# Patient Record
Sex: Female | Born: 1980 | Race: White | Hispanic: No | Smoking: Former smoker
Health system: Southern US, Community
[De-identification: ages and names within clinical notes are randomized; demographics above are authoritative.]

## PROBLEM LIST (undated history)

## (undated) DIAGNOSIS — J40 Bronchitis, not specified as acute or chronic: Secondary | ICD-10-CM

## (undated) DIAGNOSIS — E039 Hypothyroidism, unspecified: Secondary | ICD-10-CM

## (undated) DIAGNOSIS — F329 Major depressive disorder, single episode, unspecified: Secondary | ICD-10-CM

## (undated) DIAGNOSIS — T7840XA Allergy, unspecified, initial encounter: Secondary | ICD-10-CM

## (undated) DIAGNOSIS — R112 Nausea with vomiting, unspecified: Secondary | ICD-10-CM

## (undated) DIAGNOSIS — L309 Dermatitis, unspecified: Secondary | ICD-10-CM

## (undated) DIAGNOSIS — N289 Disorder of kidney and ureter, unspecified: Secondary | ICD-10-CM

## (undated) DIAGNOSIS — E162 Hypoglycemia, unspecified: Secondary | ICD-10-CM

## (undated) DIAGNOSIS — I499 Cardiac arrhythmia, unspecified: Secondary | ICD-10-CM

## (undated) DIAGNOSIS — Z9889 Other specified postprocedural states: Secondary | ICD-10-CM

## (undated) DIAGNOSIS — F32A Depression, unspecified: Secondary | ICD-10-CM

## (undated) DIAGNOSIS — Z8489 Family history of other specified conditions: Secondary | ICD-10-CM

## (undated) HISTORY — PX: ABDOMINAL HYSTERECTOMY: SHX81

## (undated) HISTORY — DX: Dermatitis, unspecified: L30.9

## (undated) HISTORY — PX: CHOLECYSTECTOMY: SHX55

## (undated) HISTORY — DX: Allergy, unspecified, initial encounter: T78.40XA

## (undated) HISTORY — PX: TONSILLECTOMY: SUR1361

## (undated) HISTORY — PX: TONSILLECTOMY AND ADENOIDECTOMY: SUR1326

## (undated) HISTORY — PX: TYMPANOSTOMY TUBE PLACEMENT: SHX32

## (undated) HISTORY — DX: Hypothyroidism, unspecified: E03.9

## (undated) HISTORY — DX: Depression, unspecified: F32.A

## (undated) HISTORY — PX: ADENOIDECTOMY: SUR15

---
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
## 1999-08-13 ENCOUNTER — Encounter: Payer: Self-pay | Admitting: Specialist

## 1999-08-13 ENCOUNTER — Ambulatory Visit (HOSPITAL_COMMUNITY): Admission: RE | Admit: 1999-08-13 | Discharge: 1999-08-13 | Payer: Self-pay | Admitting: Specialist

## 2000-11-19 ENCOUNTER — Inpatient Hospital Stay (HOSPITAL_COMMUNITY): Admission: AD | Admit: 2000-11-19 | Discharge: 2000-11-19 | Payer: Self-pay | Admitting: Obstetrics

## 2001-02-08 ENCOUNTER — Inpatient Hospital Stay (HOSPITAL_COMMUNITY): Admission: AD | Admit: 2001-02-08 | Discharge: 2001-02-10 | Payer: Self-pay | Admitting: Obstetrics & Gynecology

## 2001-02-09 ENCOUNTER — Encounter: Payer: Self-pay | Admitting: *Deleted

## 2001-03-10 ENCOUNTER — Inpatient Hospital Stay (HOSPITAL_COMMUNITY): Admission: AD | Admit: 2001-03-10 | Discharge: 2001-03-10 | Payer: Self-pay | Admitting: Obstetrics

## 2002-01-11 ENCOUNTER — Emergency Department (HOSPITAL_COMMUNITY): Admission: EM | Admit: 2002-01-11 | Discharge: 2002-01-11 | Payer: Self-pay | Admitting: Emergency Medicine

## 2002-09-03 ENCOUNTER — Other Ambulatory Visit: Admission: RE | Admit: 2002-09-03 | Discharge: 2002-09-03 | Payer: Self-pay | Admitting: Obstetrics and Gynecology

## 2002-10-17 ENCOUNTER — Ambulatory Visit (HOSPITAL_COMMUNITY): Admission: RE | Admit: 2002-10-17 | Discharge: 2002-10-17 | Payer: Self-pay | Admitting: Obstetrics and Gynecology

## 2002-10-17 ENCOUNTER — Encounter: Payer: Self-pay | Admitting: Obstetrics and Gynecology

## 2002-11-04 ENCOUNTER — Inpatient Hospital Stay (HOSPITAL_COMMUNITY): Admission: AD | Admit: 2002-11-04 | Discharge: 2002-11-04 | Payer: Self-pay | Admitting: Obstetrics and Gynecology

## 2002-11-09 ENCOUNTER — Inpatient Hospital Stay (HOSPITAL_COMMUNITY): Admission: AD | Admit: 2002-11-09 | Discharge: 2002-11-09 | Payer: Self-pay | Admitting: Obstetrics and Gynecology

## 2002-12-07 ENCOUNTER — Inpatient Hospital Stay (HOSPITAL_COMMUNITY): Admission: AD | Admit: 2002-12-07 | Discharge: 2002-12-08 | Payer: Self-pay | Admitting: Obstetrics and Gynecology

## 2002-12-26 ENCOUNTER — Inpatient Hospital Stay (HOSPITAL_COMMUNITY): Admission: AD | Admit: 2002-12-26 | Discharge: 2002-12-26 | Payer: Self-pay | Admitting: Obstetrics and Gynecology

## 2002-12-27 ENCOUNTER — Inpatient Hospital Stay (HOSPITAL_COMMUNITY): Admission: AD | Admit: 2002-12-27 | Discharge: 2002-12-27 | Payer: Self-pay | Admitting: Obstetrics and Gynecology

## 2003-01-02 ENCOUNTER — Inpatient Hospital Stay (HOSPITAL_COMMUNITY): Admission: AD | Admit: 2003-01-02 | Discharge: 2003-01-02 | Payer: Self-pay | Admitting: Obstetrics and Gynecology

## 2003-01-09 ENCOUNTER — Inpatient Hospital Stay (HOSPITAL_COMMUNITY): Admission: AD | Admit: 2003-01-09 | Discharge: 2003-01-09 | Payer: Self-pay | Admitting: Obstetrics and Gynecology

## 2003-01-12 ENCOUNTER — Inpatient Hospital Stay (HOSPITAL_COMMUNITY): Admission: AD | Admit: 2003-01-12 | Discharge: 2003-01-12 | Payer: Self-pay | Admitting: Obstetrics and Gynecology

## 2003-01-19 ENCOUNTER — Inpatient Hospital Stay (HOSPITAL_COMMUNITY): Admission: AD | Admit: 2003-01-19 | Discharge: 2003-01-19 | Payer: Self-pay | Admitting: Obstetrics and Gynecology

## 2003-01-23 ENCOUNTER — Inpatient Hospital Stay (HOSPITAL_COMMUNITY): Admission: AD | Admit: 2003-01-23 | Discharge: 2003-01-23 | Payer: Self-pay | Admitting: Obstetrics and Gynecology

## 2003-01-28 ENCOUNTER — Inpatient Hospital Stay (HOSPITAL_COMMUNITY): Admission: AD | Admit: 2003-01-28 | Discharge: 2003-01-28 | Payer: Self-pay | Admitting: Obstetrics and Gynecology

## 2003-02-03 ENCOUNTER — Inpatient Hospital Stay (HOSPITAL_COMMUNITY): Admission: AD | Admit: 2003-02-03 | Discharge: 2003-02-03 | Payer: Self-pay | Admitting: Obstetrics and Gynecology

## 2003-02-04 ENCOUNTER — Inpatient Hospital Stay (HOSPITAL_COMMUNITY): Admission: AD | Admit: 2003-02-04 | Discharge: 2003-02-04 | Payer: Self-pay | Admitting: Obstetrics and Gynecology

## 2003-02-06 ENCOUNTER — Inpatient Hospital Stay (HOSPITAL_COMMUNITY): Admission: AD | Admit: 2003-02-06 | Discharge: 2003-02-06 | Payer: Self-pay | Admitting: Obstetrics and Gynecology

## 2003-02-26 ENCOUNTER — Inpatient Hospital Stay (HOSPITAL_COMMUNITY): Admission: AD | Admit: 2003-02-26 | Discharge: 2003-02-26 | Payer: Self-pay | Admitting: Obstetrics and Gynecology

## 2003-03-02 ENCOUNTER — Inpatient Hospital Stay (HOSPITAL_COMMUNITY): Admission: AD | Admit: 2003-03-02 | Discharge: 2003-03-02 | Payer: Self-pay | Admitting: Obstetrics and Gynecology

## 2003-03-09 ENCOUNTER — Ambulatory Visit (HOSPITAL_COMMUNITY): Admission: RE | Admit: 2003-03-09 | Discharge: 2003-03-09 | Payer: Self-pay | Admitting: Obstetrics and Gynecology

## 2003-03-10 ENCOUNTER — Inpatient Hospital Stay (HOSPITAL_COMMUNITY): Admission: AD | Admit: 2003-03-10 | Discharge: 2003-03-12 | Payer: Self-pay | Admitting: Obstetrics and Gynecology

## 2003-03-15 ENCOUNTER — Encounter: Admission: RE | Admit: 2003-03-15 | Discharge: 2003-04-14 | Payer: Self-pay | Admitting: Obstetrics and Gynecology

## 2003-03-17 ENCOUNTER — Emergency Department (HOSPITAL_COMMUNITY): Admission: EM | Admit: 2003-03-17 | Discharge: 2003-03-18 | Payer: Self-pay | Admitting: Emergency Medicine

## 2003-03-22 ENCOUNTER — Emergency Department (HOSPITAL_COMMUNITY): Admission: EM | Admit: 2003-03-22 | Discharge: 2003-03-22 | Payer: Self-pay | Admitting: Emergency Medicine

## 2003-03-25 ENCOUNTER — Ambulatory Visit (HOSPITAL_COMMUNITY): Admission: RE | Admit: 2003-03-25 | Discharge: 2003-03-25 | Payer: Self-pay | Admitting: Obstetrics and Gynecology

## 2003-11-06 ENCOUNTER — Other Ambulatory Visit: Admission: RE | Admit: 2003-11-06 | Discharge: 2003-11-06 | Payer: Self-pay | Admitting: Obstetrics and Gynecology

## 2004-03-06 ENCOUNTER — Emergency Department (HOSPITAL_COMMUNITY): Admission: EM | Admit: 2004-03-06 | Discharge: 2004-03-07 | Payer: Self-pay | Admitting: Emergency Medicine

## 2004-03-07 ENCOUNTER — Encounter (INDEPENDENT_AMBULATORY_CARE_PROVIDER_SITE_OTHER): Payer: Self-pay | Admitting: *Deleted

## 2004-06-21 ENCOUNTER — Other Ambulatory Visit: Admission: RE | Admit: 2004-06-21 | Discharge: 2004-06-21 | Payer: Self-pay | Admitting: Obstetrics and Gynecology

## 2004-07-14 ENCOUNTER — Emergency Department (HOSPITAL_COMMUNITY): Admission: EM | Admit: 2004-07-14 | Discharge: 2004-07-14 | Payer: Self-pay | Admitting: Emergency Medicine

## 2004-08-29 ENCOUNTER — Emergency Department (HOSPITAL_COMMUNITY): Admission: EM | Admit: 2004-08-29 | Discharge: 2004-08-30 | Payer: Self-pay | Admitting: Emergency Medicine

## 2004-08-31 ENCOUNTER — Ambulatory Visit: Payer: Self-pay | Admitting: Internal Medicine

## 2004-12-01 ENCOUNTER — Other Ambulatory Visit: Admission: RE | Admit: 2004-12-01 | Discharge: 2004-12-01 | Payer: Self-pay | Admitting: Obstetrics and Gynecology

## 2005-01-11 ENCOUNTER — Emergency Department (HOSPITAL_COMMUNITY): Admission: EM | Admit: 2005-01-11 | Discharge: 2005-01-11 | Payer: Self-pay | Admitting: Family Medicine

## 2005-01-31 ENCOUNTER — Ambulatory Visit: Payer: Self-pay | Admitting: Internal Medicine

## 2005-07-24 ENCOUNTER — Ambulatory Visit (HOSPITAL_COMMUNITY): Admission: RE | Admit: 2005-07-24 | Discharge: 2005-07-24 | Payer: Self-pay | Admitting: Internal Medicine

## 2005-12-05 ENCOUNTER — Other Ambulatory Visit: Admission: RE | Admit: 2005-12-05 | Discharge: 2005-12-05 | Payer: Self-pay | Admitting: Obstetrics and Gynecology

## 2006-01-22 ENCOUNTER — Ambulatory Visit: Payer: Self-pay | Admitting: Internal Medicine

## 2006-06-05 ENCOUNTER — Encounter (INDEPENDENT_AMBULATORY_CARE_PROVIDER_SITE_OTHER): Payer: Self-pay | Admitting: *Deleted

## 2006-06-05 ENCOUNTER — Inpatient Hospital Stay (HOSPITAL_COMMUNITY): Admission: AD | Admit: 2006-06-05 | Discharge: 2006-06-06 | Payer: Self-pay | Admitting: Obstetrics and Gynecology

## 2006-06-08 ENCOUNTER — Encounter: Payer: Self-pay | Admitting: Obstetrics and Gynecology

## 2006-06-08 ENCOUNTER — Emergency Department (HOSPITAL_COMMUNITY): Admission: EM | Admit: 2006-06-08 | Discharge: 2006-06-08 | Payer: Self-pay | Admitting: Emergency Medicine

## 2006-08-19 ENCOUNTER — Inpatient Hospital Stay (HOSPITAL_COMMUNITY): Admission: AD | Admit: 2006-08-19 | Discharge: 2006-08-19 | Payer: Self-pay | Admitting: Obstetrics and Gynecology

## 2006-09-21 ENCOUNTER — Inpatient Hospital Stay (HOSPITAL_COMMUNITY): Admission: AD | Admit: 2006-09-21 | Discharge: 2006-09-21 | Payer: Self-pay | Admitting: Obstetrics and Gynecology

## 2006-09-24 ENCOUNTER — Inpatient Hospital Stay (HOSPITAL_COMMUNITY): Admission: AD | Admit: 2006-09-24 | Discharge: 2006-09-25 | Payer: Self-pay | Admitting: Obstetrics and Gynecology

## 2006-10-01 ENCOUNTER — Inpatient Hospital Stay (HOSPITAL_COMMUNITY): Admission: AD | Admit: 2006-10-01 | Discharge: 2006-10-02 | Payer: Self-pay | Admitting: Obstetrics and Gynecology

## 2006-10-01 ENCOUNTER — Inpatient Hospital Stay (HOSPITAL_COMMUNITY): Admission: AD | Admit: 2006-10-01 | Discharge: 2006-10-01 | Payer: Self-pay | Admitting: Obstetrics and Gynecology

## 2006-10-05 ENCOUNTER — Inpatient Hospital Stay (HOSPITAL_COMMUNITY): Admission: AD | Admit: 2006-10-05 | Discharge: 2006-10-05 | Payer: Self-pay | Admitting: Obstetrics and Gynecology

## 2006-10-16 ENCOUNTER — Inpatient Hospital Stay (HOSPITAL_COMMUNITY): Admission: RE | Admit: 2006-10-16 | Discharge: 2006-10-18 | Payer: Self-pay | Admitting: Obstetrics and Gynecology

## 2006-10-21 ENCOUNTER — Inpatient Hospital Stay (HOSPITAL_COMMUNITY): Admission: AD | Admit: 2006-10-21 | Discharge: 2006-10-22 | Payer: Self-pay | Admitting: Obstetrics and Gynecology

## 2006-10-23 ENCOUNTER — Encounter (INDEPENDENT_AMBULATORY_CARE_PROVIDER_SITE_OTHER): Payer: Self-pay | Admitting: *Deleted

## 2006-10-23 ENCOUNTER — Inpatient Hospital Stay (HOSPITAL_COMMUNITY): Admission: AD | Admit: 2006-10-23 | Discharge: 2006-10-24 | Payer: Self-pay | Admitting: Obstetrics and Gynecology

## 2007-01-01 ENCOUNTER — Encounter: Payer: Self-pay | Admitting: Internal Medicine

## 2007-01-01 ENCOUNTER — Ambulatory Visit: Payer: Self-pay | Admitting: Internal Medicine

## 2007-01-01 DIAGNOSIS — J45909 Unspecified asthma, uncomplicated: Secondary | ICD-10-CM | POA: Insufficient documentation

## 2007-01-01 DIAGNOSIS — G43009 Migraine without aura, not intractable, without status migrainosus: Secondary | ICD-10-CM | POA: Insufficient documentation

## 2007-01-01 DIAGNOSIS — F329 Major depressive disorder, single episode, unspecified: Secondary | ICD-10-CM | POA: Insufficient documentation

## 2007-01-01 DIAGNOSIS — F411 Generalized anxiety disorder: Secondary | ICD-10-CM | POA: Insufficient documentation

## 2007-01-01 DIAGNOSIS — F3289 Other specified depressive episodes: Secondary | ICD-10-CM | POA: Insufficient documentation

## 2007-01-01 DIAGNOSIS — D649 Anemia, unspecified: Secondary | ICD-10-CM | POA: Insufficient documentation

## 2007-01-01 DIAGNOSIS — R55 Syncope and collapse: Secondary | ICD-10-CM | POA: Insufficient documentation

## 2007-01-15 ENCOUNTER — Ambulatory Visit: Payer: Self-pay

## 2007-01-15 ENCOUNTER — Encounter: Payer: Self-pay | Admitting: Internal Medicine

## 2007-01-17 ENCOUNTER — Telehealth (INDEPENDENT_AMBULATORY_CARE_PROVIDER_SITE_OTHER): Payer: Self-pay | Admitting: *Deleted

## 2007-03-06 ENCOUNTER — Telehealth (INDEPENDENT_AMBULATORY_CARE_PROVIDER_SITE_OTHER): Payer: Self-pay | Admitting: *Deleted

## 2007-03-11 ENCOUNTER — Emergency Department (HOSPITAL_COMMUNITY): Admission: EM | Admit: 2007-03-11 | Discharge: 2007-03-11 | Payer: Self-pay | Admitting: Emergency Medicine

## 2007-05-27 ENCOUNTER — Emergency Department (HOSPITAL_COMMUNITY): Admission: EM | Admit: 2007-05-27 | Discharge: 2007-05-27 | Payer: Self-pay | Admitting: Emergency Medicine

## 2007-05-29 ENCOUNTER — Ambulatory Visit: Payer: Self-pay | Admitting: Internal Medicine

## 2007-05-29 DIAGNOSIS — G4700 Insomnia, unspecified: Secondary | ICD-10-CM | POA: Insufficient documentation

## 2007-05-29 DIAGNOSIS — G4710 Hypersomnia, unspecified: Secondary | ICD-10-CM | POA: Insufficient documentation

## 2007-05-29 LAB — CONVERTED CEMR LAB
Basophils Relative: 0.8 % (ref 0.0–1.0)
Bilirubin Urine: NEGATIVE
Bilirubin, Direct: 0.1 mg/dL (ref 0.0–0.3)
CO2: 30 meq/L (ref 19–32)
Cholesterol: 196 mg/dL (ref 0–200)
Eosinophils Relative: 2.1 % (ref 0.0–5.0)
GFR calc Af Amer: 129 mL/min
Glucose, Bld: 107 mg/dL — ABNORMAL HIGH (ref 70–99)
HDL: 37.5 mg/dL — ABNORMAL LOW (ref >39.0)
Hemoglobin: 13.1 g/dL (ref 12.0–15.0)
Leukocytes, UA: NEGATIVE
Lymphocytes Relative: 28 % (ref 12.0–46.0)
MCV: 89.9 fL (ref 78.0–100.0)
Monocytes Absolute: 0.5 10*3/uL (ref 0.2–0.7)
Monocytes Relative: 6.1 % (ref 3.0–11.0)
Neutro Abs: 5 10*3/uL (ref 1.4–7.7)
Potassium: 4 meq/L (ref 3.5–5.1)
Specific Gravity, Urine: 1.02 (ref 1.000–1.03)
TSH: 2.22 microintl units/mL (ref 0.35–5.50)
Total Protein, Urine: NEGATIVE mg/dL
Total Protein: 7.5 g/dL (ref 6.0–8.3)
Triglycerides: 196 mg/dL — ABNORMAL HIGH (ref 0–149)
WBC: 8 10*3/uL (ref 4.5–10.5)

## 2007-06-11 ENCOUNTER — Ambulatory Visit: Payer: Self-pay | Admitting: Pulmonary Disease

## 2007-06-15 ENCOUNTER — Emergency Department (HOSPITAL_COMMUNITY): Admission: EM | Admit: 2007-06-15 | Discharge: 2007-06-15 | Payer: Self-pay | Admitting: Emergency Medicine

## 2007-06-17 ENCOUNTER — Encounter: Payer: Self-pay | Admitting: Internal Medicine

## 2007-06-18 ENCOUNTER — Ambulatory Visit (HOSPITAL_COMMUNITY): Admission: RE | Admit: 2007-06-18 | Discharge: 2007-06-18 | Payer: Self-pay | Admitting: Obstetrics and Gynecology

## 2007-07-01 ENCOUNTER — Telehealth: Payer: Self-pay | Admitting: Pulmonary Disease

## 2007-10-03 ENCOUNTER — Emergency Department (HOSPITAL_COMMUNITY): Admission: EM | Admit: 2007-10-03 | Discharge: 2007-10-04 | Payer: Self-pay | Admitting: Emergency Medicine

## 2007-10-04 ENCOUNTER — Telehealth: Payer: Self-pay | Admitting: Internal Medicine

## 2007-10-07 ENCOUNTER — Ambulatory Visit: Payer: Self-pay | Admitting: Cardiovascular Disease

## 2007-10-07 ENCOUNTER — Ambulatory Visit: Payer: Self-pay

## 2007-10-22 ENCOUNTER — Encounter: Payer: Self-pay | Admitting: Cardiovascular Disease

## 2007-10-22 ENCOUNTER — Ambulatory Visit: Payer: Self-pay

## 2008-01-22 ENCOUNTER — Ambulatory Visit: Payer: Self-pay | Admitting: Internal Medicine

## 2008-01-22 DIAGNOSIS — R030 Elevated blood-pressure reading, without diagnosis of hypertension: Secondary | ICD-10-CM | POA: Insufficient documentation

## 2008-01-22 DIAGNOSIS — N3000 Acute cystitis without hematuria: Secondary | ICD-10-CM | POA: Insufficient documentation

## 2008-01-22 DIAGNOSIS — J209 Acute bronchitis, unspecified: Secondary | ICD-10-CM | POA: Insufficient documentation

## 2008-01-22 DIAGNOSIS — M549 Dorsalgia, unspecified: Secondary | ICD-10-CM | POA: Insufficient documentation

## 2008-02-10 ENCOUNTER — Telehealth (INDEPENDENT_AMBULATORY_CARE_PROVIDER_SITE_OTHER): Payer: Self-pay | Admitting: *Deleted

## 2008-02-28 ENCOUNTER — Ambulatory Visit (HOSPITAL_COMMUNITY): Admission: RE | Admit: 2008-02-28 | Discharge: 2008-02-28 | Payer: Self-pay | Admitting: Obstetrics and Gynecology

## 2008-06-11 ENCOUNTER — Ambulatory Visit (HOSPITAL_COMMUNITY): Admission: RE | Admit: 2008-06-11 | Discharge: 2008-06-12 | Payer: Self-pay | Admitting: Obstetrics and Gynecology

## 2008-06-11 ENCOUNTER — Encounter (INDEPENDENT_AMBULATORY_CARE_PROVIDER_SITE_OTHER): Payer: Self-pay | Admitting: Obstetrics and Gynecology

## 2008-07-13 ENCOUNTER — Emergency Department (HOSPITAL_COMMUNITY): Admission: EM | Admit: 2008-07-13 | Discharge: 2008-07-13 | Payer: Self-pay | Admitting: Emergency Medicine

## 2008-11-02 ENCOUNTER — Emergency Department (HOSPITAL_COMMUNITY): Admission: EM | Admit: 2008-11-02 | Discharge: 2008-11-03 | Payer: Self-pay | Admitting: Emergency Medicine

## 2008-12-23 ENCOUNTER — Telehealth (INDEPENDENT_AMBULATORY_CARE_PROVIDER_SITE_OTHER): Payer: Self-pay | Admitting: *Deleted

## 2008-12-23 ENCOUNTER — Ambulatory Visit: Payer: Self-pay | Admitting: Internal Medicine

## 2008-12-23 DIAGNOSIS — IMO0002 Reserved for concepts with insufficient information to code with codable children: Secondary | ICD-10-CM | POA: Insufficient documentation

## 2008-12-23 LAB — CONVERTED CEMR LAB
Blood in Urine, dipstick: NEGATIVE
Ketones, urine, test strip: NEGATIVE
Nitrite: NEGATIVE

## 2008-12-25 ENCOUNTER — Encounter: Admission: RE | Admit: 2008-12-25 | Discharge: 2008-12-25 | Payer: Self-pay | Admitting: Internal Medicine

## 2009-01-01 ENCOUNTER — Encounter: Payer: Self-pay | Admitting: Internal Medicine

## 2009-01-03 ENCOUNTER — Encounter: Admission: RE | Admit: 2009-01-03 | Discharge: 2009-01-03 | Payer: Self-pay | Admitting: Neurosurgery

## 2009-01-26 ENCOUNTER — Encounter: Payer: Self-pay | Admitting: Internal Medicine

## 2009-02-18 ENCOUNTER — Telehealth: Payer: Self-pay | Admitting: Internal Medicine

## 2009-02-18 ENCOUNTER — Emergency Department (HOSPITAL_COMMUNITY): Admission: EM | Admit: 2009-02-18 | Discharge: 2009-02-19 | Payer: Self-pay | Admitting: Emergency Medicine

## 2009-02-18 DIAGNOSIS — Z87448 Personal history of other diseases of urinary system: Secondary | ICD-10-CM | POA: Insufficient documentation

## 2009-02-18 DIAGNOSIS — Z87898 Personal history of other specified conditions: Secondary | ICD-10-CM | POA: Insufficient documentation

## 2009-02-18 DIAGNOSIS — N1330 Unspecified hydronephrosis: Secondary | ICD-10-CM | POA: Insufficient documentation

## 2009-02-19 ENCOUNTER — Telehealth: Payer: Self-pay | Admitting: Internal Medicine

## 2009-02-19 ENCOUNTER — Ambulatory Visit: Payer: Self-pay | Admitting: Internal Medicine

## 2009-02-19 DIAGNOSIS — R197 Diarrhea, unspecified: Secondary | ICD-10-CM | POA: Insufficient documentation

## 2009-02-23 ENCOUNTER — Encounter: Payer: Self-pay | Admitting: Internal Medicine

## 2009-02-25 ENCOUNTER — Telehealth: Payer: Self-pay | Admitting: Internal Medicine

## 2009-02-25 LAB — CONVERTED CEMR LAB: Sed Rate: 21 mm/hr (ref 0–22)

## 2009-03-01 ENCOUNTER — Telehealth: Payer: Self-pay | Admitting: Internal Medicine

## 2009-03-03 ENCOUNTER — Telehealth: Payer: Self-pay | Admitting: Internal Medicine

## 2009-03-04 ENCOUNTER — Ambulatory Visit: Payer: Self-pay | Admitting: Internal Medicine

## 2009-03-08 ENCOUNTER — Encounter: Payer: Self-pay | Admitting: Internal Medicine

## 2009-03-17 ENCOUNTER — Encounter: Payer: Self-pay | Admitting: Internal Medicine

## 2009-03-23 ENCOUNTER — Ambulatory Visit: Payer: Self-pay | Admitting: Internal Medicine

## 2009-03-23 DIAGNOSIS — R062 Wheezing: Secondary | ICD-10-CM | POA: Insufficient documentation

## 2009-03-24 ENCOUNTER — Telehealth: Payer: Self-pay | Admitting: Internal Medicine

## 2009-04-26 ENCOUNTER — Telehealth: Payer: Self-pay | Admitting: Internal Medicine

## 2009-04-26 ENCOUNTER — Ambulatory Visit: Payer: Self-pay | Admitting: Internal Medicine

## 2009-04-26 DIAGNOSIS — R51 Headache: Secondary | ICD-10-CM | POA: Insufficient documentation

## 2009-04-26 DIAGNOSIS — R519 Headache, unspecified: Secondary | ICD-10-CM | POA: Insufficient documentation

## 2009-04-28 ENCOUNTER — Telehealth: Payer: Self-pay | Admitting: Internal Medicine

## 2009-04-28 DIAGNOSIS — R209 Unspecified disturbances of skin sensation: Secondary | ICD-10-CM | POA: Insufficient documentation

## 2009-04-28 DIAGNOSIS — R259 Unspecified abnormal involuntary movements: Secondary | ICD-10-CM | POA: Insufficient documentation

## 2009-04-28 DIAGNOSIS — M79609 Pain in unspecified limb: Secondary | ICD-10-CM | POA: Insufficient documentation

## 2009-05-05 ENCOUNTER — Encounter (INDEPENDENT_AMBULATORY_CARE_PROVIDER_SITE_OTHER): Payer: Self-pay | Admitting: *Deleted

## 2009-05-08 ENCOUNTER — Ambulatory Visit (HOSPITAL_COMMUNITY): Admission: RE | Admit: 2009-05-08 | Discharge: 2009-05-08 | Payer: Self-pay | Admitting: Internal Medicine

## 2009-05-13 ENCOUNTER — Telehealth: Payer: Self-pay | Admitting: Internal Medicine

## 2009-05-29 ENCOUNTER — Emergency Department (HOSPITAL_COMMUNITY): Admission: EM | Admit: 2009-05-29 | Discharge: 2009-05-30 | Payer: Self-pay | Admitting: Emergency Medicine

## 2009-06-08 ENCOUNTER — Encounter: Payer: Self-pay | Admitting: Internal Medicine

## 2009-06-15 ENCOUNTER — Ambulatory Visit: Payer: Self-pay | Admitting: Internal Medicine

## 2009-07-15 ENCOUNTER — Telehealth (INDEPENDENT_AMBULATORY_CARE_PROVIDER_SITE_OTHER): Payer: Self-pay | Admitting: *Deleted

## 2009-07-18 HISTORY — PX: BACK SURGERY: SHX140

## 2009-07-19 ENCOUNTER — Inpatient Hospital Stay (HOSPITAL_COMMUNITY): Admission: RE | Admit: 2009-07-19 | Discharge: 2009-07-21 | Payer: Self-pay | Admitting: Neurosurgery

## 2009-07-27 ENCOUNTER — Emergency Department (HOSPITAL_COMMUNITY): Admission: EM | Admit: 2009-07-27 | Discharge: 2009-07-28 | Payer: Self-pay | Admitting: Emergency Medicine

## 2009-07-29 ENCOUNTER — Encounter: Admission: RE | Admit: 2009-07-29 | Discharge: 2009-07-29 | Payer: Self-pay | Admitting: Neurosurgery

## 2009-07-30 ENCOUNTER — Inpatient Hospital Stay (HOSPITAL_COMMUNITY): Admission: EM | Admit: 2009-07-30 | Discharge: 2009-08-02 | Payer: Self-pay | Admitting: Emergency Medicine

## 2009-08-13 ENCOUNTER — Inpatient Hospital Stay (HOSPITAL_COMMUNITY): Admission: EM | Admit: 2009-08-13 | Discharge: 2009-08-14 | Payer: Self-pay | Admitting: Emergency Medicine

## 2009-10-13 ENCOUNTER — Encounter: Admission: RE | Admit: 2009-10-13 | Discharge: 2009-10-13 | Payer: Self-pay | Admitting: Neurosurgery

## 2009-11-10 ENCOUNTER — Emergency Department (HOSPITAL_COMMUNITY): Admission: EM | Admit: 2009-11-10 | Discharge: 2009-11-11 | Payer: Self-pay | Admitting: Emergency Medicine

## 2009-11-12 ENCOUNTER — Telehealth: Payer: Self-pay | Admitting: Internal Medicine

## 2009-12-09 ENCOUNTER — Telehealth (INDEPENDENT_AMBULATORY_CARE_PROVIDER_SITE_OTHER): Payer: Self-pay

## 2010-01-07 ENCOUNTER — Emergency Department (HOSPITAL_COMMUNITY): Admission: EM | Admit: 2010-01-07 | Discharge: 2010-01-07 | Payer: Self-pay | Admitting: Emergency Medicine

## 2010-03-17 ENCOUNTER — Emergency Department (HOSPITAL_COMMUNITY)
Admission: EM | Admit: 2010-03-17 | Discharge: 2010-03-17 | Payer: Self-pay | Source: Home / Self Care | Admitting: Emergency Medicine

## 2010-03-18 ENCOUNTER — Ambulatory Visit
Admission: RE | Admit: 2010-03-18 | Discharge: 2010-03-18 | Payer: Self-pay | Source: Home / Self Care | Attending: Internal Medicine | Admitting: Internal Medicine

## 2010-03-18 DIAGNOSIS — I959 Hypotension, unspecified: Secondary | ICD-10-CM | POA: Insufficient documentation

## 2010-04-19 NOTE — Progress Notes (Signed)
Summary: Side effects  Phone Note Call from Patient Call back at [phone]   Summary of Call: Patient left message on triage A that she was seen recently and is not doing better. Patient now has tingling in the Right side of her face, right arm pain and involuntary muscle jerks. Please advise. Initial call taken by: Lucious Groves,  April 28, 2009 9:19 AM  Follow-up for Phone Call        will still need to get the MRI results., but I can also refer to neurology as well  Follow-up by: Corwin Levins MD,  April 28, 2009 12:36 PM  Additional Follow-up for Phone Call Additional follow up Details #1::        Patient notified, will wait for call from [facility] and call back if symptoms get any worse. Additional Follow-up by: Lucious Groves,  April 28, 2009 2:04 PM  New Problems: ABNORMAL INVOLUNTARY MOVEMENTS (ICD-781.0) ARM PAIN (ICD-729.5) FACIAL PARESTHESIA (ICD-782.0)   New Problems: ABNORMAL INVOLUNTARY MOVEMENTS (ICD-781.0) ARM PAIN (ICD-729.5) FACIAL PARESTHESIA (ICD-782.0)

## 2010-04-19 NOTE — Assessment & Plan Note (Signed)
Summary: migraine x 4 days / SD   Vital Signs:  Patient profile:   30 year old female Height:      68 inches Weight:      222 pounds BMI:     33.88 O2 Sat:      98 % on Room air Temp:     97.4 degrees F oral Pulse rate:   105 / minute BP sitting:   118 / 80  (left arm) Cuff size:   large  Vitals Entered ByZella Ball Ewing (April 26, 2009 3:03 PM)  O2 Flow:  Room air CC: Migraine, nausea /RE   Primary Care Provider:  Bayard Males  CC:  Migraine and nausea /RE.  History of Present Illness: here with 4 days onset right side headache without her typical aura prior, but assoc with photophobia, severe pain, throbbing and nausea (no vomiting), but also with transient left upper eyelid weakness that seemed to resolve , then RUE weakness that is new, mild and most evident when she tries to pick up her purse;  no fever , trauma, night sweats, change in back or neck pain; vision loss, and Pt denies CP, sob, doe, wheezing, orthopnea, pnd, worsening LE edema, palps, dizziness or syncope No falls or worsening LE weakness, though she feels she has some mild chronic on the RLE due to lumbar disc disease.   Problems Prior to Update: 1)  Headache  (ICD-784.0) 2)  Wheezing  (ICD-786.07) 3)  Diarrhea, Acute, Chronic  (ICD-787.91) 4)  Hx of Hydronephrosis, Right  (ICD-591) 5)  Migraines, Hx of  (ICD-V13.8) 6)  Pyelonephritis, Hx of  (ICD-V13.00) 7)  Lumbar Radiculopathy, Right  (ICD-724.4) 8)  Elevated Blood Pressure Without Diagnosis of Hypertension  (ICD-796.2) 9)  Back Pain  (ICD-724.5) 10)  Asthmatic Bronchitis, Acute  (ICD-466.0) 11)  Obesity, Morbid  (ICD-278.01) 12)  Preventive Health Care  (ICD-V70.0) 13)  Hypersomnia  (ICD-780.54) 14)  Insomnia-sleep Disorder-unspec  (ICD-780.52) 15)  Syncope  (ICD-780.2) 16)  Family History Diabetes 1st Degree Relative  (ICD-V18.0) 17)  Depression  (ICD-311) 18)  Asthma  (ICD-493.90) 19)  Anemia-nos  (ICD-285.9) 20)  Common Migraine   (ICD-346.10) 21)  Anxiety  (ICD-300.00)  Medications Prior to Update: 1)  Alprazolam 0.5 Mg  Tabs (Alprazolam) .Marland Kitchen.. 1 By Mouth Once Daily As Needed 2)  Midrin 325-65-100 Mg  Caps (Apap-Isometheptene-Dichloral) .Marland Kitchen.. 1 By Mouth Two Times A Day Prn 3)  Proair Hfa 108 (90 Base) Mcg/act Aers (Albuterol Sulfate) .... 2 Puffs Qid As Needed 4)  Hydrocodone-Acetaminophen 5-500 Mg Tabs (Hydrocodone-Acetaminophen) .... As Needed 5)  Wellbutrin Xl 300 Mg Xr24h-Tab (Bupropion Hcl) .... Once Daily 6)  Flexeril 5 Mg Tabs (Cyclobenzaprine Hcl) .Marland Kitchen.. 1 By Mouth Three Times A Day As Needed 7)  Bentyl 20 Mg Tabs (Dicyclomine Hcl) .... Take 1 Tablet By Mouth Three Times A Day 8)  Anusol-Hc 25 Mg Supp (Hydrocortisone Acetate) .... One Rectally One At Bedtime 9)  Azithromycin 250 Mg Tabs (Azithromycin) .... 2po Qd For 1 Day, Then 1po Qd For 4days, Then Stop 10)  Hydrocodone-Homatropine 5-1.5 Mg/62ml Syrp (Hydrocodone-Homatropine) .Marland Kitchen.. 1 Tsp By Mouth Q 6 Hrs As Needed Cough 11)  Prednisone 10 Mg Tabs (Prednisone) .... 3po Qd For 3days, Then 2po Qd For 3days, Then 1po Qd For 3days, Then Stop 12)  Albuterol Sulfate 0.63 Mg/41ml Nebu (Albuterol Sulfate) .... Use Asd Hhn Qid As Needed  Current Medications (verified): 1)  Alprazolam 0.5 Mg  Tabs (Alprazolam) .Marland Kitchen.. 1 By Mouth Once Daily  As Needed 2)  Midrin 325-65-100 Mg  Caps (Apap-Isometheptene-Dichloral) .Marland Kitchen.. 1 By Mouth Two Times A Day Prn 3)  Proair Hfa 108 (90 Base) Mcg/act Aers (Albuterol Sulfate) .... 2 Puffs Qid As Needed 4)  Hydrocodone-Acetaminophen 5-500 Mg Tabs (Hydrocodone-Acetaminophen) .... As Needed 5)  Wellbutrin Xl 300 Mg Xr24h-Tab (Bupropion Hcl) .... Once Daily 6)  Flexeril 5 Mg Tabs (Cyclobenzaprine Hcl) .Marland Kitchen.. 1 By Mouth Three Times A Day As Needed 7)  Bentyl 20 Mg Tabs (Dicyclomine Hcl) .... Take 1 Tablet By Mouth Three Times A Day 8)  Prednisone 10 Mg Tabs (Prednisone) .... 3po Qd For 3days, Then 2po Qd For 3days, Then 1po Qd For 3days, Then Stop 9)   Albuterol Sulfate 0.63 Mg/63ml Nebu (Albuterol Sulfate) .... Use Asd Hhn Qid As Needed 10)  Promethazine Hcl 25 Mg Tabs (Promethazine Hcl) .Marland Kitchen.. 1po Q 6 Hrs As Needed Nausea  Allergies (verified): 1)  ! Claritin (Loratadine) 2)  ! Motrin Ib (Ibuprofen) 3)  ! Imitrex (Sumatriptan Succinate) 4)  ! Prozac  Past History:  Past Medical History: Last updated: 02/19/2009 Anxiety migraine Anemia-NOS Asthma Depression hx of stress fx L5  - traumatic lumbar disc disease/lumbar spondylolisthesis PCOS Anal Fissure Chronic Headaches Kidney Stones Obesity Urinary Tract Infection  Past Surgical History: Last updated: 02/19/2009 Cholecystectomy Tonsillectomy Hysterectomy  Social History: Last updated: 02/19/2009 Former Smoker Alcohol use-yes Occupation: Receptionist Daily Caffeine Use Illicit Drug Use - no  Risk Factors: Smoking Status: quit (01/01/2007)  Review of Systems       all otherwise negative per pt -  Physical Exam  General:  alert and overweight-appearing.   Head:  normocephalic and atraumatic.   Eyes:  vision grossly intact, pupils equal, and pupils round.   Ears:  R ear normal and L ear normal.   Nose:  no external deformity and no nasal discharge.   Mouth:  no gingival abnormalities and pharynx pink and moist.   Neck:  supple and no masses.   Lungs:  normal respiratory effort and normal breath sounds.   Heart:  normal rate and regular rhythm.   Abdomen:  soft, non-tender, and normal bowel sounds.   Msk:  no joint tenderness and no joint swelling.   Extremities:  no edema, no erythema  Neurologic:  alert & oriented X3, cranial nerves II-XII intact, strength normal in all extremities, and sensation intact to light touch.  except for 4+ prox and distal RUE and RLE weakness and decreased sens to LT to RLE diffusely   Impression & Recommendations:  Problem # 1:  HEADACHE (ICD-784.0)  Her updated medication list for this problem includes:    Midrin  325-65-100 Mg Caps (Apap-isometheptene-dichloral) .Marland Kitchen... 1 by mouth two times a day prn    Hydrocodone-acetaminophen 5-500 Mg Tabs (Hydrocodone-acetaminophen) .Marland Kitchen... As needed some typical and atypical features - has new RUE weakness in addition to her known previous RLE weakness , as well as recent hx of transient left facial weakness - will need MRI head - r/o cva assoc with migraine or other such as tumor; today for demeraol/phenergan 50/25 mg IM, and refill midrin/phenergan for home as needed   Orders: Radiology Referral (Radiology)  Problem # 2:  BACK PAIN (ICD-724.5)  Her updated medication list for this problem includes:    Hydrocodone-acetaminophen 5-500 Mg Tabs (Hydrocodone-acetaminophen) .Marland Kitchen... As needed    Flexeril 5 Mg Tabs (Cyclobenzaprine hcl) .Marland Kitchen... 1 by mouth three times a day as needed chronic , no change, follow with same meds  Complete Medication List:  1)  Alprazolam 0.5 Mg Tabs (Alprazolam) .Marland Kitchen.. 1 by mouth once daily as needed 2)  Midrin 325-65-100 Mg Caps (Apap-isometheptene-dichloral) .Marland Kitchen.. 1 by mouth two times a day prn 3)  Proair Hfa 108 (90 Base) Mcg/act Aers (Albuterol sulfate) .... 2 puffs qid as needed 4)  Hydrocodone-acetaminophen 5-500 Mg Tabs (Hydrocodone-acetaminophen) .... As needed 5)  Wellbutrin Xl 300 Mg Xr24h-tab (Bupropion hcl) .... Once daily 6)  Flexeril 5 Mg Tabs (Cyclobenzaprine hcl) .Marland Kitchen.. 1 by mouth three times a day as needed 7)  Bentyl 20 Mg Tabs (Dicyclomine hcl) .... Take 1 tablet by mouth three times a day 8)  Prednisone 10 Mg Tabs (Prednisone) .... 3po qd for 3days, then 2po qd for 3days, then 1po qd for 3days, then stop 9)  Albuterol Sulfate 0.63 Mg/27ml Nebu (Albuterol sulfate) .... Use asd hhn qid as needed 10)  Promethazine Hcl 25 Mg Tabs (Promethazine hcl) .Marland Kitchen.. 1po q 6 hrs as needed nausea  Other Orders: Admin of Therapeutic Inj  intramuscular or subcutaneous (16109)  Patient Instructions: 1)  you had the demerol/phenergan shot today 2)   Please take all new medications as prescribed 3)  Continue all previous medications as before this visit 4)  You will be contacted about the referral(s) to: MRI 5)  Please schedule a follow-up appointment as needed, or certainly if any worsening symptoms Prescriptions: PROMETHAZINE HCL 25 MG TABS (PROMETHAZINE HCL) 1po q 6 hrs as needed nausea  #40 x 1   Entered and Authorized by:   Corwin Levins MD   Signed by:   Corwin Levins MD on 04/26/2009   Method used:   Print then Give to Patient   RxID:   [id number] MIDRIN 325-65-100 MG  CAPS (APAP-ISOMETHEPTENE-DICHLORAL) 1 by mouth two times a day prn  #60 x 0   Entered and Authorized by:   Corwin Levins MD   Signed by:   Corwin Levins MD on 04/26/2009   Method used:   Print then Give to Patient   RxID:   [id number]    Medication Administration  Injection # 1:    Medication: Demerol / Phenergan Injection    Diagnosis: MIGRAINES, HX OF (ICD-V13.8)    Route: IM    Site: L thigh    Exp Date: 11/2010    Lot #: [id number]    Mfr: Sander Radon plus    Given by: Zella Ball Ewing (April 26, 2009 3:59 PM)  Orders Added: 1)  Radiology Referral [Radiology] 2)  Admin of Therapeutic Inj  intramuscular or subcutaneous [96372] 3)  Est. Patient Level IV [13244]

## 2010-04-19 NOTE — Consult Note (Signed)
Summary: [facility]  [facility]   Imported By: Sherian Rein 06/11/2009 09:08:13  _____________________________________________________________________  External Attachment:    Type:   Image     Comment:   External Document

## 2010-04-19 NOTE — Assessment & Plan Note (Signed)
Summary: f/u from colonoscopy--ch.    History of Present Illness Visit Type: Follow-up Visit Primary GI MD: Lina Sar MD Primary Provider: Bayard Males Chief Complaint: follow-up colonoscopy  still c/o alternating constipation and diarrhea History of Present Illness:   This is a 30 year old white female with polycystic ovary syndrome who is status post evaluation of diarrhea which was attributed to metformin. The diarrhea stopped after stopping the Metformin. She is now constipated. A colonoscopy in December 2010 was normal with negative random biopsies. She had internal hemorrhoids but has not used any of the suppositories given to her. She is complaining of gas and a weight gain of about 20 pounds.   GI Review of Systems    Reports bloating, nausea, and  weight gain.      Denies abdominal pain, acid reflux, belching, chest pain, dysphagia with liquids, dysphagia with solids, heartburn, loss of appetite, vomiting, vomiting blood, and  weight loss.      Reports constipation, diarrhea, hemorrhoids, and  rectal bleeding.     Denies anal fissure, black tarry stools, change in bowel habit, diverticulosis, fecal incontinence, heme positive stool, irritable bowel syndrome, jaundice, light color stool, liver problems, and  rectal pain.    Current Medications (verified): 1)  Alprazolam 0.5 Mg  Tabs (Alprazolam) .Marland Kitchen.. 1 By Mouth Once Daily As Needed 2)  Midrin 325-65-100 Mg  Caps (Apap-Isometheptene-Dichloral) .Marland Kitchen.. 1 By Mouth Two Times A Day Prn 3)  Proair Hfa 108 (90 Base) Mcg/act Aers (Albuterol Sulfate) .... 2 Puffs Qid As Needed 4)  Hydrocodone-Acetaminophen 5-500 Mg Tabs (Hydrocodone-Acetaminophen) .Marland Kitchen.. 1 By Mouth Two Times A Day As Needed For Pain 5)  Flexeril 5 Mg Tabs (Cyclobenzaprine Hcl) .Marland Kitchen.. 1 By Mouth Three Times A Day As Needed 6)  Bentyl 20 Mg Tabs (Dicyclomine Hcl) .... Take 1 Tablet By Mouth Three Times A Day 7)  Albuterol Sulfate 0.63 Mg/21ml Nebu (Albuterol Sulfate) .... Use Asd  Hhn Qid As Needed 8)  Promethazine Hcl 25 Mg Tabs (Promethazine Hcl) .Marland Kitchen.. 1po Q 6 Hrs As Needed Nausea 9)  Robaxin-750 750 Mg Tabs (Methocarbamol) .Marland Kitchen.. 1po Two Times A Day As Needed 10)  Zoloft 50 Mg Tabs (Sertraline Hcl) .Marland Kitchen.. 1 By Mouth Once Daily  Allergies (verified): 1)  ! Claritin (Loratadine) 2)  ! Motrin Ib (Ibuprofen) 3)  ! Imitrex (Sumatriptan Succinate) 4)  ! Prozac  Past History:  Past Medical History: Reviewed history from 02/19/2009 and no changes required. Anxiety migraine Anemia-NOS Asthma Depression hx of stress fx L5  - traumatic lumbar disc disease/lumbar spondylolisthesis PCOS Anal Fissure Chronic Headaches Kidney Stones Obesity Urinary Tract Infection  Past Surgical History: Reviewed history from 02/19/2009 and no changes required. Cholecystectomy Tonsillectomy Hysterectomy  Family History: Reviewed history from 06/11/2007 and no changes required. Family History Diabetes 1st degree relative Family History Hypertension Paternal Grandfather - emphysema Allergies - mother Paternal Grandfather - heart disease Maternal great grandmother - breast cancer Maternal grandfather - liver cancer No FH of Colon Cancer:  Social History: Reviewed history from 02/19/2009 and no changes required. Former Smoker Alcohol use-yes Occupation: Receptionist Daily Caffeine Use Illicit Drug Use - no  Review of Systems  The patient denies allergy/sinus, anemia, anxiety-new, arthritis/joint pain, back pain, blood in urine, breast changes/lumps, change in vision, confusion, cough, coughing up blood, depression-new, fainting, fatigue, fever, headaches-new, hearing problems, heart murmur, heart rhythm changes, itching, menstrual pain, muscle pains/cramps, night sweats, nosebleeds, pregnancy symptoms, shortness of breath, skin rash, sleeping problems, sore throat, swelling of feet/legs, swollen  lymph glands, thirst - excessive , urination - excessive , urination  changes/pain, urine leakage, vision changes, and voice change.         Pertinent positive and negative review of systems were noted in the above HPI. All other ROS was otherwise negative.   Vital Signs:  Patient profile:   30 year old female Height:      68 inches Weight:      233 pounds BMI:     35.56 Pulse rate:   88 / minute Pulse rhythm:   regular BP sitting:   118 / 72  (left arm)  Vitals Entered By: Milford Cage NCMA (June 15, 2009 3:34 PM)   Impression & Recommendations:  Problem # 1:  DIARRHEA, ACUTE, CHRONIC (ICD-787.91) Patient has had  complete resolution of diarrhea upon stopping metformin. She is now constipated. She needs to get back on metformin to control the weight gain. She was started on metformin 875 mg daily. I have given her a booklet on gas producing foods.  Patient Instructions: 1)  probiotics daily p.r.n. 2)  Begin metformin 8 75 mg daily. 3)  Patient's information on gas-forming foods. 4)  Copy sent to : Dr J.John 5)  The medication list was reviewed and reconciled.  All changed / newly prescribed medications were explained.  A complete medication list was provided to the patient / caregiver.

## 2010-04-19 NOTE — Progress Notes (Signed)
Summary: Questions   Phone Note Call from Patient Call back at Home Phone [phone]   Caller: Patient Call For: Dr Juanda Chance Reason for Call: Talk to Nurse Summary of Call: Pt was in [facility] ER on Wed w/RLQ pain & diarrhea.  CT neg, labs were done and they sent her home.  She still has diarrhea & pain.  Please call.  She specifically ask to speak w/Dottie.  Thanks Initial call taken by: Misty Stanley,  November 12, 2009 2:33 PM  Follow-up for Phone Call        Left message for patient to call back Darcey Nora RN, [facility]  November 15, 2009 8:41 AM  All questions answered.  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  She is currently out of work and wants to avoid office visits.  She is using Bentyl  and this is helping.  She asked me to review the ER note with her as she was given no results when she went.  Having continued RLQ pain, but she declines an appointment at this time.  She wants to see her neurosurgeon because she is also having issues from her back surgery.  She has an appointment with him next week.  If he feels her pain is unrelated to her recent back surgery she will call back for an appointment.  In the mean time she continue on Bentyl and imodium Follow-up by: Darcey Nora RN, CGRN,  November 15, 2009 4:06 PM

## 2010-04-19 NOTE — Assessment & Plan Note (Signed)
Summary: sore throat,cough/cd   Vital Signs:  Patient profile:   30 year old female Height:      67 inches Weight:      218 pounds BMI:     34.27 O2 Sat:      98 % on Room air Temp:     99 degrees F oral Pulse rate:   93 / minute BP sitting:   108 / 80  (left arm) Cuff size:   large  Vitals Entered ByMarland Kitchen Zella Ball Ewing (March 23, 2009 2:16 PM)  O2 Flow:  Room air CC: sore throat,cough,chest and nasal congestion/RE   Primary Care Provider:  Bayard Males  CC:  sore throat, cough, and chest and nasal congestion/RE.  History of Present Illness: curretnly off metformin, denies polys, diarrhea has resolved but now constipated she thinks;  here today with overall stable anxiety, denies worsening depressive symptoms or suicidal ideation; but does have acute onset mild to mod ST, fever and cough mild prod, but today with worsening mild wheezing and sob, but no significant doe and Pt denies CP,  orthopnea, pnd, worsening LE edema, palps, dizziness or syncope Pt denies new neuro symptoms such as headache, facial or extremity weakness   Problems Prior to Update: 1)  Wheezing  (ICD-786.07) 2)  Bronchitis-acute  (ICD-466.0) 3)  Diarrhea, Acute, Chronic  (ICD-787.91) 4)  Hx of Hydronephrosis, Right  (ICD-591) 5)  Migraines, Hx of  (ICD-V13.8) 6)  Pyelonephritis, Hx of  (ICD-V13.00) 7)  Lumbar Radiculopathy, Right  (ICD-724.4) 8)  Elevated Blood Pressure Without Diagnosis of Hypertension  (ICD-796.2) 9)  Back Pain  (ICD-724.5) 10)  Asthmatic Bronchitis, Acute  (ICD-466.0) 11)  Obesity, Morbid  (ICD-278.01) 12)  Preventive Health Care  (ICD-V70.0) 13)  Hypersomnia  (ICD-780.54) 14)  Insomnia-sleep Disorder-unspec  (ICD-780.52) 15)  Syncope  (ICD-780.2) 16)  Family History Diabetes 1st Degree Relative  (ICD-V18.0) 17)  Depression  (ICD-311) 18)  Asthma  (ICD-493.90) 19)  Anemia-nos  (ICD-285.9) 20)  Common Migraine  (ICD-346.10) 21)  Anxiety  (ICD-300.00)  Medications Prior to  Update: 1)  Alprazolam 0.5 Mg  Tabs (Alprazolam) .Marland Kitchen.. 1 By Mouth Once Daily As Needed 2)  Midrin 325-65-100 Mg  Caps (Apap-Isometheptene-Dichloral) .Marland Kitchen.. 1 By Mouth Two Times A Day Prn 3)  Proair Hfa 108 (90 Base) Mcg/act Aers (Albuterol Sulfate) .... 2 Puffs Qid As Needed 4)  Hydrocodone-Acetaminophen 5-500 Mg Tabs (Hydrocodone-Acetaminophen) .... As Needed 5)  Metformin Hcl 850 Mg Tabs (Metformin Hcl) .Marland Kitchen.. 1 By Mouth Once Daily 6)  Wellbutrin Xl 300 Mg Xr24h-Tab (Bupropion Hcl) .... Once Daily 7)  Metformin Hcl 500 Mg Tabs (Metformin Hcl) .Marland Kitchen.. 1 Once Daily 8)  Flexeril 5 Mg Tabs (Cyclobenzaprine Hcl) .Marland Kitchen.. 1 By Mouth Three Times A Day As Needed 9)  Bentyl 20 Mg Tabs (Dicyclomine Hcl) .... Take 1 Tablet By Mouth Three Times A Day 10)  Anusol-Hc 25 Mg Supp (Hydrocortisone Acetate) .... One Rectally One At Bedtime  Current Medications (verified): 1)  Alprazolam 0.5 Mg  Tabs (Alprazolam) .Marland Kitchen.. 1 By Mouth Once Daily As Needed 2)  Midrin 325-65-100 Mg  Caps (Apap-Isometheptene-Dichloral) .Marland Kitchen.. 1 By Mouth Two Times A Day Prn 3)  Proair Hfa 108 (90 Base) Mcg/act Aers (Albuterol Sulfate) .... 2 Puffs Qid As Needed 4)  Hydrocodone-Acetaminophen 5-500 Mg Tabs (Hydrocodone-Acetaminophen) .... As Needed 5)  Wellbutrin Xl 300 Mg Xr24h-Tab (Bupropion Hcl) .... Once Daily 6)  Flexeril 5 Mg Tabs (Cyclobenzaprine Hcl) .Marland Kitchen.. 1 By Mouth Three Times A Day As Needed 7)  Bentyl 20 Mg Tabs (Dicyclomine Hcl) .... Take 1 Tablet By Mouth Three Times A Day 8)  Anusol-Hc 25 Mg Supp (Hydrocortisone Acetate) .... One Rectally One At Bedtime 9)  Azithromycin 250 Mg Tabs (Azithromycin) .... 2po Qd For 1 Day, Then 1po Qd For 4days, Then Stop 10)  Hydrocodone-Homatropine 5-1.5 Mg/17ml Syrp (Hydrocodone-Homatropine) .Marland Kitchen.. 1 Tsp By Mouth Q 6 Hrs As Needed Cough 11)  Prednisone 10 Mg Tabs (Prednisone) .... 3po Qd For 3days, Then 2po Qd For 3days, Then 1po Qd For 3days, Then Stop  Allergies (verified): 1)  ! Claritin (Loratadine) 2)   ! Motrin Ib (Ibuprofen) 3)  ! Imitrex (Sumatriptan Succinate) 4)  ! Prozac  Past History:  Past Medical History: Last updated: 02/19/2009 Anxiety migraine Anemia-NOS Asthma Depression hx of stress fx L5  - traumatic lumbar disc disease/lumbar spondylolisthesis PCOS Anal Fissure Chronic Headaches Kidney Stones Obesity Urinary Tract Infection  Past Surgical History: Last updated: 02/19/2009 Cholecystectomy Tonsillectomy Hysterectomy  Social History: Last updated: 02/19/2009 Former Smoker Alcohol use-yes Occupation: Receptionist Daily Caffeine Use Illicit Drug Use - no  Risk Factors: Smoking Status: quit (01/01/2007)  Review of Systems       all otherwise negative per pt -   Physical Exam  General:  alert and overweight-appearing.  , mild ill  Head:  normocephalic and atraumatic.   Eyes:  vision grossly intact, pupils equal, and pupils round.   Ears:  bilat tm's red, sinus nontender Nose:  nasal dischargemucosal pallor and mucosal erythema.   Mouth:  pharyngeal erythema and fair dentition.   Neck:  supple and no masses.   Lungs:  normal respiratory effort, R decreased breath sounds, R wheezes, L decreased breath sounds, and L wheezes.   Heart:  normal rate and regular rhythm.   Extremities:  no edema, no erythema    Impression & Recommendations:  Problem # 1:  BRONCHITIS-ACUTE (ICD-466.0)  Her updated medication list for this problem includes:    Proair Hfa 108 (90 Base) Mcg/act Aers (Albuterol sulfate) .Marland Kitchen... 2 puffs qid as needed    Azithromycin 250 Mg Tabs (Azithromycin) .Marland Kitchen... 2po qd for 1 day, then 1po qd for 4days, then stop    Hydrocodone-homatropine 5-1.5 Mg/19ml Syrp (Hydrocodone-homatropine) .Marland Kitchen... 1 tsp by mouth q 6 hrs as needed cough treat as above, f/u any worsening signs or symptoms   Problem # 2:  WHEEZING (ICD-786.07)  liekly due to above - for depo shot and prednisone burst and taper off   Orders: Depo- Medrol 40mg  (J1030) Depo-  Medrol 80mg  (J1040) Admin of Therapeutic Inj  intramuscular or subcutaneous ([address])  Problem # 3:  ANXIETY (ICD-300.00)  Her updated medication list for this problem includes:    Alprazolam 0.5 Mg Tabs (Alprazolam) .Marland Kitchen... 1 by mouth once daily as needed    Wellbutrin Xl 300 Mg Xr24h-tab (Bupropion hcl) ..... Once daily stable overall by hx and exam, ok to continue meds/tx as is   Problem # 4:  DIARRHEA, ACUTE, CHRONIC (ICD-787.91) improved, to f/u GI as planned  Complete Medication List: 1)  Alprazolam 0.5 Mg Tabs (Alprazolam) .Marland Kitchen.. 1 by mouth once daily as needed 2)  Midrin 325-65-100 Mg Caps (Apap-isometheptene-dichloral) .Marland Kitchen.. 1 by mouth two times a day prn 3)  Proair Hfa 108 (90 Base) Mcg/act Aers (Albuterol sulfate) .... 2 puffs qid as needed 4)  Hydrocodone-acetaminophen 5-500 Mg Tabs (Hydrocodone-acetaminophen) .... As needed 5)  Wellbutrin Xl 300 Mg Xr24h-tab (Bupropion hcl) .... Once daily 6)  Flexeril 5 Mg Tabs (Cyclobenzaprine hcl) .Marland KitchenMarland KitchenMarland Kitchen  1 by mouth three times a day as needed 7)  Bentyl 20 Mg Tabs (Dicyclomine hcl) .... Take 1 tablet by mouth three times a day 8)  Anusol-hc 25 Mg Supp (Hydrocortisone acetate) .... One rectally one at bedtime 9)  Azithromycin 250 Mg Tabs (Azithromycin) .... 2po qd for 1 day, then 1po qd for 4days, then stop 10)  Hydrocodone-homatropine 5-1.5 Mg/77ml Syrp (Hydrocodone-homatropine) .Marland Kitchen.. 1 tsp by mouth q 6 hrs as needed cough 11)  Prednisone 10 Mg Tabs (Prednisone) .... 3po qd for 3days, then 2po qd for 3days, then 1po qd for 3days, then stop  Patient Instructions: 1)  you had the steroid shot today 2)  Please take all new medications as prescribed 3)  Continue all previous medications as before this visit  4)  Please keep your appt with GI as planned 5)  Please schedule a follow-up appointment as needed. Prescriptions: PREDNISONE 10 MG TABS (PREDNISONE) 3po qd for 3days, then 2po qd for 3days, then 1po qd for 3days, then stop  #18 x 0   Entered  and Authorized by:   Corwin Levins MD   Signed by:   Corwin Levins MD on 03/23/2009   Method used:   Print then Give to Patient   RxID:   [id number] HYDROCODONE-HOMATROPINE 5-1.5 MG/5ML SYRP (HYDROCODONE-HOMATROPINE) 1 tsp by mouth q 6 hrs as needed cough  #6 oz x 1   Entered and Authorized by:   Corwin Levins MD   Signed by:   Corwin Levins MD on 03/23/2009   Method used:   Print then Give to Patient   RxID:   [id number] AZITHROMYCIN 250 MG TABS (AZITHROMYCIN) 2po qd for 1 day, then 1po qd for 4days, then stop  #6 x 1   Entered and Authorized by:   Corwin Levins MD   Signed by:   Corwin Levins MD on 03/23/2009   Method used:   Print then Give to Patient   RxID:   [id number]    Medication Administration  Injection # 1:    Medication: Depo- Medrol 40mg     Diagnosis: WHEEZING (ICD-786.07)    Route: IM    Site: LUOQ gluteus    Exp Date: 12/2009    Lot #: [id number]    Mfr: Teva    Given by: Scharlene Gloss (March 23, 2009 3:22 PM)  Injection # 2:    Medication: Depo- Medrol 80mg     Diagnosis: WHEEZING (ICD-786.07)    Route: IM    Site: LUOQ gluteus    Exp Date: 12/2009    Lot #: [id number]    Mfr: Teva    Given by: Zella Ball Ewing (March 23, 2009 3:22 PM)  Orders Added: 1)  Depo- Medrol 40mg  [J1030] 2)  Depo- Medrol 80mg  [J1040] 3)  Admin of Therapeutic Inj  intramuscular or subcutaneous [96372] 4)  Est. Patient Level IV [42595]

## 2010-04-19 NOTE — Progress Notes (Signed)
Summary: Nausea  Medications Added ZOFRAN 4 MG TABS (ONDANSETRON HCL) 1 by mouth q 6-8 hours       Phone Note Call from Patient Call back at Home Phone [phone]   Caller: pt  Call For: Dr Juanda Chance Summary of Call: Patient  called requesting a rx for zofran.  She has some bladder issues that are causeing her nausea.  She has an appointment with her GYN on Monday.  She has phenergan but this makes her sleepy.  She is requesting zofran for her nasuea.  Still has occasional diarrhea.  Would like this called into [facility].    Follow-up for Phone Call        she has polycystic ovary disease followed by a GYN. OK to send Zofran 4mg ,#10, 1 by mouth q6-8 hrs as needed nausea, no refill, one time only Follow-up by: Hart Carwin MD,  December 09, 2009 4:36 PM    New/Updated Medications: ZOFRAN 4 MG TABS (ONDANSETRON HCL) 1 by mouth q 6-8 hours Prescriptions: ZOFRAN 4 MG TABS (ONDANSETRON HCL) 1 by mouth q 6-8 hours  #10 x 0   Entered by:   Darcey Nora RN, CGRN   Authorized by:   Hart Carwin MD   Signed by:   Darcey Nora RN, CGRN on 12/09/2009   Method used:   Faxed to ...       [facility]* (retail)       [address] [place], [place]  [address]       Ph: [phone] or [phone]       Fax: [phone]   RxID:   [id number]

## 2010-04-19 NOTE — Progress Notes (Signed)
Summary: med refill  Phone Note Call from Patient   Caller: Patient Summary of Call: Angie Brown requested a refill on her Flexeril and Vicodin.  She uses [facility]  Initial call taken by: Scharlene Gloss,  April 26, 2009 4:48 PM  Follow-up for Phone Call        done hardcopy to LIM side B - dahlia  Follow-up by: Corwin Levins MD,  April 26, 2009 5:23 PM  Additional Follow-up for Phone Call Additional follow up Details #1::        RX faxed. Left message on machine to call back to office. Additional Follow-up by: Lucious Groves,  April 27, 2009 8:19 AM    Additional Follow-up for Phone Call Additional follow up Details #2::    pt informed to check with Pharmacy Follow-up by: Margaret Pyle, CMA,  April 27, 2009 9:26 AM  New/Updated Medications: HYDROCODONE-ACETAMINOPHEN 5-500 MG TABS (HYDROCODONE-ACETAMINOPHEN) 1 by mouth two times a day as needed for pain FLEXERIL 5 MG TABS (CYCLOBENZAPRINE HCL) 1 by mouth three times a day as needed Prescriptions: HYDROCODONE-ACETAMINOPHEN 5-500 MG TABS (HYDROCODONE-ACETAMINOPHEN) 1 by mouth two times a day as needed for pain  #60 x 1   Entered and Authorized by:   Corwin Levins MD   Signed by:   Corwin Levins MD on 04/26/2009   Method used:   Print then Give to Patient   RxID:   [id number] FLEXERIL 5 MG TABS (CYCLOBENZAPRINE HCL) 1 by mouth three times a day as needed  #60 x 1   Entered and Authorized by:   Corwin Levins MD   Signed by:   Corwin Levins MD on 04/26/2009   Method used:   Print then Give to Patient   RxID:   [id number]

## 2010-04-19 NOTE — Letter (Signed)
Summary: [facility] Consult Scheduled Letter  [facility]  [address] [place], [place] [address]   Phone: [phone]  Fax: [phone]      05/05/2009 MRN: [id number]  [facility] [address] [place], [place]  [address]    Dear Ms. Angie Brown,      We have scheduled an appointment for you.  At the recommendation of Dr.John, we have scheduled you a consult with Dr Terrace Arabia on 06/02/09 at 1:30pm.  Their phone number is [phone].  If this appointment day and time is not convenient for you, please feel free to call the office of the doctor you are being referred to at the number listed above and reschedule the appointment.    [facility] [address] [place], [place] [address]   *Please arrive 30 minutes prior to appointment.*   Thank you,  Patient Care Coordinator [facility]

## 2010-04-19 NOTE — Progress Notes (Signed)
   Julieta Gutting over to Debra/[facility] [phone] [facility]  July 15, 2009 1:08 PM

## 2010-04-19 NOTE — Progress Notes (Signed)
Summary: Fleseril request  Phone Note Call from Patient Call back at [phone] or [phone]   Caller: Patient Call For: Corwin Levins MD Summary of Call: Pt pulled muscle in neck, requesting Flexeril. I said I could work her in for appt today. She is not feeling well and wants to go home. If she is not feeling better she will call and sched appt for tomorrow but requesting medicine I told her I would relay the message but did not think meds would be called in. Please advise. Initial call taken by: Verdell Face,  May 13, 2009 10:09 AM  Follow-up for Phone Call        ok this time for as needed flexeril - done hardcopy to LIM side B - dahlia  Follow-up by: Corwin Levins MD,  May 13, 2009 1:16 PM  Additional Follow-up for Phone Call Additional follow up Details #1::        pt states she has some of the flexeril 5mg  and they don't seem to be helping? Pt would like to know if there can be something stronger sent in? If not pt still would like Flexeril sent in because she only has a couple? Pt would like them sent to [facility]? Please advise? Additional Follow-up by: Josph Macho RMA,  May 13, 2009 2:45 PM    Additional Follow-up for Phone Call Additional follow up Details #2::    change to robaxin as needed - done hardcopy to LIM side B - dahlia  Follow-up by: Corwin Levins MD,  May 13, 2009 3:08 PM  Additional Follow-up for Phone Call Additional follow up Details #3:: Details for Additional Follow-up Action Taken: Faxed and I informed pt. Additional Follow-up by: Josph Macho RMA,  May 13, 2009 4:44 PM  New/Updated Medications: FLEXERIL 5 MG TABS (CYCLOBENZAPRINE HCL) 1 by mouth three times a day as needed ROBAXIN-750 750 MG TABS (METHOCARBAMOL) 1po two times a day as needed Prescriptions: ROBAXIN-750 750 MG TABS (METHOCARBAMOL) 1po two times a day as needed  #60 x 0   Entered and Authorized by:   Corwin Levins MD   Signed by:   Corwin Levins MD on  05/13/2009   Method used:   Print then Give to Patient   RxID:   [id number] FLEXERIL 5 MG TABS (CYCLOBENZAPRINE HCL) 1 by mouth three times a day as needed  #40 x 1   Entered and Authorized by:   Corwin Levins MD   Signed by:   Corwin Levins MD on 05/13/2009   Method used:   Print then Give to Patient   RxID:   [id number]

## 2010-04-19 NOTE — Progress Notes (Signed)
Summary: PT NEED RX  Phone Note Call from Patient   Reason for Call: Talk to Nurse Summary of Call: PATIENT WANTS TO KNOW IF SHE CAN GET HER ALBUTEROL CALLED IN RIGHT AWAY. PT STATED SHE SAW DR. Jonny Ruiz ON YESTERDAY. PATIENT IS GETTING NO RELIEF FROM INHALER. HAVING DIFFICULTY BREATHING. PT WANTS RX CALLED INTO [facility] ON [address] [phone].  PT CALL BACK NUMBER IS [phone]. PT NEEDS NEW MASK AND TUBING AS WELL. Initial call taken by: Daphane Shepherd,  March 24, 2009 2:30 PM  Follow-up for Phone Call        pt is requesting albuterol neb treatment that are not on med list. okay to add and fill? Follow-up by: Margaret Pyle, CMA,  March 24, 2009 2:41 PM  Additional Follow-up for Phone Call Additional follow up Details #1::        ok for rx, but she should consider going to ER as it very unlikely this can be done today and it sounds as if she is not responding to the tx as per 1/4; I have never prescribed the mask and tubing so I am not sure how to do that Additional Follow-up by: Corwin Levins MD,  March 24, 2009 3:56 PM    Additional Follow-up for Phone Call Additional follow up Details #2::    pt informed, rx faxed to pharmacy per pt request Follow-up by: Margaret Pyle, CMA,  March 24, 2009 4:12 PM  New/Updated Medications: ALBUTEROL SULFATE 0.63 MG/3ML NEBU (ALBUTEROL SULFATE) use asd HHN qid as needed Prescriptions: ALBUTEROL SULFATE 0.63 MG/3ML NEBU (ALBUTEROL SULFATE) use asd HHN qid as needed  #120 x 1   Entered and Authorized by:   Corwin Levins MD   Signed by:   Corwin Levins MD on 03/24/2009   Method used:   Print then Give to Patient   RxID:   [id number]

## 2010-04-19 NOTE — Progress Notes (Signed)
Summary: Migraine  Phone Note Call from Patient   Summary of Call: Patient is requesting rx for migraine that she has had x 4 days. She has been taking her "pain pill" and is out of phenergan but has gotten no relief. Scheduled for office visit today for eval. Initial call taken by: Lamar Sprinkles, CMA,  April 26, 2009 8:28 AM

## 2010-04-21 NOTE — Assessment & Plan Note (Signed)
Summary: post er/can barely talk/cd   Vital Signs:  Patient profile:   30 year old female Height:      67.5 inches Weight:      218 pounds BMI:     33.76 O2 Sat:      97 % on Room air Temp:     98.1 degrees F oral Pulse rate:   72 / minute BP sitting:   82 / 60  (left arm) Cuff size:   large  Vitals Entered By: Zella Ball Ewing CMA Duncan Dull) (March 18, 2010 3:47 PM)  O2 Flow:  Room air CC: post ER/RE   Primary Care Provider:  Bayard Males  CC:  post ER/RE.  History of Present Illness: here in f/u from ER visit yesterday , seen for fever, hoarseness and wheezing, bronchitis - tx with prednisone high dose but has not actually had the prescription filled yet;  has significan anxiety attack this am, but better now;  symptoms overall about the same except seems cough is now more producitve adn ST seems worse;  wheezing with sob/doe about the same;  Pt denies CP, orthopnea, pnd, worsening LE edema, palps, dizziness or syncope .  Pt denies new neuro symptoms such as facial or extremity weakness  Pt denies polydipsia, polyuria  Overall o/w good compliance with meds. Has marked headache as well due to the coughing with pounding bifrontal, mod to severe, but without blurred vision, n/v, worse with exertion, confusion.  Feels overall signficiant general weakness and malaise, but taking by mouth well, and no falls.  No rash.    Problems Prior to Update: 1)  Wheezing  (ICD-786.07) 2)  Abnormal Involuntary Movements  (ICD-781.0) 3)  Arm Pain  (ICD-729.5) 4)  Facial Paresthesia  (ICD-782.0) 5)  Headache  (ICD-784.0) 6)  Wheezing  (ICD-786.07) 7)  Diarrhea, Acute, Chronic  (ICD-787.91) 8)  Hx of Hydronephrosis, Right  (ICD-591) 9)  Migraines, Hx of  (ICD-V13.8) 10)  Pyelonephritis, Hx of  (ICD-V13.00) 11)  Lumbar Radiculopathy, Right  (ICD-724.4) 12)  Elevated Blood Pressure Without Diagnosis of Hypertension  (ICD-796.2) 13)  Back Pain  (ICD-724.5) 14)  Asthmatic Bronchitis, Acute   (ICD-466.0) 15)  Obesity, Morbid  (ICD-278.01) 16)  Preventive Health Care  (ICD-V70.0) 17)  Hypersomnia  (ICD-780.54) 18)  Insomnia-sleep Disorder-unspec  (ICD-780.52) 19)  Syncope  (ICD-780.2) 20)  Family History Diabetes 1st Degree Relative  (ICD-V18.0) 21)  Depression  (ICD-311) 22)  Asthma  (ICD-493.90) 23)  Anemia-nos  (ICD-285.9) 24)  Common Migraine  (ICD-346.10) 25)  Anxiety  (ICD-300.00)  Medications Prior to Update: 1)  Alprazolam 0.5 Mg  Tabs (Alprazolam) .Marland Kitchen.. 1 By Mouth Once Daily As Needed 2)  Midrin 325-65-100 Mg  Caps (Apap-Isometheptene-Dichloral) .Marland Kitchen.. 1 By Mouth Two Times A Day Prn 3)  Proair Hfa 108 (90 Base) Mcg/act Aers (Albuterol Sulfate) .... 2 Puffs Qid As Needed 4)  Hydrocodone-Acetaminophen 5-500 Mg Tabs (Hydrocodone-Acetaminophen) .Marland Kitchen.. 1 By Mouth Two Times A Day As Needed For Pain 5)  Flexeril 5 Mg Tabs (Cyclobenzaprine Hcl) .Marland Kitchen.. 1 By Mouth Three Times A Day As Needed 6)  Bentyl 20 Mg Tabs (Dicyclomine Hcl) .... Take 1 Tablet By Mouth Three Times A Day 7)  Albuterol Sulfate 0.63 Mg/46ml Nebu (Albuterol Sulfate) .... Use Asd Hhn Qid As Needed 8)  Promethazine Hcl 25 Mg Tabs (Promethazine Hcl) .Marland Kitchen.. 1po Q 6 Hrs As Needed Nausea 9)  Robaxin-750 750 Mg Tabs (Methocarbamol) .Marland Kitchen.. 1po Two Times A Day As Needed 10)  Zoloft 50 Mg Tabs (Sertraline  Hcl) .... 1 By Mouth Once Daily 11)  Zofran 4 Mg Tabs (Ondansetron Hcl) .Marland Kitchen.. 1 By Mouth Q 6-8 Hours  Current Medications (verified): 1)  Alprazolam 0.5 Mg  Tabs (Alprazolam) .Marland Kitchen.. 1 By Mouth Once Daily As Needed 2)  Midrin 325-65-100 Mg  Caps (Apap-Isometheptene-Dichloral) .Marland Kitchen.. 1 By Mouth Two Times A Day Prn 3)  Proair Hfa 108 (90 Base) Mcg/act Aers (Albuterol Sulfate) .... 2 Puffs Qid As Needed 4)  Hydrocodone-Acetaminophen 5-500 Mg Tabs (Hydrocodone-Acetaminophen) .Marland Kitchen.. 1 By Mouth Two Times A Day As Needed For Pain 5)  Flexeril 5 Mg Tabs (Cyclobenzaprine Hcl) .Marland Kitchen.. 1 By Mouth Three Times A Day As Needed 6)  Bentyl 20 Mg Tabs  (Dicyclomine Hcl) .... Take 1 Tablet By Mouth Three Times A Day 7)  Albuterol Sulfate 0.63 Mg/30ml Nebu (Albuterol Sulfate) .... Use Asd Hhn Qid As Needed 8)  Promethazine Hcl 25 Mg Tabs (Promethazine Hcl) .Marland Kitchen.. 1po Q 6 Hrs As Needed Nausea 9)  Robaxin-750 750 Mg Tabs (Methocarbamol) .Marland Kitchen.. 1po Two Times A Day As Needed 10)  Zoloft 50 Mg Tabs (Sertraline Hcl) .Marland Kitchen.. 1 By Mouth Once Daily 11)  Zofran 4 Mg Tabs (Ondansetron Hcl) .Marland Kitchen.. 1 By Mouth Q 6-8 Hours 12)  Tussionex Pennkinetic Er 10-8 Mg/49ml Lqcr (Hydrocod Polst-Chlorphen Polst) .Marland Kitchen.. 1 Tsp By Mouth Two Times A Day As Needed 13)  Azithromycin 250 Mg Tabs (Azithromycin) .... 2po Qd For 1 Day, Then 1po Qd For 4days, Then Stop 14)  Prednisone 20 Mg Tabs (Prednisone) .... Asd  Allergies (verified): 1)  ! Claritin (Loratadine) 2)  ! Motrin Ib (Ibuprofen) 3)  ! Imitrex (Sumatriptan Succinate) 4)  ! Prozac  Past History:  Past Medical History: Last updated: 02/19/2009 Anxiety migraine Anemia-NOS Asthma Depression hx of stress fx L5  - traumatic lumbar disc disease/lumbar spondylolisthesis PCOS Anal Fissure Chronic Headaches Kidney Stones Obesity Urinary Tract Infection  Past Surgical History: Last updated: 02/19/2009 Cholecystectomy Tonsillectomy Hysterectomy  Social History: Last updated: 02/19/2009 Former Smoker Alcohol use-yes Occupation: Receptionist Daily Caffeine Use Illicit Drug Use - no  Risk Factors: Smoking Status: quit (01/01/2007)  Review of Systems       all otherwise negative per pt -    Physical Exam  General:  alert and overweight-appearing.  , mild ill  Head:  normocephalic and atraumatic.   Eyes:  vision grossly intact, pupils equal, and pupils round.   Ears:  bialt tm's red, sinus nontender Nose:  nasal dischargemucosal pallor and mucosal edema.   Mouth:  pharyngeal erythema and fair dentition.   Neck:  supple and cervical lymphadenopathy.   Lungs:  normal respiratory effort.  with some very  mild stridorous wheezing, but no accessory muscle use,  and o/w normal BS Heart:  normal rate and regular rhythm.   Extremities:  no edema, no erythema  Neurologic:  cranial nerves II-XII intact, strength normal in all extremities, and gait normal.   Skin:  color normal and no rashes.   Psych:  not depressed appearing and slightly anxious.     Impression & Recommendations:  Problem # 1:  WHEEZING (ICD-786.07) c/w mild laryngospasm - for depomedrol IM today, and encouraged to take the prednisone as prescribed from the ER , to go back to the ER for any worsening wheezing, sob/doe, also for zpack Orders: Depo- Medrol 40mg  (J1030) Depo- Medrol 80mg  (J1040) Admin of Therapeutic Inj  intramuscular or subcutaneous (16109)  Problem # 2:  HEADACHE (ICD-784.0)  Her updated medication list for this problem includes:  Midrin 325-65-100 Mg Caps (Apap-isometheptene-dichloral) .Marland Kitchen... 1 by mouth two times a day prn    Hydrocodone-acetaminophen 5-500 Mg Tabs (Hydrocodone-acetaminophen) .Marland Kitchen... 1 by mouth two times a day as needed for pain  Orders: Ketorolac-Toradol 15mg  (W1191) Admin of Therapeutic Inj  intramuscular or subcutaneous (47829) for toradol IM, and tylenol as needed , exam o/w benign, most liekly related to above, also for tussionex as needed to avoid further pain  Problem # 3:  HYPOTENSION (ICD-458.9) mild, for increased by mouth fluids, asympt, f/u any worsening symptoms of weakness, dizziness  Problem # 4:  ASTHMA (ICD-493.90)  Her updated medication list for this problem includes:    Proair Hfa 108 (90 Base) Mcg/act Aers (Albuterol sulfate) .Marland Kitchen... 2 puffs qid as needed    Albuterol Sulfate 0.63 Mg/62ml Nebu (Albuterol sulfate) ..... Use asd hhn qid as needed    Prednisone 20 Mg Tabs (Prednisone) .Marland Kitchen... Asd o/w stable overall by hx and exam, ok to continue meds/tx as is   Complete Medication List: 1)  Alprazolam 0.5 Mg Tabs (Alprazolam) .Marland Kitchen.. 1 by mouth once daily as needed 2)   Midrin 325-65-100 Mg Caps (Apap-isometheptene-dichloral) .Marland Kitchen.. 1 by mouth two times a day prn 3)  Proair Hfa 108 (90 Base) Mcg/act Aers (Albuterol sulfate) .... 2 puffs qid as needed 4)  Hydrocodone-acetaminophen 5-500 Mg Tabs (Hydrocodone-acetaminophen) .Marland Kitchen.. 1 by mouth two times a day as needed for pain 5)  Flexeril 5 Mg Tabs (Cyclobenzaprine hcl) .Marland Kitchen.. 1 by mouth three times a day as needed 6)  Bentyl 20 Mg Tabs (Dicyclomine hcl) .... Take 1 tablet by mouth three times a day 7)  Albuterol Sulfate 0.63 Mg/9ml Nebu (Albuterol sulfate) .... Use asd hhn qid as needed 8)  Promethazine Hcl 25 Mg Tabs (Promethazine hcl) .Marland Kitchen.. 1po q 6 hrs as needed nausea 9)  Robaxin-750 750 Mg Tabs (Methocarbamol) .Marland Kitchen.. 1po two times a day as needed 10)  Zoloft 50 Mg Tabs (Sertraline hcl) .Marland Kitchen.. 1 by mouth once daily 11)  Zofran 4 Mg Tabs (Ondansetron hcl) .Marland Kitchen.. 1 by mouth q 6-8 hours 12)  Tussionex Pennkinetic Er 10-8 Mg/65ml Lqcr (Hydrocod polst-chlorphen polst) .Marland Kitchen.. 1 tsp by mouth two times a day as needed 13)  Azithromycin 250 Mg Tabs (Azithromycin) .... 2po qd for 1 day, then 1po qd for 4days, then stop 14)  Prednisone 20 Mg Tabs (Prednisone) .... Asd  Patient Instructions: 1)  you had the toradol shot for pain, and the steroid shot 2)  Please take all new medications as prescribed - the zpack and tussionex 3)  Continue all previous medications as before this visit, including the prednisone you have, and the inhaler 4)  You can also use Mucinex OTC or it's generic for congestion  5)  Please schedule a follow-up appointment as needed, and you should consider returning to the ER if the "wheezing in the neck" and breathing gets worse Prescriptions: AZITHROMYCIN 250 MG TABS (AZITHROMYCIN) 2po qd for 1 day, then 1po qd for 4days, then stop  #6 x 1   Entered and Authorized by:   Corwin Levins MD   Signed by:   Corwin Levins MD on 03/18/2010   Method used:   Print then Give to Patient   RxID:   [id number] TUSSIONEX  PENNKINETIC ER 10-8 MG/5ML LQCR (HYDROCOD POLST-CHLORPHEN POLST) 1 tsp by mouth two times a day as needed  #4oz x 1   Entered and Authorized by:   Corwin Levins MD   Signed by:   Fayrene Fearing  Ellin Mayhew MD on 03/18/2010   Method used:   Print then Give to Patient   RxID:   [id number]    Medication Administration  Injection # 1:    Medication: Depo- Medrol 40mg     Diagnosis: WHEEZING (ICD-786.07)    Route: IM    Site: LUOQ gluteus    Exp Date: 09/2012    Lot #: [id number]    Mfr: [employer]    Comments: Patient received 120mg  Depo-medrol    Patient tolerated injection without complications    Given by: Zella Ball Ewing CMA Duncan Dull) (March 18, 2010 4:15 PM)  Injection # 2:    Medication: Depo- Medrol 80mg     Diagnosis: WHEEZING (ICD-786.07)    Route: IM    Site: LUOQ gluteus    Exp Date: 09/2012    Lot #: [id number]    Mfr: [employer]    Given by: Zella Ball Ewing CMA Duncan Dull) (March 18, 2010 4:15 PM)  Injection # 3:    Medication: Ketorolac-Toradol 15mg     Diagnosis: HEADACHE (ICD-784.0)    Route: IM    Site: RUOQ gluteus    Exp Date: 03/21/2011    Lot #: [id number]    Mfr: [employer]Plus    Comments: Patient received 30mg  Ketorolac-Toradol    Patient tolerated injection without complications    Given by: Zella Ball Ewing CMA Duncan Dull) (March 18, 2010 4:21 PM)  Orders Added: 1)  Depo- Medrol 40mg  [J1030] 2)  Depo- Medrol 80mg  [J1040] 3)  Admin of Therapeutic Inj  intramuscular or subcutaneous [96372] 4)  Ketorolac-Toradol 15mg  [J1885] 5)  Admin of Therapeutic Inj  intramuscular or subcutaneous [96372] 6)  Est. Patient Level IV [46962]     Impression & Recommendations:  Problem # 1:  WHEEZING (ICD-786.07) c/w mild laryngospasm - for depomedrol IM today, and encouraged to take the prednisone as prescribed from the ER , to go back to the ER for any worsening wheezing, sob/doe, also for zpack Orders: Depo- Medrol 40mg  (J1030) Depo- Medrol 80mg  (J1040) Admin of Therapeutic Inj  intramuscular or  subcutaneous (95284)  Problem # 2:  HEADACHE (ICD-784.0)  Her updated medication list for this problem includes:    Midrin 325-65-100 Mg Caps (Apap-isometheptene-dichloral) .Marland Kitchen... 1 by mouth two times a day prn    Hydrocodone-acetaminophen 5-500 Mg Tabs (Hydrocodone-acetaminophen) .Marland Kitchen... 1 by mouth two times a day as needed for pain  Orders: Ketorolac-Toradol 15mg  (X3244) Admin of Therapeutic Inj  intramuscular or subcutaneous (01027) for toradol IM, and tylenol as needed , exam o/w benign, most liekly related to above, also for tussionex as needed to avoid further pain  Problem # 3:  HYPOTENSION (ICD-458.9) mild, for increased by mouth fluids, asympt, f/u any worsening symptoms of weakness, dizziness  Problem # 4:  ASTHMA (ICD-493.90)  Her updated medication list for this problem includes:    Proair Hfa 108 (90 Base) Mcg/act Aers (Albuterol sulfate) .Marland Kitchen... 2 puffs qid as needed    Albuterol Sulfate 0.63 Mg/41ml Nebu (Albuterol sulfate) ..... Use asd hhn qid as needed    Prednisone 20 Mg Tabs (Prednisone) .Marland Kitchen... Asd o/w stable overall by hx and exam, ok to continue meds/tx as is   Complete Medication List: 1)  Alprazolam 0.5 Mg Tabs (Alprazolam) .Marland Kitchen.. 1 by mouth once daily as needed 2)  Midrin 325-65-100 Mg Caps (Apap-isometheptene-dichloral) .Marland Kitchen.. 1 by mouth two times a day prn 3)  Proair Hfa 108 (90 Base) Mcg/act Aers (Albuterol sulfate) .... 2 puffs qid as needed 4)  Hydrocodone-acetaminophen 5-500 Mg Tabs (Hydrocodone-acetaminophen) .Marland KitchenMarland KitchenMarland Kitchen 1  by mouth two times a day as needed for pain 5)  Flexeril 5 Mg Tabs (Cyclobenzaprine hcl) .Marland Kitchen.. 1 by mouth three times a day as needed 6)  Bentyl 20 Mg Tabs (Dicyclomine hcl) .... Take 1 tablet by mouth three times a day 7)  Albuterol Sulfate 0.63 Mg/68ml Nebu (Albuterol sulfate) .... Use asd hhn qid as needed 8)  Promethazine Hcl 25 Mg Tabs (Promethazine hcl) .Marland Kitchen.. 1po q 6 hrs as needed nausea 9)  Robaxin-750 750 Mg Tabs (Methocarbamol) .Marland Kitchen.. 1po two  times a day as needed 10)  Zoloft 50 Mg Tabs (Sertraline hcl) .Marland Kitchen.. 1 by mouth once daily 11)  Zofran 4 Mg Tabs (Ondansetron hcl) .Marland Kitchen.. 1 by mouth q 6-8 hours 12)  Tussionex Pennkinetic Er 10-8 Mg/48ml Lqcr (Hydrocod polst-chlorphen polst) .Marland Kitchen.. 1 tsp by mouth two times a day as needed 13)  Azithromycin 250 Mg Tabs (Azithromycin) .... 2po qd for 1 day, then 1po qd for 4days, then stop 14)  Prednisone 20 Mg Tabs (Prednisone) .... Asd

## 2010-05-26 ENCOUNTER — Telehealth (INDEPENDENT_AMBULATORY_CARE_PROVIDER_SITE_OTHER): Payer: Self-pay | Admitting: *Deleted

## 2010-05-31 NOTE — Progress Notes (Signed)
  Phone Note Other Incoming   Request: Send information Summary of Call: Request for records received from [facility] Request forwarded to [facility].  All medical records-John

## 2010-06-02 LAB — DIFFERENTIAL
Basophils Absolute: 0 10*3/uL (ref 0.0–0.1)
Lymphocytes Relative: 24 % (ref 12–46)
Neutro Abs: 4.6 10*3/uL (ref 1.7–7.7)
Neutrophils Relative %: 66 % (ref 43–77)

## 2010-06-02 LAB — COMPREHENSIVE METABOLIC PANEL
Albumin: 4.2 g/dL (ref 3.5–5.2)
BUN: 5 mg/dL — ABNORMAL LOW (ref 6–23)
Chloride: 106 mEq/L (ref 96–112)
Creatinine, Ser: 0.72 mg/dL (ref 0.4–1.2)
GFR calc non Af Amer: >60 mL/min (ref >60)
Total Bilirubin: 0.5 mg/dL (ref 0.3–1.2)

## 2010-06-02 LAB — LIPASE, BLOOD: Lipase: 38 U/L (ref 11–59)

## 2010-06-02 LAB — CBC
MCH: 29 pg (ref 26.0–34.0)
MCV: 84.8 fL (ref 78.0–100.0)
Platelets: 244 10*3/uL (ref 150–400)
RBC: 4.55 MIL/uL (ref 3.87–5.11)

## 2010-06-02 LAB — URINALYSIS, ROUTINE W REFLEX MICROSCOPIC
Bilirubin Urine: NEGATIVE
Hgb urine dipstick: NEGATIVE
Protein, ur: NEGATIVE mg/dL
Specific Gravity, Urine: 1.013 (ref 1.005–1.030)
Urobilinogen, UA: 1 mg/dL (ref 0.0–1.0)

## 2010-06-06 LAB — POCT I-STAT, CHEM 8
BUN: 9 mg/dL (ref 6–23)
Chloride: 107 mEq/L (ref 96–112)
HCT: 39 % (ref 36.0–46.0)
Sodium: 138 mEq/L (ref 135–145)
TCO2: 23 mmol/L (ref 0–100)

## 2010-06-06 LAB — DIFFERENTIAL
Basophils Absolute: 0 10*3/uL (ref 0.0–0.1)
Lymphocytes Relative: 11 % — ABNORMAL LOW (ref 12–46)
Lymphs Abs: 1.3 10*3/uL (ref 0.7–4.0)
Neutrophils Relative %: 82 % — ABNORMAL HIGH (ref 43–77)

## 2010-06-06 LAB — RAPID URINE DRUG SCREEN, HOSP PERFORMED
Amphetamines: NOT DETECTED
Barbiturates: NOT DETECTED
Benzodiazepines: POSITIVE — AB
Cocaine: NOT DETECTED
Opiates: POSITIVE — AB
Tetrahydrocannabinol: NOT DETECTED

## 2010-06-06 LAB — VANCOMYCIN, TROUGH
Vancomycin Tr: 11 ug/mL (ref 10.0–20.0)
Vancomycin Tr: 7.5 ug/mL — ABNORMAL LOW (ref 10.0–20.0)

## 2010-06-06 LAB — T4, FREE: Free T4: 0.95 ng/dL (ref 0.80–1.80)

## 2010-06-06 LAB — CULTURE, BLOOD (ROUTINE X 2): Culture: NO GROWTH

## 2010-06-06 LAB — URINALYSIS, ROUTINE W REFLEX MICROSCOPIC
Glucose, UA: NEGATIVE mg/dL
Nitrite: NEGATIVE
Protein, ur: NEGATIVE mg/dL
pH: 7.5 (ref 5.0–8.0)

## 2010-06-06 LAB — CBC
Platelets: 209 10*3/uL (ref 150–400)
Platelets: 260 10*3/uL (ref 150–400)
RBC: 3.57 MIL/uL — ABNORMAL LOW (ref 3.87–5.11)
WBC: 12.1 10*3/uL — ABNORMAL HIGH (ref 4.0–10.5)
WBC: 7 10*3/uL (ref 4.0–10.5)

## 2010-06-06 LAB — TROPONIN I: Troponin I: 0.01 ng/mL (ref 0.00–0.06)

## 2010-06-06 LAB — STREP A DNA PROBE: Group A Strep Probe: NEGATIVE

## 2010-06-06 LAB — CK TOTAL AND CKMB (NOT AT ARMC): Relative Index: INVALID (ref 0.0–2.5)

## 2010-06-06 LAB — POCT CARDIAC MARKERS
CKMB, poc: <1 ng/mL — ABNORMAL LOW (ref 1.0–8.0)
Myoglobin, poc: 38.3 ng/mL (ref 12–200)
Myoglobin, poc: 71.4 ng/mL (ref 12–200)

## 2010-06-06 LAB — CARDIAC PANEL(CRET KIN+CKTOT+MB+TROPI)
CK, MB: 0.5 ng/mL (ref 0.3–4.0)
Relative Index: INVALID (ref 0.0–2.5)
Troponin I: <0.01 ng/mL (ref 0.00–0.06)

## 2010-06-06 LAB — TSH: TSH: 2.147 u[IU]/mL (ref 0.350–4.500)

## 2010-06-06 LAB — CORTISOL: Cortisol, Plasma: 13.9 ug/dL

## 2010-06-06 LAB — RAPID STREP SCREEN (MED CTR MEBANE ONLY): Streptococcus, Group A Screen (Direct): NEGATIVE

## 2010-06-06 LAB — HEMOGLOBIN A1C: Mean Plasma Glucose: 111 mg/dL (ref <117)

## 2010-06-07 LAB — DIFFERENTIAL
Basophils Absolute: 0.1 10*3/uL (ref 0.0–0.1)
Eosinophils Absolute: 0.2 10*3/uL (ref 0.0–0.7)
Eosinophils Relative: 2 % (ref 0–5)
Lymphocytes Relative: 21 % (ref 12–46)
Monocytes Absolute: 0.5 10*3/uL (ref 0.1–1.0)

## 2010-06-07 LAB — CBC
HCT: 34.4 % — ABNORMAL LOW (ref 36.0–46.0)
HCT: 37.5 % (ref 36.0–46.0)
Hemoglobin: 12 g/dL (ref 12.0–15.0)
Hemoglobin: 13.3 g/dL (ref 12.0–15.0)
MCHC: 34.8 g/dL (ref 30.0–36.0)
MCHC: 35.4 g/dL (ref 30.0–36.0)
MCV: 92.9 fL (ref 78.0–100.0)
MCV: 91.5 fL (ref 78.0–100.0)
RBC: 3.7 MIL/uL — ABNORMAL LOW (ref 3.87–5.11)
RDW: 12.5 % (ref 11.5–15.5)
WBC: 11.6 10*3/uL — ABNORMAL HIGH (ref 4.0–10.5)

## 2010-06-07 LAB — BASIC METABOLIC PANEL
CO2: 23 mEq/L (ref 19–32)
Chloride: 104 mEq/L (ref 96–112)
GFR calc Af Amer: >60 mL/min (ref >60)
Potassium: 4.4 mEq/L (ref 3.5–5.1)
Sodium: 136 mEq/L (ref 135–145)

## 2010-06-07 LAB — TYPE AND SCREEN: Antibody Screen: NEGATIVE

## 2010-06-07 LAB — SEDIMENTATION RATE: Sed Rate: 48 mm/hr — ABNORMAL HIGH (ref 0–22)

## 2010-06-09 ENCOUNTER — Other Ambulatory Visit: Payer: Self-pay | Admitting: Neurosurgery

## 2010-06-09 ENCOUNTER — Ambulatory Visit
Admission: RE | Admit: 2010-06-09 | Discharge: 2010-06-09 | Disposition: A | Discharge status: Home / Self Care | Payer: Commercial Managed Care - PPO | Source: Ambulatory Visit | Attending: Neurosurgery | Admitting: Neurosurgery

## 2010-06-09 DIAGNOSIS — M545 Low back pain, unspecified: Secondary | ICD-10-CM

## 2010-06-12 LAB — URINALYSIS, ROUTINE W REFLEX MICROSCOPIC
Bilirubin Urine: NEGATIVE
Hgb urine dipstick: NEGATIVE
Protein, ur: NEGATIVE mg/dL
Urobilinogen, UA: 0.2 mg/dL (ref 0.0–1.0)

## 2010-06-12 LAB — CBC
MCHC: 33.4 g/dL (ref 30.0–36.0)
MCV: 93.6 fL (ref 78.0–100.0)
Platelets: 281 10*3/uL (ref 150–400)

## 2010-06-12 LAB — DIFFERENTIAL
Basophils Absolute: 0.1 10*3/uL (ref 0.0–0.1)
Basophils Relative: 1 % (ref 0–1)
Eosinophils Absolute: 0.1 10*3/uL (ref 0.0–0.7)
Neutrophils Relative %: 66 % (ref 43–77)

## 2010-06-12 LAB — POCT I-STAT, CHEM 8
HCT: 36 % (ref 36.0–46.0)
Hemoglobin: 12.2 g/dL (ref 12.0–15.0)
Potassium: 4 mEq/L (ref 3.5–5.1)
Sodium: 138 mEq/L (ref 135–145)

## 2010-06-12 LAB — GLUCOSE, CAPILLARY: Glucose-Capillary: 80 mg/dL (ref 70–99)

## 2010-06-13 ENCOUNTER — Ambulatory Visit
Admission: RE | Admit: 2010-06-13 | Discharge: 2010-06-13 | Disposition: A | Discharge status: Home / Self Care | Payer: Commercial Managed Care - PPO | Source: Ambulatory Visit | Attending: Neurosurgery | Admitting: Neurosurgery

## 2010-06-13 DIAGNOSIS — M545 Low back pain, unspecified: Secondary | ICD-10-CM

## 2010-06-21 LAB — URINALYSIS, ROUTINE W REFLEX MICROSCOPIC
Glucose, UA: NEGATIVE mg/dL
Hgb urine dipstick: NEGATIVE
Ketones, ur: NEGATIVE mg/dL
Protein, ur: NEGATIVE mg/dL

## 2010-06-21 LAB — COMPREHENSIVE METABOLIC PANEL
BUN: 8 mg/dL (ref 6–23)
Calcium: 9.1 mg/dL (ref 8.4–10.5)
Creatinine, Ser: 0.8 mg/dL (ref 0.4–1.2)
Glucose, Bld: 93 mg/dL (ref 70–99)
Total Protein: 6.8 g/dL (ref 6.0–8.3)

## 2010-06-21 LAB — CBC
HCT: 37.2 % (ref 36.0–46.0)
Hemoglobin: 12.7 g/dL (ref 12.0–15.0)
MCHC: 34.1 g/dL (ref 30.0–36.0)
MCV: 90.8 fL (ref 78.0–100.0)
RDW: 12.6 % (ref 11.5–15.5)

## 2010-06-21 LAB — DIFFERENTIAL
Lymphs Abs: 2.1 10*3/uL (ref 0.7–4.0)
Monocytes Relative: 5 % (ref 3–12)
Neutro Abs: 4.5 10*3/uL (ref 1.7–7.7)
Neutrophils Relative %: 63 % (ref 43–77)

## 2010-06-30 LAB — CBC
HCT: 30.5 % — ABNORMAL LOW (ref 36.0–46.0)
HCT: 37 % (ref 36.0–46.0)
Hemoglobin: 10.6 g/dL — ABNORMAL LOW (ref 12.0–15.0)
MCV: 90.7 fL (ref 78.0–100.0)
MCV: 92.7 fL (ref 78.0–100.0)
Platelets: 249 10*3/uL (ref 150–400)
RBC: 4.08 MIL/uL (ref 3.87–5.11)
RDW: 12.3 % (ref 11.5–15.5)
WBC: 6.6 10*3/uL (ref 4.0–10.5)
WBC: 8.4 10*3/uL (ref 4.0–10.5)

## 2010-06-30 LAB — PREGNANCY, URINE: Preg Test, Ur: NEGATIVE

## 2010-08-02 NOTE — Discharge Summary (Signed)
[name], [name]                 ACCOUNT NO.:  [id number]   MEDICAL RECORD NO.:  [id number]          PATIENT TYPE:  INP   LOCATION:  9141                          FACILITY:  [facility]   PHYSICIAN:  Zenaida Niece, M.D.DATE OF BIRTH:  1980/11/06   DATE OF ADMISSION:  10/16/2006  DATE OF DISCHARGE:  10/18/2006                               DISCHARGE SUMMARY   ADMISSION DIAGNOSES:  1. Intrauterine pregnancy at 39 weeks.  2. Group B strep carrier.   DISCHARGE DIAGNOSES:  1. Intrauterine pregnancy at 39 weeks.  2. Group B strep carrier.   PROCEDURES:  On July 29 Angie [name] had a spontaneous vaginal delivery.   HISTORY OF PRESENT ILLNESS:  This is a 30 year old white female gravida  3, para 1-1-0-2 with an EGA of [redacted] weeks who presents for induction due  to severe back pain.  Prenatal care complicated by significant back pain  treated with Darvocet, Percocet, and Vicodin, but, otherwise,  uncomplicated.   PRENATAL LABORATORIES:  Blood type is O+ with negative antibody screen,  RPR nonreactive, rubella immune, hepatitis B surface antigen negative,  HIV negative, first trimester screen is normal.  Gonorrhea and chlamydia  is negative, 1-hour Glucola is 172, 3-hour GTT 81, 171, 80, and 127.  Group B strep is positive.   PAST OBSTETRIC HISTORY:  A 2003 vaginal delivery at 36 weeks, 6 pounds,  7 ounces complicated by preterm labor; 2004 vaginal delivery at 38  weeks, 8 pounds, no complications.   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   PAST MEDICAL HISTORY:  History of pyelonephritis, migraines, social  anxiety disorder, bronchitis, and back pain.   PAST SURGICAL HISTORY:  Tonsillectomy, adenoidectomy, and  cholecystectomy.   ALLERGIES:  CLARITIN and IBUPROFEN.   CURRENT MEDICATIONS:  Percocet.   PHYSICAL EXAMINATION:  VITAL SIGNS:  Afebrile with stable vital signs.  Fetal heart tracing is reactive with regular contractions on  Pitocin.  ABDOMEN:  Gravid, nontender with an estimated fetal weight of 8 pounds.  Cervix is 1+, 80, -2 vertex presentation.   HOSPITAL COURSE:  The patient was admitted and started on Pitocin and  also put on penicillin for group B strep prophylaxis.  Angie [name] progressed 2  cm, and amniotomy was performed which revealed mild to moderate  meconium.  Angie [name] eventually progressed into active labor.  Reached  complete, pushed well, and on the evening of July 29 had a vaginal  delivery of a viable female infant with Apgars 6 and 8 that weighed 8  pounds 12 ounces.  A loose nuchal cord x1 was reduced.  Placenta  delivered spontaneous, was intact, and was sent for cord blood  collection.  Angie [name] had small hemostatic bilateral labial lacerations which  were not repaired.  Estimated blood loss was less than 500 cc.  Postpartum the patient had no significant complications.  Predelivery  hemoglobin 12.5, postdelivery 11.1.  On postpartum day #2 Angie [name] was felt  to be stable enough for discharge home.   DISCHARGE INSTRUCTIONS:  Regular diet, pelvic rest, follow-up in  six  weeks.   MEDICATIONS:  Percocet #30 1-2 p.o. q.4-6 h. p.r.n. pain.   Angie [name] is given our discharge pamphlet.      Zenaida Niece, M.D.  Electronically Signed     TDM/MEDQ  D:  10/18/2006  T:  10/18/2006  Job:  [id number]

## 2010-08-02 NOTE — H&P (Signed)
NAME:  Angie [name], Angie [name]                 ACCOUNT NO.:  [id number]   MEDICAL RECORD NO.:  [id number]          PATIENT TYPE:  AMB   LOCATION:  SDC                           FACILITY:  [facility]   PHYSICIAN:  Zenaida Niece, M.D.DATE OF BIRTH:  22-Jul-1980   DATE OF ADMISSION:  06/11/2008  DATE OF DISCHARGE:                              HISTORY & PHYSICAL   CHIEF COMPLAINT:  Chronic pelvic pain.   HISTORY OF PRESENT ILLNESS:  This is a 30 year old female, para 2-1-0-3,  whom I have been following for chronic pelvic pain.  In December 2009,  she had a laparoscopy with fulguration of endometriosis.  However,  despite this she continues to have increasing pelvic pain and wishes to  proceed with definitive surgical therapy.  She is being admitted for  hysterectomy at this time.   PAST OB HISTORY:  Significant for three vaginal deliveries, one at 36  weeks, one at 38 weeks, one at 39 weeks without complications.   PAST MEDICAL HISTORY:  1. History of pyelonephritis.  2. Migraine headaches.  3. Social anxiety disorder.  4. Bronchitis.  5. Back pain.  6. PVCs.   PAST SURGICAL HISTORY:  1. Tonsillectomy.  2. Adenoidectomy.  3. Cholecystectomy.  4. The above mentioned laparoscopy with fulguration of endometriosis.  5. She has also had Essure placement.   ALLERGIES:  1. CLARITIN.  2. MOTRIN.   CURRENT MEDICATIONS:  1. Metformin 1350 mg p.o. daily.  2. Wellbutrin XL 150 mg p.o. daily.   SOCIAL HISTORY:  She is married and denies alcohol, tobacco or drug use.   FAMILY HISTORY:  No close GYN or colon cancer.   REVIEW OF SYSTEMS:  She has normal urinary function.  She does have  occasional loose stools.  She does have deep dyspareunia almost all the  time.   GYN HISTORY:  No history of abnormal Pap smears and she is under  treatment for polycystic ovarian syndrome.   PHYSICAL EXAMINATION:  GENERAL:  This is a well-developed female in no  acute distress.  Last weight in the office was  218 pounds.  NECK:  Supple without lymphadenopathy or thyromegaly.  LUNGS:  Clear to auscultation.  HEART:  Regular rate and rhythm without murmur.  ABDOMEN:  Soft, nondistended without palpable masses.  She is slightly  tender in both lower quadrants.  EXTREMITIES:  Have no edema and are nontender.  PELVIC:  External genitalia is without lesions.  Speculum exam reveals a  normal cervix.  On bimanual exam, she has a small anteverted to mid  planar slightly tender uterus and no adnexal masses, although she is  tender on both sides.   ASSESSMENT:  Chronic pelvic pain.  She has had a laparoscopy consistent  with endometriosis, but continues to have symptoms.  All medical and  surgical options have been discussed with the patient.  The patient is  at her wits end and wishes to proceed with definitive surgical therapy.  She declines any hormonal therapy due to emotional issues with similar  therapies in the past.  All surgical options and risks of surgery  have  been discussed.  Plan is to admit the patient on the day of surgery for  a laparoscopic-assisted vaginal hysterectomy and cystoscopy.  We will  leave her ovaries unless they appear abnormal.  She is on amoxicillin  for preop urine culture with group B strep.      Zenaida Niece, M.D.  Electronically Signed     TDM/MEDQ  D:  06/10/2008  T:  06/10/2008  Job:  [id number]

## 2010-08-02 NOTE — Op Note (Signed)
NAME:  Angie [name], Angie [name]                 ACCOUNT NO.:  [id number]   MEDICAL RECORD NO.:  [id number]          PATIENT TYPE:  AMB   LOCATION:  SDC                           FACILITY:  [facility]   PHYSICIAN:  Zenaida Niece, M.D.DATE OF BIRTH:  [birth year]   DATE OF PROCEDURE:  DATE OF DISCHARGE:                               OPERATIVE REPORT   PREOPERATIVE DIAGNOSIS:  Pelvic pain and dyspareunia.   POSTOPERATIVE DIAGNOSES:  Pelvic pain and dyspareunia and pelvic  endometriosis.   PROCEDURE:  Laparoscopy with fulguration of endometriosis.   SURGEON:  Zenaida Niece, MD   ANESTHESIA:  General endotracheal tube.   FINDINGS:  She had a normal upper abdomen.  She had a grossly normal  appearing pelvis with normal tubes and ovaries.  She had clear cystic  implants on the anterior and posterior uterus and some clear implants in  the posterior cul-de-sac and on the uterosacral ligaments all consistent  with endometriosis.   SPECIMENS:  None.   ESTIMATED BLOOD LOSS:  Minimal.   COMPLICATIONS:  None.   PROCEDURE IN DETAIL:  The patient was taken to the operating room and  placed in the dorsal supine position.  Left arm was tucked to her side.  General anesthesia was induced and legs were placed in mobile stirrups.  Abdomen was then prepped and draped in the usual sterile fashion,  bladder drained with a red Robinson catheter, Hulka tenaculum applied to  the cervix for uterine manipulation.  Infraumbilical skin was then  infiltrated with 0.25% Marcaine and a three-quarter centimeter vertical  incision was made.  The Veress needle was inserted in the peritoneal  cavity.  Placement was confirmed by the water drop test and opening  pressure of 2 mmHg.  CO2 gas was insufflated to a pressure of 12 mmHg  and the Veress needle was removed.  A 5-mm disposable trocar was then  introduced with direct visualization with a laparoscope.  A 5-mm port  was then placed on the left side also under direct  visualization.  Inspection revealed the above-mentioned findings.  She had a normal  retrocecal appendix.  Tubes and ovaries and uterus grossly were normal.  The clear implants on the uterus and the posterior cul-de-sac and  uterosacral ligaments were all fulgurated with bipolar cautery.  These  were well away from ureters.  No other source for her pain was  identified.  At the end of procedure, all sites appeared adequately  fulgurated and all sites were hemostatic.  The 5-mm port on the left  side was removed under direct visualization.  The laparoscope was then  removed.  All gas was allowed to deflate from the abdomen and the  umbilical trocar was then removed.  Skin incisions were closed with  interrupted subcuticular sutures of 4-0 Vicryl followed by Dermabond.  The Hulka  tenaculum was then removed.  The patient tolerated the procedure well.  She was taken down from stirrups, awakened in the operating room and  taken to the recovery room in stable condition.  Counts were correct and  she had PAS hose on  throughout the procedure.      Zenaida Niece, M.D.  Electronically Signed     TDM/MEDQ  D:  02/28/2008  T:  02/29/2008  Job:  [id number]

## 2010-08-02 NOTE — Op Note (Signed)
[name], [name]                 ACCOUNT NO.:  [id number]   MEDICAL RECORD NO.:  [id number]          PATIENT TYPE:  OIB   LOCATION:  9319                          FACILITY:  [facility]   PHYSICIAN:  Zenaida Niece, M.D.DATE OF BIRTH:  Sep 13, 1980   DATE OF PROCEDURE:  06/11/2008  DATE OF DISCHARGE:                               OPERATIVE REPORT   PREOPERATIVE DIAGNOSIS:  Chronic pelvic pain.   POSTOPERATIVE DIAGNOSIS:  Chronic pelvic pain.   PROCEDURES:  Laparoscopic-assisted vaginal hysterectomy and cystoscopy.   SURGEON:  Zenaida Niece, MD   ASSISTANT:  Malachi Pro. Ambrose Mantle, MD   ANESTHESIA:  General endotracheal tube.   FINDINGS:  She had a normal uterus, tubes, and ovaries.  There was a  small amount of endometriosis on the right uterosacral ligament and  small implants of endometriosis on the anterior abdominal wall.   SPECIMENS:  Uterus with cervix sent for routine pathology.   ESTIMATED BLOOD LOSS:  500 mL.   COMPLICATIONS:  None.   PROCEDURE IN DETAIL:  The patient was taken to the operating room and  placed in the dorsal supine position.  General anesthesia was induced.  Left arm was tucked to her side and legs were placed in mobile stirrups.  Abdomen, perineum, and vagina were then prepped and draped in the usual  sterile fashion, bladder drained with a Red Robinson catheter, Hulka  tenaculum applied to the cervix for uterine manipulation.  Infraumbilical skin was then infiltrated with 0.25% Marcaine and a three-  quarter centimeter vertical incision was made.  The Veress needle was  then inserted into the peritoneal cavity and placement confirmed by an  opening pressure of 5 mmHg.  CO2 gas was insufflated to a pressure of 14  mmHg and the Veress needle was removed.  A 5-mm trocar was then  introduced with direct visualization with the laparoscope.  5-mm ports  were then placed on each side also under direct visualization with the  laparoscope.  Inspection revealed  the above-mentioned findings.  Small  spots of endometriosis on the abdominal wall and right uterosacral  ligament.  Otherwise, the pelvis appeared normal.  Using a grasper or 5-  mm tenaculum on the uterus and the harmonic scalpel Ace, we proceeded  with laparoscopic-assisted vaginal hysterectomy.  Tubes and ovaries  appeared normal.  The fallopian tube was taken down on each side at a  point distal enough from the uterine cornu that it would remove the  Essure implant completely.  Round ligament, utero-ovarian ligament, and  broad ligaments were also taken down with harmonic scalpel Ace.  This  was first done on the left side.  Coming down closely to the uterine  artery, there was a fair amount of bleeding and this was eventually  controlled with a harmonic scalpel Ace.  We then proceeded to the right  side and did a similar procedure.  The anterior peritoneum was incised  across the lower anterior portion of the uterus to let the bladder fall  inferior.  Uterine artery was not taken down on the right side.   Attention  was turned vaginally.  Legs were elevated in stirrups and a  weighted speculum was placed.  The Hulka tenaculum was removed.  The  cervix was grasped with Christella Hartigan tenaculums.  The cervicovaginal mucosa  was infiltrated with a dilute solution of Pitressin.  This was then  incised circumferentially with electrocautery.  Sharp dissection was  then used to further free the vagina from the cervix.  The anterior  vagina was pushed off the cervix sharply and then bluntly.  The anterior  peritoneum was entered bluntly and a Deaver retractor used to retract  the bladder anterior.  The posterior cul-de-sac was then identified and  entered sharply and a bonnano speculum was placed into the posterior cul-  de-sac.  Uterosacral ligaments were clamped, transected, and ligated  with #1 chromic and tagged for later use.  Cardinal ligaments and  uterine arteries were clamped, transected,  and ligated on each side.  This freed up the patient's left side.  One further bite on the right  side freed the uterus.  Bleeding from the right uterine artery was  controlled with a figure-of-eight suture of #1 chromic.  The remaining  pedicles were hemostatic.  Uterosacral ligaments were then plicated in  the midline with 2-0 silk.  The previously tagged uterosacral pedicles  were also tied in the midline.  The vagina was then closed in a vertical  fashion with running locking 2-0 Vicryl with adequate closure and  adequate hemostasis.   Attention was turned to cystoscopy.  The patient had been given indigo  carmine IV.  A 70-degree cystoscope was inserted and good visualization  was achieved.  The bladder appeared intact.  Both ureteral orifices were  easily identified and had good efflux of blue-tinged urine.  The  cystoscope was removed and a Foley catheter was placed.   The legs were then lowered in the stirrups.  The scrub techs and myself  changed gloves.  The laparoscope was re-inserted and the abdomen re-  insufflated with CO2.  Pelvis was copiously irrigated.  A small amount  of bleeding from the left infundibulopelvic ligament was controlled with  the harmonic scalpel Ace.  The remaining pedicles were hemostatic.  All  trocars were then removed under direct visualization and all gas was  allowed to deflate from the abdomen.  Skin incisions were then closed  with interrupted subcuticular sutures of 4-0 Vicryl followed by  Dermabond.  The patient tolerated the procedure well.  She was taken to  the recovery room in stable condition after tolerating the procedure  well.  Counts were correct x2, she received Ancef 1 g IV at the  beginning of the procedure and had PAS hose on throughout the procedure.      Zenaida Niece, M.D.  Electronically Signed     TDM/MEDQ  D:  06/11/2008  T:  06/11/2008  Job:  [id number]

## 2010-08-02 NOTE — Assessment & Plan Note (Signed)
[facility]                            CARDIOLOGY OFFICE NOTE   INDIANA, GAMERO                          MRN:          [id number]  DATE:10/07/2007                            DOB:          03/26/1980    A 30 year old patient referred by Dr. Ethelda Chick from the [facility] ER  for palpitations and PVCs.   The patient is 30 years old.  Since Thursday, she has been having  palpitations.  They tend to occur when she wakes up in the morning.  They are exacerbated by exertion.  She has had occasional presyncope  with them.  She noticed rapid flip-flops, they have not been sustained  and rapid.  There is no associated diaphoresis.  She does get exertional  dyspnea.   About a year ago, there was a question of postpartum cardiomyopathy;  however, a 2D echo cardiogram that I saw from October 2008 showed normal  LV function with an EF of 55%.  At the time of delivery of her last  child, she had palpitations and had dyspnea.   She had a CT scan, which is negative for PE.   When she was seen in the emergency room last week on October 03, 2007, she  was discharged with a half of atenolol tablets.  She does not think that  this has done any good.   She was concerned that the tablets are not scored and that she was not  getting exactly the right dose of medicine.   She has not had frank chest pain.   REVIEW OF SYSTEMS:  Otherwise negative.  In particular, she has not  changed her diet.  She has not had any other stimulants or cold  medicines.  She quit smoking 2 years ago.   PAST MEDICAL HISTORY:  Relatively benign.  She has had her tonsils  removed and gallbladder removed.  She complains of anxiety and fatigue  and is on antidepressants.  There is a distant history of asthma, but  the beta-blockers have not exacerbated them.   SOCIAL HISTORY:  The patient is married.  She is a Scientist, physiological at a  veterinarian's office.  She has 3 children, ages 72,6, and 4.   She is  otherwise sedentary.  She quit smoking 2 years ago and does not drink.   FAMILY HISTORY:  Noncontributory.   ALLERGIES:  She is allergic to [facility] and MOTRIN.   MEDICATIONS:  She is on Wellbutrin 850 and atenolol 25 nightly.   PHYSICAL EXAMINATION:  GENERAL:  Remarkable for an overweight white  female in no distress.  VITAL SIGNS:  Weight is 231; blood pressure is 134/84; pulse 86 and  regular, and respiratory rate 14, afebrile.  Affect appropriate and  worried.  HEENT:  Unremarkable.  NECK:  Carotids are without bruit.  No lymphadenopathy, thyromegaly, or  JVP elevation.  LUNGS:  Clear, good diaphragmatic motion.  No wheezing.  HEART:  S1 and S2, normal heart sounds.  PMI normal.  ABDOMEN:  Benign.  Bowel sounds positive.  No triple A.  No tenderness.  No bruit.  No hepatosplenomegaly.  No hepatojugular reflux.  EXTREMITIES:  Distal pulses are intact.  No edema.  NEURO:  Nonfocal.  SKIN:  Warm and dry.  MUSCULOSKELETAL:  No muscular weakness.   EKG is normal.  QT interval is 367.   IMPRESSION:  1. Palpitations with premature ventricular contractions documented in      the emergency room.  Continue beta blocker.  Need to make sure that      there is no infiltrative cardiomyopathy and make sure that left      ventricular function is still normal.  Since she does not have a      murmur, I think a cardiac MRI would be the best test for this.  She      will also get an event monitor, particularly since she is having      them every day and they occur with exercise.  I will also refer her      for a stress Myoview.  I think it is important to rule out exercise-      induced ventricular tachycardia since she has been shown to have      premature ventricular contractions and her symptoms are worse with      exercise and are leading to presyncope.  2. Anxiety and depression.  Continue Wellbutrin.  3. Previous smoker.  Continue abstinence.  4. Central obesity, with weight  gain.  The patient is encouraged to      decrease her caloric intake and particularly carbohydrates.      Recommendation for [facility] Diet given.  Continue to increase      activity levels.   Follow up with primary-care MD.   I will see her back in 2 months unless some of her tests show  significant abnormalities or nonsustained VT.     Noralyn Pick. Eden Emms, MD, [facility]  Electronically Signed    PCN/MedQ  DD: 10/07/2007  DT: 10/07/2007  Job #: [id number]

## 2010-08-05 NOTE — Discharge Summary (Signed)
NAME:  Angie Brown, Angie Brown                           ACCOUNT NO.:  [id number]   MEDICAL RECORD NO.:  [id number]                   PATIENT TYPE:  INP   LOCATION:  9142                                 FACILITY:  [facility]   PHYSICIAN:  Zenaida Niece, M.D.             DATE OF BIRTH:  07/27/1980   DATE OF ADMISSION:  03/10/2003  DATE OF DISCHARGE:  03/12/2003                                 DISCHARGE SUMMARY   ADMISSION DIAGNOSES:  Intrauterine pregnancy at 38 weeks.   DISCHARGE DIAGNOSES:  Intrauterine pregnancy at 38 weeks.   PROCEDURE:  On December 21 she had a precipitous vaginal delivery.   HISTORY AND PHYSICAL:  This is a 30 year old white female gravida 2, para 0-  1-0-1 with an EGA of 38+ weeks who presents for elective induction.  Prenatal care complicated by multiple visits for multiple complaints, most  recently on the day prior to admission for right arm numbness and swelling.  She did have normal right upper extremity Doppler examination for that.   PRENATAL LABORATORIES:  Blood type is O+ with a negative antibody screen.  RPR nonreactive.  Rubella immune.  Hepatitis B surface antigen negative.  Gonorrhea and Chlamydia negative.  Triple screen normal.  One-hour Glucola  153.  Three-hour GTT 95, 142, 120, and 107.  Group B Strep is negative.   PAST OBSTETRICAL HISTORY:  January of 2003 vaginal delivery at 36 weeks 6  pounds 7 ounces complicated by preterm labor.   GYN HISTORY:  History of low grade SIL Pap smear with a normal colposcopy.   PAST MEDICAL HISTORY:  1. History of pyelonephritis with her past pregnancy.  2. Migraines.  3. Social anxiety disorder.  4. Chronic bronchitis.   PAST SURGICAL HISTORY:  Tonsillectomy.   ALLERGIES:  CLARITIN, IBUPROFEN.   MEDICATIONS:  1. Zoloft 100 mg daily.  2. Zantac 150 mg b.i.d.  3. Albuterol inhaler p.r.n.   SOCIAL HISTORY:  She is married and reports that she quit smoking with  pregnancy.   PHYSICAL EXAMINATION:  VITAL  SIGNS:  She is afebrile with stable vital  signs.  Fetal heart tracing reactive with irregular contractions.  ABDOMEN:  Gravid, nontender with an estimated fetal weight of 7.5 pounds.  PELVIC:  Vaginal examination was 1, 30, -3, vertex presentation.   HOSPITAL COURSE:  The patient was admitted and started on Pitocin induction.  When she reached 3 cm amniotomy was performed and this revealed clear fluid.  She then received an epidural.  She then had a slightly protracted course to  4 cm, but then progressed quickly and had a precipitous vaginal delivery of  a viable female infant with Apgars of 8 and 8 that weighed 8 pounds 8 ounces.  She had bilateral labial lacerations with repair on the right with local  block.  Estimated blood loss 500 mL.  Placenta delivered spontaneous and was  intact.  Postpartum she had  no complications.  Pre delivery hemoglobin 12.0,  post delivery 10.2.  On the morning of postpartum day #2 she was stable for  discharge home.   DISCHARGE INSTRUCTIONS:  Regular diet.  Pelvic rest.  Follow up in four to  six weeks.  Medications are Percocet #20 one to two p.o. q.4-6h. p.r.n. pain  and Micronor to start the first week in January.  She is also given our  discharge pamphlet.                                               Zenaida Niece, M.D.    TDM/MEDQ  D:  03/12/2003  T:  03/12/2003  Job:  [id number]

## 2010-08-05 NOTE — Discharge Summary (Signed)
[facility]  Patient:    Angie Brown, Angie Brown Visit Number: [id number] MRN: [id number]          Service Type: OBS Location: [facility] Attending Physician:  Tammi Sou Dictated by:   Marlinda Mike, CNM Admit Date:  03/10/2001 Discharge Date: 03/10/2001                             Discharge Summary  ADMISSION DIAGNOSES: 1. Intrauterine pregnancy at 29-4/7 weeks, preterm labor. 2. Possible evolving pregnancy induced hypertension.  DISCHARGE DIAGNOSES: 1. Intrauterine pregnancy at 29 weeks. 2. ______  preterm labor without evidence of preterm labor, cervix stable.    Fetal heart rate reactive.  Blood pressure stable.  HISTORY OF PRESENT ILLNESS:  The patient is a 30 year old primigravida with estimated date of confinement of 04/18/01.  The patient is receiving prenatal care at [facility], [facility], here in [place] visiting family, in for preterm labor evaluation.  Reports increasing contractions.  ALLERGIES:  CLARITIN, MOTRIN, DEMEROL.  OBSTETRICAL HISTORY:  Blood type O+, antibody screen negative, serology nonreactive, rubella immune, hepatitis negative, HIV negative.  Group beta strep pending.  PAST MEDICAL HISTORY: 1. History of pyelonephritis. 2. Cholecystectomy on 11/22/00, just prior to pregnancy.  The patient is admitted for preterm contractions, and also elevated blood pressures.  HOSPITAL COURSE:  The patient received IV antibiotics, Unasyn x48 hours.  Also underwent OB ultrasound which revealed singleton vertex presentation at 90th percentile, 17.1 AFI, with a cervical length of 3.0 cm.  The patient also underwent fetal fibronectin testing which was negative.  Cervix on admission was closed, 50%, with irregular contractions with a reactive fetal heart rate. Discharged on 11/24, with irregular contractions, uterine irritability, reactive non-stress test, cervix unchanged, closed, and long.  The patient is discharged home  to follow up with primary physician at [facility]. Dictated by:   Marlinda Mike, CNM Attending Physician:  Tammi Sou DD:  03/28/01 TD:  03/28/01 Job: [id number]

## 2010-12-16 LAB — URINALYSIS, ROUTINE W REFLEX MICROSCOPIC
Bilirubin Urine: NEGATIVE
Hgb urine dipstick: NEGATIVE
Ketones, ur: NEGATIVE
Nitrite: NEGATIVE
pH: 7.5

## 2010-12-16 LAB — POCT I-STAT, CHEM 8
BUN: 6
Calcium, Ion: 1.12
Chloride: 106
Creatinine, Ser: 0.9
Glucose, Bld: 99
TCO2: 24

## 2010-12-16 LAB — POCT PREGNANCY, URINE
Operator id: 294341
Preg Test, Ur: NEGATIVE

## 2010-12-16 LAB — URINE CULTURE

## 2010-12-16 LAB — URINE MICROSCOPIC-ADD ON

## 2010-12-22 LAB — CBC
HCT: 39.8 % (ref 36.0–46.0)
Hemoglobin: 13.8 g/dL (ref 12.0–15.0)
MCHC: 34.6 g/dL (ref 30.0–36.0)
MCV: 92.1 fL (ref 78.0–100.0)
Platelets: 309 10*3/uL (ref 150–400)
RDW: 12.8 % (ref 11.5–15.5)

## 2011-01-02 LAB — COMPREHENSIVE METABOLIC PANEL
ALT: 12
AST: 22
CO2: 32
Calcium: 8.7
Creatinine, Ser: 0.77
GFR calc Af Amer: >60
GFR calc non Af Amer: >60
Sodium: 139
Total Protein: 5.6 — ABNORMAL LOW

## 2011-01-02 LAB — URINALYSIS, ROUTINE W REFLEX MICROSCOPIC
Bilirubin Urine: NEGATIVE
Glucose, UA: NEGATIVE
Ketones, ur: NEGATIVE
Nitrite: NEGATIVE
Nitrite: NEGATIVE
Specific Gravity, Urine: 1.01
Specific Gravity, Urine: 1.02
Urobilinogen, UA: 0.2
pH: 7
pH: 7

## 2011-01-02 LAB — URINE MICROSCOPIC-ADD ON

## 2011-01-02 LAB — CBC
MCHC: 33.8
MCHC: 34
MCHC: 34.8
MCV: 88.7
Platelets: 379
Platelets: 215
RBC: 3.82 — ABNORMAL LOW
RDW: 13.7
RDW: 13.8
RDW: 13.8
RDW: 14

## 2011-01-02 LAB — DIFFERENTIAL
Basophils Relative: 1
Eosinophils Absolute: 0.2
Monocytes Absolute: 0.3
Monocytes Relative: 5
Neutrophils Relative %: 68

## 2011-01-02 LAB — RPR: RPR Ser Ql: NONREACTIVE

## 2011-01-02 LAB — CCBB MATERNAL DONOR DRAW

## 2011-01-03 LAB — URINALYSIS, ROUTINE W REFLEX MICROSCOPIC
Nitrite: NEGATIVE
Specific Gravity, Urine: 1.01
Urobilinogen, UA: 0.2

## 2011-01-05 LAB — URINALYSIS, ROUTINE W REFLEX MICROSCOPIC
Hgb urine dipstick: NEGATIVE
Protein, ur: NEGATIVE
Urobilinogen, UA: 0.2

## 2011-01-05 LAB — FETAL FIBRONECTIN: Fetal Fibronectin: NEGATIVE

## 2011-03-02 ENCOUNTER — Other Ambulatory Visit: Payer: Self-pay | Admitting: Neurosurgery

## 2011-03-02 DIAGNOSIS — M545 Low back pain, unspecified: Secondary | ICD-10-CM

## 2011-03-23 ENCOUNTER — Ambulatory Visit
Admission: RE | Admit: 2011-03-23 | Discharge: 2011-03-23 | Disposition: A | Discharge status: Home / Self Care | Payer: Commercial Managed Care - PPO | Source: Ambulatory Visit | Attending: Neurosurgery | Admitting: Neurosurgery

## 2011-03-23 DIAGNOSIS — M545 Low back pain, unspecified: Secondary | ICD-10-CM

## 2011-04-14 ENCOUNTER — Emergency Department (HOSPITAL_COMMUNITY)
Admission: EM | Admit: 2011-04-14 | Discharge: 2011-04-14 | Disposition: A | Discharge status: Home / Self Care | Payer: Commercial Managed Care - PPO | Attending: Emergency Medicine | Admitting: Emergency Medicine

## 2011-04-14 ENCOUNTER — Encounter (HOSPITAL_COMMUNITY): Payer: Self-pay | Admitting: Emergency Medicine

## 2011-04-14 ENCOUNTER — Emergency Department (HOSPITAL_COMMUNITY): Payer: Commercial Managed Care - PPO

## 2011-04-14 DIAGNOSIS — J45909 Unspecified asthma, uncomplicated: Secondary | ICD-10-CM | POA: Insufficient documentation

## 2011-04-14 DIAGNOSIS — R6889 Other general symptoms and signs: Secondary | ICD-10-CM | POA: Insufficient documentation

## 2011-04-14 DIAGNOSIS — Z79899 Other long term (current) drug therapy: Secondary | ICD-10-CM | POA: Insufficient documentation

## 2011-04-14 DIAGNOSIS — R0989 Other specified symptoms and signs involving the circulatory and respiratory systems: Secondary | ICD-10-CM

## 2011-04-14 HISTORY — DX: Bronchitis, not specified as acute or chronic: J40

## 2011-04-14 MED ORDER — LIDOCAINE VISCOUS 2 % MT SOLN: 20.0000 mL | OROMUCOSAL | Status: AC | PRN

## 2011-04-14 MED ORDER — LIDOCAINE VISCOUS 2 % MT SOLN
20.0000 mL | OROMUCOSAL | Status: AC
  Administered 2011-04-14: 20 mL via OROMUCOSAL
  Filled 2011-04-14: qty 20

## 2011-04-14 MED ORDER — BUTAMBEN-TETRACAINE-BENZOCAINE 2-2-14 % EX AERO
INHALATION_SPRAY | CUTANEOUS | Status: AC
  Filled 2011-04-14: qty 56

## 2011-04-14 NOTE — Discharge Instructions (Signed)
Chest x-ray was negative.  No remaining foreign body was seen on examination.  If symptoms continue followup with the physician as directed.

## 2011-04-14 NOTE — ED Notes (Signed)
md at bedside using glydoscope to visualize pt's upper gi

## 2011-04-14 NOTE — ED Notes (Signed)
Pt tolerated po fluids.  md made aware.

## 2011-04-14 NOTE — ED Provider Notes (Addendum)
History     CSN: [id number]  Arrival date & time 04/14/11  1441   First MD Initiated Contact with Patient 04/14/11 1504      Chief Complaint  Patient presents with  . Choking     HPI Per ems, "at 1345 today the pt became choked on a [place] sandwich. The first medic on scene performed the heimlich twice with no food bolus being produced. Pt states she still has a choking sensation."   Past Medical History  Diagnosis Date  . Asthma   . Bronchitis     No past surgical history on file.  No family history on file.  History  Substance Use Topics  . Smoking status: Not on file  . Smokeless tobacco: Not on file  . Alcohol Use:     OB History    Grav Para Term Preterm Abortions TAB SAB Ect Mult Living                  Review of Systems Negative except as noted in history of present illness Allergies  Fluoxetine hcl; Ibuprofen; Loratadine; and Sumatriptan  Home Medications   Current Outpatient Rx  Name Route Sig Dispense Refill  . BACLOFEN 10 MG PO TABS Oral Take 10 mg by mouth 3 (three) times daily.    Marland Kitchen GABAPENTIN 300 MG PO CAPS Oral Take 300 mg by mouth at bedtime.    Marland Kitchen LEVALBUTEROL TARTRATE 45 MCG/ACT IN AERO Inhalation Inhale 1-2 puffs into the lungs every 4 (four) hours as needed. For shortness of breath.    . NABUMETONE 750 MG PO TABS Oral Take 750 mg by mouth 2 (two) times daily.    Marland Kitchen PAROXETINE HCL 20 MG PO TABS Oral Take 20 mg by mouth daily.    Marland Kitchen TAPENTADOL HCL 100 MG PO TABS Oral Take 1 tablet by mouth every 6 (six) hours as needed. For pain.      BP 135/85  Pulse 77  Resp 14  SpO2 100%  Physical Exam  Nursing note and vitals reviewed. Constitutional: She is oriented to person, place, and time. She appears well-developed and well-nourished. No distress.  HENT:  Head: Normocephalic and atraumatic.  Mouth/Throat: Uvula is midline, oropharynx is clear and moist and mucous membranes are normal.       Direct visualization with Glidescope  camera does  not reveal any foreign bodies.  Eyes: Pupils are equal, round, and reactive to light.  Neck: Normal range of motion.  Cardiovascular: Normal rate and intact distal pulses.   Pulmonary/Chest: No respiratory distress.  Abdominal: Normal appearance. She exhibits no distension.  Musculoskeletal: Normal range of motion.  Neurological: She is alert and oriented to person, place, and time. No cranial nerve deficit.  Skin: Skin is warm and dry. No rash noted.  Psychiatric: She has a normal mood and affect. Her behavior is normal.    ED Course  Procedures (including critical care time)  Labs Reviewed - No data to display Dg Chest 2 View  04/14/2011  *RADIOLOGY REPORT*  Clinical Data: Choking, possible aspiration, history smoking and asthma  CHEST - 2 VIEW  Comparison: 03/17/2010  Findings: Normal heart size, mediastinal contours, and pulmonary vascularity. Lungs clear. Bones unremarkable. No pneumothorax.  IMPRESSION: Normal exam.  Original Report Authenticated By: Lollie Marrow, M.D.     1. Choking episode       MDM          Nelia Shi, MD 04/14/11 1758  Nelia Shi, MD  04/14/11 1758 

## 2011-04-14 NOTE — ED Notes (Signed)
Patient transported to X-ray 

## 2011-04-14 NOTE — ED Notes (Signed)
NFA:[id number]<QM> Expected date:04/14/11<BR> Expected time: 2:24 PM<BR> Means of arrival:Ambulance<BR> Comments:<BR> M62 - 30yoF Choking sensation after coughing, airway clear

## 2011-04-14 NOTE — ED Notes (Signed)
Per ems, "at 1345 today the pt became choked on a [place] sandwich.  The first medic on scene performed the heimlich twice with no food bolus being produced.  Pt states she still has a choking sensation."

## 2011-05-14 ENCOUNTER — Encounter (HOSPITAL_COMMUNITY): Payer: Self-pay | Admitting: Adult Health

## 2011-05-14 ENCOUNTER — Emergency Department (HOSPITAL_COMMUNITY): Payer: Commercial Managed Care - PPO

## 2011-05-14 ENCOUNTER — Emergency Department (HOSPITAL_COMMUNITY)
Admission: EM | Admit: 2011-05-14 | Discharge: 2011-05-14 | Disposition: A | Discharge status: Home / Self Care | Payer: Commercial Managed Care - PPO | Attending: Emergency Medicine | Admitting: Emergency Medicine

## 2011-05-14 DIAGNOSIS — I872 Venous insufficiency (chronic) (peripheral): Secondary | ICD-10-CM

## 2011-05-14 DIAGNOSIS — J45909 Unspecified asthma, uncomplicated: Secondary | ICD-10-CM | POA: Insufficient documentation

## 2011-05-14 DIAGNOSIS — Z79899 Other long term (current) drug therapy: Secondary | ICD-10-CM | POA: Insufficient documentation

## 2011-05-14 DIAGNOSIS — M549 Dorsalgia, unspecified: Secondary | ICD-10-CM | POA: Insufficient documentation

## 2011-05-14 LAB — COMPREHENSIVE METABOLIC PANEL
AST: 41 U/L — ABNORMAL HIGH (ref 0–37)
CO2: 29 mEq/L (ref 19–32)
Calcium: 8.5 mg/dL (ref 8.4–10.5)
Creatinine, Ser: 0.86 mg/dL (ref 0.50–1.10)
GFR calc non Af Amer: 89 mL/min — ABNORMAL LOW (ref >90)

## 2011-05-14 MED ORDER — ONDANSETRON 4 MG PO TBDP: 4.0000 mg | ORAL_TABLET | Freq: Three times a day (TID) | ORAL | Status: AC | PRN

## 2011-05-14 MED ORDER — KETOROLAC TROMETHAMINE 60 MG/2ML IM SOLN
60.0000 mg | Freq: Once | INTRAMUSCULAR | Status: AC
  Administered 2011-05-14: 60 mg via INTRAMUSCULAR
  Filled 2011-05-14: qty 2

## 2011-05-14 MED ORDER — OXYCODONE-ACETAMINOPHEN 5-325 MG PO TABS: 1.0000 | ORAL_TABLET | Freq: Four times a day (QID) | ORAL | Status: AC | PRN

## 2011-05-14 MED ORDER — ONDANSETRON 4 MG PO TBDP
4.0000 mg | ORAL_TABLET | Freq: Once | ORAL | Status: AC
  Administered 2011-05-14: 4 mg via ORAL
  Filled 2011-05-14: qty 1

## 2011-05-14 NOTE — ED Notes (Signed)
Presents with back pain on right side and bilateral leg swelling. +2 pedal pulses. Swelling and pain began today.

## 2011-05-14 NOTE — Discharge Instructions (Signed)
Elevate legs, wear compression stalking's and decreased salt intake to help with symptoms. Please follow-up with Dr. Jonny Ruiz regarding these symptoms to ensure resolution.    Venous Stasis and Venous Insufficiency As people age, the veins located in their legs may weaken and stretch. When veins weaken and lose the ability to pump blood effectively, the condition is called chronic venous insufficiency (CVI) or venous stasis. Almost all veins return blood back to the heart. This happens by:  The force of the heart pumping fresh blood pushes blood back to the heart.   Blood flowing to the heart from the force of gravity.  In the deep veins of the legs, blood has to fight gravity and flow upstream back to the heart. Here, the leg muscles contract to pump blood back toward the heart. Vein walls are elastic, and many veins have small valves that only allow blood to flow in one direction. When leg muscles contract, they push inward against the elastic vein walls. This squeezes blood upward, opens the valves, and moves blood toward the heart. When leg muscles relax, the vein wall also relaxes and the valves inside the vein close to prevent blood from flowing backward. This method of pumping blood out of the legs is called the venous pump. CAUSES  The venous pump works best while walking and leg muscles are contracting. But when a person sits or stands, blood pressure in leg veins can build. Deep veins are usually able to withstand short periods of inactivity, but long periods of inactivity (and increased pressure) can stretch, weaken, and damage vein walls. High blood pressure can also stretch and damage vein walls. The veins may no longer be able to pump blood back to the heart. Venous hypertension (high blood pressure inside veins) that lasts over time is a primary cause of CVI. CVI can also be caused by:   Deep vein thrombosis, a condition where a thrombus (blood clot) blocks blood flow in a vein.    Phlebitis, an inflammation of a superficial vein that causes a blood clot to form.  Other risk factors for CVI may include:   Heredity.   Obesity.   Pregnancy.   Sedentary lifestyle.   Smoking.   Jobs requiring long periods of standing or sitting in one place.   Age and gender:   Women in their 59's and 62's and men in their 17's are more prone to developing CVI.  SYMPTOMS  Symptoms of CVI may include:   Varicose veins.   Ulceration or skin breakdown.   Lipodermatosclerosis, a condition that affects the skin just above the ankle, usually on the inside surface. Over time the skin becomes brown, smooth, tight and often painful. Those with this condition have a high risk of developing skin ulcers.   Reddened or discolored skin on the leg.   Swelling.  DIAGNOSIS  Your caregiver can diagnose CVI after performing a careful medical history and physical examination. To confirm the diagnosis, the following tests may also be ordered:   Duplex ultrasound.   Plethysmography (tests blood flow).   Venograms (x-ray using a special dye).  TREATMENT The goals of treatment for CVI are to restore a person to an active life and to minimize pain or disability. Typically, CVI does not pose a serious threat to life or limb, and with proper treatment most people with this condition can continue to lead active lives. In most cases, mild CVI can be treated on an outpatient basis with simple procedures. Treatment methods include:  Elastic compression socks.   Sclerotherapy, a procedure involving an injection of a material that "dissolves" the damaged veins. Other veins in the network of blood vessels take over the function of the damaged veins.   Vein stripping (an older procedure less commonly used).   Laser Ablation surgery.   Valve repair.  HOME CARE INSTRUCTIONS   Elastic compression socks must be worn every day. They can help with symptoms and lower the chances of the problem  getting worse, but they do not cure the problem.   Only take over-the-counter or prescription medicines for pain, discomfort, or fever as directed by your caregiver.   Your caregiver will review your other medications with you.  SEEK MEDICAL CARE IF:   You are confused about how to take your medications.   There is redness, swelling, or increasing pain in the affected area.   There is a red streak or line that extends up or down from the affected area.   There is a breakdown or loss of skin in the affected area, even if the breakdown is small.   You develop an unexplained oral temperature above 102 F (38.9 C).   There is an injury to the affected area.  SEEK IMMEDIATE MEDICAL CARE IF:   There is an injury and open wound to the affected area.   Pain is not adequately relieved with pain medication prescribed or becomes severe.   An oral temperature above 102 F (38.9 C) develops.   The foot/ankle below the affected area becomes suddenly numb or the area feels weak and hard to move.  MAKE SURE YOU:   Understand these instructions.   Will watch your condition.   Will get help right away if you are not doing well or get worse.  Document Released: 07/10/2006 Document Revised: 11/16/2010 Document Reviewed: 09/17/2006 [facility] Patient Information 2012 [place], [place].

## 2011-05-14 NOTE — ED Provider Notes (Signed)
History     CSN: [id number]  Arrival date & time 05/14/11  1358   First MD Initiated Contact with Patient 05/14/11 1506      Chief Complaint  Patient presents with  . Back Pain    (Consider location/radiation/quality/duration/timing/severity/associated sxs/prior treatment) HPI  Pt presents to the ED with complaints of worsening back pain and bilateral lower extremity swelling. She has a history of back pain, having had surgery on her back in the past. She describes her leg swelling as acute onset starting today. She describes them as aching and painful when touched. She denies fevers, nausea, vomiting, diarrhea. She does have some complaints of shortness of breath. Pt admits to having these symptoms before and was given a dose of lasix. Pt is in no acute distress at the time.  Past Medical History  Diagnosis Date  . Asthma   . Bronchitis     History reviewed. No pertinent past surgical history.  History reviewed. No pertinent family history.  History  Substance Use Topics  . Smoking status: Not on file  . Smokeless tobacco: Not on file  . Alcohol Use:     OB History    Grav Para Term Preterm Abortions TAB SAB Ect Mult Living                  Review of Systems  All other systems reviewed and are negative.    Allergies  Loratadine; Fluoxetine hcl; Ibuprofen; and Sumatriptan  Home Medications   Current Outpatient Rx  Name Route Sig Dispense Refill  . BACLOFEN 10 MG PO TABS Oral Take 10 mg by mouth 3 (three) times daily.    Marland Kitchen CETIRIZINE HCL 10 MG PO TABS Oral Take 10 mg by mouth daily.    Marland Kitchen GABAPENTIN 300 MG PO CAPS Oral Take 300 mg by mouth at bedtime.    Marland Kitchen LEVALBUTEROL TARTRATE 45 MCG/ACT IN AERO Inhalation Inhale 1-2 puffs into the lungs every 4 (four) hours as needed. For shortness of breath.    . NABUMETONE 750 MG PO TABS Oral Take 750 mg by mouth 2 (two) times daily.    Marland Kitchen PAROXETINE HCL 20 MG PO TABS Oral Take 20 mg by mouth daily.    Marland Kitchen PROMETHAZINE HCL  25 MG PO TABS Oral Take 25 mg by mouth every 6 (six) hours as needed. For nausea    . RANITIDINE HCL 150 MG PO TABS Oral Take 150 mg by mouth 2 (two) times daily.    Marland Kitchen TAPENTADOL HCL 100 MG PO TABS Oral Take 1 tablet by mouth every 6 (six) hours as needed. For pain.    . OXYCODONE-ACETAMINOPHEN 5-325 MG PO TABS Oral Take 1 tablet by mouth every 6 (six) hours as needed for pain. 15 tablet 0    BP 112/78  Pulse 88  Temp(Src) 98.2 F (36.8 C) (Oral)  Resp 20  SpO2 99%  Physical Exam  Nursing note and vitals reviewed. Constitutional: She is oriented to person, place, and time. She appears well-developed and well-nourished. No distress.  HENT:  Head: Normocephalic and atraumatic.  Eyes: Conjunctivae are normal. Pupils are equal, round, and reactive to light.  Neck: Trachea normal, normal range of motion and full passive range of motion without pain. Neck supple.  Cardiovascular: Normal rate, regular rhythm and normal pulses.   Pulmonary/Chest: Effort normal and breath sounds normal. Chest wall is not dull to percussion. She exhibits no tenderness, no crepitus, no edema, no deformity and no retraction.  Abdominal: Soft.  Normal appearance and bowel sounds are normal.  Musculoskeletal: Normal range of motion. She exhibits edema and tenderness (tenderness to palpation of lwoer extremities).       Right lower leg: She exhibits tenderness and edema (mild pitting edema). She exhibits no bony tenderness, no swelling, no deformity and no laceration.       Left lower leg: She exhibits tenderness and edema (some mild pitting edema). She exhibits no bony tenderness, no swelling, no deformity and no laceration.       Legs: Neurological: She is oriented to person, place, and time. She has normal strength.  Skin: Skin is warm, dry and intact. She is not diaphoretic.  Psychiatric: Her speech is normal. Cognition and memory are normal.    ED Course  Procedures (including critical care time)  Labs  Reviewed  COMPREHENSIVE METABOLIC PANEL - Abnormal; Notable for the following:    AST 41 (*) SLIGHT HEMOLYSIS   Total Bilirubin 0.2 (*)    GFR calc non Af Amer 89 (*)    All other components within normal limits   Dg Chest 2 View  05/14/2011  *RADIOLOGY REPORT*  Clinical Data: 31 year old female with shortness of breath and lower extremity swelling.  CHEST - 2 VIEW  Comparison: 04/14/2011 and prior chest radiographs  Findings: The cardiomediastinal silhouette is unremarkable. The lungs are clear. There is no evidence of focal airspace disease, pulmonary edema, suspicious pulmonary nodule/mass, pleural effusion, or pneumothorax. No acute bony abnormalities are identified.  IMPRESSION: No evidence of active cardiopulmonary disease.  Original Report Authenticated By: Rosendo Gros, M.D.     1. Venous insufficiency of leg       MDM  Discussed patient with Dr. Patria Mane, diff diag of renal failure, bilat DVTs, CHF r/o.  Pt instructed to decrease salt intake, wear compression stalking's, elevate legs. PT can follow-up with PCP.  Pt given Percocets for acute on chronic back pain exacerbation as well as Zofran.      Dorthula Matas, PA 05/14/11 1724

## 2011-05-15 NOTE — ED Provider Notes (Signed)
Medical screening examination/treatment/procedure(s) were performed by non-physician practitioner and as supervising physician I was immediately available for consultation/collaboration.   Lyanne Co, MD 05/15/11 0100

## 2011-12-10 ENCOUNTER — Emergency Department (HOSPITAL_COMMUNITY)
Admission: EM | Admit: 2011-12-10 | Discharge: 2011-12-10 | Disposition: A | Discharge status: Home / Self Care | Payer: Commercial Managed Care - PPO | Attending: Emergency Medicine | Admitting: Emergency Medicine

## 2011-12-10 ENCOUNTER — Emergency Department (HOSPITAL_COMMUNITY): Payer: Commercial Managed Care - PPO

## 2011-12-10 ENCOUNTER — Encounter (HOSPITAL_COMMUNITY): Payer: Self-pay | Admitting: *Deleted

## 2011-12-10 DIAGNOSIS — J45909 Unspecified asthma, uncomplicated: Secondary | ICD-10-CM | POA: Insufficient documentation

## 2011-12-10 DIAGNOSIS — M549 Dorsalgia, unspecified: Secondary | ICD-10-CM

## 2011-12-10 DIAGNOSIS — Z79899 Other long term (current) drug therapy: Secondary | ICD-10-CM | POA: Insufficient documentation

## 2011-12-10 MED ORDER — DIPHENHYDRAMINE HCL 50 MG/ML IJ SOLN
12.5000 mg | Freq: Once | INTRAMUSCULAR | Status: AC
  Administered 2011-12-10: 12.5 mg via INTRAVENOUS
  Filled 2011-12-10: qty 1

## 2011-12-10 MED ORDER — HYDROMORPHONE HCL PF 1 MG/ML IJ SOLN
1.0000 mg | Freq: Once | INTRAMUSCULAR | Status: AC
  Administered 2011-12-10: 1 mg via INTRAVENOUS
  Filled 2011-12-10: qty 1

## 2011-12-10 MED ORDER — PROMETHAZINE HCL 25 MG PO TABS: 12.5000 mg | ORAL_TABLET | Freq: Four times a day (QID) | ORAL | Status: DC | PRN

## 2011-12-10 MED ORDER — BACLOFEN 20 MG PO TABS
20.0000 mg | ORAL_TABLET | Freq: Three times a day (TID) | ORAL | Status: DC
Start: 2011-12-10 — End: 2012-02-01

## 2011-12-10 MED ORDER — ONDANSETRON HCL 4 MG/2ML IJ SOLN
4.0000 mg | Freq: Once | INTRAMUSCULAR | Status: AC
  Administered 2011-12-10: 4 mg via INTRAVENOUS
  Filled 2011-12-10: qty 2

## 2011-12-10 MED ORDER — OXYCODONE-ACETAMINOPHEN 5-325 MG PO TABS: 1.0000 | ORAL_TABLET | ORAL | Status: DC | PRN

## 2011-12-10 MED ORDER — KETOROLAC TROMETHAMINE 30 MG/ML IJ SOLN
30.0000 mg | Freq: Once | INTRAMUSCULAR | Status: AC
  Administered 2011-12-10: 30 mg via INTRAVENOUS
  Filled 2011-12-10: qty 1

## 2011-12-10 NOTE — ED Notes (Signed)
Patient states she thinks she hurt her back.  She reports onset of leg swelling and now numbness in both legs.  Patient states she noted soreness in here back and she is having increased pain today.  Patient has hx of back surgery 2 yrs ago

## 2011-12-10 NOTE — ED Notes (Signed)
Pt presents to department for evaluation of lower back pain. Onset yesterday after riding horse x9 hours on Friday. 8/10 pain at the time, increases with movement. States pain radiates back of both legs. Able to move all extremities. She is alert and oriented x4. No signs of distress noted at the time.

## 2011-12-10 NOTE — ED Notes (Signed)
Patient complaining of itching from the pain medication; PA notified.  Patient has no other questions or concerns at this time.  Patient aware of plan of care; patient to be discharged home once pain is controlled.  Will continue to monitor.

## 2011-12-10 NOTE — ED Provider Notes (Signed)
History     CSN: [id number]  Arrival date & time 12/10/11  1458   First MD Initiated Contact with Patient 12/10/11 1734      Chief Complaint  Patient presents with  . Back Pain    (Consider location/radiation/quality/duration/timing/severity/associated sxs/prior treatment) HPI Comments: Angie Brown is a 31 y.o. Female who presents with complaint of back pain. States she was riding horses two days ago. States thinks she "over did it." States pain in lumbar area since the, radiating down both legs. States "my legs feel swollen.' States took percocet yesterday with no relief. Denies fever, abdominal pain, numbness to the legs, urinary retention, loss of bowels. Pt states hx of lumbar fusion 2 years ago by Dr. Jordan Likes, pain in that same area.    Past Medical History  Diagnosis Date  . Asthma   . Bronchitis     Past Surgical History  Procedure Date  . Back surgery   . Cholecystectomy   . Tonsillectomy   . Abdominal hysterectomy     No family history on file.  History  Substance Use Topics  . Smoking status: Current Every Day Smoker  . Smokeless tobacco: Not on file  . Alcohol Use: Yes    OB History    Grav Para Term Preterm Abortions TAB SAB Ect Mult Living                  Review of Systems  Constitutional: Negative for fever and chills.  HENT: Negative for neck pain and neck stiffness.   Respiratory: Negative.   Cardiovascular: Negative.   Gastrointestinal: Negative.   Genitourinary: Negative for dysuria.  Musculoskeletal: Positive for myalgias, back pain and joint swelling.  Skin: Negative.   Neurological: Negative for dizziness and weakness.  Hematological: Negative.   Psychiatric/Behavioral: Negative.     Allergies  Loratadine; Fluoxetine hcl; Ibuprofen; and Sumatriptan  Home Medications   Current Outpatient Rx  Name Route Sig Dispense Refill  . BACLOFEN 10 MG PO TABS Oral Take 10 mg by mouth 3 (three) times daily.    Marland Kitchen CETIRIZINE HCL 10 MG PO TABS  Oral Take 10 mg by mouth daily as needed. For seasonal allergies    . GABAPENTIN 300 MG PO CAPS Oral Take 300 mg by mouth at bedtime.    Marland Kitchen LEVALBUTEROL TARTRATE 45 MCG/ACT IN AERO Inhalation Inhale 1-2 puffs into the lungs every 4 (four) hours as needed. For shortness of breath.    . NABUMETONE 750 MG PO TABS Oral Take 750 mg by mouth 2 (two) times daily.    Marland Kitchen PAROXETINE HCL 40 MG PO TABS Oral Take 40 mg by mouth every evening.    Marland Kitchen RANITIDINE HCL 150 MG PO TABS Oral Take 150 mg by mouth 2 (two) times daily.    Marland Kitchen TAPENTADOL HCL 100 MG PO TABS Oral Take 1 tablet by mouth every 6 (six) hours as needed. For pain.      BP 136/96  Pulse 64  Temp 98.4 F (36.9 C) (Oral)  Resp 20  Ht 5' 7.25" (1.708 m)  Wt 240 lb (108.863 kg)  BMI 37.31 kg/m2  SpO2 96%  Physical Exam  Nursing note and vitals reviewed. Constitutional: She is oriented to person, place, and time. She appears well-developed and well-nourished. No distress.  HENT:  Head: Normocephalic and atraumatic.  Eyes: Conjunctivae normal are normal.  Neck: Neck supple.  Cardiovascular: Normal rate, regular rhythm and normal heart sounds.   Pulmonary/Chest: Effort normal and breath sounds normal.  No respiratory distress. She has no wheezes. She has no rales.  Abdominal: Soft. Bowel sounds are normal. She exhibits no distension. There is no tenderness. There is no rebound.  Musculoskeletal: Normal range of motion. She exhibits no edema.       Normal dirsal pedal pulses. Legs appear normal in color, no bruising, erythema, swelling  Neurological: She is alert and oriented to person, place, and time.       Pt able to dorsiflex bilateral feet and great toes. 5/5 strength with plantar and dorsiflexion of feet. 2+ reflexes bilaterally  Skin: Skin is warm and dry.  Psychiatric: She has a normal mood and affect.    ED Course  Procedures (including critical care time)  Pt with lumbar pain, after riding horses two days ago. Hx of lumbar fusion  2 years ago. Pt neurovascularly intact with normal sensation, normal strength and reflexes of LE bilaterally. No signs of cauda equana, no problems with bowls or urine. X-ray ordered. Pain meds ordred.   Results for orders placed during the hospital encounter of 05/14/11  COMPREHENSIVE METABOLIC PANEL      Component Value Range   Sodium 136  135 - 145 mEq/L   Potassium 4.1  3.5 - 5.1 mEq/L   Chloride 101  96 - 112 mEq/L   CO2 29  19 - 32 mEq/L   Glucose, Bld 99  70 - 99 mg/dL   BUN 6  6 - 23 mg/dL   Creatinine, Ser 4.78  0.50 - 1.10 mg/dL   Calcium 8.5  8.4 - 29.5 mg/dL   Total Protein 6.4  6.0 - 8.3 g/dL   Albumin 3.5  3.5 - 5.2 g/dL   AST 41 (*) 0 - 37 U/L   ALT 35  0 - 35 U/L   Alkaline Phosphatase 87  39 - 117 U/L   Total Bilirubin 0.2 (*) 0.3 - 1.2 mg/dL   GFR calc non Af Amer 89 (*) >90 mL/min   GFR calc Af Amer >90  >90 mL/min   Dg Lumbar Spine Complete  12/10/2011  *RADIOLOGY REPORT*  Clinical Data: 31 year old female with pain, numbness and tingling radiating down both lower extremities.  History of spinal fusion times 2 years.  LUMBAR SPINE - COMPLETE 4+ VIEW  Comparison: Lumbar spine CT 03/23/2011 and earlier.  Findings: Sequelae of L5-S1 posterior and interbody fusion plus decompression.  Hardware appears stable and intact.  Stable vertebral height and alignment.  Normal lumbar segmentation. Bone mineralization is within normal limits.  Sacral ala and SI joints within normal limits.  Right upper quadrant surgical clips.  IMPRESSION: Stable postoperative appearance of the lumbar spine since 06/09/2010.   Original Report Authenticated By: Harley Hallmark, M.D.     Pt is feeling better with treatment. She is in no distress she is ambulatory. Will d/c home with pain meds, muscle relaxant, follow up with Dr. Jordan Likes outpatient as needed.   1. Back pain       MDM          Lottie Mussel, PA 12/11/11 [phone]

## 2011-12-10 NOTE — ED Notes (Signed)
Received bedside report from [place], [place].  Patient currently sitting up in bed; no respiratory or acute distress noted.  Patient reports that her pain level has decreased, but that it is starting to increase again and that she would like to stay "on top of the pain".  PA notified that patient is requesting more pain medication.  Patient has no other questions or concerns at this time; will continue to monitor.

## 2011-12-13 NOTE — ED Provider Notes (Signed)
Medical screening examination/treatment/procedure(s) were performed by non-physician practitioner and as supervising physician I was immediately available for consultation/collaboration.   Shelda Jakes, MD 12/13/11 [phone]

## 2012-02-01 ENCOUNTER — Encounter (HOSPITAL_COMMUNITY): Payer: Self-pay | Admitting: *Deleted

## 2012-02-01 ENCOUNTER — Ambulatory Visit (HOSPITAL_COMMUNITY): Payer: Self-pay | Attending: Emergency Medicine

## 2012-02-01 ENCOUNTER — Emergency Department (HOSPITAL_COMMUNITY)
Admission: EM | Admit: 2012-02-01 | Discharge: 2012-02-01 | Disposition: A | Discharge status: Home / Self Care | Payer: Self-pay | Attending: Emergency Medicine | Admitting: Emergency Medicine

## 2012-02-01 DIAGNOSIS — J45909 Unspecified asthma, uncomplicated: Secondary | ICD-10-CM | POA: Insufficient documentation

## 2012-02-01 DIAGNOSIS — R350 Frequency of micturition: Secondary | ICD-10-CM | POA: Insufficient documentation

## 2012-02-01 DIAGNOSIS — Z79899 Other long term (current) drug therapy: Secondary | ICD-10-CM | POA: Insufficient documentation

## 2012-02-01 DIAGNOSIS — R109 Unspecified abdominal pain: Secondary | ICD-10-CM | POA: Insufficient documentation

## 2012-02-01 DIAGNOSIS — Z90710 Acquired absence of both cervix and uterus: Secondary | ICD-10-CM | POA: Insufficient documentation

## 2012-02-01 DIAGNOSIS — N949 Unspecified condition associated with female genital organs and menstrual cycle: Secondary | ICD-10-CM | POA: Insufficient documentation

## 2012-02-01 DIAGNOSIS — R1031 Right lower quadrant pain: Secondary | ICD-10-CM | POA: Insufficient documentation

## 2012-02-01 DIAGNOSIS — F17200 Nicotine dependence, unspecified, uncomplicated: Secondary | ICD-10-CM | POA: Insufficient documentation

## 2012-02-01 DIAGNOSIS — R161 Splenomegaly, not elsewhere classified: Secondary | ICD-10-CM | POA: Insufficient documentation

## 2012-02-01 DIAGNOSIS — R110 Nausea: Secondary | ICD-10-CM | POA: Insufficient documentation

## 2012-02-01 LAB — COMPREHENSIVE METABOLIC PANEL
ALT: 35 U/L (ref 0–35)
AST: 42 U/L — ABNORMAL HIGH (ref 0–37)
Albumin: 3.9 g/dL (ref 3.5–5.2)
Alkaline Phosphatase: 101 U/L (ref 39–117)
BUN: 7 mg/dL (ref 6–23)
CO2: 26 mEq/L (ref 19–32)
Calcium: 9.4 mg/dL (ref 8.4–10.5)
Chloride: 99 mEq/L (ref 96–112)
Creatinine, Ser: 0.69 mg/dL (ref 0.50–1.10)
GFR calc Af Amer: >90 mL/min (ref >90)
GFR calc non Af Amer: >90 mL/min (ref >90)
Glucose, Bld: 103 mg/dL — ABNORMAL HIGH (ref 70–99)
Potassium: 4.2 mEq/L (ref 3.5–5.1)
Sodium: 136 mEq/L (ref 135–145)
Total Bilirubin: 0.3 mg/dL (ref 0.3–1.2)
Total Protein: 7.7 g/dL (ref 6.0–8.3)

## 2012-02-01 LAB — CBC WITH DIFFERENTIAL/PLATELET
Basophils Absolute: 0.1 10*3/uL (ref 0.0–0.1)
Basophils Relative: 1 % (ref 0–1)
Eosinophils Absolute: 0.3 10*3/uL (ref 0.0–0.7)
Eosinophils Relative: 3 % (ref 0–5)
HCT: 41.5 % (ref 36.0–46.0)
Hemoglobin: 14.8 g/dL (ref 12.0–15.0)
Lymphocytes Relative: 23 % (ref 12–46)
Lymphs Abs: 2.1 10*3/uL (ref 0.7–4.0)
MCH: 31.9 pg (ref 26.0–34.0)
MCHC: 35.7 g/dL (ref 30.0–36.0)
MCV: 89.4 fL (ref 78.0–100.0)
Monocytes Absolute: 0.3 10*3/uL (ref 0.1–1.0)
Monocytes Relative: 4 % (ref 3–12)
Neutro Abs: 6.6 10*3/uL (ref 1.7–7.7)
Neutrophils Relative %: 71 % (ref 43–77)
Platelets: 264 10*3/uL (ref 150–400)
RBC: 4.64 MIL/uL (ref 3.87–5.11)
RDW: 12.1 % (ref 11.5–15.5)
WBC: 9.4 10*3/uL (ref 4.0–10.5)

## 2012-02-01 LAB — URINALYSIS, ROUTINE W REFLEX MICROSCOPIC
Bilirubin Urine: NEGATIVE
Glucose, UA: NEGATIVE mg/dL
Hgb urine dipstick: NEGATIVE
Ketones, ur: NEGATIVE mg/dL
Leukocytes, UA: NEGATIVE
Nitrite: NEGATIVE
Protein, ur: NEGATIVE mg/dL
Specific Gravity, Urine: 1.027 (ref 1.005–1.030)
Urobilinogen, UA: 0.2 mg/dL (ref 0.0–1.0)
pH: 6 (ref 5.0–8.0)

## 2012-02-01 LAB — LIPASE, BLOOD: Lipase: 42 U/L (ref 11–59)

## 2012-02-01 MED ORDER — PROMETHAZINE HCL 25 MG/ML IJ SOLN
12.5000 mg | Freq: Once | INTRAMUSCULAR | Status: AC
  Administered 2012-02-01: 12.5 mg via INTRAVENOUS
  Filled 2012-02-01: qty 1

## 2012-02-01 MED ORDER — SODIUM CHLORIDE 0.9 % IV SOLN
Freq: Once | INTRAVENOUS | Status: AC
  Administered 2012-02-01: 12:00:00 via INTRAVENOUS

## 2012-02-01 MED ORDER — IOHEXOL 300 MG/ML  SOLN
125.0000 mL | Freq: Once | INTRAMUSCULAR | Status: AC | PRN
  Administered 2012-02-01: 125 mL via INTRAVENOUS

## 2012-02-01 MED ORDER — ONDANSETRON HCL 4 MG PO TABS: 4.0000 mg | ORAL_TABLET | Freq: Four times a day (QID) | ORAL | Status: DC

## 2012-02-01 MED ORDER — HYDROMORPHONE HCL PF 1 MG/ML IJ SOLN
1.0000 mg | Freq: Once | INTRAMUSCULAR | Status: AC
  Administered 2012-02-01: 1 mg via INTRAVENOUS
  Filled 2012-02-01: qty 1

## 2012-02-01 MED ORDER — ONDANSETRON HCL 4 MG/2ML IJ SOLN
4.0000 mg | Freq: Once | INTRAMUSCULAR | Status: AC
  Administered 2012-02-01: 4 mg via INTRAVENOUS
  Filled 2012-02-01: qty 2

## 2012-02-01 MED ORDER — HYDROMORPHONE HCL PF 1 MG/ML IJ SOLN
0.5000 mg | Freq: Once | INTRAMUSCULAR | Status: AC
  Administered 2012-02-01: 0.5 mg via INTRAVENOUS
  Filled 2012-02-01: qty 1

## 2012-02-01 NOTE — ED Provider Notes (Signed)
History     CSN: [id number]  Arrival date & time 02/01/12  1018   First MD Initiated Contact with Patient 02/01/12 1102      Chief Complaint  Patient presents with  . Abdominal Pain    (Consider location/radiation/quality/duration/timing/severity/associated sxs/prior treatment) Patient is a 31 y.o. female presenting with abdominal pain. The history is provided by the patient.  Abdominal Pain The primary symptoms of the illness include abdominal pain and nausea. The primary symptoms of the illness do not include fever, vomiting, dysuria or vaginal discharge. The current episode started yesterday.  Additional symptoms associated with the illness include frequency. Symptoms associated with the illness do not include chills. Associated symptoms comments: She complains of right lower abdominal pain starting last night causing nausea with vomiting. She reports urinary frequency but no pain with urination. No fever. She has had a partial hysterectomy secondary to endometriosis and a history of ovarian cysts, which remind her of her current pain. .    Past Medical History  Diagnosis Date  . Asthma   . Bronchitis     Past Surgical History  Procedure Date  . Back surgery   . Cholecystectomy   . Tonsillectomy   . Abdominal hysterectomy     History reviewed. No pertinent family history.  History  Substance Use Topics  . Smoking status: Current Every Day Smoker  . Smokeless tobacco: Never Used  . Alcohol Use: Yes    OB History    Grav Para Term Preterm Abortions TAB SAB Ect Mult Living                  Review of Systems  Constitutional: Negative for fever and chills.  Respiratory: Negative.   Cardiovascular: Negative.   Gastrointestinal: Positive for nausea and abdominal pain. Negative for vomiting.  Genitourinary: Positive for frequency and pelvic pain. Negative for dysuria, flank pain and vaginal discharge.  Musculoskeletal: Negative.  Negative for myalgias.    Neurological: Negative.     Allergies  Loratadine; Ibuprofen; and Sumatriptan  Home Medications   Current Outpatient Rx  Name  Route  Sig  Dispense  Refill  . BACLOFEN 10 MG PO TABS   Oral   Take 10 mg by mouth 3 (three) times daily as needed. For back pain         . CETIRIZINE HCL 10 MG PO TABS   Oral   Take 10 mg by mouth daily as needed. For seasonal allergies         . LEVALBUTEROL TARTRATE 45 MCG/ACT IN AERO   Inhalation   Inhale 1-2 puffs into the lungs every 4 (four) hours as needed. For shortness of breath.         . NABUMETONE 750 MG PO TABS   Oral   Take 750 mg by mouth 2 (two) times daily as needed.          . OXYCODONE-ACETAMINOPHEN 5-325 MG PO TABS   Oral   Take 1 tablet by mouth every 4 (four) hours as needed for pain.   20 tablet   0   . PAROXETINE HCL 40 MG PO TABS   Oral   Take 40 mg by mouth every evening.         Marland Kitchen PROMETHAZINE HCL 25 MG PO TABS   Oral   Take 0.5 tablets (12.5 mg total) by mouth every 6 (six) hours as needed for nausea.   30 tablet   0   . RANITIDINE HCL 150 MG PO  TABS   Oral   Take 150 mg by mouth 2 (two) times daily.         Marland Kitchen TAPENTADOL HCL ER 100 MG PO TB12   Oral   Take 100 mg by mouth 2 (two) times daily.           BP 132/91  Pulse 87  Temp 99.3 F (37.4 C) (Oral)  Resp 18  SpO2 100%  Physical Exam  Constitutional: She is oriented to person, place, and time. She appears well-developed and well-nourished.  HENT:  Head: Normocephalic.  Neck: Normal range of motion. Neck supple.  Cardiovascular: Normal rate and regular rhythm.   Pulmonary/Chest: Effort normal and breath sounds normal.  Abdominal: Soft. There is no rebound and no guarding.       RLQ tenderness without rebounding. No mass.   Genitourinary:       Pelvic exam deferred s/p hysterectomy.  Musculoskeletal: Normal range of motion.  Neurological: She is alert and oriented to person, place, and time.  Skin: Skin is warm and dry. No  rash noted.  Psychiatric: She has a normal mood and affect.    ED Course  Procedures (including critical care time) 12:30 - re-evaluation - she is still tender to RLQ abdomen, still nauseous. Phenergan ordered. Records reviewed from hysterectomy and there is no reference made to procedure including appendix. CT scan necessary to rule out appy.   Labs Reviewed  URINALYSIS, ROUTINE W REFLEX MICROSCOPIC  CBC WITH DIFFERENTIAL  COMPREHENSIVE METABOLIC PANEL  LIPASE, BLOOD   Results for orders placed during the hospital encounter of 02/01/12  URINALYSIS, ROUTINE W REFLEX MICROSCOPIC      Component Value Range   Color, Urine YELLOW  YELLOW   APPearance CLEAR  CLEAR   Specific Gravity, Urine 1.027  1.005 - 1.030   pH 6.0  5.0 - 8.0   Glucose, UA NEGATIVE  NEGATIVE mg/dL   Hgb urine dipstick NEGATIVE  NEGATIVE   Bilirubin Urine NEGATIVE  NEGATIVE   Ketones, ur NEGATIVE  NEGATIVE mg/dL   Protein, ur NEGATIVE  NEGATIVE mg/dL   Urobilinogen, UA 0.2  0.0 - 1.0 mg/dL   Nitrite NEGATIVE  NEGATIVE   Leukocytes, UA NEGATIVE  NEGATIVE  CBC WITH DIFFERENTIAL      Component Value Range   WBC 9.4  4.0 - 10.5 K/uL   RBC 4.64  3.87 - 5.11 MIL/uL   Hemoglobin 14.8  12.0 - 15.0 g/dL   HCT 47.8  29.5 - 62.1 %   MCV 89.4  78.0 - 100.0 fL   MCH 31.9  26.0 - 34.0 pg   MCHC 35.7  30.0 - 36.0 g/dL   RDW 30.8  65.7 - 84.6 %   Platelets 264  150 - 400 K/uL   Neutrophils Relative 71  43 - 77 %   Neutro Abs 6.6  1.7 - 7.7 K/uL   Lymphocytes Relative 23  12 - 46 %   Lymphs Abs 2.1  0.7 - 4.0 K/uL   Monocytes Relative 4  3 - 12 %   Monocytes Absolute 0.3  0.1 - 1.0 K/uL   Eosinophils Relative 3  0 - 5 %   Eosinophils Absolute 0.3  0.0 - 0.7 K/uL   Basophils Relative 1  0 - 1 %   Basophils Absolute 0.1  0.0 - 0.1 K/uL  COMPREHENSIVE METABOLIC PANEL      Component Value Range   Sodium 136  135 - 145 mEq/L   Potassium 4.2  3.5 - 5.1 mEq/L   Chloride 99  96 - 112 mEq/L   CO2 26  19 - 32 mEq/L    Glucose, Bld 103 (*) 70 - 99 mg/dL   BUN 7  6 - 23 mg/dL   Creatinine, Ser 1.61  0.50 - 1.10 mg/dL   Calcium 9.4  8.4 - 09.6 mg/dL   Total Protein 7.7  6.0 - 8.3 g/dL   Albumin 3.9  3.5 - 5.2 g/dL   AST 42 (*) 0 - 37 U/L   ALT 35  0 - 35 U/L   Alkaline Phosphatase 101  39 - 117 U/L   Total Bilirubin 0.3  0.3 - 1.2 mg/dL   GFR calc non Af Amer >90  >90 mL/min   GFR calc Af Amer >90  >90 mL/min  LIPASE, BLOOD      Component Value Range   Lipase 42  11 - 59 U/L  Ct Abdomen Pelvis W Contrast  02/01/2012  *RADIOLOGY REPORT*  Clinical Data: Right lower quadrant abdominal pain and nausea.  Low grade fever.  CT ABDOMEN AND PELVIS WITH CONTRAST  Technique:  Multidetector CT imaging of the abdomen and pelvis was performed following the standard protocol during bolus administration of intravenous contrast.  Contrast: OMNIPAQUE IOHEXOL 300 MG/ML  SOLN  Comparison: CT of the abdomen and pelvis 11/11/2009.  Findings:  Lung Bases: Unremarkable.  Abdomen/Pelvis:  Status post cholecystectomy.  The appearance of the liver, pancreas, bilateral adrenal glands and bilateral kidneys is unremarkable.  Splenomegaly (16 cm AP).  No ascites or pneumoperitoneum and no pathologic distension of small bowel.  No definite pathologic lymphadenopathy identified within the abdomen or pelvis.  Uterus has been removed.  Ovaries are unremarkable in appearance bilaterally.  Normal appendix.  Urinary bladder is normal in appearance.  Musculoskeletal: There are no aggressive appearing lytic or blastic lesions noted in the visualized portions of the skeleton.  Status post PLIF and L5-S1 with an interbody graft at the L5-S1 interspace.  IMPRESSION: 1.  No acute findings in the abdomen or pelvis to account the patient's symptoms. 2.  Splenomegaly. 3.  Normal appendix. 4.  Status post hysterectomy, cholecystectomy and PLIF at L5-S1.   Original Report Authenticated By: Trudie Reed, M.D.     No results found.   No diagnosis  found. 1. Abdominal pain   MDM  Pain has been controlled with medications. CT abd/pel negative, commenting normal appendix. Feel patient is stable for discharge.        Rodena Medin, PA-C 02/01/12 [phone]

## 2012-02-01 NOTE — ED Notes (Signed)
Pt states yesterday started having R sided groin/lower abdominal pain, was nauseous and having reflux, pt states today pain has worsened and been nauseous, denies vomiting or diarrhea, pt states having freq w/ urination, denies burning. Pt states has a hx of cyst on her ovaries and this feels like the same symptoms she usually has.

## 2012-02-01 NOTE — ED Notes (Signed)
Patient transported to CT 

## 2012-02-01 NOTE — ED Notes (Signed)
Xray staff in to give pt oral contrast, will monitor.

## 2012-02-02 NOTE — ED Provider Notes (Signed)
Medical screening examination/treatment/procedure(s) were performed by non-physician practitioner and as supervising physician I was immediately available for consultation/collaboration.  Raeford Razor, MD 02/02/12 [phone]

## 2012-07-22 ENCOUNTER — Emergency Department (HOSPITAL_COMMUNITY)
Admission: EM | Admit: 2012-07-22 | Discharge: 2012-07-22 | Disposition: A | Discharge status: Home / Self Care | Payer: Self-pay | Attending: Emergency Medicine | Admitting: Emergency Medicine

## 2012-07-22 ENCOUNTER — Encounter (HOSPITAL_COMMUNITY): Payer: Self-pay | Admitting: Emergency Medicine

## 2012-07-22 DIAGNOSIS — Z9889 Other specified postprocedural states: Secondary | ICD-10-CM | POA: Insufficient documentation

## 2012-07-22 DIAGNOSIS — J45909 Unspecified asthma, uncomplicated: Secondary | ICD-10-CM | POA: Insufficient documentation

## 2012-07-22 DIAGNOSIS — IMO0002 Reserved for concepts with insufficient information to code with codable children: Secondary | ICD-10-CM | POA: Insufficient documentation

## 2012-07-22 DIAGNOSIS — F17200 Nicotine dependence, unspecified, uncomplicated: Secondary | ICD-10-CM | POA: Insufficient documentation

## 2012-07-22 DIAGNOSIS — M5416 Radiculopathy, lumbar region: Secondary | ICD-10-CM

## 2012-07-22 DIAGNOSIS — Z79899 Other long term (current) drug therapy: Secondary | ICD-10-CM | POA: Insufficient documentation

## 2012-07-22 DIAGNOSIS — Z8709 Personal history of other diseases of the respiratory system: Secondary | ICD-10-CM | POA: Insufficient documentation

## 2012-07-22 MED ORDER — PREDNISONE 10 MG PO TABS: ORAL_TABLET | ORAL | Status: DC

## 2012-07-22 MED ORDER — HYDROCODONE-ACETAMINOPHEN 5-325 MG PO TABS: 1.0000 | ORAL_TABLET | Freq: Four times a day (QID) | ORAL | Status: DC | PRN

## 2012-07-22 MED ORDER — KETOROLAC TROMETHAMINE 60 MG/2ML IM SOLN
60.0000 mg | Freq: Once | INTRAMUSCULAR | Status: AC
  Administered 2012-07-22: 60 mg via INTRAMUSCULAR
  Filled 2012-07-22: qty 2

## 2012-07-22 MED ORDER — CYCLOBENZAPRINE HCL 10 MG PO TABS: 10.0000 mg | ORAL_TABLET | Freq: Two times a day (BID) | ORAL | Status: DC | PRN

## 2012-07-22 MED ORDER — PREDNISONE 20 MG PO TABS
60.0000 mg | ORAL_TABLET | Freq: Once | ORAL | Status: AC
  Administered 2012-07-22: 60 mg via ORAL
  Filled 2012-07-22: qty 3

## 2012-07-22 NOTE — ED Notes (Signed)
Pt states that 5 days ago her mid back started hurting in what she describes as the thoracic region. States that she has a fusion at L5-S1. Denies trauma.

## 2012-07-22 NOTE — ED Provider Notes (Signed)
History    This chart was scribed for non-physician practitioner Jaynie Crumble, PA-C working with Raeford Razor, MD by Gerlean Ren, ED Scribe. This patient was seen in room WTR8/WTR8 and the patient's care was started at 4:28 PM.    CSN: [id number]  Arrival date & time 07/22/12  1547   First MD Initiated Contact with Patient 07/22/12 1606      Chief Complaint  Patient presents with  . Back Pain    The history is provided by the patient. No language interpreter was used.  Angie Brown is a 32 y.o. female who presents to the Emergency Department complaining of constant mid back pain first noticed 5 days ago.  Pt reports receiving minor push 5 days ago that may or may not be related and denies any true traumas, injuries, or unusual activities as cause.  Pt reports chronic lower back pain radiating down bilateral posterior lower extremities for which she has had L5-S1 fusion by Dr. Dutch Quint, this pain is unchanged.  Pt denies numbness, weakness, and incontinence of bowel or bladder but does reports small amounts of occasional urine leakage.  Pt has not followed up with Dr. Dutch Quint due to insurance issues.       Past Medical History  Diagnosis Date  . Asthma   . Bronchitis     Past Surgical History  Procedure Laterality Date  . Back surgery    . Cholecystectomy    . Tonsillectomy    . Abdominal hysterectomy      History reviewed. No pertinent family history.  History  Substance Use Topics  . Smoking status: Current Every Day Smoker  . Smokeless tobacco: Never Used  . Alcohol Use: Yes    No OB history provided.   Review of Systems  Constitutional: Negative for fever and chills.  Respiratory: Negative for shortness of breath.   Gastrointestinal: Negative for nausea and vomiting.  Musculoskeletal: Positive for back pain.  Neurological: Negative for weakness and numbness.  All other systems reviewed and are negative.    Allergies  Loratadine; Ibuprofen; and  Sumatriptan  Home Medications   Current Outpatient Rx  Name  Route  Sig  Dispense  Refill  . cyclobenzaprine (FLEXERIL) 10 MG tablet   Oral   Take 10 mg by mouth every evening.         Marland Kitchen HYDROcodone-acetaminophen (VICODIN) 5-500 MG per tablet   Oral   Take 1 tablet by mouth once. Pt states she just took one tablet last night from an old prescription, doesn't take on a regular basis           BP 107/67  Pulse 102  Temp(Src) 98.5 F (36.9 C) (Oral)  SpO2 100%  Physical Exam  Nursing note and vitals reviewed. Constitutional: She is oriented to person, place, and time. She appears well-developed and well-nourished. No distress.  HENT:  Head: Normocephalic and atraumatic.  Eyes: EOM are normal.  Neck: Neck supple. No tracheal deviation present.  Cardiovascular: Normal rate.   Pulmonary/Chest: Effort normal. No respiratory distress.  Musculoskeletal: Normal range of motion.  Tender over lower thoracic midline and lumbar midline spine.  Tenderness to left SI joint.   Neurological: She is alert and oriented to person, place, and time.  Pain with left straight leg raise, 5/5 strength in left foot, able to dorsiflex left foot and great toe normally.   Skin: Skin is warm and dry.  Psychiatric: She has a normal mood and affect. Her behavior is normal.  ED Course  Procedures (including critical care time) DIAGNOSTIC STUDIES: Oxygen Saturation is 100% on room air, normal by my interpretation.    COORDINATION OF CARE: 4:34 PM- Informed pt that XRs do not appear necessary at this time because there was no new injury.  Discussed pain relief here and prescription to take home.  Informed pt that follow-up with Dr. Dutch Quint appears necessary.  Pt verbalizes understanding and agrees.     1. Lumbar radiculopathy       MDM  Pt with exacerbation of her chronic lower back pain. No major trauma. No new neuro deficits. No red flags to suggest cauda equina. Pt has tried over the coutner  medications with no relief. She is unable to see dr. Jordan Likes due to losing her insurance. Will treat with prednisone taper, vicodin, flexeril, follow up as soon as able. Precautions given to return if worsening.      I personally performed the services described in this documentation, which was scribed in my presence. The recorded information has been reviewed and is accurate.   Lottie Mussel, PA-C 07/22/12 1707

## 2012-07-24 NOTE — ED Provider Notes (Signed)
Medical screening examination/treatment/procedure(s) were performed by non-physician practitioner and as supervising physician I was immediately available for consultation/collaboration.  Lashya Passe, MD 07/24/12 0054 

## 2013-04-16 ENCOUNTER — Emergency Department (HOSPITAL_COMMUNITY)
Admission: EM | Admit: 2013-04-16 | Discharge: 2013-04-17 | Disposition: A | Discharge status: Home / Self Care | Payer: Self-pay | Attending: Emergency Medicine | Admitting: Emergency Medicine

## 2013-04-16 ENCOUNTER — Encounter (HOSPITAL_COMMUNITY): Payer: Self-pay | Admitting: Emergency Medicine

## 2013-04-16 DIAGNOSIS — Z862 Personal history of diseases of the blood and blood-forming organs and certain disorders involving the immune mechanism: Secondary | ICD-10-CM | POA: Insufficient documentation

## 2013-04-16 DIAGNOSIS — R51 Headache: Secondary | ICD-10-CM | POA: Insufficient documentation

## 2013-04-16 DIAGNOSIS — Z87891 Personal history of nicotine dependence: Secondary | ICD-10-CM | POA: Insufficient documentation

## 2013-04-16 DIAGNOSIS — Z3202 Encounter for pregnancy test, result negative: Secondary | ICD-10-CM | POA: Insufficient documentation

## 2013-04-16 DIAGNOSIS — R5381 Other malaise: Secondary | ICD-10-CM | POA: Insufficient documentation

## 2013-04-16 DIAGNOSIS — J45909 Unspecified asthma, uncomplicated: Secondary | ICD-10-CM | POA: Insufficient documentation

## 2013-04-16 DIAGNOSIS — Z8639 Personal history of other endocrine, nutritional and metabolic disease: Secondary | ICD-10-CM | POA: Insufficient documentation

## 2013-04-16 DIAGNOSIS — R5383 Other fatigue: Secondary | ICD-10-CM | POA: Insufficient documentation

## 2013-04-16 DIAGNOSIS — R531 Weakness: Secondary | ICD-10-CM

## 2013-04-16 DIAGNOSIS — R42 Dizziness and giddiness: Secondary | ICD-10-CM | POA: Insufficient documentation

## 2013-04-16 HISTORY — DX: Hypoglycemia, unspecified: E16.2

## 2013-04-16 LAB — CBC WITH DIFFERENTIAL/PLATELET
BASOS ABS: 0.1 10*3/uL (ref 0.0–0.1)
BASOS PCT: 0 % (ref 0–1)
Eosinophils Absolute: 0.3 10*3/uL (ref 0.0–0.7)
Eosinophils Relative: 2 % (ref 0–5)
HEMATOCRIT: 42.1 % (ref 36.0–46.0)
Hemoglobin: 14.9 g/dL (ref 12.0–15.0)
LYMPHS PCT: 32 % (ref 12–46)
Lymphs Abs: 3.8 10*3/uL (ref 0.7–4.0)
MCH: 32.1 pg (ref 26.0–34.0)
MCHC: 35.4 g/dL (ref 30.0–36.0)
MCV: 90.7 fL (ref 78.0–100.0)
Monocytes Absolute: 0.6 10*3/uL (ref 0.1–1.0)
Monocytes Relative: 5 % (ref 3–12)
NEUTROS ABS: 7 10*3/uL (ref 1.7–7.7)
NEUTROS PCT: 60 % (ref 43–77)
PLATELETS: 320 10*3/uL (ref 150–400)
RBC: 4.64 MIL/uL (ref 3.87–5.11)
RDW: 12.4 % (ref 11.5–15.5)
WBC: 11.6 10*3/uL — AB (ref 4.0–10.5)

## 2013-04-16 LAB — URINALYSIS, ROUTINE W REFLEX MICROSCOPIC
BILIRUBIN URINE: NEGATIVE
Glucose, UA: NEGATIVE mg/dL
Hgb urine dipstick: NEGATIVE
Ketones, ur: NEGATIVE mg/dL
LEUKOCYTES UA: NEGATIVE
NITRITE: NEGATIVE
PH: 6 (ref 5.0–8.0)
Protein, ur: NEGATIVE mg/dL
SPECIFIC GRAVITY, URINE: 1.017 (ref 1.005–1.030)
UROBILINOGEN UA: 0.2 mg/dL (ref 0.0–1.0)

## 2013-04-16 LAB — POCT I-STAT, CHEM 8
BUN: 3 mg/dL — ABNORMAL LOW (ref 6–23)
Calcium, Ion: 1.13 mmol/L (ref 1.12–1.23)
Chloride: 101 mEq/L (ref 96–112)
Creatinine, Ser: 0.8 mg/dL (ref 0.50–1.10)
Glucose, Bld: 95 mg/dL (ref 70–99)
HCT: 45 % (ref 36.0–46.0)
Hemoglobin: 15.3 g/dL — ABNORMAL HIGH (ref 12.0–15.0)
Potassium: 5.1 mEq/L (ref 3.7–5.3)
Sodium: 139 mEq/L (ref 137–147)
TCO2: 30 mmol/L (ref 0–100)

## 2013-04-16 LAB — GLUCOSE, CAPILLARY
GLUCOSE-CAPILLARY: 86 mg/dL (ref 70–99)
Glucose-Capillary: 81 mg/dL (ref 70–99)

## 2013-04-16 LAB — POCT PREGNANCY, URINE: Preg Test, Ur: NEGATIVE

## 2013-04-16 MED ORDER — SODIUM CHLORIDE 0.9 % IV BOLUS (SEPSIS)
1000.0000 mL | Freq: Once | INTRAVENOUS | Status: AC
  Administered 2013-04-16: 1000 mL via INTRAVENOUS

## 2013-04-16 MED ORDER — SODIUM CHLORIDE 0.9 % IV SOLN: INTRAVENOUS | Status: DC

## 2013-04-16 MED ORDER — ONDANSETRON HCL 4 MG/2ML IJ SOLN
4.0000 mg | Freq: Once | INTRAMUSCULAR | Status: AC
  Administered 2013-04-16: 4 mg via INTRAVENOUS
  Filled 2013-04-16: qty 2

## 2013-04-16 NOTE — ED Notes (Signed)
Pt c/o generalized weakness, BG inconsistent since Sunday night, as low as 68. Pt states she feels "drunk". Pt states she does have seizure history but has not had seizure this week. +nausea, denies v/d, denies pain.

## 2013-04-16 NOTE — ED Provider Notes (Signed)
CSN: [id number]     Arrival date & time 04/16/13  [id number] History   First MD Initiated Contact with Patient 04/16/13 2223     Chief Complaint  Patient presents with  . Fatigue   (Consider location/radiation/quality/duration/timing/severity/associated sxs/prior Treatment) HPI  33 year old female with history of asthma, hypoglycemia presents for evaluation of generalized weakness.  Patient states she has a significant family history of diabetes. She has a glucometer at home which she checks her blood sugar on occasion has noticed that has been fluctuating back and forth. For the past 4 days patient has been abusing generalized weakness, having occasional headache, feeling tired and dehydrated, and feels dry. States she feels as if she was drunk. Also notice the blood sugar as low as 68 several days ago. She is a single mother and states she does not eat and sleep on a normal schedule but this has not changed. Denies having any redness sneezing coughing, vomiting or diarrhea, chest pain shortness of breath, abdominal pain or back pain, dysuria or vaginal discharge. Patient has never been diagnosed with diabetes before. Denies any recent medication changes. No specific treatment tried  Past Medical History  Diagnosis Date  . Asthma   . Bronchitis   . Hypoglycemia    Past Surgical History  Procedure Laterality Date  . Back surgery    . Cholecystectomy    . Tonsillectomy    . Abdominal hysterectomy     No family history on file. History  Substance Use Topics  . Smoking status: Former Smoker    Quit date: 09/03/2012  . Smokeless tobacco: Never Used  . Alcohol Use: Yes     Comment: social   OB History   Grav Para Term Preterm Abortions TAB SAB Ect Mult Living                 Review of Systems  All other systems reviewed and are negative.    Allergies  Loratadine; Ibuprofen; and Sumatriptan  Home Medications   Current Outpatient Rx  Name  Route  Sig  Dispense  Refill  .  cyclobenzaprine (FLEXERIL) 10 MG tablet   Oral   Take 1 tablet (10 mg total) by mouth 2 (two) times daily as needed for muscle spasms.   20 tablet   0   . HYDROcodone-acetaminophen (NORCO) 5-325 MG per tablet   Oral   Take 1 tablet by mouth every 6 (six) hours as needed for pain.   20 tablet   0    BP 135/93  Pulse 73  Temp(Src) 99.1 F (37.3 C) (Oral)  Resp 15  Ht 5\' 7"  (1.702 m)  Wt 220 lb (99.791 kg)  BMI 34.45 kg/m2  SpO2 100% Physical Exam  Nursing note and vitals reviewed. Constitutional: She is oriented to person, place, and time. She appears well-developed and well-nourished. No distress.  HENT:  Head: Atraumatic.  Right Ear: External ear normal.  Left Ear: External ear normal.  Nose: Nose normal.  Mouth/Throat: Oropharynx is clear and moist.  Eyes: Conjunctivae and EOM are normal. Pupils are equal, round, and reactive to light.  Although extraocular movements intact, patient complains of dizziness with eye movement  Neck: Normal range of motion. Neck supple.  No nuchal rigidity  Cardiovascular: Normal rate and regular rhythm.   Pulmonary/Chest: Effort normal and breath sounds normal.  Abdominal: Soft. There is no tenderness.  Musculoskeletal: She exhibits no edema and no tenderness.  Neurological: She is alert and oriented to person, place, and time.  Cranial 3 through 12 is grossly intact. 5/5 strength to all 4 extremities Normal gait, normal finger to nose, heels to shin  Skin: No rash noted.  Psychiatric: She has a normal mood and affect.    ED Course  Procedures (including critical care time)  11:03 PM The patient in with generalized weakness. She has no focal neuro deficit. She has normal CBG. She is afebrile with stable normal vital signs and nontoxic in appearance.  12:02 AM Patient reports she felt better after receiving IV fluid. She is able to tolerate by mouth at this time. She has normal hemoglobin, no evidence of anemia, her electrolytes are  reassuring, her labs are otherwise normal. UA showed no evidence of urinary tract infection, negative pregnancy test, and repeat CBG is normal. At this time as the patient stable for discharge. We'll provide resource outpatient followup. Return precautions discussed.  Labs Review Labs Reviewed  CBC WITH DIFFERENTIAL - Abnormal; Notable for the following:    WBC 11.6 (*)    All other components within normal limits  POCT I-STAT, CHEM 8 - Abnormal; Notable for the following:    BUN 3 (*)    Hemoglobin 15.3 (*)    All other components within normal limits  GLUCOSE, CAPILLARY  GLUCOSE, CAPILLARY  URINALYSIS, ROUTINE W REFLEX MICROSCOPIC  POCT PREGNANCY, URINE   Imaging Review No results found.  EKG Interpretation   None       MDM   1. Generalized weakness    BP 140/81  Pulse 69  Temp(Src) 99.5 F (37.5 C) (Oral)  Resp 19  Ht 5\' 7"  (1.702 m)  Wt 220 lb (99.791 kg)  BMI 34.45 kg/m2  SpO2 100%  I have reviewed nursing notes and vital signs.  I reviewed available ER/hospitalization records thought the EMR     Fayrene Helper, [place] 04/17/13 0012

## 2013-04-17 NOTE — ED Provider Notes (Signed)
Medical screening examination/treatment/procedure(s) were performed by non-physician practitioner and as supervising physician I was immediately available for consultation/collaboration.  EKG Interpretation   None        Sabastian Raimondi, MD 04/17/13 0158 

## 2013-04-17 NOTE — Discharge Instructions (Signed)
Fatigue °Fatigue is a feeling of tiredness, lack of energy, lack of motivation, or feeling tired all the time. Having enough rest, good nutrition, and reducing stress will normally reduce fatigue. Consult your caregiver if it persists. The nature of your fatigue will help your caregiver to find out its cause. The treatment is based on the cause.  °CAUSES  °There are many causes for fatigue. Most of the time, fatigue can be traced to one or more of your habits or routines. Most causes fit into one or more of three general areas. They are: °Lifestyle problems °· Sleep disturbances. °· Overwork. °· Physical exertion. °· Unhealthy habits. °· Poor eating habits or eating disorders. °· Alcohol and/or drug use . °· Lack of proper nutrition (malnutrition). °Psychological problems °· Stress and/or anxiety problems. °· Depression. °· Grief. °· Boredom. °Medical Problems or Conditions °· Anemia. °· Pregnancy. °· Thyroid gland problems. °· Recovery from major surgery. °· Continuous pain. °· Emphysema or asthma that is not well controlled °· Allergic conditions. °· Diabetes. °· Infections (such as mononucleosis). °· Obesity. °· Sleep disorders, such as sleep apnea. °· Heart failure or other heart-related problems. °· Cancer. °· Kidney disease. °· Liver disease. °· Effects of certain medicines such as antihistamines, cough and cold remedies, prescription pain medicines, heart and blood pressure medicines, drugs used for treatment of cancer, and some antidepressants. °SYMPTOMS  °The symptoms of fatigue include:  °· Lack of energy. °· Lack of drive (motivation). °· Drowsiness. °· Feeling of indifference to the surroundings. °DIAGNOSIS  °The details of how you feel help guide your caregiver in finding out what is causing the fatigue. You will be asked about your present and past health condition. It is important to review all medicines that you take, including prescription and non-prescription items. A thorough exam will be done.  You will be questioned about your feelings, habits, and normal lifestyle. Your caregiver may suggest blood tests, urine tests, or other tests to look for common medical causes of fatigue.  °TREATMENT  °Fatigue is treated by correcting the underlying cause. For example, if you have continuous pain or depression, treating these causes will improve how you feel. Similarly, adjusting the dose of certain medicines will help in reducing fatigue.  °HOME CARE INSTRUCTIONS  °· Try to get the required amount of good sleep every night. °· Eat a healthy and nutritious diet, and drink enough water throughout the day. °· Practice ways of relaxing (including yoga or meditation). °· Exercise regularly. °· Make plans to change situations that cause stress. Act on those plans so that stresses decrease over time. Keep your work and personal routine reasonable. °· Avoid street drugs and minimize use of alcohol. °· Start taking a daily multivitamin after consulting your caregiver. °SEEK MEDICAL CARE IF:  °· You have persistent tiredness, which cannot be accounted for. °· You have fever. °· You have unintentional weight loss. °· You have headaches. °· You have disturbed sleep throughout the night. °· You are feeling sad. °· You have constipation. °· You have dry skin. °· You have gained weight. °· You are taking any new or different medicines that you suspect are causing fatigue. °· You are unable to sleep at night. °· You develop any unusual swelling of your legs or other parts of your body. °SEEK IMMEDIATE MEDICAL CARE IF:  °· You are feeling confused. °· Your vision is blurred. °· You feel faint or pass out. °· You develop severe headache. °· You develop severe abdominal, pelvic, or   back pain. °· You develop chest pain, shortness of breath, or an irregular or fast heartbeat. °· You are unable to pass a normal amount of urine. °· You develop abnormal bleeding such as bleeding from the rectum or you vomit blood. °· You have thoughts  about harming yourself or committing suicide. °· You are worried that you might harm someone else. °MAKE SURE YOU:  °· Understand these instructions. °· Will watch your condition. °· Will get help right away if you are not doing well or get worse. °Document Released: 01/01/2007 Document Revised: 05/29/2011 Document Reviewed: 01/01/2007 °ExitCare® Patient Information ©2014 [employer]. ° ° ° °Emergency Department Resource Guide °1) Find a Doctor and Pay Out of Pocket °Although you won't have to find out who is covered by your insurance plan, it is a good idea to ask around and get recommendations. You will then need to call the office and see if the doctor you have chosen will accept you as a new patient and what types of options they offer for patients who are self-pay. Some doctors offer discounts or will set up payment plans for their patients who do not have insurance, but you will need to ask so you aren't surprised when you get to your appointment. ° °2) Contact Your Local Health Department °Not all health departments have doctors that can see patients for sick visits, but many do, so it is worth a call to see if yours does. If you don't know where your local health department is, you can check in your phone book. The CDC also has a tool to help you locate your state's health department, and many state websites also have listings of all of their local health departments. ° °3) Find a Walk-in Clinic °If your illness is not likely to be very severe or complicated, you may want to try a walk in clinic. These are popping up all over the country in pharmacies, drugstores, and shopping centers. They're usually staffed by nurse practitioners or physician assistants that have been trained to treat common illnesses and complaints. They're usually fairly quick and inexpensive. However, if you have serious medical issues or chronic medical problems, these are probably not your best option. ° °No Primary Care  Doctor: °- Call [facility] at  832-8000 - they can help you locate a primary care doctor that  accepts your insurance, provides certain services, etc. °- Physician Referral Service- 1-800-533-3463 ° °Chronic Pain Problems: °Organization         Address  Phone   Notes  °[facility]  (336) 297-2271 Patients need to be referred by their primary care doctor.  ° °Medication Assistance: °Organization         Address  Phone   Notes  °[place] [facility] [address] °[place], [place] [address] (336) 641-8030 --Must be a resident of [place] °-- Must have NO insurance coverage whatsoever (no Medicaid/ Medicare, etc.) °-- The pt. MUST have a primary care doctor that directs their care regularly and follows them in the community °  °[facility]  (866) 331-1348   °[employer]  (888) 892-1162   ° °Agencies that provide inexpensive medical care: °Organization         Address  Phone   Notes  °[facility]  (336) 832-8035   °[facility]    (336) 832-7272   °[facility] [address] °[place], [place] [address] (336) 832-4777   °  [facility] [address], °[place] (336) 271-4999   °[facility]    (336) 373-0678   °[facility]    (336) 272-1050   °[facility] ° [address], [place] Phone:  (336) 832-4444, Fax:  (336) 832-4440 Hours of Operation:  9 am - 6 pm, M-F.  Also accepts Medicaid/Medicare and self-pay.  °[facility] ° [address], [place] Phone: (336) 832-3150, Fax: (336) 832-3151. Hours of Operation:  8:30 am - 5:30 pm, M-F.  Also accepts Medicaid and self-pay.  °[facility] [place] [address], [place] Phone: (336) 878-6027   °[facility] [address], [place], [place] (336)723-1848, Ext. 123 Mondays & Thursdays: 7-9 AM.  First 15 patients are seen on a first  come, first serve basis. °  ° °Medicaid-accepting [place] Providers: ° °Organization         Address  Phone   Notes  °[facility] [address], [place] (336) 641-2100 Also accepts self-pay patients.  °[facility] [address], [place] ° (336) 856-9996   °[facility] [address], [place] (336) 288-8857   °[facility] [address] [place] [address], [place] (336) 299-7000   °Veita Bland [address], [place]  ° (336) 373-1557 Only accepts [place] Access Medicaid patients after they have their name applied to their card.  ° °Self-Pay (no insurance) in [place]: ° °Organization         Address  Phone   Notes  °Sickle Cell Patients, [facility] [address], [place] (336) 832-1970   °[facility] [address], [place] (336) 832-4400   °[facility] ° [address], [place] (336) 992-4800   °[facility]/Dr. Osei-Bonsu ° [address] [place] [address], [place] or [address], [place] (336) 841-8500 Phone number for both [place] and [place] locations is the same.  °[facility] [address], [place] (336) 299-0000   °[facility] [address] [place] [address], [place] or [address] (336) 852-7530 °(336) 878-2260   °[facility] [address], [place] (336) 350-1642, phone; (336) 294-5005, fax Sees patients 1st and 3rd Saturday of every month.  Must not qualify for public or private insurance (i.e. Medicaid, Medicare, [place] Health Choice, Veterans' Benefits) • Household income should be no more than 200% of the poverty level •The clinic cannot treat you if you are pregnant or think you are pregnant • Sexually transmitted diseases are not treated at the clinic.  ° ° °Dental Care: °Organization          Address  Phone  Notes  °[facility] [address], [place] (336) 641-6152 Accepts children up to age 21 who are enrolled in Medicaid or [place] Health Choice; pregnant women with a Medicaid card; and children who have applied for Medicaid or [place] Health Choice, but were declined, whose parents can pay a reduced fee at time of service.  °[facility] [place]  [address], [place] (336) 641-7733 Accepts children up to age 21 who are enrolled in Medicaid or  Health Choice; pregnant women with a Medicaid card; and   children who have applied for Medicaid or [place] Health Choice, but were declined, whose parents can pay a reduced fee at time of service.  °[facility] ° [address], [place] (336) 641-4533 Patients are seen by appointment only. Walk-ins are not accepted. [facility] will see patients 18 years of age and older. °Monday - Tuesday (8am-5pm) °Most Wednesdays (8:30-5pm) °$30 per visit, cash only  °[facility] ° [address], [place] (336) 641-4533 Patients are seen by appointment only. Walk-ins are not accepted. [facility] will see patients 18 years of age and older. °One Wednesday Evening (Monthly: Volunteer Based).  $30 per visit, cash only  °[facility]  (919) 537-3737 for adults; Children under age 4, call Graduate Pediatric Dentistry at ([phone]) [phone]. Children aged 4-14, please call (919) [phone] to request a pediatric application. ° Dental services are provided in all areas of dental care including fillings, crowns and bridges, complete and partial dentures, implants, gum treatment, root canals, and extractions. Preventive care is also provided. Treatment is provided to both adults and children. °Patients are selected via a lottery and there is often a waiting list. °  °[facility] [address], °[name] ° (336) 763-8833 [email or web address] °  °[facility] [address], [place], [place] (336)723-1848, Ext. 123 Second and Fourth Thursday of each month, opens at 6:30 AM; Clinic ends at 9 AM.  Patients are seen on a first-come first-served basis, and a limited number are seen during each clinic.  ° °[facility] ° [address], [place], [place] (336) 723-7904   Eligibility Requirements °You must have lived in [place], or [place] counties for at least the last three months. °  You cannot be eligible for state or federal sponsored healthcare insurance, including Veterans Administration, Medicaid, or Medicare. °  You generally cannot be eligible for healthcare insurance through your employer.  °  How to apply: °Eligibility screenings are held every Tuesday and Wednesday afternoon from 1:00 pm until 4:00 pm. You do not need an appointment for the interview!  °[facility] [address], [place], [place] 336-631-2330   °[facility]  336-342-8273   °[facility]  336-703-3100   °[facility]  336-570-6415   ° °Behavioral Health Resources in the Community: °Intensive Outpatient Programs °Organization         Address  Phone  Notes  °[facility] [address], [place], [place] 336-878-6098   °[facility] [address], [place], [place] 336-832-9800   °[facility] [address], [place], [place] ° 336-882-2125   °[facility] [address],  °[place], [place] 1-800-853-5163 or 336-641-4981   °Substance Abuse Resources °Organization         Address  Phone  Notes  °[facility]  336-882-2125   °[facility]  336-784-9470   °[facility]  336-285-9073   °[facility]  336-845-3988   °Residential & Outpatient Substance Abuse Program  1-800-659-3381   °Psychological  Services °Organization         Address  Phone  Notes  °[facility]  336- 832-9600   °[facility]  336- 378-7881   °[facility] [address],  1-800-853-5163 or 336-641-4981   ° °Mobile Crisis Teams °Organization           Address  Phone  Notes  °[facility], Mobile Crisis Care Unit  1-877-626-1772   °Assertive °[facility] ° [address] [place], [place] 336-834-9664   °Sharon DeEsch [address] °[place] 336-554-5454   ° °Self-Help/Support Groups °Organization         Address  Phone             Notes  °[facility] - variety of support groups  336- 373-1402 Call for more information  °Narcotics Anonymous (NA), [facility] [address], °[place]  2 meetings at this location  ° °Residential Treatment Programs °Organization         Address  Phone  Notes  °[facility] [address],    °[place]  1-866-801-8205   °[facility] ° [address], [place], [place] 704-293-8524   °[facility] [address], [place] 336-845-3988 Admissions: 8am-3pm M-F  °[facility] [address]    °[place], [place] 336-841-1104   °[facility] [address], [place], [place] 336-379-7146   °[facility] [address]  °[place], [place] 336-285-9073   °[facility] - Intensive Outpatient [address], [place], [place] 336-852-3033   °[facility] [address]  °[place], [place] 1-877-615-2722 or 336-784-9470   °[facility] [address] [place], [place] 336-227-7417 Accepts Medicaid  °[facility] [address]  °[place] 1-800-659-3381 Substance Abuse/Addiction Treatment  ° °[place] Behavioral Health Resources °Organization         Address  Phone  Notes  °[facility]  (888)  581-9988   °Julie Brannon, PhD [address] [place], [place]   (336) 349-5553 or (336) 951-0000   °[facility]   [address] °[place], [place] (336) 349-4454   °[facility] [address], [place], [place] (336) 342-8316 Insurance/Medicaid/sponsorship through [facility]  °[facility] [address]                                    [place], [place] (336) 342-8316 Therapy/tele-psych/case  °[facility] [address]  ° [place], [place] (336) 349-2233    °Dr. Arfeen  (336) 349-4544   °[facility] [place]  [employer] [place] [facility] 1) [address], [place] °2) [address], [place] °3)  [address], [place] (336) 349-3220 °(336) 342-7768 ° °(336) 342-8140   °[place] [facility] (336) 342-1394 or (336) 342-3537 (After Hours)    ° °  °

## 2013-04-29 ENCOUNTER — Emergency Department (INDEPENDENT_AMBULATORY_CARE_PROVIDER_SITE_OTHER)
Admission: EM | Admit: 2013-04-29 | Discharge: 2013-04-29 | Disposition: A | Discharge status: Home / Self Care | Payer: Self-pay | Source: Home / Self Care | Attending: Family Medicine | Admitting: Family Medicine

## 2013-04-29 ENCOUNTER — Encounter (HOSPITAL_COMMUNITY): Payer: Self-pay | Admitting: Emergency Medicine

## 2013-04-29 DIAGNOSIS — J40 Bronchitis, not specified as acute or chronic: Secondary | ICD-10-CM

## 2013-04-29 LAB — POCT RAPID STREP A: Streptococcus, Group A Screen (Direct): NEGATIVE

## 2013-04-29 MED ORDER — PREDNISONE 10 MG PO TABS: 30.0000 mg | ORAL_TABLET | Freq: Every day | ORAL | Status: DC

## 2013-04-29 MED ORDER — METHYLPREDNISOLONE ACETATE 80 MG/ML IJ SUSP
80.0000 mg | Freq: Once | INTRAMUSCULAR | Status: AC
  Administered 2013-04-29: 80 mg via INTRAMUSCULAR

## 2013-04-29 MED ORDER — GUAIFENESIN-CODEINE 100-10 MG/5ML PO SOLN: 5.0000 mL | Freq: Every evening | ORAL | Status: DC | PRN

## 2013-04-29 MED ORDER — METHYLPREDNISOLONE ACETATE 80 MG/ML IJ SUSP
INTRAMUSCULAR | Status: AC
  Filled 2013-04-29: qty 1

## 2013-04-29 MED ORDER — IPRATROPIUM BROMIDE 0.06 % NA SOLN: 2.0000 | Freq: Four times a day (QID) | NASAL | Status: DC

## 2013-04-29 NOTE — ED Notes (Signed)
C/o lymph nodes in throat swollen, sore throat with redness and pus, neck pain, headache, cough, earache, bodyaches and congestion in her chest onset Sunday.  Cough prod. of greenish sputum today.  Low grade fever (100).

## 2013-04-29 NOTE — ED Provider Notes (Signed)
Angie Brown is a 33 y.o. female who presents to Urgent Care today for ear pain sore throat cough congestion body aches present for the last 3 days. She's tried albuterol which helps a little. She tried NyQuil which has helped a little as well. No significant trouble breathing. Cough is mildly productive. She feels well otherwise.   Past Medical History  Diagnosis Date  . Asthma   . Bronchitis   . Hypoglycemia    History  Substance Use Topics  . Smoking status: Former Smoker    Quit date: 09/03/2012  . Smokeless tobacco: Never Used  . Alcohol Use: Yes     Comment: social   ROS as above Medications: No current facility-administered medications for this encounter.   Current Outpatient Prescriptions  Medication Sig Dispense Refill  . cyclobenzaprine (FLEXERIL) 10 MG tablet Take 1 tablet (10 mg total) by mouth 2 (two) times daily as needed for muscle spasms.  20 tablet  0  . HYDROcodone-acetaminophen (NORCO) 5-325 MG per tablet Take 1 tablet by mouth every 6 (six) hours as needed for pain.  20 tablet  0  . guaiFENesin-codeine 100-10 MG/5ML syrup Take 5 mLs by mouth at bedtime as needed for cough.  120 mL  0  . ipratropium (ATROVENT) 0.06 % nasal spray Place 2 sprays into both nostrils 4 (four) times daily.  15 mL  1  . predniSONE (DELTASONE) 10 MG tablet Take 3 tablets (30 mg total) by mouth daily.  15 tablet  0    Exam:  BP 125/94  Pulse 130  Temp(Src) 98.9 F (37.2 C) (Oral)  Resp 20  SpO2 97% Gen: Well NAD HEENT: EOMI,  MMM posterior pharynx with cobblestoning. Tympanic membranes are normal appearing bilaterally Lungs: Normal work of breathing. CTABL Heart: Tachycardia but regular no MRG Abd: NABS, Soft. NT, ND Exts: Brisk capillary refill, warm and well perfused.   Results for orders placed during the hospital encounter of 04/29/13 (from the past 24 hour(s))  POCT RAPID STREP A ([facility]RG CARE ONLY)     Status: None   Collection Time    04/29/13  8:45 PM      Result  Value Range   Streptococcus, Group A Screen (Direct) NEGATIVE  NEGATIVE   No results found.  Assessment and Plan: 33 y.o. female with influenza-like illness. After the treatment whenever Tamiflu. Patient may have a component of viral bronchitis secondary to her existing asthma. Plan to continue prednisone we'll also use albuterol and codeine containing cough medication as well as Atrovent nasal spray. Followup with primary care provider as needed.  Discussed warning signs or symptoms. Please see discharge instructions. Patient expresses understanding.    Rodolph BongEvan S Winifred Balogh, MD 04/29/13 2125

## 2013-04-29 NOTE — Discharge Instructions (Signed)
Thank you for coming in today. Take prednisone daily starting tomorrow. Use Atrovent nasal spray as needed. Use codeine containing cough medication at bedtime as needed. Call or go to the emergency room if you get worse, have trouble breathing, have chest pains, or palpitations.  Continue albuterol nebulizers as needed  Bronchitis Bronchitis is inflammation of the airways that extend from the windpipe into the lungs (bronchi). The inflammation often causes mucus to develop, which leads to a cough. If the inflammation becomes severe, it may cause shortness of breath. CAUSES  Bronchitis may be caused by:   Viral infections.   Bacteria.   Cigarette smoke.   Allergens, pollutants, and other irritants.  SIGNS AND SYMPTOMS  The most common symptom of bronchitis is a frequent cough that produces mucus. Other symptoms include:  Fever.   Body aches.   Chest congestion.   Chills.   Shortness of breath.   Sore throat.  DIAGNOSIS  Bronchitis is usually diagnosed through a medical history and physical exam. Tests, such as chest X-rays, are sometimes done to rule out other conditions.  TREATMENT  You may need to avoid contact with whatever caused the problem (smoking, for example). Medicines are sometimes needed. These may include:  Antibiotics. These may be prescribed if the condition is caused by bacteria.  Cough suppressants. These may be prescribed for relief of cough symptoms.   Inhaled medicines. These may be prescribed to help open your airways and make it easier for you to breathe.   Steroid medicines. These may be prescribed for those with recurrent (chronic) bronchitis. HOME CARE INSTRUCTIONS  Get plenty of rest.   Drink enough fluids to keep your urine clear or pale yellow (unless you have a medical condition that requires fluid restriction). Increasing fluids may help thin your secretions and will prevent dehydration.   Only take over-the-counter or  prescription medicines as directed by your health care provider.  Only take antibiotics as directed. Make sure you finish them even if you start to feel better.  Avoid secondhand smoke, irritating chemicals, and strong fumes. These will make bronchitis worse. If you are a smoker, quit smoking. Consider using nicotine gum or skin patches to help control withdrawal symptoms. Quitting smoking will help your lungs heal faster.   Put a cool-mist humidifier in your bedroom at night to moisten the air. This may help loosen mucus. Change the water in the humidifier daily. You can also run the hot water in your shower and sit in the bathroom with the door closed for 5 10 minutes.   Follow up with your health care provider as directed.   Wash your hands frequently to avoid catching bronchitis again or spreading an infection to others.  SEEK MEDICAL CARE IF: Your symptoms do not improve after 1 week of treatment.  SEEK IMMEDIATE MEDICAL CARE IF:  Your fever increases.  You have chills.   You have chest pain.   You have worsening shortness of breath.   You have bloody sputum.  You faint.  You have lightheadedness.  You have a severe headache.   You vomit repeatedly. MAKE SURE YOU:   Understand these instructions.  Will watch your condition.  Will get help right away if you are not doing well or get worse. Document Released: 03/06/2005 Document Revised: 12/25/2012 Document Reviewed: 10/29/2012 [facility] Patient Information 2014 [place], [place].

## 2013-05-01 ENCOUNTER — Telehealth (HOSPITAL_COMMUNITY): Payer: Self-pay | Admitting: Family Medicine

## 2013-05-01 MED ORDER — ALBUTEROL SULFATE (2.5 MG/3ML) 0.083% IN NEBU: 2.5000 mg | INHALATION_SOLUTION | Freq: Four times a day (QID) | RESPIRATORY_TRACT | Status: DC | PRN

## 2013-05-01 MED ORDER — TUSSIONEX PENNKINETIC ER 8-10 MG/5ML PO LQCR: 5.0000 mL | Freq: Every evening | ORAL | Status: DC | PRN

## 2013-05-01 NOTE — ED Notes (Signed)
Patient called back.  She requests tussionex.  She also requests albuterol for her nebulizer.  Tussionex printed and albuterol escribed.    Rodolph BongEvan S Corey, MD 05/01/13 [phone]

## 2013-05-02 LAB — CULTURE, GROUP A STREP

## 2013-05-02 NOTE — ED Notes (Signed)
MESSAGE  WAS  LEFT  ON  ANSWERING  MACHINE      CALLED  PT  BACK  ISSUE  HAD  ALREADY  BEEN  RESOLVED

## 2013-05-09 ENCOUNTER — Telehealth (HOSPITAL_COMMUNITY): Payer: Self-pay

## 2013-05-09 NOTE — ED Notes (Signed)
Pt. called on VM 2/12 and said the cough medicine was not working.  She asked for Tussionex.  I called pt. back and she said she already got it.  I can see that Dr. Denyse Amassorey did a Rx. on 2/12. Vassie MoselleYork, Torren Maffeo M 05/09/2013

## 2013-05-19 ENCOUNTER — Emergency Department (HOSPITAL_COMMUNITY)
Admission: EM | Admit: 2013-05-19 | Discharge: 2013-05-20 | Disposition: A | Discharge status: Home / Self Care | Payer: Medicaid Other | Attending: Emergency Medicine | Admitting: Emergency Medicine

## 2013-05-19 ENCOUNTER — Encounter (HOSPITAL_COMMUNITY): Payer: Self-pay | Admitting: Emergency Medicine

## 2013-05-19 DIAGNOSIS — Z79899 Other long term (current) drug therapy: Secondary | ICD-10-CM | POA: Insufficient documentation

## 2013-05-19 DIAGNOSIS — G8929 Other chronic pain: Secondary | ICD-10-CM | POA: Insufficient documentation

## 2013-05-19 DIAGNOSIS — Z9889 Other specified postprocedural states: Secondary | ICD-10-CM | POA: Insufficient documentation

## 2013-05-19 DIAGNOSIS — R29898 Other symptoms and signs involving the musculoskeletal system: Secondary | ICD-10-CM

## 2013-05-19 DIAGNOSIS — M545 Low back pain, unspecified: Secondary | ICD-10-CM | POA: Insufficient documentation

## 2013-05-19 DIAGNOSIS — Z90710 Acquired absence of both cervix and uterus: Secondary | ICD-10-CM | POA: Insufficient documentation

## 2013-05-19 DIAGNOSIS — Z3202 Encounter for pregnancy test, result negative: Secondary | ICD-10-CM | POA: Insufficient documentation

## 2013-05-19 DIAGNOSIS — R5383 Other fatigue: Secondary | ICD-10-CM | POA: Insufficient documentation

## 2013-05-19 DIAGNOSIS — R209 Unspecified disturbances of skin sensation: Secondary | ICD-10-CM | POA: Insufficient documentation

## 2013-05-19 DIAGNOSIS — R5381 Other malaise: Secondary | ICD-10-CM | POA: Insufficient documentation

## 2013-05-19 DIAGNOSIS — Z9089 Acquired absence of other organs: Secondary | ICD-10-CM | POA: Insufficient documentation

## 2013-05-19 DIAGNOSIS — R3919 Other difficulties with micturition: Secondary | ICD-10-CM | POA: Insufficient documentation

## 2013-05-19 DIAGNOSIS — Z87891 Personal history of nicotine dependence: Secondary | ICD-10-CM | POA: Insufficient documentation

## 2013-05-19 DIAGNOSIS — R200 Anesthesia of skin: Secondary | ICD-10-CM

## 2013-05-19 DIAGNOSIS — J45909 Unspecified asthma, uncomplicated: Secondary | ICD-10-CM | POA: Insufficient documentation

## 2013-05-19 LAB — URINALYSIS, ROUTINE W REFLEX MICROSCOPIC
Bilirubin Urine: NEGATIVE
Glucose, UA: NEGATIVE mg/dL
Hgb urine dipstick: NEGATIVE
Ketones, ur: NEGATIVE mg/dL
LEUKOCYTES UA: NEGATIVE
Nitrite: NEGATIVE
PH: 5.5 (ref 5.0–8.0)
Protein, ur: NEGATIVE mg/dL
SPECIFIC GRAVITY, URINE: 1.015 (ref 1.005–1.030)
Urobilinogen, UA: 0.2 mg/dL (ref 0.0–1.0)

## 2013-05-19 LAB — POC URINE PREG, ED: Preg Test, Ur: NEGATIVE

## 2013-05-19 MED ORDER — ONDANSETRON HCL 4 MG/2ML IJ SOLN
4.0000 mg | Freq: Once | INTRAMUSCULAR | Status: AC
Start: 2013-05-19 — End: 2013-05-20
  Administered 2013-05-20: 4 mg via INTRAVENOUS
  Filled 2013-05-19: qty 2

## 2013-05-19 MED ORDER — KETOROLAC TROMETHAMINE 30 MG/ML IJ SOLN
30.0000 mg | Freq: Once | INTRAMUSCULAR | Status: AC
Start: 2013-05-19 — End: 2013-05-20
  Administered 2013-05-20: 30 mg via INTRAVENOUS
  Filled 2013-05-19: qty 1

## 2013-05-19 MED ORDER — OXYCODONE-ACETAMINOPHEN 5-325 MG PO TABS: 1.0000 | ORAL_TABLET | Freq: Once | ORAL | Status: DC

## 2013-05-19 MED ORDER — MORPHINE SULFATE 4 MG/ML IJ SOLN
4.0000 mg | Freq: Once | INTRAMUSCULAR | Status: AC
  Administered 2013-05-20: 4 mg via INTRAVENOUS
  Filled 2013-05-19: qty 1

## 2013-05-19 NOTE — ED Notes (Signed)
Pt ambulatory to restroom

## 2013-05-19 NOTE — ED Provider Notes (Signed)
CSN: [id number]     Arrival date & time 05/19/13  1600 History   First MD Initiated Contact with Patient 05/19/13 2218     Chief Complaint  Patient presents with  . Back Pain     (Consider location/radiation/quality/duration/timing/severity/associated sxs/prior Treatment) Patient is a 32 y.o. female presenting with back pain.  Back Pain  33 yo female presents with worsening back pain that got unbearable last night. Patient admits to a chronic hx of back pain with a hx of lumbar spinal fusion in 2011. Pain described as sharp stabbing pain rated 9/10 and is constant with intermittent radiation down the left leg. Pain worsened with sitting. Patient also admits to warm sensation in her groin area that started 2 weeks ago. Patient states "feels like someone poured warm water in my lap". Patient denies any fever/chills, CP, SOB, Abdominal pain, N/V/D/C, incontinence. Patient states she has difficulty stopping her urine stream once she starts, which is new for her. Patient admits to left leg weakness and numbness in back of left calf since her spinal fusion but now beginning to worsen over the past day.  Patient taken vicodin, flexeril at home with no relief. PMH significant for asthma, bronchitis, cholecystectomy, abdominal hysterectomy, and back surgery. The patient has no upper back or neck pain, no fever, no hx of cancer, IVDU, or recent spinal procedures.   Past Medical History  Diagnosis Date  . Asthma   . Bronchitis   . Hypoglycemia    Past Surgical History  Procedure Laterality Date  . Cholecystectomy    . Tonsillectomy    . Abdominal hysterectomy    . Back surgery  07/2009    spinal fusion L 5-S1   No family history on file. History  Substance Use Topics  . Smoking status: Former Smoker    Quit date: 09/03/2012  . Smokeless tobacco: Never Used  . Alcohol Use: Yes     Comment: social   OB History   Grav Para Term Preterm Abortions TAB SAB Ect Mult Living                  Review of Systems  Musculoskeletal: Positive for back pain.  All other systems reviewed and are negative.      Allergies  Loratadine; Ibuprofen; and Sumatriptan  Home Medications   Current Outpatient Rx  Name  Route  Sig  Dispense  Refill  . albuterol (PROVENTIL) (2.5 MG/3ML) 0.083% nebulizer solution   Nebulization   Take 3 mLs (2.5 mg total) by nebulization every 6 (six) hours as needed for wheezing or shortness of breath.   75 mL   2   . ipratropium (ATROVENT) 0.06 % nasal spray   Each Nare   Place 2 sprays into both nostrils 4 (four) times daily.   15 mL   1    BP 113/72  Pulse 64  Temp(Src) 98.1 F (36.7 C) (Oral)  Resp 18  SpO2 98% Physical Exam  Nursing note and vitals reviewed. Constitutional: She is oriented to person, place, and time. She appears well-developed and well-nourished. No distress.  HENT:  Head: Normocephalic and atraumatic.  Mouth/Throat: Uvula is midline, oropharynx is clear and moist and mucous membranes are normal.  Eyes: Conjunctivae and EOM are normal. Pupils are equal, round, and reactive to light.  Neck: Trachea normal and phonation normal. Neck supple. No JVD present. Carotid bruit is not present. No tracheal deviation present.  Cardiovascular: Normal rate and regular rhythm.  Exam reveals no gallop and  no friction rub.   No murmur heard. Pulmonary/Chest: Effort normal and breath sounds normal. No respiratory distress. She has no wheezes. She has no rhonchi. She has no rales.  Abdominal: Soft. Bowel sounds are normal. There is no tenderness.  Musculoskeletal: Normal range of motion. She exhibits no edema.  Midline point tenderness at thoraco lumbar junction.  Positive SLR on LEFT.  Tenderness over at LEFT sciatic notch.    Neurological: She is alert and oriented to person, place, and time. She has normal strength. No cranial nerve deficit or sensory deficit.  Reflex Scores:      Patellar reflexes are 2+ on the right side and 2+  on the left side. CN II-XII grossly intact. Cerebellar function appears intact with finger to nose exam. Patient ambulates in room without assistance.    Skin: Skin is warm and dry. She is not diaphoretic.  Psychiatric: She has a normal mood and affect. Her behavior is normal.    ED Course  Procedures (including critical care time) Labs Review Labs Reviewed  URINALYSIS, ROUTINE W REFLEX MICROSCOPIC  POC URINE PREG, ED  I-STAT CHEM 8, ED   Imaging Review No results found.   EKG Interpretation None      MDM   Final diagnoses:  Low back pain  Left leg weakness  Left leg numbness   Patient afebrile with normal VS.  E-lytes WNL Urine Preg negative UA negative   Patient's Pain improved with tx in ED. Patient currently waiting for MRI. Concern for possible cord compression given patient's recent symptoms. Disposition pending MRI results.   Discussed patient with and signed over to Dr. Micheline Mazeocherty at shift change.   Meds given in ED:  Medications  ketorolac (TORADOL) 30 MG/ML injection 30 mg (30 mg Intravenous Given 05/20/13 0101)  ondansetron (ZOFRAN) injection 4 mg (4 mg Intravenous Given 05/20/13 0101)  morphine 4 MG/ML injection 4 mg (4 mg Intravenous Given 05/20/13 0101)  LORazepam (ATIVAN) tablet 1 mg (1 mg Oral Given 05/20/13 0209)    New Prescriptions   No medications on file     Rudene AndaJacob Gray Jerimie Mancuso, PA-C 05/20/13 [id number]

## 2013-05-19 NOTE — ED Notes (Signed)
LE weakness per patient

## 2013-05-19 NOTE — ED Notes (Signed)
Pt has back fusion.  Pt is here complaining of lower back pain, right hip pain, and left groin pain.  Started having warm sensation over bladder area every 2-3 minutes.  No bowel or bladder incontinence

## 2013-05-19 NOTE — ED Notes (Signed)
Pt states she feels like shes peeing on herself. Warm sensation to left groin area. Pt is continent.

## 2013-05-20 ENCOUNTER — Emergency Department (HOSPITAL_COMMUNITY): Payer: Medicaid Other

## 2013-05-20 LAB — I-STAT CHEM 8, ED
BUN: 6 mg/dL (ref 6–23)
Calcium, Ion: 1.21 mmol/L (ref 1.12–1.23)
Chloride: 100 mEq/L (ref 96–112)
Creatinine, Ser: 0.8 mg/dL (ref 0.50–1.10)
Glucose, Bld: 97 mg/dL (ref 70–99)
HEMATOCRIT: 37 % (ref 36.0–46.0)
HEMOGLOBIN: 12.6 g/dL (ref 12.0–15.0)
POTASSIUM: 4.1 meq/L (ref 3.7–5.3)
SODIUM: 140 meq/L (ref 137–147)
TCO2: 25 mmol/L (ref 0–100)

## 2013-05-20 MED ORDER — ONDANSETRON HCL 4 MG/2ML IJ SOLN
4.0000 mg | Freq: Once | INTRAMUSCULAR | Status: AC
  Administered 2013-05-20: 4 mg via INTRAVENOUS
  Filled 2013-05-20: qty 2

## 2013-05-20 MED ORDER — MORPHINE SULFATE 4 MG/ML IJ SOLN
4.0000 mg | Freq: Once | INTRAMUSCULAR | Status: DC
  Filled 2013-05-20: qty 1

## 2013-05-20 MED ORDER — GADOBENATE DIMEGLUMINE 529 MG/ML IV SOLN
20.0000 mL | Freq: Once | INTRAVENOUS | Status: AC
  Administered 2013-05-20: 20 mL via INTRAVENOUS

## 2013-05-20 MED ORDER — OXYCODONE-ACETAMINOPHEN 5-325 MG PO TABS: 1.0000 | ORAL_TABLET | Freq: Four times a day (QID) | ORAL | Status: DC | PRN

## 2013-05-20 MED ORDER — OXYCODONE-ACETAMINOPHEN 5-325 MG PO TABS
2.0000 | ORAL_TABLET | Freq: Once | ORAL | Status: AC
  Administered 2013-05-20: 2 via ORAL
  Filled 2013-05-20: qty 2

## 2013-05-20 MED ORDER — LORAZEPAM 1 MG PO TABS
1.0000 mg | ORAL_TABLET | Freq: Once | ORAL | Status: AC
  Administered 2013-05-20: 1 mg via ORAL
  Filled 2013-05-20: qty 1

## 2013-05-20 MED ORDER — SODIUM CHLORIDE 0.9 % IV SOLN
Freq: Once | INTRAVENOUS | Status: AC
  Administered 2013-05-20: 04:00:00 via INTRAVENOUS

## 2013-05-20 NOTE — ED Notes (Signed)
Pt states in addition to the back pain in the Lumbar to sacral area is "a warm sensation that she feels coming over her in the vaginal area."  Pt states almost like a "feeling you are going to pee your pants but you don't".

## 2013-05-20 NOTE — ED Notes (Signed)
Pt verbalized discharge follow up. Pt states family would be taking her home from the ED at discharge.  Pt ambulatory with no assistive devices at d/c.

## 2013-05-20 NOTE — ED Notes (Signed)
Alert, NAD, calm, interactive.  

## 2013-05-20 NOTE — ED Notes (Signed)
[phone] Denice BorsAshley Gilliam (friend/ride)

## 2013-05-20 NOTE — Discharge Instructions (Signed)
Chronic Back Pain   When back pain lasts longer than 3 months, it is called chronic back pain.People with chronic back pain often go through certain periods that are more intense (flare-ups).   CAUSES  Chronic back pain can be caused by wear and tear (degeneration) on different structures in your back. These structures include:   The bones of your spine (vertebrae) and the joints surrounding your spinal cord and nerve roots (facets).   The strong, fibrous tissues that connect your vertebrae (ligaments).  Degeneration of these structures may result in pressure on your nerves. This can lead to constant pain.  HOME CARE INSTRUCTIONS   Avoid bending, heavy lifting, prolonged sitting, and activities which make the problem worse.   Take brief periods of rest throughout the day to reduce your pain. Lying down or standing usually is better than sitting while you are resting.   Take over-the-counter or prescription medicines only as directed by your caregiver.  SEEK IMMEDIATE MEDICAL CARE IF:    You have weakness or numbness in one of your legs or feet.   You have trouble controlling your bladder or bowels.   You have nausea, vomiting, abdominal pain, shortness of breath, or fainting.  Document Released: 04/13/2004 Document Revised: 05/29/2011 Document Reviewed: 02/18/2011  [employer] Patient Information 2014 [employer].

## 2013-05-20 NOTE — ED Provider Notes (Signed)
Medical screening examination/treatment/procedure(s) were conducted as a shared visit with non-physician practitioner(s) and myself.  I personally evaluated the patient during the encounter. After MRI, pt appearing comfortable, VSS. She was given MRI results which are stable, show no evidence of acute cord compression. Will d/c home w/ plan for her to f/u with [facility].  Return precautions given for new or worsening symptoms including worsening focal neuro complaints.    EKG Interpretation None        Shanna CiscoMegan E Ramzey Petrovic, MD 05/20/13 [phone]

## 2013-07-14 ENCOUNTER — Other Ambulatory Visit: Payer: Self-pay | Admitting: Neurosurgery

## 2013-07-14 DIAGNOSIS — M47817 Spondylosis without myelopathy or radiculopathy, lumbosacral region: Secondary | ICD-10-CM

## 2013-07-18 ENCOUNTER — Ambulatory Visit
Admission: RE | Admit: 2013-07-18 | Discharge: 2013-07-18 | Disposition: A | Discharge status: Home / Self Care | Payer: Medicaid Other | Source: Ambulatory Visit | Attending: Neurosurgery | Admitting: Neurosurgery

## 2013-07-18 DIAGNOSIS — M47817 Spondylosis without myelopathy or radiculopathy, lumbosacral region: Secondary | ICD-10-CM

## 2013-07-23 DIAGNOSIS — M533 Sacrococcygeal disorders, not elsewhere classified: Secondary | ICD-10-CM | POA: Insufficient documentation

## 2013-10-07 ENCOUNTER — Emergency Department (HOSPITAL_COMMUNITY): Payer: Medicaid Other

## 2013-10-07 ENCOUNTER — Encounter (HOSPITAL_COMMUNITY): Payer: Self-pay | Admitting: Emergency Medicine

## 2013-10-07 ENCOUNTER — Emergency Department (HOSPITAL_COMMUNITY)
Admission: EM | Admit: 2013-10-07 | Discharge: 2013-10-07 | Disposition: A | Discharge status: Home / Self Care | Payer: Medicaid Other | Attending: Emergency Medicine | Admitting: Emergency Medicine

## 2013-10-07 DIAGNOSIS — W010XXA Fall on same level from slipping, tripping and stumbling without subsequent striking against object, initial encounter: Secondary | ICD-10-CM | POA: Insufficient documentation

## 2013-10-07 DIAGNOSIS — Y9289 Other specified places as the place of occurrence of the external cause: Secondary | ICD-10-CM | POA: Insufficient documentation

## 2013-10-07 DIAGNOSIS — Z79899 Other long term (current) drug therapy: Secondary | ICD-10-CM | POA: Insufficient documentation

## 2013-10-07 DIAGNOSIS — S8990XA Unspecified injury of unspecified lower leg, initial encounter: Secondary | ICD-10-CM | POA: Diagnosis present

## 2013-10-07 DIAGNOSIS — IMO0002 Reserved for concepts with insufficient information to code with codable children: Secondary | ICD-10-CM | POA: Diagnosis not present

## 2013-10-07 DIAGNOSIS — Z87891 Personal history of nicotine dependence: Secondary | ICD-10-CM | POA: Insufficient documentation

## 2013-10-07 DIAGNOSIS — T148XXA Other injury of unspecified body region, initial encounter: Secondary | ICD-10-CM

## 2013-10-07 DIAGNOSIS — M25562 Pain in left knee: Secondary | ICD-10-CM

## 2013-10-07 DIAGNOSIS — Y9389 Activity, other specified: Secondary | ICD-10-CM | POA: Diagnosis not present

## 2013-10-07 DIAGNOSIS — Z8639 Personal history of other endocrine, nutritional and metabolic disease: Secondary | ICD-10-CM | POA: Insufficient documentation

## 2013-10-07 DIAGNOSIS — W1809XA Striking against other object with subsequent fall, initial encounter: Secondary | ICD-10-CM | POA: Diagnosis not present

## 2013-10-07 DIAGNOSIS — J45909 Unspecified asthma, uncomplicated: Secondary | ICD-10-CM | POA: Diagnosis not present

## 2013-10-07 DIAGNOSIS — Z862 Personal history of diseases of the blood and blood-forming organs and certain disorders involving the immune mechanism: Secondary | ICD-10-CM | POA: Diagnosis not present

## 2013-10-07 DIAGNOSIS — S99919A Unspecified injury of unspecified ankle, initial encounter: Secondary | ICD-10-CM | POA: Diagnosis present

## 2013-10-07 DIAGNOSIS — W19XXXA Unspecified fall, initial encounter: Secondary | ICD-10-CM

## 2013-10-07 MED ORDER — BACITRACIN 500 UNIT/GM EX OINT
1.0000 "application " | TOPICAL_OINTMENT | Freq: Two times a day (BID) | CUTANEOUS | Status: DC
  Administered 2013-10-07: 1 via TOPICAL
  Filled 2013-10-07: qty 0.9

## 2013-10-07 MED ORDER — HYDROCODONE-ACETAMINOPHEN 5-325 MG PO TABS
2.0000 | ORAL_TABLET | Freq: Once | ORAL | Status: AC
  Administered 2013-10-07: 2 via ORAL
  Filled 2013-10-07: qty 2

## 2013-10-07 NOTE — ED Notes (Signed)
Per EMS: pt slipped on curb, falling hitting, scraping left knee, c/o left knee pain. Pt has minor abrasion to left cheek. No LOC.

## 2013-10-07 NOTE — ED Provider Notes (Signed)
CSN: [id number]     Arrival date & time 10/07/13  1534 History  This chart was scribed for non-physician practitioner working with Toy Baker, MD, by Modena Jansky, ED Scribe. This patient was seen in room WTR8/WTR8 and the patient's care was started at 3:46 PM    Chief Complaint  Patient presents with  . Fall   HPI HPI Comments: Angie Brown is a 33 y.o. female who presents to the Emergency Department complaining of a fall that occurred today. She states that she slipped on a curb and landed on her left knee and face. She denies any LOC. She states that she has associated left knee pain, and abrasions. She reports numbness in her jaw. She reports that she is not able to ambulate. She states that her Tetanus is UTD.   Past Medical History  Diagnosis Date  . Asthma   . Bronchitis   . Hypoglycemia    Past Surgical History  Procedure Laterality Date  . Cholecystectomy    . Tonsillectomy    . Abdominal hysterectomy    . Back surgery  07/2009    spinal fusion L 5-S1   No family history on file. History  Substance Use Topics  . Smoking status: Former Smoker    Quit date: 09/03/2012  . Smokeless tobacco: Never Used  . Alcohol Use: Yes     Comment: social   OB History   Grav Para Term Preterm Abortions TAB SAB Ect Mult Living                 Review of Systems  Musculoskeletal: Positive for myalgias.  Skin: Positive for wound.  Neurological: Positive for numbness. Negative for syncope.    Allergies  Loratadine; Ibuprofen; and Sumatriptan  Home Medications   Prior to Admission medications   Medication Sig Start Date End Date Taking? Authorizing Provider  albuterol (PROVENTIL) (2.5 MG/3ML) 0.083% nebulizer solution Take 3 mLs (2.5 mg total) by nebulization every 6 (six) hours as needed for wheezing or shortness of breath. 05/01/13   Rodolph Bong, MD  ipratropium (ATROVENT) 0.06 % nasal spray Place 2 sprays into both nostrils 4 (four) times daily. 04/29/13   Rodolph Bong,  MD  oxyCODONE-acetaminophen (PERCOCET) 5-325 MG per tablet Take 1 tablet by mouth every 6 (six) hours as needed. 05/20/13   Shanna Cisco, MD   BP 139/91  Pulse 106  Temp(Src) 98.6 F (37 C) (Oral)  Resp 20  SpO2 98% Physical Exam  Nursing note and vitals reviewed. Constitutional: She is oriented to person, place, and time. She appears well-developed and well-nourished. No distress.  HENT:  Head: Normocephalic and atraumatic.  Very small mild abrasion to left cheek. TTP of left side of mandible but no pain with speaking. No obvious bone abnormality or deformity. No dental trauma.   Neck: Neck supple. No tracheal deviation present.  Cardiovascular: Normal rate.   Pulmonary/Chest: Effort normal. No respiratory distress.  Musculoskeletal: She exhibits tenderness.  Left knee TTP diffusely. No obvious swelling. No obvious bone abnormality or deformity. ROM and strength deferred secondary to pain.   Neurological: She is alert and oriented to person, place, and time.  Skin: Skin is warm and dry.  2 mild abrasions to left knee. No lacerations.   Psychiatric: She has a normal mood and affect. Her behavior is normal.    ED Course  Procedures (including critical care time) DIAGNOSTIC STUDIES: Oxygen Saturation is 98% on RA, normal by my interpretation.  COORDINATION OF CARE: 3:50 PM- Pt advised of plan for treatment which includes medication and radiology and pt agrees.  Labs Review Labs Reviewed - No data to display  Imaging Review Dg Orthopantogram  10/07/2013   CLINICAL DATA:  Larey SeatFell hitting left side of jaw  EXAM: ORTHOPANTOGRAM/PANORAMIC  COMPARISON:  None.  FINDINGS: A Panorex view of the mandible shows no mandibular fracture. Both mandibular condyles appear to be intact and normally positioned. No dental abnormality is seen.  IMPRESSION: Negative   Electronically Signed   By: Dwyane DeePaul  Barry M.D.   On: 10/07/2013 16:38   Dg Knee Complete 4 Views Left  10/07/2013   CLINICAL DATA:   Larey SeatFell.  Left knee pain.  EXAM: LEFT KNEE - COMPLETE 4+ VIEW  COMPARISON:  None.  FINDINGS: The joint spaces are maintained. No acute fractures identified. No osteochondral abnormality. No joint effusion.  IMPRESSION: No acute bony findings.   Electronically Signed   By: Loralie ChampagneMark  Gallerani M.D.   On: 10/07/2013 16:22     EKG Interpretation None      MDM   Final diagnoses:  Fall, initial encounter  Left knee pain  Abrasion    Patient with left knee pain, and abrasions to left knee, and cheek. Plain films are negative. DC to home. Wound care in the ED.  I personally performed the services described in this documentation, which was scribed in my presence. The recorded information has been reviewed and is accurate.      Roxy Horsemanobert Malyia Moro, PA-C 10/07/13 1700

## 2013-10-07 NOTE — Discharge Instructions (Signed)
Abrasions An abrasion is a cut or scrape of the skin. Abrasions do not go through all layers of the skin. HOME CARE  If a bandage (dressing) was put on your wound, change it as told by your doctor. If the bandage sticks, soak it off with warm.  Wash the area with water and soap 2 times a day. Rinse off the soap. Pat the area dry with a clean towel.  Put on medicated cream (ointment) as told by your doctor.  Change your bandage right away if it gets wet or dirty.  Only take medicine as told by your doctor.  See your doctor within 24-48 hours to get your wound checked.  Check your wound for redness, puffiness (swelling), or yellowish-white fluid (pus). GET HELP RIGHT AWAY IF:   You have more pain in the wound.  You have redness, swelling, or tenderness around the wound.  You have pus coming from the wound.  You have a fever or lasting symptoms for more than 2-3 days.  You have a fever and your symptoms suddenly get worse.  You have a bad smell coming from the wound or bandage. MAKE SURE YOU:   Understand these instructions.  Will watch your condition.  Will get help right away if you are not doing well or get worse. Document Released: 08/23/2007 Document Revised: 11/29/2011 Document Reviewed: 02/07/2011 [facility] Patient Information 2015 [name], [place]. This information is not intended to replace advice given to you by your health care provider. Make sure you discuss any questions you have with your health care provider. Knee Pain The knee is the complex joint between your thigh and your lower leg. It is made up of bones, tendons, ligaments, and cartilage. The bones that make up the knee are:  The femur in the thigh.  The tibia and fibula in the lower leg.  The patella or kneecap riding in the groove on the lower femur. CAUSES  Knee pain is a common complaint with many causes. A few of these causes are:  Injury, such as:  A ruptured ligament or tendon injury.  Torn  cartilage.  Medical conditions, such as:  Gout  Arthritis  Infections  Overuse, over training, or overdoing a physical activity. Knee pain can be minor or severe. Knee pain can accompany debilitating injury. Minor knee problems often respond well to self-care measures or get well on their own. More serious injuries may need medical intervention or even surgery. SYMPTOMS The knee is complex. Symptoms of knee problems can vary widely. Some of the problems are:  Pain with movement and weight bearing.  Swelling and tenderness.  Buckling of the knee.  Inability to straighten or extend your knee.  Your knee locks and you cannot straighten it.  Warmth and redness with pain and fever.  Deformity or dislocation of the kneecap. DIAGNOSIS  Determining what is wrong may be very straight forward such as when there is an injury. It can also be challenging because of the complexity of the knee. Tests to make a diagnosis may include:  Your caregiver taking a history and doing a physical exam.  Routine X-rays can be used to rule out other problems. X-rays will not reveal a cartilage tear. Some injuries of the knee can be diagnosed by:  Arthroscopy a surgical technique by which a small video camera is inserted through tiny incisions on the sides of the knee. This procedure is used to examine and repair internal knee joint problems. Tiny instruments can be used during  arthroscopy to repair the torn knee cartilage (meniscus).  Arthrography is a radiology technique. A contrast liquid is directly injected into the knee joint. Internal structures of the knee joint then become visible on X-ray film.  An MRI scan is a non X-ray radiology procedure in which magnetic fields and a computer produce two- or three-dimensional images of the inside of the knee. Cartilage tears are often visible using an MRI scanner. MRI scans have largely replaced arthrography in diagnosing cartilage tears of the  knee.  Blood work.  Examination of the fluid that helps to lubricate the knee joint (synovial fluid). This is done by taking a sample out using a needle and a syringe. TREATMENT The treatment of knee problems depends on the cause. Some of these treatments are:  Depending on the injury, proper casting, splinting, surgery, or physical therapy care will be needed.  Give yourself adequate recovery time. Do not overuse your joints. If you begin to get sore during workout routines, back off. Slow down or do fewer repetitions.  For repetitive activities such as cycling or running, maintain your strength and nutrition.  Alternate muscle groups. For example, if you are a weight lifter, work the upper body on one day and the lower body the next.  Either tight or weak muscles do not give the proper support for your knee. Tight or weak muscles do not absorb the stress placed on the knee joint. Keep the muscles surrounding the knee strong.  Take care of mechanical problems.  If you have flat feet, orthotics or special shoes may help. See your caregiver if you need help.  Arch supports, sometimes with wedges on the inner or outer aspect of the heel, can help. These can shift pressure away from the side of the knee most bothered by osteoarthritis.  A brace called an "unloader" brace also may be used to help ease the pressure on the most arthritic side of the knee.  If your caregiver has prescribed crutches, braces, wraps or ice, use as directed. The acronym for this is PRICE. This means protection, rest, ice, compression, and elevation.  Nonsteroidal anti-inflammatory drugs (NSAIDs), can help relieve pain. But if taken immediately after an injury, they may actually increase swelling. Take NSAIDs with food in your stomach. Stop them if you develop stomach problems. Do not take these if you have a history of ulcers, stomach pain, or bleeding from the bowel. Do not take without your caregiver's approval if  you have problems with fluid retention, heart failure, or kidney problems.  For ongoing knee problems, physical therapy may be helpful.  Glucosamine and chondroitin are over-the-counter dietary supplements. Both may help relieve the pain of osteoarthritis in the knee. These medicines are different from the usual anti-inflammatory drugs. Glucosamine may decrease the rate of cartilage destruction.  Injections of a corticosteroid drug into your knee joint may help reduce the symptoms of an arthritis flare-up. They may provide pain relief that lasts a few months. You may have to wait a few months between injections. The injections do have a small increased risk of infection, water retention, and elevated blood sugar levels.  Hyaluronic acid injected into damaged joints may ease pain and provide lubrication. These injections may work by reducing inflammation. A series of shots may give relief for as long as 6 months.  Topical painkillers. Applying certain ointments to your skin may help relieve the pain and stiffness of osteoarthritis. Ask your pharmacist for suggestions. Many over the-counter products are approved for temporary  relief of arthritis pain.  In some countries, doctors often prescribe topical NSAIDs for relief of chronic conditions such as arthritis and tendinitis. A review of treatment with NSAID creams found that they worked as well as oral medications but without the serious side effects. PREVENTION  Maintain a healthy weight. Extra pounds put more strain on your joints.  Get strong, stay limber. Weak muscles are a common cause of knee injuries. Stretching is important. Include flexibility exercises in your workouts.  Be smart about exercise. If you have osteoarthritis, chronic knee pain or recurring injuries, you may need to change the way you exercise. This does not mean you have to stop being active. If your knees ache after jogging or playing basketball, consider switching to  swimming, water aerobics, or other low-impact activities, at least for a few days a week. Sometimes limiting high-impact activities will provide relief.  Make sure your shoes fit well. Choose footwear that is right for your sport.  Protect your knees. Use the proper gear for knee-sensitive activities. Use kneepads when playing volleyball or laying carpet. Buckle your seat belt every time you drive. Most shattered kneecaps occur in car accidents.  Rest when you are tired. SEEK MEDICAL CARE IF:  You have knee pain that is continual and does not seem to be getting better.  SEEK IMMEDIATE MEDICAL CARE IF:  Your knee joint feels hot to the touch and you have a high fever. MAKE SURE YOU:   Understand these instructions.  Will watch your condition.  Will get help right away if you are not doing well or get worse. Document Released: 01/01/2007 Document Revised: 05/29/2011 Document Reviewed: 01/01/2007 [facility] Patient Information 2015 [place], [place]. This information is not intended to replace advice given to you by your health care provider. Make sure you discuss any questions you have with your health care provider.

## 2013-10-08 NOTE — ED Provider Notes (Signed)
Medical screening examination/treatment/procedure(s) were performed by non-physician practitioner and as supervising physician I was immediately available for consultation/collaboration.   Bobbi Yount T Myles Tavella, MD 10/08/13 1708 

## 2013-11-13 ENCOUNTER — Emergency Department (HOSPITAL_COMMUNITY)
Admission: EM | Admit: 2013-11-13 | Discharge: 2013-11-13 | Discharge status: Against Medical Advice | Payer: Medicaid Other

## 2014-02-25 ENCOUNTER — Encounter (HOSPITAL_COMMUNITY): Payer: Self-pay | Admitting: *Deleted

## 2014-03-10 MED ORDER — CEFAZOLIN SODIUM-DEXTROSE 2-3 GM-% IV SOLR
2.0000 g | INTRAVENOUS | Status: AC
  Administered 2014-03-11: 2 g via INTRAVENOUS

## 2014-03-10 NOTE — H&P (Signed)
Angie FlemingsJennie L Brown is an 33 y.o. female, P2103, s/p LAVH for chronic pain in 2010, c/o increasing urine leak. Leaks with cough, laugh, sneeze, riding horse, also recently with coitus. No urgency or frequency. She has been trying to do kegels, no help.  Urodynamics confirm SUI, she wants to proceed with surgical treatment.    Pertinent Gynecological History: OB History: G3, P2103 SVD x 3   Menstrual History: No LMP recorded. Patient has had a hysterectomy.    Past Medical History  Diagnosis Date  . Asthma   . Bronchitis   . Hypoglycemia   . PONV (postoperative nausea and vomiting)     pt prefers scop patch  . Dysrhythmia     PVCs- stress related    Past Surgical History  Procedure Laterality Date  . Cholecystectomy    . Tonsillectomy    . Abdominal hysterectomy    . Back surgery  07/2009    spinal fusion L 5-S1    History reviewed. No pertinent family history.  Social History:  reports that she has been smoking Cigarettes.  She has been smoking about 0.50 packs per day. She has never used smokeless tobacco. She reports that she drinks alcohol. She reports that she does not use illicit drugs.  Allergies:  Allergies  Allergen Reactions  . Loratadine Shortness Of Breath, Swelling and Palpitations    hypoventilation   . Doxycycline Nausea And Vomiting  . Ibuprofen Nausea And Vomiting    GI upset  . Sumatriptan Other (See Comments)    REACTION: sinus pain, sinus on fire and face hurts    No prescriptions prior to admission    Review of Systems  Respiratory: Negative.   Cardiovascular: Negative.   Gastrointestinal: Negative.   Genitourinary: Negative.     There were no vitals taken for this visit. Physical Exam  Constitutional: She appears well-developed and well-nourished.  Neck: Neck supple. No thyromegaly present.  Cardiovascular: Normal rate, regular rhythm and normal heart sounds.   No murmur heard. Respiratory: Effort normal and breath sounds normal. No  respiratory distress. She has no wheezes.  GI: Soft. She exhibits no distension and no mass. There is no tenderness.  Genitourinary:  No significant cystocele or rectocels Urethra hypermobile Cervix and uterus absent No adnexal mass    No results found for this or any previous visit (from the past 24 hour(s)).  No results found.  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  All medical and surgical options discussed, risks of surgery and about 90% chance of improving symptoms discussed.  Risks specifically associated with mesh discussed.  Will admit for Solyx sling.    Dashton Czerwinski D 03/10/2014, 8:24 PM

## 2014-03-11 ENCOUNTER — Encounter (HOSPITAL_COMMUNITY)
Admission: RE | Disposition: A | Discharge status: Home / Self Care | Payer: Self-pay | Source: Ambulatory Visit | Attending: Obstetrics and Gynecology

## 2014-03-11 ENCOUNTER — Ambulatory Visit (HOSPITAL_COMMUNITY): Payer: Medicaid Other | Admitting: Anesthesiology

## 2014-03-11 ENCOUNTER — Encounter (HOSPITAL_COMMUNITY): Payer: Self-pay | Admitting: Anesthesiology

## 2014-03-11 ENCOUNTER — Ambulatory Visit (HOSPITAL_COMMUNITY)
Admission: RE | Admit: 2014-03-11 | Discharge: 2014-03-11 | Disposition: A | Discharge status: Home / Self Care | Payer: Medicaid Other | Source: Ambulatory Visit | Attending: Obstetrics and Gynecology | Admitting: Obstetrics and Gynecology

## 2014-03-11 DIAGNOSIS — J45909 Unspecified asthma, uncomplicated: Secondary | ICD-10-CM | POA: Diagnosis not present

## 2014-03-11 DIAGNOSIS — F17210 Nicotine dependence, cigarettes, uncomplicated: Secondary | ICD-10-CM | POA: Diagnosis not present

## 2014-03-11 DIAGNOSIS — N3641 Hypermobility of urethra: Secondary | ICD-10-CM | POA: Insufficient documentation

## 2014-03-11 DIAGNOSIS — N393 Stress incontinence (female) (male): Secondary | ICD-10-CM | POA: Insufficient documentation

## 2014-03-11 DIAGNOSIS — E162 Hypoglycemia, unspecified: Secondary | ICD-10-CM | POA: Insufficient documentation

## 2014-03-11 HISTORY — PX: BLADDER SUSPENSION: SHX72

## 2014-03-11 HISTORY — DX: Other specified postprocedural states: Z98.890

## 2014-03-11 HISTORY — DX: Cardiac arrhythmia, unspecified: I49.9

## 2014-03-11 HISTORY — DX: Nausea with vomiting, unspecified: R11.2

## 2014-03-11 LAB — CBC
HCT: 39.6 % (ref 36.0–46.0)
Hemoglobin: 13.6 g/dL (ref 12.0–15.0)
MCH: 33.3 pg (ref 26.0–34.0)
MCHC: 34.3 g/dL (ref 30.0–36.0)
MCV: 96.8 fL (ref 78.0–100.0)
PLATELETS: 250 10*3/uL (ref 150–400)
RBC: 4.09 MIL/uL (ref 3.87–5.11)
RDW: 12.8 % (ref 11.5–15.5)
WBC: 9.9 10*3/uL (ref 4.0–10.5)

## 2014-03-11 SURGERY — TRANSVAGINAL TAPE (TVT) PROCEDURE
Anesthesia: General | Site: Vagina

## 2014-03-11 MED ORDER — FENTANYL CITRATE 0.05 MG/ML IJ SOLN
INTRAMUSCULAR | Status: AC
  Filled 2014-03-11: qty 5

## 2014-03-11 MED ORDER — ONDANSETRON HCL 4 MG/2ML IJ SOLN
INTRAMUSCULAR | Status: AC
  Filled 2014-03-11: qty 2

## 2014-03-11 MED ORDER — FENTANYL CITRATE 0.05 MG/ML IJ SOLN
INTRAMUSCULAR | Status: AC
  Administered 2014-03-11: 50 ug via INTRAVENOUS
  Filled 2014-03-11: qty 2

## 2014-03-11 MED ORDER — DEXAMETHASONE SODIUM PHOSPHATE 4 MG/ML IJ SOLN
INTRAMUSCULAR | Status: DC | PRN
  Administered 2014-03-11: 10 mg via INTRAVENOUS

## 2014-03-11 MED ORDER — STERILE WATER FOR IRRIGATION IR SOLN
Status: DC | PRN
  Administered 2014-03-11: 1000 mL

## 2014-03-11 MED ORDER — KETOROLAC TROMETHAMINE 30 MG/ML IJ SOLN
30.0000 mg | Freq: Once | INTRAMUSCULAR | Status: AC
  Administered 2014-03-11: 30 mg via INTRAVENOUS

## 2014-03-11 MED ORDER — HYDROMORPHONE HCL 1 MG/ML IJ SOLN
INTRAMUSCULAR | Status: AC
  Filled 2014-03-11: qty 1

## 2014-03-11 MED ORDER — KETOROLAC TROMETHAMINE 60 MG/2ML IM SOLN
INTRAMUSCULAR | Status: AC
  Administered 2014-03-11: 30 mg via INTRAMUSCULAR
  Filled 2014-03-11: qty 2

## 2014-03-11 MED ORDER — HYDROMORPHONE HCL 1 MG/ML IJ SOLN
0.2500 mg | INTRAMUSCULAR | Status: DC | PRN
  Administered 2014-03-11 (×6): 0.5 mg via INTRAVENOUS

## 2014-03-11 MED ORDER — ONDANSETRON HCL 4 MG/2ML IJ SOLN
INTRAMUSCULAR | Status: DC | PRN
  Administered 2014-03-11: 4 mg via INTRAVENOUS

## 2014-03-11 MED ORDER — GLYCOPYRROLATE 0.2 MG/ML IJ SOLN
INTRAMUSCULAR | Status: AC
  Filled 2014-03-11: qty 1

## 2014-03-11 MED ORDER — CEFAZOLIN SODIUM-DEXTROSE 2-3 GM-% IV SOLR
INTRAVENOUS | Status: AC
  Filled 2014-03-11: qty 50

## 2014-03-11 MED ORDER — LIDOCAINE HCL (CARDIAC) 20 MG/ML IV SOLN
INTRAVENOUS | Status: AC
Start: 2014-03-11 — End: 2014-03-11
  Filled 2014-03-11: qty 5

## 2014-03-11 MED ORDER — NAPROXEN SODIUM 550 MG PO TABS: 550.0000 mg | ORAL_TABLET | Freq: Two times a day (BID) | ORAL | Status: DC

## 2014-03-11 MED ORDER — HYDROCODONE-ACETAMINOPHEN 5-325 MG PO TABS: 1.0000 | ORAL_TABLET | Freq: Four times a day (QID) | ORAL | Status: DC | PRN

## 2014-03-11 MED ORDER — ACETAMINOPHEN 325 MG PO TABS
650.0000 mg | ORAL_TABLET | Freq: Once | ORAL | Status: AC
  Administered 2014-03-11: 650 mg via ORAL

## 2014-03-11 MED ORDER — MIDAZOLAM HCL 2 MG/2ML IJ SOLN
INTRAMUSCULAR | Status: AC
  Filled 2014-03-11: qty 2

## 2014-03-11 MED ORDER — LIDOCAINE HCL 1 % IJ SOLN
INTRAMUSCULAR | Status: AC
  Filled 2014-03-11: qty 20

## 2014-03-11 MED ORDER — HYDROMORPHONE HCL 1 MG/ML IJ SOLN
INTRAMUSCULAR | Status: AC
  Administered 2014-03-11: 0.5 mg via INTRAVENOUS
  Filled 2014-03-11: qty 1

## 2014-03-11 MED ORDER — PROPOFOL 10 MG/ML IV EMUL
INTRAVENOUS | Status: AC
  Filled 2014-03-11: qty 20

## 2014-03-11 MED ORDER — ONDANSETRON HCL 4 MG/2ML IJ SOLN
4.0000 mg | Freq: Once | INTRAMUSCULAR | Status: AC
  Administered 2014-03-11: 4 mg via INTRAVENOUS

## 2014-03-11 MED ORDER — SCOPOLAMINE 1 MG/3DAYS TD PT72
MEDICATED_PATCH | TRANSDERMAL | Status: AC
  Administered 2014-03-11: 1.5 mg via TRANSDERMAL
  Filled 2014-03-11: qty 1

## 2014-03-11 MED ORDER — BUPIVACAINE-EPINEPHRINE 0.5% -1:200000 IJ SOLN
INTRAMUSCULAR | Status: DC | PRN
  Administered 2014-03-11: 10 mL

## 2014-03-11 MED ORDER — BUPIVACAINE-EPINEPHRINE (PF) 0.5% -1:200000 IJ SOLN
INTRAMUSCULAR | Status: AC
  Filled 2014-03-11: qty 30

## 2014-03-11 MED ORDER — ACETAMINOPHEN 325 MG PO TABS
ORAL_TABLET | ORAL | Status: AC
  Filled 2014-03-11: qty 2

## 2014-03-11 MED ORDER — ONDANSETRON HCL 4 MG/2ML IJ SOLN: 4.0000 mg | Freq: Once | INTRAMUSCULAR | Status: DC | PRN

## 2014-03-11 MED ORDER — LIDOCAINE HCL (CARDIAC) 20 MG/ML IV SOLN
INTRAVENOUS | Status: DC | PRN
  Administered 2014-03-11: 40 mg via INTRAVENOUS
  Administered 2014-03-11: 30 mg via INTRAVENOUS

## 2014-03-11 MED ORDER — DIPHENHYDRAMINE HCL 50 MG/ML IJ SOLN
12.5000 mg | Freq: Once | INTRAMUSCULAR | Status: AC
  Administered 2014-03-11: 12.5 mg via INTRAVENOUS

## 2014-03-11 MED ORDER — FENTANYL CITRATE 0.05 MG/ML IJ SOLN
INTRAMUSCULAR | Status: DC | PRN
  Administered 2014-03-11 (×3): 50 ug via INTRAVENOUS
  Administered 2014-03-11: 100 ug via INTRAVENOUS

## 2014-03-11 MED ORDER — SCOPOLAMINE 1 MG/3DAYS TD PT72
1.0000 | MEDICATED_PATCH | Freq: Once | TRANSDERMAL | Status: DC
  Administered 2014-03-11: 1.5 mg via TRANSDERMAL

## 2014-03-11 MED ORDER — KETOROLAC TROMETHAMINE 30 MG/ML IJ SOLN: 30.0000 mg | Freq: Once | INTRAMUSCULAR | Status: DC

## 2014-03-11 MED ORDER — DIPHENHYDRAMINE HCL 50 MG/ML IJ SOLN
INTRAMUSCULAR | Status: AC
  Filled 2014-03-11: qty 1

## 2014-03-11 MED ORDER — FENTANYL CITRATE 0.05 MG/ML IJ SOLN
25.0000 ug | INTRAMUSCULAR | Status: DC | PRN
  Administered 2014-03-11 (×3): 50 ug via INTRAVENOUS

## 2014-03-11 MED ORDER — MEPERIDINE HCL 25 MG/ML IJ SOLN: 6.2500 mg | INTRAMUSCULAR | Status: DC | PRN

## 2014-03-11 MED ORDER — MIDAZOLAM HCL 2 MG/2ML IJ SOLN: 1.0000 mg | INTRAMUSCULAR | Status: DC | PRN

## 2014-03-11 MED ORDER — MIDAZOLAM HCL 5 MG/5ML IJ SOLN
INTRAMUSCULAR | Status: DC | PRN
  Administered 2014-03-11: 2 mg via INTRAVENOUS

## 2014-03-11 MED ORDER — HYDROMORPHONE HCL 1 MG/ML IJ SOLN
0.2500 mg | INTRAMUSCULAR | Status: DC | PRN
  Administered 2014-03-11: 0.5 mg via INTRAVENOUS

## 2014-03-11 MED ORDER — ESTRADIOL 0.1 MG/GM VA CREA
TOPICAL_CREAM | VAGINAL | Status: AC
  Filled 2014-03-11: qty 42.5

## 2014-03-11 MED ORDER — LACTATED RINGERS IV SOLN
INTRAVENOUS | Status: DC
  Administered 2014-03-11 (×3): via INTRAVENOUS
  Administered 2014-03-11: 125 mL/h via INTRAVENOUS

## 2014-03-11 MED ORDER — ONDANSETRON HCL 4 MG/2ML IJ SOLN
INTRAMUSCULAR | Status: AC
Start: 2014-03-11 — End: 2014-03-11
  Administered 2014-03-11: 4 mg via INTRAVENOUS
  Filled 2014-03-11: qty 2

## 2014-03-11 SURGICAL SUPPLY — 23 items
BLADE SURG 15 STRL LF C SS BP (BLADE) ×1 IMPLANT
BLADE SURG 15 STRL SS (BLADE) ×1
CANISTER SUCT 3000ML (MISCELLANEOUS) ×2 IMPLANT
CATH ROBINSON RED A/P 16FR (CATHETERS) ×2 IMPLANT
CLOTH BEACON ORANGE TIMEOUT ST (SAFETY) ×2 IMPLANT
DECANTER SPIKE VIAL GLASS SM (MISCELLANEOUS) ×2 IMPLANT
DRAPE HYSTEROSCOPY (DRAPE) ×2 IMPLANT
GAUZE PACKING 2X5 YD STRL (GAUZE/BANDAGES/DRESSINGS) IMPLANT
GLOVE BIO SURGEON STRL SZ8 (GLOVE) ×2 IMPLANT
GLOVE ORTHO TXT STRL SZ7.5 (GLOVE) ×2 IMPLANT
GOWN STRL REUS W/TWL LRG LVL3 (GOWN DISPOSABLE) ×4 IMPLANT
LIQUID BAND (GAUZE/BANDAGES/DRESSINGS) IMPLANT
NEEDLE HYPO 22GX1.5 SAFETY (NEEDLE) ×2 IMPLANT
NS IRRIG 1000ML POUR BTL (IV SOLUTION) ×2 IMPLANT
PACK VAGINAL WOMENS (CUSTOM PROCEDURE TRAY) ×2 IMPLANT
SET CYSTO W/LG BORE CLAMP LF (SET/KITS/TRAYS/PACK) ×2 IMPLANT
SLING SOLYX SYSTEM SIS BX (SLING) IMPLANT
SLING SOLYX SYSTEM SIS EA (Sling) ×2 IMPLANT
SUT VIC AB 2-0 CT1 (SUTURE) ×8 IMPLANT
SUT VIC AB 2-0 CT2 27 (SUTURE) IMPLANT
TOWEL OR 17X24 6PK STRL BLUE (TOWEL DISPOSABLE) ×4 IMPLANT
TRAY FOLEY CATH 14FR (SET/KITS/TRAYS/PACK) ×2 IMPLANT
WATER STERILE IRR 1000ML POUR (IV SOLUTION) ×2 IMPLANT

## 2014-03-11 NOTE — Discharge Instructions (Signed)
Routine instructions for sling procedure

## 2014-03-11 NOTE — Op Note (Signed)
Preoperative diagnosis: Stress urinary incontinence Postoperative diagnosis: Stress urinary incontinence Procedure: Solyx sling Surgeon: Lavina Hammanodd Shawndell Varas M.D. Anesthesia: Gen. With an LMA Findings: She had a hypermobile urethra. With cystoscopy there was no injury to the bladder or urethra. Estimated blood loss: 100 cc Complications: None  Procedure in detail:  Patient was taken to the operating room and placed in the dorsosupine position. General anesthesia was induced and she was placed in mobile stirrups. Perineum, vagina and lower abdomen were then prepped and draped in usual sterile fashion and bladder drained with a red Robinson catheter. Legs were elevated about halfway in the stirrups. The anterior vagina was grasped with Allis clamps proximally 1 1/2 fingerbreadths from the urethral meatus. Local anesthetic was infiltrated in the midline and bilaterally for hydrodissection. A 1 cm vertical incision was was then made in the vagina between the Allis clamps. The edges of the incision were then grasped with the Allis clamps. Metzenbaum scissors were used to sharply dissect beneath the vaginal mucosa to each pubic ramus. The Solyx sling was then first placed on the patient's right side and anchored behind the pubic bone.  A good placement was achieved on the right side. Placement was achieved on the left side in a similar fashion submucosal to just past the pubic ramus. A right angle clamp was able to just barely be passed between the sling and the urethral tissue confirming good tension. Cystoscopy was performed which revealed a normal bladder and no evidence of injury to the bladder or the urethra. 200 cc of fluid was used for the cystoscopy. The cystoscope was removed. A Cred maneuver was performed and no leakage was seen. The Solyx sling was released on the left side. The vaginal incision was then closed with running locking 2-0 Vicryl with adequate closure and adequate hemostasis. All instruments  were then removed from the vagina. The patient tolerated the procedure well and was taken to the recovery room in stable condition. Counts were correct, she received Ancef 2 gm prior to the procedure, she had PAS hose on throughout the procedure.

## 2014-03-11 NOTE — Interval H&P Note (Signed)
History and Physical Interval Note:  03/11/2014 7:03 AM  Angie Brown  has presented today for surgery, with the diagnosis of SUI, [phone]  The various methods of treatment have been discussed with the patient and family. After consideration of risks, benefits and other options for treatment, the patient has consented to  Procedure(s): TRANSVAGINAL TAPE (TVT) PROCEDURE (N/A) as a surgical intervention .  The patient's history has been reviewed, patient examined, no change in status, stable for surgery.  I have reviewed the patient's chart and labs.  Questions were answered to the patient's satisfaction.     Chelcea Zahn D

## 2014-03-11 NOTE — Transfer of Care (Signed)
Immediate Anesthesia Transfer of Care Note  Patient: Angie Brown  Procedure(s) Performed: Procedure(s): TRANSVAGINAL TAPE (TVT) PROCEDURE (N/A)  Patient Location: PACU  Anesthesia Type:General  Level of Consciousness: awake, alert  and oriented  Airway & Oxygen Therapy: Patient Spontanous Breathing and Patient connected to nasal cannula oxygen  Post-op Assessment: Report given to PACU RN and Post -op Vital signs reviewed and stable  Post vital signs: Reviewed and stable  Complications: No apparent anesthesia complications

## 2014-03-11 NOTE — Anesthesia Preprocedure Evaluation (Signed)
Anesthesia Evaluation  Patient identified by MRN, date of birth, ID band Patient awake    Reviewed: Allergy & Precautions, H&P , NPO status , Patient's Chart, lab work & pertinent test results  Airway Mallampati: I  TM Distance: >3 FB Neck ROM: full    Dental no notable dental hx. (+) Teeth Intact   Pulmonary Current Smoker,    Pulmonary exam normal       Cardiovascular negative cardio ROS      Neuro/Psych    GI/Hepatic negative GI ROS, Neg liver ROS,   Endo/Other  negative endocrine ROS  Renal/GU      Musculoskeletal   Abdominal Normal abdominal exam  (+)   Peds  Hematology negative hematology ROS (+)   Anesthesia Other Findings   Reproductive/Obstetrics negative OB ROS                             Anesthesia Physical Anesthesia Plan  ASA: II  Anesthesia Plan: General   Post-op Pain Management:    Induction: Intravenous  Airway Management Planned: LMA  Additional Equipment:   Intra-op Plan:   Post-operative Plan:   Informed Consent: I have reviewed the patients History and Physical, chart, labs and discussed the procedure including the risks, benefits and alternatives for the proposed anesthesia with the patient or authorized representative who has indicated his/her understanding and acceptance.     Plan Discussed with: CRNA, Surgeon and Anesthesiologist  Anesthesia Plan Comments:         Anesthesia Quick Evaluation

## 2014-03-11 NOTE — Progress Notes (Signed)
CTSP for pain left pelvis On exam, she is tender where sling is going into obturator muscle on her left Sling may be near a nerve, should improve with time

## 2014-03-11 NOTE — Anesthesia Postprocedure Evaluation (Signed)
  Anesthesia Post Note  Patient: Angie Brown  Procedure(s) Performed: Procedure(s) (LRB): TRANSVAGINAL TAPE (TVT) PROCEDURE (N/A)  Anesthesia type: GA  Patient location: PACU  Post pain: Pain level controlled  Post assessment: Post-op Vital signs reviewed  Last Vitals:  Filed Vitals:   03/11/14 0808  BP: 134/85  Pulse: 74  Temp: 36.7 C  Resp: 16    Post vital signs: Reviewed  Level of consciousness: sedated  Complications: No apparent anesthesia complications

## 2014-03-13 ENCOUNTER — Encounter (HOSPITAL_COMMUNITY): Payer: Self-pay | Admitting: Obstetrics and Gynecology

## 2014-07-27 ENCOUNTER — Emergency Department (HOSPITAL_COMMUNITY)
Admission: EM | Admit: 2014-07-27 | Discharge: 2014-07-27 | Disposition: A | Discharge status: Home / Self Care | Payer: Medicaid Other

## 2014-11-02 DIAGNOSIS — F411 Generalized anxiety disorder: Secondary | ICD-10-CM | POA: Insufficient documentation

## 2015-03-02 ENCOUNTER — Encounter (HOSPITAL_COMMUNITY): Payer: Self-pay

## 2015-03-02 ENCOUNTER — Emergency Department (HOSPITAL_COMMUNITY)
Admission: EM | Admit: 2015-03-02 | Discharge: 2015-03-03 | Disposition: A | Discharge status: Home / Self Care | Payer: Medicaid Other | Attending: Emergency Medicine | Admitting: Emergency Medicine

## 2015-03-02 DIAGNOSIS — Z79899 Other long term (current) drug therapy: Secondary | ICD-10-CM | POA: Diagnosis not present

## 2015-03-02 DIAGNOSIS — R112 Nausea with vomiting, unspecified: Secondary | ICD-10-CM | POA: Insufficient documentation

## 2015-03-02 DIAGNOSIS — R1031 Right lower quadrant pain: Secondary | ICD-10-CM | POA: Diagnosis present

## 2015-03-02 DIAGNOSIS — Z3202 Encounter for pregnancy test, result negative: Secondary | ICD-10-CM | POA: Insufficient documentation

## 2015-03-02 DIAGNOSIS — Z8679 Personal history of other diseases of the circulatory system: Secondary | ICD-10-CM | POA: Insufficient documentation

## 2015-03-02 DIAGNOSIS — Z8639 Personal history of other endocrine, nutritional and metabolic disease: Secondary | ICD-10-CM | POA: Insufficient documentation

## 2015-03-02 DIAGNOSIS — Z9049 Acquired absence of other specified parts of digestive tract: Secondary | ICD-10-CM | POA: Diagnosis not present

## 2015-03-02 DIAGNOSIS — Z791 Long term (current) use of non-steroidal anti-inflammatories (NSAID): Secondary | ICD-10-CM | POA: Diagnosis not present

## 2015-03-02 DIAGNOSIS — Z90710 Acquired absence of both cervix and uterus: Secondary | ICD-10-CM | POA: Diagnosis not present

## 2015-03-02 DIAGNOSIS — F17210 Nicotine dependence, cigarettes, uncomplicated: Secondary | ICD-10-CM | POA: Diagnosis not present

## 2015-03-02 DIAGNOSIS — J45909 Unspecified asthma, uncomplicated: Secondary | ICD-10-CM | POA: Insufficient documentation

## 2015-03-02 DIAGNOSIS — R630 Anorexia: Secondary | ICD-10-CM | POA: Diagnosis not present

## 2015-03-02 LAB — CBC
HCT: 44.6 % (ref 36.0–46.0)
Hemoglobin: 15.4 g/dL — ABNORMAL HIGH (ref 12.0–15.0)
MCH: 32.6 pg (ref 26.0–34.0)
MCHC: 34.5 g/dL (ref 30.0–36.0)
MCV: 94.5 fL (ref 78.0–100.0)
PLATELETS: 289 10*3/uL (ref 150–400)
RBC: 4.72 MIL/uL (ref 3.87–5.11)
RDW: 12.4 % (ref 11.5–15.5)
WBC: 13.3 10*3/uL — AB (ref 4.0–10.5)

## 2015-03-02 LAB — URINALYSIS, ROUTINE W REFLEX MICROSCOPIC
BILIRUBIN URINE: NEGATIVE
Glucose, UA: NEGATIVE mg/dL
Hgb urine dipstick: NEGATIVE
KETONES UR: NEGATIVE mg/dL
LEUKOCYTES UA: NEGATIVE
NITRITE: NEGATIVE
PROTEIN: NEGATIVE mg/dL
Specific Gravity, Urine: 1.011 (ref 1.005–1.030)
pH: 7.5 (ref 5.0–8.0)

## 2015-03-02 LAB — COMPREHENSIVE METABOLIC PANEL
ALK PHOS: 80 U/L (ref 38–126)
ALT: 37 U/L (ref 14–54)
AST: 36 U/L (ref 15–41)
Albumin: 4 g/dL (ref 3.5–5.0)
Anion gap: 10 (ref 5–15)
BILIRUBIN TOTAL: 0.5 mg/dL (ref 0.3–1.2)
CALCIUM: 9.3 mg/dL (ref 8.9–10.3)
CO2: 28 mmol/L (ref 22–32)
CREATININE: 0.77 mg/dL (ref 0.44–1.00)
Chloride: 99 mmol/L — ABNORMAL LOW (ref 101–111)
GFR calc Af Amer: >60 mL/min (ref >60)
Glucose, Bld: 121 mg/dL — ABNORMAL HIGH (ref 65–99)
Potassium: 4 mmol/L (ref 3.5–5.1)
Sodium: 137 mmol/L (ref 135–145)
TOTAL PROTEIN: 7.2 g/dL (ref 6.5–8.1)

## 2015-03-02 LAB — I-STAT BETA HCG BLOOD, ED (MC, WL, AP ONLY): I-stat hCG, quantitative: <5 m[IU]/mL (ref <5)

## 2015-03-02 LAB — LIPASE, BLOOD: Lipase: 31 U/L (ref 11–51)

## 2015-03-02 MED ORDER — ONDANSETRON 4 MG PO TBDP
4.0000 mg | ORAL_TABLET | Freq: Once | ORAL | Status: AC | PRN
  Administered 2015-03-02: 4 mg via ORAL

## 2015-03-02 MED ORDER — SODIUM CHLORIDE 0.9 % IV BOLUS (SEPSIS)
1000.0000 mL | Freq: Once | INTRAVENOUS | Status: AC
  Administered 2015-03-02: 1000 mL via INTRAVENOUS

## 2015-03-02 MED ORDER — ONDANSETRON 4 MG PO TBDP
ORAL_TABLET | ORAL | Status: AC
  Filled 2015-03-02: qty 1

## 2015-03-02 MED ORDER — MORPHINE SULFATE (PF) 4 MG/ML IV SOLN
4.0000 mg | Freq: Once | INTRAVENOUS | Status: AC
  Administered 2015-03-02: 4 mg via INTRAVENOUS
  Filled 2015-03-02: qty 1

## 2015-03-02 MED ORDER — ONDANSETRON HCL 4 MG/2ML IJ SOLN
4.0000 mg | Freq: Once | INTRAMUSCULAR | Status: AC
  Administered 2015-03-02: 4 mg via INTRAVENOUS
  Filled 2015-03-02: qty 2

## 2015-03-02 NOTE — ED Notes (Signed)
Pt here with RLQ abdominal pain onset Saturday associated with vomiting. Emesis x 2 today. Pt reports loss of appetite as well, last ate Friday.

## 2015-03-02 NOTE — ED Provider Notes (Signed)
CSN: [id number]     Arrival date & time 03/02/15  2058 History   First MD Initiated Contact with Patient 03/02/15 2254     Chief Complaint  Patient presents with  . Abdominal Pain     (Consider location/radiation/quality/duration/timing/severity/associated sxs/prior Treatment) HPI Comments: Patient with history of asthma presents with nausea and vomiting x 3 days associated with pain across mid-abdomen. She states the pain has settled in the RLQ predominantly. No fever, diarrhea, hematemesis. She has been unable to hold anything on her stomach today. She also reports anorexia x 3 days as well. No sick contacts.   Patient is a 34 y.o. female presenting with abdominal pain. The history is provided by the patient. No language interpreter was used.  Abdominal Pain Pain location:  RLQ Pain radiates to:  Does not radiate Pain severity:  Moderate Associated symptoms: nausea and vomiting   Associated symptoms: no chills, no dysuria, no fever and no vaginal discharge     Past Medical History  Diagnosis Date  . Asthma   . Bronchitis   . Hypoglycemia   . PONV (postoperative nausea and vomiting)     pt prefers scop patch  . Dysrhythmia     PVCs- stress related   Past Surgical History  Procedure Laterality Date  . Cholecystectomy    . Tonsillectomy    . Abdominal hysterectomy    . Back surgery  07/2009    spinal fusion L 5-S1  . Bladder suspension N/A 03/11/2014    Procedure: TRANSVAGINAL TAPE (TVT) PROCEDURE;  Surgeon: Lavina Hamman, MD;  Location: [facility];  Service: Gynecology;  Laterality: N/A;   No family history on file. Social History  Substance Use Topics  . Smoking status: Current Every Day Smoker -- 0.50 packs/day    Types: Cigarettes  . Smokeless tobacco: Never Used  . Alcohol Use: Yes     Comment: social   OB History    No data available     Review of Systems  Constitutional: Positive for appetite change. Negative for fever and chills.  HENT: Negative.    Respiratory: Negative.   Cardiovascular: Negative.   Gastrointestinal: Positive for nausea, vomiting and abdominal pain.  Genitourinary: Negative.  Negative for dysuria, vaginal discharge and pelvic pain.  Musculoskeletal: Negative.   Skin: Negative.   Neurological: Negative.       Allergies  Loratadine; Doxycycline; Ibuprofen; and Sumatriptan  Home Medications   Prior to Admission medications   Medication Sig Start Date End Date Taking? Authorizing Provider  albuterol (PROVENTIL) (2.5 MG/3ML) 0.083% nebulizer solution Take 3 mLs (2.5 mg total) by nebulization every 6 (six) hours as needed for wheezing or shortness of breath. 05/01/13   Rodolph Bong, MD  baclofen (LIORESAL) 20 MG tablet Take 20 mg by mouth 4 (four) times daily.    Historical Provider, MD  HYDROcodone-acetaminophen (NORCO) 5-325 MG per tablet Take 1-2 tablets by mouth every 6 (six) hours as needed for moderate pain. 03/11/14   Lavina Hamman, MD  ipratropium (ATROVENT) 0.06 % nasal spray Place 2 sprays into both nostrils 4 (four) times daily. Patient not taking: Reported on 03/03/2014 04/29/13   Rodolph Bong, MD  naproxen sodium (ANAPROX DS) 550 MG tablet Take 1 tablet (550 mg total) by mouth 2 (two) times daily with a meal. 03/11/14   Lavina Hamman, MD  oxyCODONE-acetaminophen (PERCOCET) 10-325 MG per tablet Take 1-2 tablets by mouth every 6 (six) hours as needed for pain.    Historical Provider, MD  oxyCODONE-acetaminophen (  PERCOCET) 5-325 MG per tablet Take 1 tablet by mouth every 6 (six) hours as needed. Patient not taking: Reported on 03/03/2014 05/20/13   Toy CookeyMegan Docherty, MD  Pseudoeph-Doxylamine-DM-APAP (NYQUIL PO) Take 1 capsule by mouth at bedtime as needed.    Historical Provider, MD  rizatriptan (MAXALT) 5 MG tablet Take 5 mg by mouth as needed for migraine. May repeat in 2 hours if needed    Historical Provider, MD   BP 154/110 mmHg  Pulse 66  Temp(Src) 98.7 F (37.1 C) (Oral)  Resp 16  Ht 5\' 7"  (1.702 m)   Wt 92.987 kg  BMI 32.10 kg/m2  SpO2 100% Physical Exam  Constitutional: She is oriented to person, place, and time. She appears well-developed and well-nourished.  HENT:  Head: Normocephalic.  Neck: Normal range of motion. Neck supple.  Cardiovascular: Normal rate and regular rhythm.   Pulmonary/Chest: Effort normal and breath sounds normal.  Abdominal: Soft. Bowel sounds are normal. There is tenderness. There is no rebound and no guarding.  Markedly tender RLQ with guarding. No other specific abdominal tenderness.   Musculoskeletal: Normal range of motion.  Neurological: She is alert and oriented to person, place, and time.  Skin: Skin is warm and dry. No rash noted.  Psychiatric: She has a normal mood and affect.    ED Course  Procedures (including critical care time) Labs Review Labs Reviewed  COMPREHENSIVE METABOLIC PANEL - Abnormal; Notable for the following:    Chloride 99 (*)    Glucose, Bld 121 (*)    BUN <5 (*)    All other components within normal limits  CBC - Abnormal; Notable for the following:    WBC 13.3 (*)    Hemoglobin 15.4 (*)    All other components within normal limits  LIPASE, BLOOD  URINALYSIS, ROUTINE W REFLEX MICROSCOPIC (NOT AT [facility])  I-STAT BETA HCG BLOOD, ED ([facility], [facility], [facility] ONLY)    Imaging Review No results found. I have personally reviewed and evaluated these images and lab results as part of my medical decision-making.   EKG Interpretation None      MDM   Final diagnoses:  None    1. Abdominal pain  Patient's presentation concerning for appendicitis, however, CT abd/pel negative for appy and for any acute process. Pain is improved with medications. No further vomiting in emergency department. She is felt safe for discharge home. Return precautions discussed.     Elpidio AnisShari Debroh Sieloff, PA-C 03/03/15 0410  Blane OharaJoshua Zavitz, MD 03/06/15 [phone]

## 2015-03-03 ENCOUNTER — Emergency Department (HOSPITAL_COMMUNITY): Payer: Medicaid Other

## 2015-03-03 MED ORDER — HYDROMORPHONE HCL 1 MG/ML IJ SOLN
INTRAMUSCULAR | Status: AC
  Filled 2015-03-03: qty 1

## 2015-03-03 MED ORDER — IOHEXOL 300 MG/ML  SOLN
100.0000 mL | Freq: Once | INTRAMUSCULAR | Status: AC | PRN
  Administered 2015-03-03: 100 mL via INTRAVENOUS

## 2015-03-03 MED ORDER — PROMETHAZINE HCL 25 MG RE SUPP: 25.0000 mg | Freq: Four times a day (QID) | RECTAL | Status: DC | PRN

## 2015-03-03 MED ORDER — OXYCODONE-ACETAMINOPHEN 5-325 MG PO TABS: 1.0000 | ORAL_TABLET | ORAL | Status: DC | PRN

## 2015-03-03 NOTE — Discharge Instructions (Signed)

## 2015-03-05 ENCOUNTER — Encounter (HOSPITAL_COMMUNITY): Payer: Self-pay | Admitting: *Deleted

## 2015-03-05 ENCOUNTER — Emergency Department (HOSPITAL_COMMUNITY)
Admission: EM | Admit: 2015-03-05 | Discharge: 2015-03-05 | Disposition: A | Discharge status: Home / Self Care | Payer: Medicaid Other | Attending: Emergency Medicine | Admitting: Emergency Medicine

## 2015-03-05 ENCOUNTER — Encounter (HOSPITAL_COMMUNITY): Payer: Self-pay | Admitting: Emergency Medicine

## 2015-03-05 ENCOUNTER — Emergency Department (INDEPENDENT_AMBULATORY_CARE_PROVIDER_SITE_OTHER)
Admission: EM | Admit: 2015-03-05 | Discharge: 2015-03-05 | Disposition: A | Discharge status: Home / Self Care | Payer: Medicaid Other | Source: Home / Self Care | Attending: Family Medicine | Admitting: Family Medicine

## 2015-03-05 DIAGNOSIS — R109 Unspecified abdominal pain: Secondary | ICD-10-CM | POA: Diagnosis present

## 2015-03-05 DIAGNOSIS — Z9049 Acquired absence of other specified parts of digestive tract: Secondary | ICD-10-CM | POA: Insufficient documentation

## 2015-03-05 DIAGNOSIS — Z8679 Personal history of other diseases of the circulatory system: Secondary | ICD-10-CM | POA: Diagnosis not present

## 2015-03-05 DIAGNOSIS — R10826 Epigastric rebound abdominal tenderness: Secondary | ICD-10-CM

## 2015-03-05 DIAGNOSIS — J45909 Unspecified asthma, uncomplicated: Secondary | ICD-10-CM | POA: Insufficient documentation

## 2015-03-05 DIAGNOSIS — R5381 Other malaise: Secondary | ICD-10-CM | POA: Insufficient documentation

## 2015-03-05 DIAGNOSIS — R112 Nausea with vomiting, unspecified: Secondary | ICD-10-CM

## 2015-03-05 DIAGNOSIS — F17210 Nicotine dependence, cigarettes, uncomplicated: Secondary | ICD-10-CM | POA: Diagnosis not present

## 2015-03-05 DIAGNOSIS — E860 Dehydration: Secondary | ICD-10-CM

## 2015-03-05 DIAGNOSIS — R10819 Abdominal tenderness, unspecified site: Secondary | ICD-10-CM

## 2015-03-05 DIAGNOSIS — Z8639 Personal history of other endocrine, nutritional and metabolic disease: Secondary | ICD-10-CM | POA: Diagnosis not present

## 2015-03-05 DIAGNOSIS — R10823 Right lower quadrant rebound abdominal tenderness: Secondary | ICD-10-CM | POA: Diagnosis not present

## 2015-03-05 DIAGNOSIS — Z79899 Other long term (current) drug therapy: Secondary | ICD-10-CM | POA: Insufficient documentation

## 2015-03-05 DIAGNOSIS — R630 Anorexia: Secondary | ICD-10-CM | POA: Diagnosis not present

## 2015-03-05 DIAGNOSIS — R197 Diarrhea, unspecified: Secondary | ICD-10-CM | POA: Insufficient documentation

## 2015-03-05 DIAGNOSIS — R1084 Generalized abdominal pain: Secondary | ICD-10-CM

## 2015-03-05 DIAGNOSIS — Z90710 Acquired absence of both cervix and uterus: Secondary | ICD-10-CM | POA: Diagnosis not present

## 2015-03-05 LAB — COMPREHENSIVE METABOLIC PANEL
ALBUMIN: 4.2 g/dL (ref 3.5–5.0)
ALT: 51 U/L (ref 14–54)
ANION GAP: 9 (ref 5–15)
AST: 46 U/L — AB (ref 15–41)
Alkaline Phosphatase: 80 U/L (ref 38–126)
BUN: 6 mg/dL (ref 6–20)
CHLORIDE: 100 mmol/L — AB (ref 101–111)
CO2: 28 mmol/L (ref 22–32)
Calcium: 9.6 mg/dL (ref 8.9–10.3)
Creatinine, Ser: 0.82 mg/dL (ref 0.44–1.00)
GFR calc Af Amer: >60 mL/min (ref >60)
GFR calc non Af Amer: >60 mL/min (ref >60)
GLUCOSE: 109 mg/dL — AB (ref 65–99)
POTASSIUM: 4.2 mmol/L (ref 3.5–5.1)
SODIUM: 137 mmol/L (ref 135–145)
Total Bilirubin: 0.8 mg/dL (ref 0.3–1.2)
Total Protein: 7.5 g/dL (ref 6.5–8.1)

## 2015-03-05 LAB — LIPASE, BLOOD: LIPASE: 33 U/L (ref 11–51)

## 2015-03-05 LAB — URINALYSIS, ROUTINE W REFLEX MICROSCOPIC
GLUCOSE, UA: NEGATIVE mg/dL
Hgb urine dipstick: NEGATIVE
KETONES UR: 15 mg/dL — AB
LEUKOCYTES UA: NEGATIVE
Nitrite: NEGATIVE
PH: 5.5 (ref 5.0–8.0)
Protein, ur: NEGATIVE mg/dL
SPECIFIC GRAVITY, URINE: 1.026 (ref 1.005–1.030)

## 2015-03-05 LAB — CBC
HEMATOCRIT: 45.2 % (ref 36.0–46.0)
HEMOGLOBIN: 15.6 g/dL — AB (ref 12.0–15.0)
MCH: 32.6 pg (ref 26.0–34.0)
MCHC: 34.5 g/dL (ref 30.0–36.0)
MCV: 94.6 fL (ref 78.0–100.0)
Platelets: 279 10*3/uL (ref 150–400)
RBC: 4.78 MIL/uL (ref 3.87–5.11)
RDW: 12.6 % (ref 11.5–15.5)
WBC: 10.6 10*3/uL — ABNORMAL HIGH (ref 4.0–10.5)

## 2015-03-05 MED ORDER — MORPHINE SULFATE (PF) 4 MG/ML IV SOLN
4.0000 mg | Freq: Once | INTRAVENOUS | Status: AC
  Administered 2015-03-05: 4 mg via INTRAVENOUS
  Filled 2015-03-05: qty 1

## 2015-03-05 MED ORDER — ONDANSETRON HCL 4 MG/2ML IJ SOLN
INTRAMUSCULAR | Status: AC
  Filled 2015-03-05: qty 2

## 2015-03-05 MED ORDER — SODIUM CHLORIDE 0.9 % IV BOLUS (SEPSIS)
1000.0000 mL | Freq: Once | INTRAVENOUS | Status: AC
  Administered 2015-03-05: 1000 mL via INTRAVENOUS

## 2015-03-05 MED ORDER — ONDANSETRON HCL 4 MG/2ML IJ SOLN
4.0000 mg | Freq: Once | INTRAMUSCULAR | Status: AC
  Administered 2015-03-05: 4 mg via INTRAVENOUS
  Filled 2015-03-05: qty 2

## 2015-03-05 MED ORDER — PROMETHAZINE HCL 25 MG PO TABS: 25.0000 mg | ORAL_TABLET | Freq: Four times a day (QID) | ORAL | Status: DC | PRN

## 2015-03-05 MED ORDER — ONDANSETRON HCL 4 MG/2ML IJ SOLN
4.0000 mg | Freq: Once | INTRAMUSCULAR | Status: AC
  Administered 2015-03-05: 4 mg via INTRAMUSCULAR

## 2015-03-05 MED ORDER — PROMETHAZINE HCL 25 MG/ML IJ SOLN
12.5000 mg | Freq: Once | INTRAMUSCULAR | Status: AC
  Administered 2015-03-05: 12.5 mg via INTRAVENOUS
  Filled 2015-03-05: qty 1

## 2015-03-05 NOTE — ED Notes (Signed)
Waiting on shuttle.

## 2015-03-05 NOTE — Discharge Instructions (Signed)

## 2015-03-05 NOTE — ED Notes (Signed)
C/o persistent emesis and diarrhea; [facility] for similar sx abd pain and emesis has subsided.... Denies diarrhea Was given Phenergan Suppository A&O x4... No acute distress.

## 2015-03-05 NOTE — ED Provider Notes (Signed)
CSN: [id number]     Arrival date & time 03/05/15  1709 History  By signing my name below, I, Elon SpannerGarrett Cook, attest that this documentation has been prepared under the direction and in the presence of Fayrene HelperBowie Pasqualino Witherspoon, PA-C. Electronically Signed: Elon SpannerGarrett Cook ED Scribe. 03/05/2015. 7:46 PM.    Chief Complaint  Patient presents with  . Abdominal Pain    The history is provided by the patient and medical records. No language interpreter was used.   HPI Comments: Angie Brown is a 34 y.[facility]. [facility] who presents to the Emergency Department complaining of abdominal pain onset 1 week ago.  She also notes 1 week of nausea, vomiting (normal stomach contents; 8 episodes yesterday), and improving diarrhea (8 episodes yesterday; 3 today), malaise, fatigue, and decreased appetite. The [facility] and I didn't have any abdominal pain until the doctor started pushing on me."  Per [facility] note, she was referred to ED due to the severity of her current abdominal pain.    Patient was evaluated 03/02/15 in the ED where a normal abdominal CT was performed.  The patient has a hx of cholecystectomy and hysterectomy.  She denies rhinorrhea, sneezing cough, fever, chills, dysuria, hematuria.   Past Medical History  Diagnosis Date  . Asthma   . Bronchitis   . Hypoglycemia   . PONV (postoperative nausea and vomiting)     pt prefers scop patch  . Dysrhythmia     PVCs- stress related   Past Surgical History  Procedure Laterality Date  . Cholecystectomy    . Tonsillectomy    . Abdominal hysterectomy    . Back surgery  07/2009    spinal fusion L 5-S1  . Bladder suspension N/A 03/11/2014    Procedure: TRANSVAGINAL TAPE (TVT) PROCEDURE;  Surgeon: Lavina Hammanodd Meisinger, MD;  Location: [facility];  Service: Gynecology;  Laterality: N/A;   No family history on file. Social History  Substance Use Topics  . Smoking status: Current Every Day Smoker -- 0.50 packs/day   Types: Cigarettes  . Smokeless tobacco: Never Used  . Alcohol Use: Yes     Comment: social   OB History    No data available     Review of Systems  Constitutional: Negative for fever and chills.  HENT: Negative for sneezing.   Respiratory: Negative for cough.   Genitourinary: Negative for dysuria and hematuria.      Allergies  Loratadine; Doxycycline; Ibuprofen; and Sumatriptan  Home Medications   Prior to Admission medications   Medication Sig Start Date End Date Taking? Authorizing Provider  albuterol (PROVENTIL HFA;VENTOLIN HFA) 108 (90 BASE) MCG/ACT inhaler Inhale 2 puffs into the lungs every 6 (six) hours as needed for wheezing or shortness of breath.    Historical Provider, MD  albuterol (PROVENTIL) (2.5 MG/3ML) 0.083% nebulizer solution Take 3 mLs (2.5 mg total) by nebulization every 6 (six) hours as needed for wheezing or shortness of breath. 05/01/13   Rodolph BongEvan S Corey, MD  cyclobenzaprine (FLEXERIL) 10 MG tablet Take 10 mg by mouth at bedtime as needed for muscle spasms.    Historical Provider, MD  Melatonin 10 MG TABS Take 10 mg by mouth at bedtime.    Historical Provider, MD  oxyCODONE-acetaminophen (PERCOCET/ROXICET) 5-325 MG tablet Take 1-2 tablets by mouth every 4 (four) hours as needed for severe pain. 03/03/15   Elpidio AnisShari Upstill, PA-C  promethazine (PHENERGAN) 25 MG suppository Place 1 suppository (  25 mg total) rectally every 6 (six) hours as needed for nausea or vomiting. 03/03/15   Elpidio Anis, PA-C  rizatriptan (MAXALT) 5 MG tablet Take 5 mg by mouth as needed for migraine. May repeat in 2 hours if needed    Historical Provider, MD   BP 141/99 mmHg  Pulse 103  Temp(Src) 98.2 F (36.8 C) (Oral)  Resp 16  SpO2 100% Physical Exam  Constitutional: She is oriented to person, place, and time. She appears well-developed and well-nourished. No distress.  HENT:  Head: Normocephalic and atraumatic.  Eyes: Conjunctivae and EOM are normal.  Neck: Neck supple. No tracheal  deviation present.  Cardiovascular: Normal rate.   Pulmonary/Chest: Effort normal. No respiratory distress.  Abdominal:  Tenderness to epigastrium with no guarding or rebound on palpation.  No pain at McBurney's point  Musculoskeletal: Normal range of motion.  Neurological: She is alert and oriented to person, place, and time.  Skin: Skin is warm and dry.  Psychiatric: She has a normal mood and affect. Her behavior is normal.  Nursing note and vitals reviewed.   ED Course  Procedures (including critical care time)  DIAGNOSTIC STUDIES: Oxygen Saturation is 100% on RA, normal by my interpretation.    COORDINATION OF CARE:  7:54 PM Discussed advanced imaging options with patient who agreed repeat CT scan was not indicated at this time.  Will order anti-emetic, pain medication, and IV fluids.  Patient acknowledges and agrees with plan.    Labs Review Labs Reviewed  COMPREHENSIVE METABOLIC PANEL - Abnormal; Notable for the following:    Chloride 100 (*)    Glucose, Bld 109 (*)    AST 46 (*)    All other components within normal limits  CBC - Abnormal; Notable for the following:    WBC 10.6 (*)    Hemoglobin 15.6 (*)    All other components within normal limits  URINALYSIS, ROUTINE W REFLEX MICROSCOPIC (NOT AT [facility]) - Abnormal; Notable for the following:    Color, Urine AMBER (*)    Bilirubin Urine SMALL (*)    Ketones, ur 15 (*)    All other components within normal limits  LIPASE, BLOOD    MDM   Final diagnoses:  Generalized abdominal pain  Nausea vomiting and diarrhea    BP 136/99 mmHg  Pulse 74  Temp(Src) 98.9 F (37.2 C) (Oral)  Resp 16  SpO2 100%   I personally performed the services described in this documentation, which was scribed in my presence. The recorded information has been reviewed and is accurate.     9:09 PM Pt here with c/o dehydration from n/v/d and abd pain.  She has had an abd/pelvis CT scan 2 days ago for same, no evidence of acute  pathology such as appendicitis at that time. Today she has mild upper abd pain, but labs are reassuring.  Her VSS.  i have low suspicion that pt has appendicitis or other acute emergent medical condition at this time. Suspect viral GI as several other family members has the same.  Pt did received sxs treatment with IVF, morphine, and zofran.    Fayrene Helper, PA-C 03/05/15 2238  Benjiman Core, MD 03/05/15 [phone]

## 2015-03-05 NOTE — ED Notes (Signed)
Pt reports abdominal pain and tenderness for 1 week with N/V/D. [facility]

## 2015-03-05 NOTE — ED Provider Notes (Signed)
CSN: [id number]     Arrival date & time 03/05/15  1510 History   First MD Initiated Contact with Patient 03/05/15 1542     Chief Complaint  Patient presents with  . Emesis  . Diarrhea   (Consider location/radiation/quality/duration/timing/severity/associated sxs/prior Treatment) HPI Comments: 34 year old female presents to the urgent care with a history of nausea vomiting and diarrhea for one week. She is complaining of a headache, bilateral eye pain, fatigue, malaise, weakness. She states she is having diarrhea 3 times today and yesterday a times. This is in addition to the previous days of diarrhea and vomiting. Her last episode of vomiting was one and a half days ago. Denies abdominal pain. Has a history of hysterectomy and cholecystectomy.   Past Medical History  Diagnosis Date  . Asthma   . Bronchitis   . Hypoglycemia   . PONV (postoperative nausea and vomiting)     pt prefers scop patch  . Dysrhythmia     PVCs- stress related   Past Surgical History  Procedure Laterality Date  . Cholecystectomy    . Tonsillectomy    . Abdominal hysterectomy    . Back surgery  07/2009    spinal fusion L 5-S1  . Bladder suspension N/A 03/11/2014    Procedure: TRANSVAGINAL TAPE (TVT) PROCEDURE;  Surgeon: Lavina Hammanodd Meisinger, MD;  Location: [facility];  Service: Gynecology;  Laterality: N/A;   No family history on file. Social History  Substance Use Topics  . Smoking status: Current Every Day Smoker -- 0.50 packs/day    Types: Cigarettes  . Smokeless tobacco: Never Used  . Alcohol Use: Yes     Comment: social   OB History    No data available     Review of Systems  Constitutional: Positive for chills, activity change, appetite change and fatigue. Negative for fever.  Respiratory: Negative.  Negative for cough and shortness of breath.   Cardiovascular: Negative for chest pain.  Gastrointestinal: Positive for nausea, vomiting and diarrhea. Negative for abdominal pain, blood in stool and  abdominal distention.  Genitourinary: Negative.   Musculoskeletal: Positive for myalgias.  Skin: Positive for pallor.  Neurological: Positive for headaches. Negative for syncope and speech difficulty.    Allergies  Loratadine; Doxycycline; Ibuprofen; and Sumatriptan  Home Medications   Prior to Admission medications   Medication Sig Start Date End Date Taking? Authorizing Provider  albuterol (PROVENTIL HFA;VENTOLIN HFA) 108 (90 BASE) MCG/ACT inhaler Inhale 2 puffs into the lungs every 6 (six) hours as needed for wheezing or shortness of breath.    Historical Provider, MD  albuterol (PROVENTIL) (2.5 MG/3ML) 0.083% nebulizer solution Take 3 mLs (2.5 mg total) by nebulization every 6 (six) hours as needed for wheezing or shortness of breath. 05/01/13   Rodolph BongEvan S Corey, MD  cyclobenzaprine (FLEXERIL) 10 MG tablet Take 10 mg by mouth at bedtime as needed for muscle spasms.    Historical Provider, MD  Melatonin 10 MG TABS Take 10 mg by mouth at bedtime.    Historical Provider, MD  oxyCODONE-acetaminophen (PERCOCET/ROXICET) 5-325 MG tablet Take 1-2 tablets by mouth every 4 (four) hours as needed for severe pain. 03/03/15   Elpidio AnisShari Upstill, PA-C  promethazine (PHENERGAN) 25 MG suppository Place 1 suppository (25 mg total) rectally every 6 (six) hours as needed for nausea or vomiting. 03/03/15   Elpidio AnisShari Upstill, PA-C  rizatriptan (MAXALT) 5 MG tablet Take 5 mg by mouth as needed for migraine. May repeat in 2 hours if needed    Historical Provider, MD  Meds Ordered and Administered this Visit   Medications  ondansetron (ZOFRAN) injection 4 mg (not administered)    BP 120/90 mmHg  Temp(Src) 98.1 F (36.7 C)  Resp 16  SpO2 100% Orthostatic VS for the past 24 hrs:  BP- Lying Pulse- Lying Pulse- Sitting Pulse- Standing at 0 minutes  03/05/15 1555 120/90 mmHg 104 107 125    Physical Exam  Constitutional: She is oriented to person, place, and time. She appears well-developed and well-nourished. No  distress.  Neck: Normal range of motion. Neck supple.  Cardiovascular: Intact distal pulses.   Apical tachycardia. S1-S2. No gallop, no murmur.  Pulmonary/Chest: Effort normal and breath sounds normal. No respiratory distress. She has no wheezes. She has no rales.  Abdominal: Soft. Bowel sounds are normal. She exhibits no distension.  Severe tenderness to the epigastrium and right hemiabdomen. Patient rates this as 9 out of 10. Difficult to examine the abdomen as she is resistant to the exam due to severe pain. No rebound. Positive guarding.  Musculoskeletal: She exhibits no edema.  Neurological: She is alert and oriented to person, place, and time. She exhibits normal muscle tone. Coordination normal.  Skin: Skin is warm and dry. There is pallor.  Nursing note and vitals reviewed.   ED Course  Procedures (including critical care time)  Labs Review Labs Reviewed - No data to display  Imaging Review No results found.   Visual Acuity Review  Right Eye Distance:   Left Eye Distance:   Bilateral Distance:    Right Eye Near:   Left Eye Near:    Bilateral Near:         MDM   1. Non-intractable vomiting with nausea, vomiting of unspecified type   2. Diarrhea, unspecified type   3. Epigastric abdominal tenderness with rebound tenderness   4. Right lower quadrant abdominal tenderness with rebound tenderness   5. Lower abdominal tenderness    Due to the severity of abdominal tenderness and persistent symptoms of vomiting, nausea and diarrhea, fatigue and severe weakness patient will be transferred to the ED via shuttle. Zofran 4 mg IM. The patient states that this does not work but since we have no alternative she will take it.   Hayden Rasmussen, NP 03/05/15 1630

## 2015-11-01 ENCOUNTER — Emergency Department (HOSPITAL_COMMUNITY)
Admission: EM | Admit: 2015-11-01 | Discharge: 2015-11-02 | Disposition: A | Discharge status: Home / Self Care | Payer: Medicaid Other | Attending: Emergency Medicine | Admitting: Emergency Medicine

## 2015-11-01 ENCOUNTER — Encounter (HOSPITAL_COMMUNITY): Payer: Self-pay | Admitting: Emergency Medicine

## 2015-11-01 DIAGNOSIS — F17210 Nicotine dependence, cigarettes, uncomplicated: Secondary | ICD-10-CM | POA: Insufficient documentation

## 2015-11-01 DIAGNOSIS — R1013 Epigastric pain: Secondary | ICD-10-CM | POA: Diagnosis not present

## 2015-11-01 DIAGNOSIS — J45909 Unspecified asthma, uncomplicated: Secondary | ICD-10-CM | POA: Diagnosis not present

## 2015-11-01 DIAGNOSIS — R109 Unspecified abdominal pain: Secondary | ICD-10-CM

## 2015-11-01 LAB — CBC
HEMATOCRIT: 44.8 % (ref 36.0–46.0)
HEMOGLOBIN: 15.5 g/dL — AB (ref 12.0–15.0)
MCH: 31.6 pg (ref 26.0–34.0)
MCHC: 34.6 g/dL (ref 30.0–36.0)
MCV: 91.2 fL (ref 78.0–100.0)
Platelets: 283 10*3/uL (ref 150–400)
RBC: 4.91 MIL/uL (ref 3.87–5.11)
RDW: 12.2 % (ref 11.5–15.5)
WBC: 8 10*3/uL (ref 4.0–10.5)

## 2015-11-01 LAB — URINALYSIS, ROUTINE W REFLEX MICROSCOPIC
BILIRUBIN URINE: NEGATIVE
Glucose, UA: NEGATIVE mg/dL
Hgb urine dipstick: NEGATIVE
KETONES UR: NEGATIVE mg/dL
LEUKOCYTES UA: NEGATIVE
NITRITE: NEGATIVE
Protein, ur: NEGATIVE mg/dL
SPECIFIC GRAVITY, URINE: 1.018 (ref 1.005–1.030)
pH: 5.5 (ref 5.0–8.0)

## 2015-11-01 LAB — COMPREHENSIVE METABOLIC PANEL
ALBUMIN: 3.8 g/dL (ref 3.5–5.0)
ALT: 24 U/L (ref 14–54)
ANION GAP: 8 (ref 5–15)
AST: 32 U/L (ref 15–41)
Alkaline Phosphatase: 91 U/L (ref 38–126)
BILIRUBIN TOTAL: 0.8 mg/dL (ref 0.3–1.2)
BUN: <5 mg/dL — ABNORMAL LOW (ref 6–20)
CHLORIDE: 103 mmol/L (ref 101–111)
CO2: 26 mmol/L (ref 22–32)
Calcium: 9 mg/dL (ref 8.9–10.3)
Creatinine, Ser: 0.79 mg/dL (ref 0.44–1.00)
GFR calc Af Amer: >60 mL/min (ref >60)
Glucose, Bld: 118 mg/dL — ABNORMAL HIGH (ref 65–99)
POTASSIUM: 4.4 mmol/L (ref 3.5–5.1)
Sodium: 137 mmol/L (ref 135–145)
TOTAL PROTEIN: 6.9 g/dL (ref 6.5–8.1)

## 2015-11-01 LAB — LIPASE, BLOOD: LIPASE: 22 U/L (ref 11–51)

## 2015-11-01 MED ORDER — SODIUM CHLORIDE 0.9 % IV BOLUS (SEPSIS)
1000.0000 mL | Freq: Once | INTRAVENOUS | Status: AC
  Administered 2015-11-01: 1000 mL via INTRAVENOUS

## 2015-11-01 MED ORDER — METOCLOPRAMIDE HCL 5 MG/ML IJ SOLN
10.0000 mg | Freq: Once | INTRAMUSCULAR | Status: AC
  Administered 2015-11-02: 10 mg via INTRAVENOUS
  Filled 2015-11-01: qty 2

## 2015-11-01 MED ORDER — ONDANSETRON HCL 4 MG/2ML IJ SOLN
4.0000 mg | Freq: Once | INTRAMUSCULAR | Status: AC
  Administered 2015-11-01: 4 mg via INTRAVENOUS
  Filled 2015-11-01: qty 2

## 2015-11-01 MED ORDER — HYDROMORPHONE HCL 1 MG/ML IJ SOLN
1.0000 mg | Freq: Once | INTRAMUSCULAR | Status: AC
  Administered 2015-11-02: 1 mg via INTRAVENOUS
  Filled 2015-11-01: qty 1

## 2015-11-01 MED ORDER — ONDANSETRON 4 MG PO TBDP
4.0000 mg | ORAL_TABLET | Freq: Once | ORAL | Status: AC | PRN
  Administered 2015-11-01: 4 mg via ORAL

## 2015-11-01 MED ORDER — FAMOTIDINE IN NACL 20-0.9 MG/50ML-% IV SOLN
20.0000 mg | INTRAVENOUS | Status: AC
  Administered 2015-11-01: 20 mg via INTRAVENOUS
  Filled 2015-11-01: qty 50

## 2015-11-01 MED ORDER — ONDANSETRON 4 MG PO TBDP
ORAL_TABLET | ORAL | Status: AC
  Filled 2015-11-01: qty 1

## 2015-11-01 MED ORDER — MORPHINE SULFATE (PF) 4 MG/ML IV SOLN
4.0000 mg | Freq: Once | INTRAVENOUS | Status: AC
  Administered 2015-11-01: 4 mg via INTRAVENOUS
  Filled 2015-11-01: qty 1

## 2015-11-01 NOTE — ED Provider Notes (Signed)
[facility]EM[facility]ENCY DEPT Provider Note   CSN: [id number] Arrival date & time: 11/01/15  1557     History   Chief Complaint Chief Complaint  Patient presents with  . Abdominal Pain    HPI Angie Brown is a 35 y.o. female.  The history is provided by the patient and medical records.    35 year old female with history of asthma, bronchitis, PVCs, presenting to the ED for abdominal pain, nausea, and vomiting.. Patient reports symptoms actually began yesterday afternoon with dry heaves. She states this subsided late last night and she was able to sleep but awoke this morning with sharp, gnawing pain in her epigastrium. States she has tried over-the-counter Zantac and her boyfriend Zegerid without any change in her symptoms. Her last emesis was early this morning but she has remained persistently nauseated throughout the day today. His only been able to drink a small can of Sprite and a few pieces of dry cereal today. States her bowel movements have been normal. No diarrhea, melena or hematochezia. She has not had any fever but does endorse sweats and chills with her nausea. Prior abdominal surgeries include hysterectomy, bladder suspension, and cholecystectomy. She states her current symptoms feel like her previous "gallbladder attacks".  VSS.  Past Medical History:  Diagnosis Date  . Asthma   . Bronchitis   . Dysrhythmia    PVCs- stress related  . Hypoglycemia   . PONV (postoperative nausea and vomiting)    pt prefers scop patch    Patient Active Problem List   Diagnosis Date Noted  . Stress incontinence in female 03/11/2014  . HYPOTENSION 03/18/2010  . ARM PAIN 04/28/2009  . ABNORMAL INVOLUNTARY MOVEMENTS 04/28/2009  . FACIAL PARESTHESIA 04/28/2009  . HEADACHE 04/26/2009  . WHEEZING 03/23/2009  . DIARRHEA, ACUTE, CHRONIC 02/19/2009  . HYDRONEPHROSIS, RIGHT 02/18/2009  . PYELONEPHRITIS, HX OF 02/18/2009  . MIGRAINES, HX OF 02/18/2009  . LUMBAR RADICULOPATHY, RIGHT 12/23/2008  .  ASTHMATIC BRONCHITIS, ACUTE 01/22/2008  . BACK PAIN 01/22/2008  . ELEVATED BLOOD PRESSURE WITHOUT DIAGNOSIS OF HYPERTENSION 01/22/2008  . OBESITY, MORBID 05/29/2007  . INSOMNIA-SLEEP DISORDER-UNSPEC 05/29/2007  . HYPERSOMNIA 05/29/2007  . ANEMIA-NOS 01/01/2007  . ANXIETY 01/01/2007  . DEPRESSION 01/01/2007  . COMMON MIGRAINE 01/01/2007  . ASTHMA 01/01/2007  . SYNCOPE 01/01/2007    Past Surgical History:  Procedure Laterality Date  . ABDOMINAL HYSTERECTOMY    . BACK SURGERY  07/2009   spinal fusion L 5-S1  . BLADDER SUSPENSION N/A 03/11/2014   Procedure: TRANSVAGINAL TAPE (TVT) PROCEDURE;  Surgeon: Lavina Hammanodd Meisinger, MD;  Location: [facility];  Service: Gynecology;  Laterality: N/A;  . CHOLECYSTECTOMY    . TONSILLECTOMY      OB History    No data available       Home Medications    Prior to Admission medications   Medication Sig Start Date End Date Taking? Authorizing Provider  cyclobenzaprine (FLEXERIL) 10 MG tablet Take 10 mg by mouth at bedtime as needed for muscle spasms.   Yes Historical Provider, MD  promethazine (PHENERGAN) 25 MG tablet Take 1 tablet (25 mg total) by mouth every 6 (six) hours as needed for nausea. Patient taking differently: Take 12.5 mg by mouth every 6 (six) hours as needed for nausea.  03/05/15  Yes Fayrene HelperBowie Tran, PA-C  albuterol (PROVENTIL HFA;VENTOLIN HFA) 108 (90 BASE) MCG/ACT inhaler Inhale 2 puffs into the lungs every 6 (six) hours as needed for wheezing or shortness of breath.    Historical Provider, MD  albuterol (  PROVENTIL) (2.5 MG/3ML) 0.083% nebulizer solution Take 3 mLs (2.5 mg total) by nebulization every 6 (six) hours as needed for wheezing or shortness of breath. 05/01/13   Rodolph BongEvan S Corey, MD  oxyCODONE-acetaminophen (PERCOCET/ROXICET) 5-325 MG tablet Take 1-2 tablets by mouth every 4 (four) hours as needed for severe pain. Patient not taking: Reported on 11/01/2015 03/03/15   Elpidio AnisShari Upstill, PA-C  rizatriptan (MAXALT) 5 MG tablet Take 5 mg by mouth  as needed for migraine. May repeat in 2 hours if needed    Historical Provider, MD    Family History History reviewed. No pertinent family history.  Social History Social History  Substance Use Topics  . Smoking status: Current Every Day Smoker    Packs/day: 0.50    Types: Cigarettes  . Smokeless tobacco: Never Used  . Alcohol use Yes     Comment: social     Allergies   Loratadine; Doxycycline; Ibuprofen; and Sumatriptan   Review of Systems Review of Systems  Gastrointestinal: Positive for abdominal pain, nausea and vomiting.  All other systems reviewed and are negative.    Physical Exam Updated Vital Signs BP 120/87 (BP Location: Right Arm)   Pulse 97   Temp 98.2 F (36.8 C) (Oral)   Resp 10   Ht 5\' 7"  (1.702 m)   Wt 95.3 kg   SpO2 100%   BMI 32.89 kg/m   Physical Exam  Constitutional: She is oriented to person, place, and time. She appears well-developed and well-nourished.  HENT:  Head: Normocephalic and atraumatic.  Mouth/Throat: Oropharynx is clear and moist.  Eyes: Conjunctivae and EOM are normal. Pupils are equal, round, and reactive to light.  Neck: Normal range of motion.  Cardiovascular: Normal rate, regular rhythm and normal heart sounds.   Pulmonary/Chest: Effort normal and breath sounds normal.  Abdominal: Soft. Bowel sounds are normal. She exhibits no distension. There is tenderness in the epigastric area. There is no tenderness at McBurney's point and negative Murphy's sign.  Abdomen is soft, non-distended, mild tenderness in the epigastrium without rebound or guarding; no peritonitis  Musculoskeletal: Normal range of motion.  Neurological: She is alert and oriented to person, place, and time.  Skin: Skin is warm and dry.  Psychiatric: She has a normal mood and affect.  Nursing note and vitals reviewed.    ED Treatments / Results  Labs (all labs ordered are listed, but only abnormal results are displayed) Labs Reviewed  COMPREHENSIVE  METABOLIC PANEL - Abnormal; Notable for the following:       Result Value   Glucose, Bld 118 (*)    BUN <5 (*)    All other components within normal limits  CBC - Abnormal; Notable for the following:    Hemoglobin 15.5 (*)    All other components within normal limits  URINALYSIS, ROUTINE W REFLEX MICROSCOPIC (NOT AT [facility]) - Abnormal; Notable for the following:    Color, Urine AMBER (*)    APPearance HAZY (*)    All other components within normal limits  LIPASE, BLOOD    EKG  EKG Interpretation None       Radiology No results found.  Procedures Procedures (including critical care time)  Medications Ordered in ED Medications  ondansetron (ZOFRAN-ODT) disintegrating tablet 4 mg (4 mg Oral Given 11/01/15 1606)  sodium chloride 0.9 % bolus 1,000 mL (1,000 mLs Intravenous New Bag/Given 11/01/15 2247)  ondansetron (ZOFRAN) injection 4 mg (4 mg Intravenous Given 11/01/15 2251)  morphine 4 MG/ML injection 4 mg (4 mg  Intravenous Given 11/01/15 2249)  famotidine (PEPCID) IVPB 20 mg premix (20 mg Intravenous New Bag/Given 11/01/15 2251)  HYDROmorphone (DILAUDID) injection 1 mg (1 mg Intravenous Given 11/02/15 0010)  metoCLOPramide (REGLAN) injection 10 mg (10 mg Intravenous Given 11/02/15 0010)     Initial Impression / Assessment and Plan / ED Course  I have reviewed the triage vital signs and the nursing notes.  Pertinent labs & imaging results that were available during my care of the patient were reviewed by me and considered in my medical decision making (see chart for details).  Clinical Course   35 y.o. F here with abdominal pain. She is afebrile and nontoxic. She has mild tenderness in the epigastrium without rebound or guarding.  Abdomen is soft and non-distended.  Lab work reassuring.  Patient treated here with IVF, pepcid, anti-emetics, and pain meds with improvement of symptoms.  No active emesis here.  She is tolerating fluids and crackers without difficulty.  Repeat abdominal  exam now benign.  Suspect likely gastritis type pain.  Will d/c home with supportive care.  FU with PCP.  Discussed plan with patient, she acknowledged understanding and agreed with plan of care.  Return precautions given for new or worsening symptoms.  Final Clinical Impressions(s) / ED Diagnoses   Final diagnoses:  Abdominal pain, unspecified abdominal location    New Prescriptions New Prescriptions   DICYCLOMINE (BENTYL) 20 MG TABLET    Take 1 tablet (20 mg total) by mouth 2 (two) times daily.   ONDANSETRON (ZOFRAN ODT) 4 MG DISINTEGRATING TABLET    Take 1 tablet (4 mg total) by mouth every 8 (eight) hours as needed for nausea.     Garlon Hatchet, PA-C 11/02/15 1610    Gwyneth Sprout, MD 11/02/15 2051

## 2015-11-01 NOTE — ED Triage Notes (Signed)
Abdominal pain with nausea and dry heaves. Feels like abdomen is bloated. Onset yesterday.

## 2015-11-01 NOTE — ED Notes (Signed)
Arlys JohnBrian RN and this RN tried to start IV x4 times.

## 2015-11-02 MED ORDER — ONDANSETRON 4 MG PO TBDP: 4.0000 mg | ORAL_TABLET | Freq: Three times a day (TID) | ORAL | 0 refills | Status: DC | PRN

## 2015-11-02 MED ORDER — DICYCLOMINE HCL 20 MG PO TABS: 20.0000 mg | ORAL_TABLET | Freq: Two times a day (BID) | ORAL | 0 refills | Status: DC

## 2015-11-02 NOTE — Discharge Instructions (Signed)
Take the prescribed medication as directed. Recommend gentle diet for the next few days and progress back to normal as tolerated. Follow-up with your primary care doctor. Return to the ED for new or worsening symptoms.

## 2015-12-14 ENCOUNTER — Encounter (HOSPITAL_COMMUNITY): Payer: Self-pay

## 2015-12-14 ENCOUNTER — Emergency Department (HOSPITAL_COMMUNITY)
Admission: EM | Admit: 2015-12-14 | Discharge: 2015-12-14 | Disposition: A | Discharge status: Home / Self Care | Payer: Medicaid Other | Attending: Emergency Medicine | Admitting: Emergency Medicine

## 2015-12-14 DIAGNOSIS — F17210 Nicotine dependence, cigarettes, uncomplicated: Secondary | ICD-10-CM | POA: Diagnosis not present

## 2015-12-14 DIAGNOSIS — T63441A Toxic effect of venom of bees, accidental (unintentional), initial encounter: Secondary | ICD-10-CM | POA: Insufficient documentation

## 2015-12-14 DIAGNOSIS — T7840XA Allergy, unspecified, initial encounter: Secondary | ICD-10-CM

## 2015-12-14 DIAGNOSIS — J45909 Unspecified asthma, uncomplicated: Secondary | ICD-10-CM | POA: Diagnosis not present

## 2015-12-14 MED ORDER — DIPHENHYDRAMINE HCL 50 MG/ML IJ SOLN
25.0000 mg | Freq: Once | INTRAMUSCULAR | Status: AC
  Administered 2015-12-14: 25 mg via INTRAVENOUS
  Filled 2015-12-14: qty 1

## 2015-12-14 MED ORDER — DIPHENHYDRAMINE HCL 25 MG PO CAPS
25.0000 mg | ORAL_CAPSULE | Freq: Once | ORAL | Status: AC
  Administered 2015-12-14: 25 mg via ORAL
  Filled 2015-12-14: qty 1

## 2015-12-14 MED ORDER — PREDNISONE 20 MG PO TABS: 20.0000 mg | ORAL_TABLET | Freq: Every day | ORAL | 0 refills | Status: DC

## 2015-12-14 MED ORDER — FAMOTIDINE 20 MG PO TABS: 20.0000 mg | ORAL_TABLET | Freq: Two times a day (BID) | ORAL | 0 refills | Status: DC

## 2015-12-14 MED ORDER — SODIUM CHLORIDE 0.9 % IV BOLUS (SEPSIS)
1000.0000 mL | Freq: Once | INTRAVENOUS | Status: AC
  Administered 2015-12-14: 1000 mL via INTRAVENOUS

## 2015-12-14 MED ORDER — LORAZEPAM 2 MG/ML IJ SOLN
1.0000 mg | Freq: Once | INTRAMUSCULAR | Status: AC
  Administered 2015-12-14: 1 mg via INTRAVENOUS
  Filled 2015-12-14: qty 1

## 2015-12-14 MED ORDER — ONDANSETRON HCL 4 MG/2ML IJ SOLN
4.0000 mg | Freq: Once | INTRAMUSCULAR | Status: AC
  Administered 2015-12-14: 4 mg via INTRAVENOUS
  Filled 2015-12-14: qty 2

## 2015-12-14 MED ORDER — METHYLPREDNISOLONE SODIUM SUCC 125 MG IJ SOLR
125.0000 mg | Freq: Once | INTRAMUSCULAR | Status: AC
  Administered 2015-12-14: 125 mg via INTRAVENOUS
  Filled 2015-12-14: qty 2

## 2015-12-14 MED ORDER — FAMOTIDINE IN NACL 20-0.9 MG/50ML-% IV SOLN
20.0000 mg | Freq: Once | INTRAVENOUS | Status: AC
  Administered 2015-12-14: 20 mg via INTRAVENOUS
  Filled 2015-12-14: qty 50

## 2015-12-14 NOTE — ED Provider Notes (Signed)
[facility] Provider Note   CSN: [id number] Arrival date & time: 12/14/15  9562  History   Chief Complaint Chief Complaint  Patient presents with  . Insect Bite    HPI Angie Brown is a 35 y.o. female.  HPI   Patient with PMH of asthma, bronchitis, dysrhythmia, PVCs, hypoglycemia, PNOV  She was stung by a wasp yesterday and had nausea, vomiting, diffuse hives yesterday. She took 50 mg Benadryl at that time and then 50mg  Benadryl before bed. Her hives improved, the N/V resolved. Today she comes to the ER because she is still diffusely itching, the wasp sting is still red. She does not know that she is allergic to wasp stings to the best of her knowledge. She has not had any benadryl today no abdominal pain, CP, SOB, throat or tongue swelling, no LE swelling.     Past Medical History:  Diagnosis Date  . Asthma   . Bronchitis   . Dysrhythmia    PVCs- stress related  . Hypoglycemia   . PONV (postoperative nausea and vomiting)    pt prefers scop patch    Patient Active Problem List   Diagnosis Date Noted  . Stress incontinence in female 03/11/2014  . HYPOTENSION 03/18/2010  . ARM PAIN 04/28/2009  . ABNORMAL INVOLUNTARY MOVEMENTS 04/28/2009  . FACIAL PARESTHESIA 04/28/2009  . HEADACHE 04/26/2009  . WHEEZING 03/23/2009  . DIARRHEA, ACUTE, CHRONIC 02/19/2009  . HYDRONEPHROSIS, RIGHT 02/18/2009  . PYELONEPHRITIS, HX OF 02/18/2009  . MIGRAINES, HX OF 02/18/2009  . LUMBAR RADICULOPATHY, RIGHT 12/23/2008  . ASTHMATIC BRONCHITIS, ACUTE 01/22/2008  . BACK PAIN 01/22/2008  . ELEVATED BLOOD PRESSURE WITHOUT DIAGNOSIS OF HYPERTENSION 01/22/2008  . OBESITY, MORBID 05/29/2007  . INSOMNIA-SLEEP DISORDER-UNSPEC 05/29/2007  . HYPERSOMNIA 05/29/2007  . ANEMIA-NOS 01/01/2007  . ANXIETY 01/01/2007  . DEPRESSION 01/01/2007  . COMMON MIGRAINE 01/01/2007  . ASTHMA 01/01/2007  . SYNCOPE 01/01/2007    Past Surgical History:  Procedure Laterality Date  . ABDOMINAL  HYSTERECTOMY    . BACK SURGERY  07/2009   spinal fusion L 5-S1  . BLADDER SUSPENSION N/A 03/11/2014   Procedure: TRANSVAGINAL TAPE (TVT) PROCEDURE;  Surgeon: Lavina Hamman, MD;  Location: [facility];  Service: Gynecology;  Laterality: N/A;  . CHOLECYSTECTOMY    . TONSILLECTOMY      OB History    No data available       Home Medications    Prior to Admission medications   Medication Sig Start Date End Date Taking? Authorizing Provider  albuterol (PROVENTIL HFA;VENTOLIN HFA) 108 (90 BASE) MCG/ACT inhaler Inhale 2 puffs into the lungs every 6 (six) hours as needed for wheezing or shortness of breath.    Historical Provider, MD  albuterol (PROVENTIL) (2.5 MG/3ML) 0.083% nebulizer solution Take 3 mLs (2.5 mg total) by nebulization every 6 (six) hours as needed for wheezing or shortness of breath. 05/01/13   Rodolph Bong, MD  cyclobenzaprine (FLEXERIL) 10 MG tablet Take 10 mg by mouth at bedtime as needed for muscle spasms.    Historical Provider, MD  dicyclomine (BENTYL) 20 MG tablet Take 1 tablet (20 mg total) by mouth 2 (two) times daily. 11/02/15   Garlon Hatchet, PA-C  famotidine (PEPCID) 20 MG tablet Take 1 tablet (20 mg total) by mouth 2 (two) times daily. 12/14/15   Jodette Wik Neva Seat, PA-C  ondansetron (ZOFRAN ODT) 4 MG disintegrating tablet Take 1 tablet (4 mg total) by mouth every 8 (eight) hours as needed for nausea. 11/02/15  Garlon Hatchet, PA-C  oxyCODONE-acetaminophen (PERCOCET/ROXICET) 5-325 MG tablet Take 1-2 tablets by mouth every 4 (four) hours as needed for severe pain. Patient not taking: Reported on 11/01/2015 03/03/15   Elpidio Anis, PA-C  predniSONE (DELTASONE) 20 MG tablet Take 1 tablet (20 mg total) by mouth daily. Prednisone dose pack directions:   5 tabs on day one, 4 tabs on day two, 3 tabs on day three, 2 tabs on day four, 1 tab on day five. Disp # [address]  Marlon Pel, PA-C 12/14/15   Marlon Pel, PA-C  promethazine (PHENERGAN) 25 MG tablet Take 1 tablet (25 mg total)  by mouth every 6 (six) hours as needed for nausea. Patient taking differently: Take 12.5 mg by mouth every 6 (six) hours as needed for nausea.  03/05/15   Fayrene Helper, PA-C  rizatriptan (MAXALT) 5 MG tablet Take 5 mg by mouth as needed for migraine. May repeat in 2 hours if needed    Historical Provider, MD    Family History History reviewed. No pertinent family history.  Social History Social History  Substance Use Topics  . Smoking status: Current Every Day Smoker    Packs/day: 0.50    Types: Cigarettes  . Smokeless tobacco: Never Used  . Alcohol use Yes     Comment: social     Allergies   Loratadine; Doxycycline; Ibuprofen; and Sumatriptan   Review of Systems Review of Systems  Review of Systems All other systems negative except as documented in the HPI. All pertinent positives and negatives as reviewed in the HPI.   Physical Exam Updated Vital Signs BP 122/79   Pulse 95   Temp 98.1 F (36.7 C) (Oral)   Resp 15   Ht 5\' 7"  (1.702 m)   Wt 97.5 kg   SpO2 97%   BMI 33.67 kg/m   Physical Exam  Constitutional: She appears well-developed and well-nourished. No distress.  HENT:  Head: Normocephalic and atraumatic.  Right Ear: Tympanic membrane and ear canal normal.  Left Ear: Tympanic membrane and ear canal normal.  Nose: Nose normal.  Mouth/Throat: Uvula is midline, oropharynx is clear and moist and mucous membranes are normal.  Eyes: Pupils are equal, round, and reactive to light.  Neck: Normal range of motion. Neck supple.  Cardiovascular: Normal rate and regular rhythm.   Pulmonary/Chest: Effort normal and breath sounds normal. She has no wheezes. She has no rhonchi.  Abdominal: Soft.  No signs of abdominal distention  Musculoskeletal:  No LE swelling  Neurological: She is alert.  Acting at baseline  Skin: Skin is warm and dry. Rash noted. Rash is urticarial (sting to left forearm, with surrounding 3 mc diameter erythemat and induration.).  Nursing note and  vitals reviewed.   ED Treatments / Results  Labs (all labs ordered are listed, but only abnormal results are displayed) Labs Reviewed - No data to display  EKG  EKG Interpretation None       Radiology No results found.  Procedures Procedures (including critical care time)  Medications Ordered in ED Medications  diphenhydrAMINE (BENADRYL) capsule 25 mg (not administered)  sodium chloride 0.9 % bolus 1,000 mL (1,000 mLs Intravenous New Bag/Given 12/14/15 1028)  methylPREDNISolone sodium succinate (SOLU-MEDROL) 125 mg/2 mL injection 125 mg (125 mg Intravenous Given 12/14/15 1028)  famotidine (PEPCID) IVPB 20 mg premix (0 mg Intravenous Stopped 12/14/15 1105)  diphenhydrAMINE (BENADRYL) injection 25 mg (25 mg Intravenous Given 12/14/15 1028)  ondansetron (ZOFRAN) injection 4 mg (4 mg Intravenous Given 12/14/15 1028)  LORazepam (ATIVAN) injection 1 mg (1 mg Intravenous Given 12/14/15 1112)     Initial Impression / Assessment and Plan / ED Course  I have reviewed the triage vital signs and the nursing notes.  Pertinent labs & imaging results that were available during my care of the patient were reviewed by me and considered in my medical decision making (see chart for details).  Clinical Course    Patient with what sounds like possibly an anaphylactic reaction yesterday. So far today she just has diffuse itching and mild hives. Will treat with medications in the ED and started on medications of prednisone, pepcid, benadryl prn for home for 1 week.  Final Clinical Impressions(s) / ED Diagnoses   Final diagnoses:  Bee sting, accidental or unintentional, initial encounter  Allergic reaction, initial encounter    New Prescriptions Current Discharge Medication List    START taking these medications   Details  famotidine (PEPCID) 20 MG tablet Take 1 tablet (20 mg total) by mouth 2 (two) times daily. Qty: 30 tablet, Refills: 0    predniSONE (DELTASONE) 20 MG tablet Take 1  tablet (20 mg total) by mouth daily. Prednisone dose pack directions:   5 tabs on day one, 4 tabs on day two, 3 tabs on day three, 2 tabs on day four, 1 tab on day five. Disp # 15  Marlon Peliffany Lakrista Scaduto, PA-C Qty: 20 tablet, Refills: 0         Marlon Peliffany Timaya Bojarski, PA-C 12/14/15 1054    [address] Rosilyn Coachman, PA-C 12/14/15 1237    Alvira MondayErin Schlossman, MD 12/17/15 1058

## 2015-12-14 NOTE — ED Triage Notes (Addendum)
Pt reports she was stung by a wasp yesterday. She reports she has itching "all over" yesterday that still continues today. Some small hives noted to right hand but none on the chest. No shortness of breath reported.

## 2015-12-14 NOTE — ED Notes (Signed)
Placed patient into a gown and on the monitor 

## 2016-01-05 DIAGNOSIS — F339 Major depressive disorder, recurrent, unspecified: Secondary | ICD-10-CM | POA: Insufficient documentation

## 2016-01-05 DIAGNOSIS — K219 Gastro-esophageal reflux disease without esophagitis: Secondary | ICD-10-CM | POA: Insufficient documentation

## 2016-01-05 DIAGNOSIS — F411 Generalized anxiety disorder: Secondary | ICD-10-CM | POA: Insufficient documentation

## 2016-01-05 DIAGNOSIS — R030 Elevated blood-pressure reading, without diagnosis of hypertension: Secondary | ICD-10-CM | POA: Insufficient documentation

## 2016-03-01 DIAGNOSIS — G4709 Other insomnia: Secondary | ICD-10-CM | POA: Insufficient documentation

## 2016-03-08 DIAGNOSIS — N398 Other specified disorders of urinary system: Secondary | ICD-10-CM | POA: Insufficient documentation

## 2016-05-24 DIAGNOSIS — N3642 Intrinsic sphincter deficiency (ISD): Secondary | ICD-10-CM | POA: Insufficient documentation

## 2016-06-09 ENCOUNTER — Ambulatory Visit (HOSPITAL_COMMUNITY)
Admission: EM | Admit: 2016-06-09 | Discharge: 2016-06-09 | Disposition: A | Discharge status: Home / Self Care | Payer: Medicaid Other | Attending: Family Medicine | Admitting: Family Medicine

## 2016-06-09 ENCOUNTER — Encounter (HOSPITAL_COMMUNITY): Payer: Self-pay | Admitting: Family Medicine

## 2016-06-09 DIAGNOSIS — R21 Rash and other nonspecific skin eruption: Secondary | ICD-10-CM

## 2016-06-09 DIAGNOSIS — B372 Candidiasis of skin and nail: Secondary | ICD-10-CM

## 2016-06-09 DIAGNOSIS — L22 Diaper dermatitis: Secondary | ICD-10-CM

## 2016-06-09 MED ORDER — FLUCONAZOLE 150 MG PO TABS: 150.0000 mg | ORAL_TABLET | Freq: Every day | ORAL | 1 refills | Status: DC

## 2016-06-09 MED ORDER — NYSTATIN-TRIAMCINOLONE 100000-0.1 UNIT/GM-% EX CREA: TOPICAL_CREAM | CUTANEOUS | 1 refills | Status: DC

## 2016-06-09 NOTE — ED Provider Notes (Signed)
CSN: [id number]     Arrival date & time 06/09/16  1245 History   None    Chief Complaint  Patient presents with  . Abscess  . Hemorrhoids   (Consider location/radiation/quality/duration/timing/severity/associated sxs/prior Treatment) Patient c/o discomfort in peri-rectal area.   The history is provided by the patient.  Abscess  Abscess location: peri rectal area. Size:  6 cm Abscess quality: painful and redness   Red streaking: no   Duration:  2 days Progression:  Worsening Pain details:    Quality:  Aching   Severity:  Moderate   Duration:  2 days   Timing:  Constant Chronicity:  New   Past Medical History:  Diagnosis Date  . Asthma   . Bronchitis   . Dysrhythmia    PVCs- stress related  . Hypoglycemia   . PONV (postoperative nausea and vomiting)    pt prefers scop patch   Past Surgical History:  Procedure Laterality Date  . ABDOMINAL HYSTERECTOMY    . BACK SURGERY  07/2009   spinal fusion L 5-S1  . BLADDER SUSPENSION N/A 03/11/2014   Procedure: TRANSVAGINAL TAPE (TVT) PROCEDURE;  Surgeon: Lavina Hammanodd Meisinger, MD;  Location: [facility];  Service: Gynecology;  Laterality: N/A;  . CHOLECYSTECTOMY    . TONSILLECTOMY     History reviewed. No pertinent family history. Social History  Substance Use Topics  . Smoking status: Current Every Day Smoker    Packs/day: 0.50    Types: Cigarettes  . Smokeless tobacco: Never Used  . Alcohol use Yes     Comment: social   OB History    No data available     Review of Systems  Constitutional: Negative.   HENT: Negative.   Eyes: Negative.   Respiratory: Negative.   Cardiovascular: Negative.   Gastrointestinal: Negative.   Endocrine: Negative.   Genitourinary: Negative.   Musculoskeletal: Negative.   Skin: Positive for rash.  Allergic/Immunologic: Negative.   Neurological: Negative.   Hematological: Negative.   Psychiatric/Behavioral: Negative.     Allergies  Loratadine; Doxycycline; Ibuprofen; and  Sumatriptan  Home Medications   Prior to Admission medications   Medication Sig Start Date End Date Taking? Authorizing Provider  albuterol (PROVENTIL HFA;VENTOLIN HFA) 108 (90 BASE) MCG/ACT inhaler Inhale 2 puffs into the lungs every 6 (six) hours as needed for wheezing or shortness of breath.    Historical Provider, MD  albuterol (PROVENTIL) (2.5 MG/3ML) 0.083% nebulizer solution Take 3 mLs (2.5 mg total) by nebulization every 6 (six) hours as needed for wheezing or shortness of breath. 05/01/13   Rodolph BongEvan S Corey, MD  cyclobenzaprine (FLEXERIL) 10 MG tablet Take 10 mg by mouth at bedtime as needed for muscle spasms.    Historical Provider, MD  famotidine (PEPCID) 20 MG tablet Take 1 tablet (20 mg total) by mouth 2 (two) times daily. 12/14/15   Tiffany Neva SeatGreene, PA-C  fluconazole (DIFLUCAN) 150 MG tablet Take 1 tablet (150 mg total) by mouth daily. 06/09/16   Deatra CanterWilliam J Oxford, FNP  nystatin-triamcinolone [facility](MYCOLOG II) cream Apply to affected area daily 06/09/16   Deatra CanterWilliam J Oxford, FNP  ondansetron (ZOFRAN ODT) 4 MG disintegrating tablet Take 1 tablet (4 mg total) by mouth every 8 (eight) hours as needed for nausea. 11/02/15   Garlon HatchetLisa M Sanders, PA-C  oxyCODONE-acetaminophen (PERCOCET/ROXICET) 5-325 MG tablet Take 1-2 tablets by mouth every 4 (four) hours as needed for severe pain. Patient not taking: Reported on 11/01/2015 03/03/15   Elpidio AnisShari Upstill, PA-C  predniSONE (DELTASONE) 20 MG tablet Take 1  tablet (20 mg total) by mouth daily. Prednisone dose pack directions:   5 tabs on day one, 4 tabs on day two, 3 tabs on day three, 2 tabs on day four, 1 tab on day five. Disp # [id number]  Marlon Pel, PA-C 12/14/15   Marlon Pel, PA-C  promethazine (PHENERGAN) 25 MG tablet Take 1 tablet (25 mg total) by mouth every 6 (six) hours as needed for nausea. Patient taking differently: Take 12.5 mg by mouth every 6 (six) hours as needed for nausea.  03/05/15   Fayrene Helper, PA-C  rizatriptan (MAXALT) 5 MG tablet Take 5 mg by  mouth as needed for migraine. May repeat in 2 hours if needed    Historical Provider, MD   Meds Ordered and Administered this Visit  Medications - No data to display  BP (!) 138/109   Pulse (!) 118   Temp 98.8 F (37.1 C)   Resp 18   SpO2 100%  No data found.   Physical Exam  Constitutional: She is oriented to person, place, and time. She appears well-developed and well-nourished.  HENT:  Head: Normocephalic and atraumatic.  Eyes: Conjunctivae and EOM are normal. Pupils are equal, round, and reactive to light.  Cardiovascular: Normal rate, regular rhythm and normal heart sounds.   Pulmonary/Chest: Effort normal and breath sounds normal.  Genitourinary:  Genitourinary Comments: Rectal - Normal tone and no abscess palpated  Neurological: She is alert and oriented to person, place, and time.  Skin: Rash noted.  Perineum and peri rectal region with erythema and maceration and tenderness.  Nursing note and vitals reviewed.   Urgent Care Course     Procedures (including critical care time)  Labs Review Labs Reviewed - No data to display  Imaging Review No results found.   Visual Acuity Review  Right Eye Distance:   Left Eye Distance:   Bilateral Distance:    Right Eye Near:   Left Eye Near:    Bilateral Near:         MDM   1. Rash of perineum   2. Candidal diaper rash    Diflucan 150mg  one po qd x 3days /1 refill Nystatin cream bid  Sitz baths prn    Deatra Canter, FNP 06/09/16 1430

## 2016-06-09 NOTE — ED Triage Notes (Signed)
Pt here for abscess to rectum and hemmrhoids.

## 2016-06-29 DIAGNOSIS — G8929 Other chronic pain: Secondary | ICD-10-CM | POA: Insufficient documentation

## 2016-09-29 DIAGNOSIS — R7309 Other abnormal glucose: Secondary | ICD-10-CM | POA: Insufficient documentation

## 2016-09-29 DIAGNOSIS — T782XXA Anaphylactic shock, unspecified, initial encounter: Secondary | ICD-10-CM | POA: Insufficient documentation

## 2016-10-27 ENCOUNTER — Emergency Department (HOSPITAL_COMMUNITY): Payer: Medicaid Other

## 2016-10-27 ENCOUNTER — Emergency Department (HOSPITAL_COMMUNITY)
Admission: EM | Admit: 2016-10-27 | Discharge: 2016-10-27 | Disposition: A | Discharge status: Home / Self Care | Payer: Medicaid Other | Attending: Emergency Medicine | Admitting: Emergency Medicine

## 2016-10-27 ENCOUNTER — Encounter (HOSPITAL_COMMUNITY): Payer: Self-pay | Admitting: Emergency Medicine

## 2016-10-27 DIAGNOSIS — R51 Headache: Secondary | ICD-10-CM | POA: Diagnosis not present

## 2016-10-27 DIAGNOSIS — R55 Syncope and collapse: Secondary | ICD-10-CM | POA: Diagnosis present

## 2016-10-27 DIAGNOSIS — J45909 Unspecified asthma, uncomplicated: Secondary | ICD-10-CM | POA: Diagnosis not present

## 2016-10-27 DIAGNOSIS — F17210 Nicotine dependence, cigarettes, uncomplicated: Secondary | ICD-10-CM | POA: Insufficient documentation

## 2016-10-27 DIAGNOSIS — Z79899 Other long term (current) drug therapy: Secondary | ICD-10-CM | POA: Diagnosis not present

## 2016-10-27 DIAGNOSIS — R519 Headache, unspecified: Secondary | ICD-10-CM

## 2016-10-27 LAB — BASIC METABOLIC PANEL
Anion gap: 9 (ref 5–15)
BUN: 7 mg/dL (ref 6–20)
CHLORIDE: 113 mmol/L — AB (ref 101–111)
CO2: 23 mmol/L (ref 22–32)
Calcium: 8.5 mg/dL — ABNORMAL LOW (ref 8.9–10.3)
Creatinine, Ser: 0.75 mg/dL (ref 0.44–1.00)
GFR calc non Af Amer: >60 mL/min (ref >60)
GLUCOSE: 87 mg/dL (ref 65–99)
Potassium: 3.6 mmol/L (ref 3.5–5.1)
Sodium: 145 mmol/L (ref 135–145)

## 2016-10-27 LAB — URINALYSIS, ROUTINE W REFLEX MICROSCOPIC
BILIRUBIN URINE: NEGATIVE
Glucose, UA: NEGATIVE mg/dL
HGB URINE DIPSTICK: NEGATIVE
Ketones, ur: NEGATIVE mg/dL
Leukocytes, UA: NEGATIVE
NITRITE: NEGATIVE
PROTEIN: NEGATIVE mg/dL
Specific Gravity, Urine: 1.004 — ABNORMAL LOW (ref 1.005–1.030)
pH: 8 (ref 5.0–8.0)

## 2016-10-27 LAB — I-STAT BETA HCG BLOOD, ED (MC, WL, AP ONLY): I-stat hCG, quantitative: <5 m[IU]/mL (ref <5)

## 2016-10-27 LAB — CBC
HCT: 41.1 % (ref 36.0–46.0)
Hemoglobin: 14.3 g/dL (ref 12.0–15.0)
MCH: 31.7 pg (ref 26.0–34.0)
MCHC: 34.8 g/dL (ref 30.0–36.0)
MCV: 91.1 fL (ref 78.0–100.0)
PLATELETS: 268 10*3/uL (ref 150–400)
RBC: 4.51 MIL/uL (ref 3.87–5.11)
RDW: 12.6 % (ref 11.5–15.5)
WBC: 10.1 10*3/uL (ref 4.0–10.5)

## 2016-10-27 LAB — CBG MONITORING, ED: Glucose-Capillary: 90 mg/dL (ref 65–99)

## 2016-10-27 MED ORDER — METOCLOPRAMIDE HCL 5 MG/ML IJ SOLN
10.0000 mg | Freq: Once | INTRAMUSCULAR | Status: AC
  Administered 2016-10-27: 10 mg via INTRAVENOUS
  Filled 2016-10-27: qty 2

## 2016-10-27 MED ORDER — KETOROLAC TROMETHAMINE 30 MG/ML IJ SOLN
30.0000 mg | Freq: Once | INTRAMUSCULAR | Status: AC
  Administered 2016-10-27: 30 mg via INTRAVENOUS
  Filled 2016-10-27: qty 1

## 2016-10-27 MED ORDER — SODIUM CHLORIDE 0.9 % IV BOLUS (SEPSIS)
1000.0000 mL | Freq: Once | INTRAVENOUS | Status: AC
  Administered 2016-10-27: 1000 mL via INTRAVENOUS

## 2016-10-27 MED ORDER — ONDANSETRON HCL 4 MG/2ML IJ SOLN
4.0000 mg | Freq: Once | INTRAMUSCULAR | Status: AC
  Administered 2016-10-27: 4 mg via INTRAVENOUS
  Filled 2016-10-27: qty 2

## 2016-10-27 MED ORDER — DIPHENHYDRAMINE HCL 50 MG/ML IJ SOLN
25.0000 mg | Freq: Once | INTRAMUSCULAR | Status: AC
  Administered 2016-10-27: 25 mg via INTRAVENOUS
  Filled 2016-10-27: qty 1

## 2016-10-27 NOTE — ED Triage Notes (Signed)
Brought in by EMS from home with c/o loss of consciousness.  Pt reported,  "All I can remember was I was standing and got dizzy, then I woke up lying down.  My daughter was with me".  Pt denies hitting head but c/o headache.  Pt reported that she has been working in her yard under the sun all day  and forgot to drink fluids.  Pt was given a total of NS 2000 ml bolus; pt's CBG was 135 on scene.

## 2016-10-27 NOTE — ED Provider Notes (Signed)
[facility]-EMERGENCY DEPT Provider Note   CSN: [id number] Arrival date & time: 10/27/16  1907     History   Chief Complaint Chief Complaint  Patient presents with  . Loss of Consciousness    HPI Angie Brown is a 36 y.o. female.  HPI Patient with unwitnessed episode of syncope. States that she's been working in the yard today and set up around 6:00. She became lightheaded and woke up on the ground. Unknown period of loss of consciousness. Denies any head injury. States she does have a headache which she describes as throbbing in nature associated with photophobia and nausea. Denies any focal weakness or numbness. States she is not been drinking enough fluids. Has a history of previous syncopal episodes associated to heat exposure. Past Medical History:  Diagnosis Date  . Asthma   . Bronchitis   . Dysrhythmia    PVCs- stress related  . Hypoglycemia   . PONV (postoperative nausea and vomiting)    pt prefers scop patch    Patient Active Problem List   Diagnosis Date Noted  . Stress incontinence in female 03/11/2014  . HYPOTENSION 03/18/2010  . ARM PAIN 04/28/2009  . ABNORMAL INVOLUNTARY MOVEMENTS 04/28/2009  . FACIAL PARESTHESIA 04/28/2009  . HEADACHE 04/26/2009  . WHEEZING 03/23/2009  . DIARRHEA, ACUTE, CHRONIC 02/19/2009  . HYDRONEPHROSIS, RIGHT 02/18/2009  . PYELONEPHRITIS, HX OF 02/18/2009  . MIGRAINES, HX OF 02/18/2009  . LUMBAR RADICULOPATHY, RIGHT 12/23/2008  . ASTHMATIC BRONCHITIS, ACUTE 01/22/2008  . BACK PAIN 01/22/2008  . ELEVATED BLOOD PRESSURE WITHOUT DIAGNOSIS OF HYPERTENSION 01/22/2008  . OBESITY, MORBID 05/29/2007  . INSOMNIA-SLEEP DISORDER-UNSPEC 05/29/2007  . HYPERSOMNIA 05/29/2007  . ANEMIA-NOS 01/01/2007  . ANXIETY 01/01/2007  . DEPRESSION 01/01/2007  . COMMON MIGRAINE 01/01/2007  . ASTHMA 01/01/2007  . SYNCOPE 01/01/2007    Past Surgical History:  Procedure Laterality Date  . ABDOMINAL HYSTERECTOMY    . BACK SURGERY  07/2009   spinal fusion L  5-S1  . BLADDER SUSPENSION N/A 03/11/2014   Procedure: TRANSVAGINAL TAPE (TVT) PROCEDURE;  Surgeon: Lavina Hamman, MD;  Location: [facility];  Service: Gynecology;  Laterality: N/A;  . CHOLECYSTECTOMY    . TONSILLECTOMY      OB History    No data available       Home Medications    Prior to Admission medications   Medication Sig Start Date End Date Taking? Authorizing Provider  albuterol (PROVENTIL HFA;VENTOLIN HFA) 108 (90 BASE) MCG/ACT inhaler Inhale 2 puffs into the lungs every 6 (six) hours as needed for wheezing or shortness of breath.   Yes [provider]  clonazePAM (KLONOPIN) 0.5 MG tablet Take 0.5 mg by mouth at bedtime. 10/05/16  Yes [provider]  cyclobenzaprine (FLEXERIL) 10 MG tablet Take 10 mg by mouth at bedtime as needed for muscle spasms.   Yes [provider]  EPIPEN 2-PAK 0.3 MG/0.3ML SOAJ injection 0.3 mg once.  10/04/16  Yes [provider]  famotidine (PEPCID) 20 MG tablet Take 1 tablet (20 mg total) by mouth 2 (two) times daily. 12/14/15  Yes Neva Seat, Tiffany, PA-C  oxyCODONE-acetaminophen (PERCOCET) 10-325 MG tablet Take 0.5-1 tablets by mouth every 6 (six) hours as needed for pain. 10/13/16  Yes [provider]  rizatriptan (MAXALT) 5 MG tablet Take 5 mg by mouth as needed for migraine. May repeat in 2 hours if needed   Yes [provider]  albuterol (PROVENTIL) (2.5 MG/3ML) 0.083% nebulizer solution Take 3 mLs (2.5 mg total) by nebulization every  6 (six) hours as needed for wheezing or shortness of breath. Patient not taking: Reported on 10/27/2016 05/01/13   Rodolph Bongorey, Evan S, MD  fluconazole (DIFLUCAN) 150 MG tablet Take 1 tablet (150 mg total) by mouth daily. Patient not taking: Reported on 10/27/2016 06/09/16   Deatra Canterxford, William J, FNP  nystatin-triamcinolone [facility](MYCOLOG II) cream Apply to affected area daily Patient not taking: Reported on 10/27/2016 06/09/16   Deatra Canterxford, William J, FNP  ondansetron (ZOFRAN ODT) 4 MG  disintegrating tablet Take 1 tablet (4 mg total) by mouth every 8 (eight) hours as needed for nausea. Patient not taking: Reported on 10/27/2016 11/02/15   Garlon HatchetSanders, Lisa M, PA-C  oxyCODONE-acetaminophen (PERCOCET/ROXICET) 5-325 MG tablet Take 1-2 tablets by mouth every 4 (four) hours as needed for severe pain. Patient not taking: Reported on 10/27/2016 03/03/15   Elpidio AnisUpstill, Shari, PA-C  predniSONE (DELTASONE) 20 MG tablet Take 1 tablet (20 mg total) by mouth daily. Prednisone dose pack directions:   5 tabs on day one, 4 tabs on day two, 3 tabs on day three, 2 tabs on day four, 1 tab on day five. Disp # 15  Marlon Peliffany Greene, PA-C Patient not taking: Reported on 10/27/2016 12/14/15   Marlon PelGreene, Tiffany, PA-C  promethazine (PHENERGAN) 25 MG tablet Take 1 tablet (25 mg total) by mouth every 6 (six) hours as needed for nausea. Patient not taking: Reported on 10/27/2016 03/05/15   Fayrene Helperran, Bowie, PA-C    Family History History reviewed. No pertinent family history.  Social History Social History  Substance Use Topics  . Smoking status: Current Every Day Smoker    Packs/day: 0.50    Types: Cigarettes  . Smokeless tobacco: Never Used  . Alcohol use Yes     Comment: social     Allergies   Loratadine; Doxycycline; Ibuprofen; Sumatriptan; and Methocarbamol   Review of Systems Review of Systems  Constitutional: Negative for chills, fatigue and fever.  HENT: Negative for congestion, ear pain, facial swelling, sinus pain, sinus pressure, trouble swallowing and voice change.   Eyes: Positive for photophobia. Negative for visual disturbance.  Respiratory: Negative for cough and shortness of breath.   Cardiovascular: Negative for chest pain, palpitations and leg swelling.  Gastrointestinal: Positive for nausea. Negative for abdominal pain, constipation, diarrhea and vomiting.  Genitourinary: Negative for dysuria, flank pain, frequency and hematuria.  Musculoskeletal: Negative for back pain, myalgias, neck  pain and neck stiffness.  Skin: Negative for rash and wound.  Neurological: Positive for dizziness, syncope, light-headedness and headaches. Negative for weakness and numbness.  All other systems reviewed and are negative.    Physical Exam Updated Vital Signs BP (!) 133/94   Pulse (!) 103   Temp 98.4 F (36.9 C) (Oral)   Resp 18   Ht 5\' 7"  (1.702 m)   Wt 98.9 kg (218 lb)   SpO2 100%   BMI 34.14 kg/m   Physical Exam  Constitutional: She is oriented to person, place, and time. She appears well-developed and well-nourished. No distress.  HENT:  Head: Normocephalic and atraumatic.  Mouth/Throat: Oropharynx is clear and moist.  No evidence of head injury. No scalp hematoma, posterior auricular ecchymosis or periorbital ecchymosis.  Eyes: Pupils are equal, round, and reactive to light. EOM are normal.  Neck: Normal range of motion. Neck supple.  No posterior midline cervical tenderness to palpation. No meningismus.  Cardiovascular: Regular rhythm.  Exam reveals no gallop and no friction rub.   No murmur heard. Mild tachycardia  Pulmonary/Chest: Effort normal and breath sounds  normal. No respiratory distress. She has no wheezes. She has no rales. She exhibits no tenderness.  Abdominal: Soft. Bowel sounds are normal. There is no tenderness. There is no rebound and no guarding.  Musculoskeletal: Normal range of motion. She exhibits no edema or tenderness.  No midline thoracic or lumbar tenderness. No CVA tenderness. No lower extremity swelling, asymmetry or tenderness. Distal pulses are 2+.  Neurological: She is alert and oriented to person, place, and time.  Patient is alert and oriented x3 with clear, goal oriented speech. Patient has 5/5 motor in all extremities. Sensation is intact to light touch. Bilateral finger-to-nose is normal with no signs of dysmetria.  Skin: Skin is warm and dry. Capillary refill takes less than 2 seconds. No rash noted. No erythema.  Psychiatric: She has a  normal mood and affect. Her behavior is normal.  Nursing note and vitals reviewed.    ED Treatments / Results  Labs (all labs ordered are listed, but only abnormal results are displayed) Labs Reviewed  BASIC METABOLIC PANEL - Abnormal; Notable for the following:       Result Value   Chloride 113 (*)    Calcium 8.5 (*)    All other components within normal limits  URINALYSIS, ROUTINE W REFLEX MICROSCOPIC - Abnormal; Notable for the following:    Color, Urine STRAW (*)    Specific Gravity, Urine 1.004 (*)    All other components within normal limits  CBC  CBG MONITORING, ED  I-STAT BETA HCG BLOOD, ED ([facility], [facility], [facility] ONLY)    EKG  EKG Interpretation  Date/Time:  Friday October 27 2016 20:05:46 EDT Ventricular Rate:  101 PR Interval:    QRS Duration: 83 QT Interval:  339 QTC Calculation: 440 R Axis:   2 Text Interpretation:  Sinus tachycardia Probable anteroseptal infarct, old Confirmed by Ranae Palms  MD, Falicity Sheets ([id number]) on 10/27/2016 8:16:36 PM       Radiology Ct Head Wo Contrast  Result Date: 10/27/2016 CLINICAL DATA:  Loss of consciousness. EXAM: CT HEAD WITHOUT CONTRAST TECHNIQUE: Contiguous axial images were obtained from the base of the skull through the vertex without intravenous contrast. COMPARISON:  None. FINDINGS: Brain: No evidence of acute infarction, hemorrhage, hydrocephalus, extra-axial collection or mass lesion/mass effect. Vascular: No hyperdense vessel or unexpected calcification. Skull: Normal. Negative for fracture or focal lesion. Sinuses/Orbits: No acute finding. Other: None. IMPRESSION: Normal head CT. Electronically Signed   By: Irish Lack M.D.   On: 10/27/2016 20:51    Procedures Procedures (including critical care time)  Medications Ordered in ED Medications  sodium chloride 0.9 % bolus 1,000 mL (0 mLs Intravenous Stopped 10/27/16 2153)  metoCLOPramide (REGLAN) injection 10 mg (10 mg Intravenous Given 10/27/16 2052)  ketorolac (TORADOL) 30 MG/ML  injection 30 mg (30 mg Intravenous Given 10/27/16 2200)  diphenhydrAMINE (BENADRYL) injection 25 mg (25 mg Intravenous Given 10/27/16 2200)  ondansetron (ZOFRAN) injection 4 mg (4 mg Intravenous Given 10/27/16 2200)     Initial Impression / Assessment and Plan / ED Course  I have reviewed the triage vital signs and the nursing notes.  Pertinent labs & imaging results that were available during my care of the patient were reviewed by me and considered in my medical decision making (see chart for details).     Patient states her headache is significantly improved. Continue to have a normal neurologic exam. CT head without acute findings. Patient states her grandmother had a history of POTS. She is advised to follow-up with cardiology. May  need Holter monitoring. Return precautions have been given and she is voiced understanding.  Final Clinical Impressions(s) / ED Diagnoses   Final diagnoses:  Syncope and collapse  Nonintractable headache, unspecified chronicity pattern, unspecified headache type    New Prescriptions New Prescriptions   No medications on file     Loren Racer, MD 10/27/16 2300

## 2016-10-27 NOTE — ED Notes (Signed)
Bed: [id number] Expected date:  Expected time:  Means of arrival:  Comments: EMS syncopal episode, HR 120s

## 2017-01-26 DIAGNOSIS — J45909 Unspecified asthma, uncomplicated: Secondary | ICD-10-CM | POA: Insufficient documentation

## 2017-02-14 ENCOUNTER — Other Ambulatory Visit: Payer: Self-pay | Admitting: Urology

## 2017-02-14 DIAGNOSIS — R1032 Left lower quadrant pain: Secondary | ICD-10-CM

## 2017-02-14 DIAGNOSIS — K5909 Other constipation: Secondary | ICD-10-CM

## 2017-02-20 ENCOUNTER — Other Ambulatory Visit: Payer: Self-pay

## 2017-02-26 ENCOUNTER — Other Ambulatory Visit: Payer: Self-pay

## 2017-05-14 ENCOUNTER — Encounter (HOSPITAL_COMMUNITY): Payer: Self-pay

## 2017-05-14 ENCOUNTER — Other Ambulatory Visit: Payer: Self-pay

## 2017-05-14 ENCOUNTER — Emergency Department (HOSPITAL_COMMUNITY)
Admission: EM | Admit: 2017-05-14 | Discharge: 2017-05-15 | Disposition: A | Discharge status: Home / Self Care | Payer: Medicaid Other | Attending: Emergency Medicine | Admitting: Emergency Medicine

## 2017-05-14 DIAGNOSIS — F17210 Nicotine dependence, cigarettes, uncomplicated: Secondary | ICD-10-CM | POA: Insufficient documentation

## 2017-05-14 DIAGNOSIS — M545 Low back pain: Secondary | ICD-10-CM | POA: Diagnosis present

## 2017-05-14 DIAGNOSIS — J45909 Unspecified asthma, uncomplicated: Secondary | ICD-10-CM | POA: Insufficient documentation

## 2017-05-14 DIAGNOSIS — Z79899 Other long term (current) drug therapy: Secondary | ICD-10-CM | POA: Insufficient documentation

## 2017-05-14 NOTE — ED Triage Notes (Signed)
Pt presents to the ed with complaints of severe pain in her left lower back with feeling like her left leg is freezing cold. Pulses strong and present.  Pt just and an RF procedure done on her nerves for pain and her pain is getting worse so her doctor told her to come here.

## 2017-05-14 NOTE — ED Provider Notes (Signed)
  Patient placed in Quick Look pathway, seen and evaluated for chief complaint of sciatica pain on the left and feeling like her left leg is going cold. She had a recent RF procedure for Sciatic[facility]   Pertinent H&P findings include Bilateral radial, posterior tibialis and dorsalis pedis pulses are intact.  Good capillary refill to her bilateral toes. No calf edema or TTP. Good ROM of her ankles and toes. Speech is clear and coherent, non-toxic appearing, no respiratory distress.    Based on initial evaluation, labs are not indicated and radiology studies are not indicated.  Patient counseled on process, plan, and necessity for staying for completing the evaluation.    Everlene FarrierDansie, Bertina Guthridge, PA-C 05/14/17 1601    Eber HongMiller, Brian, MD 05/15/17 1409

## 2017-05-15 ENCOUNTER — Emergency Department (HOSPITAL_COMMUNITY): Payer: Medicaid Other

## 2017-05-15 MED ORDER — KETOROLAC TROMETHAMINE 15 MG/ML IJ SOLN
15.0000 mg | Freq: Once | INTRAMUSCULAR | Status: AC
  Administered 2017-05-15: 15 mg via INTRAVENOUS
  Filled 2017-05-15: qty 1

## 2017-05-15 MED ORDER — HYDROMORPHONE HCL 1 MG/ML IJ SOLN: 0.5000 mg | Freq: Once | INTRAMUSCULAR | Status: DC

## 2017-05-15 MED ORDER — LORAZEPAM 2 MG/ML IJ SOLN
1.0000 mg | Freq: Once | INTRAMUSCULAR | Status: AC
  Administered 2017-05-15: 1 mg via INTRAVENOUS
  Filled 2017-05-15: qty 1

## 2017-05-15 MED ORDER — HYDROMORPHONE HCL 1 MG/ML IJ SOLN
1.0000 mg | Freq: Once | INTRAMUSCULAR | Status: AC
  Administered 2017-05-15: 1 mg via INTRAVENOUS
  Filled 2017-05-15: qty 1

## 2017-05-15 MED ORDER — MORPHINE SULFATE (PF) 4 MG/ML IV SOLN
4.0000 mg | Freq: Once | INTRAVENOUS | Status: AC
Start: 2017-05-15 — End: 2017-05-15
  Administered 2017-05-15: 4 mg via INTRAVENOUS
  Filled 2017-05-15: qty 1

## 2017-05-15 MED ORDER — ONDANSETRON HCL 4 MG/2ML IJ SOLN
4.0000 mg | Freq: Once | INTRAMUSCULAR | Status: AC
  Administered 2017-05-15: 4 mg via INTRAVENOUS
  Filled 2017-05-15: qty 2

## 2017-05-15 NOTE — Discharge Instructions (Signed)
You were seen in the emergency department today for back pain and left leg numbness.  Your MRI showed degenerative changes.  You to take your pain medication as prescribed at home.  Follow-up with your neurosurgeon as scheduled or sooner if your pain persist.  Return to the emergency department for any new or worsening symptoms or any other concerns you may have.

## 2017-05-15 NOTE — ED Provider Notes (Signed)
06:45: [facility] pending [facility] results.   Patient is a 37 year old female with a history of lumbar fusion followed by Dr. [facility] who presented to the emergency department for progressive worsening lower back pain which radiates down the left lower extremity.  Patient has baseline numbness to the left leg, new numbness to the medial calf of the left lower leg over the past several days.  She denies weakness.  Physical exam per previous PA with tenderness to palpation of midline and left sided lumbar regions as well as the left hip.  5 out of 5 right lower extremity strength, 4 out of 5 left lower extremity strength.  There was some decreased sensation to the left lower extremity as compared to the right lower extremity.  Mr Lumbar Spine Wo Contrast  Result Date: 05/15/2017 CLINICAL DATA:  37 year old female with worsening central and left side lumbar back pain radiating down the left leg. Prior lumbar fusion and lumbar pain treatment with radiofrequency ablation. New numbness of the left medial calf over the last several days. EXAM: MRI LUMBAR SPINE WITHOUT CONTRAST TECHNIQUE: Multiplanar, multisequence MR imaging of the lumbar spine was performed. No intravenous contrast was administered. COMPARISON:  CT Abdomen and Pelvis 02/22/2017. Lumbar radiographs 07/20/2016. Lumbar MRI 06/21/2016. FINDINGS: Segmentation: Normal on the comparison CT, which is the same numbering system used on the 2018 MRI. Alignment: Stable vertebral height and alignment. Chronic anterolisthesis of L5 on S1 with mildly exaggerated lower lumbar lordosis. Subtle retrolisthesis of L4 on L5. Vertebrae: Mild hardware susceptibility artifact at L5-S1. Background bone marrow signal is normal. Benign vertebral hemangioma in the posterior T12 body. No marrow edema or evidence of acute osseous abnormality. Intact visible sacrum and SI joints. Conus medullaris and cauda equina: Conus extends to the L1  level. Conus and cauda equina appear normal. Paraspinal and other soft tissues: Negative visible abdominal viscera. Stable postoperative changes to the lower posterior lumbar paraspinal soft tissues. No postoperative fluid collection. Disc levels: T11-T12: Negative. T12-L1:  Negative. L1-L2:  Negative. L2-L3: Borderline to mild facet hypertrophy is stable. Otherwise negative. L3-L4: Stable mild facet and ligament flavum hypertrophy. Trace degenerative facet joint fluid is stable. Negative disc. No stenosis. L4-L5: Mild circumferential disc bulge. Moderate bilateral facet hypertrophy. Capacious thecal sac. No spinal stenosis. Mild to moderate right L4 neural foraminal stenosis appears increased on series 4, image 3 and series 7, image 22. However, the left neural foramen appears stable without convincing stenosis. L5-S1: Prior decompression and fusion. Solid-appearing interbody arthrodesis here on the December CT. Capacious thecal sac. No spinal stenosis. No convincing foraminal stenosis. IMPRESSION: 1. Stable and satisfactory post-fusion appearance of L5-S1. 2. 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 This is related to a combination of facet hypertrophy and foraminal disc bulging. 3. However, there is no new or convincing left side neural impingement. Electronically Signed   By: Odessa FlemingH  Hall M.D.   On: 05/15/2017 07:10    MRI results reviewed. Will have patient continue her analgesics per pain management and follow up with her neurosurgeon. When discussing results patient' requesting dose of Toradol prior to DC- Toradol ordered. I discussed results, treatment plan, need for neurosurgery follow-up, and return precautions with the patient. Provided opportunity for questions, patient confirmed understanding and is in agreement with plan.      Desmond Lopeetrucelli, Wiley Magan R, PA-C 05/15/17 0750    Abelino DerrickMackuen, Courteney Lyn,  MD 05/18/17 2007

## 2017-05-15 NOTE — ED Provider Notes (Signed)
[facility] EMERGENCY DEPARTMENT Provider Note   CSN: [id number] Arrival date & time: 05/14/17  1502     History   Chief Complaint Chief Complaint  Patient presents with  . Leg Pain    HPI MIRENDA BALTAZAR is a 37 y.o. female.  The history is provided by the patient and medical records. No language interpreter was used.  Leg Pain   Associated symptoms include numbness.   SHANEKIA LATELLA is a 37 y.o. female  with a PMH as listed below who presents to the Emergency Department complaining of progressively worsening central and left-sided low back pain. Pain radiates down entire left leg. She feels as if her left leg is "ice cold". She has hx of lumbar fusion and sees Dr. Murray Hodgkins for RF procedures regularly for pain. She also goes to a pain management doctor. She has baseline numbness to the left leg, but reports new numbness to the medial calf of her left leg over the last several days. Denies weakness, but pain worse with movement or ambulation. Patient denies upper back or neck pain. No fever, saddle anesthesia, urinary complaints including retention/incontinence. No history of cancer or IVDU.  Past Medical History:  Diagnosis Date  . Asthma   . Bronchitis   . Dysrhythmia    PVCs- stress related  . Hypoglycemia   . PONV (postoperative nausea and vomiting)    pt prefers scop patch    Patient Active Problem List   Diagnosis Date Noted  . Stress incontinence in female 03/11/2014  . HYPOTENSION 03/18/2010  . ARM PAIN 04/28/2009  . ABNORMAL INVOLUNTARY MOVEMENTS 04/28/2009  . FACIAL PARESTHESIA 04/28/2009  . HEADACHE 04/26/2009  . WHEEZING 03/23/2009  . DIARRHEA, ACUTE, CHRONIC 02/19/2009  . HYDRONEPHROSIS, RIGHT 02/18/2009  . PYELONEPHRITIS, HX OF 02/18/2009  . MIGRAINES, HX OF 02/18/2009  . LUMBAR RADICULOPATHY, RIGHT 12/23/2008  . ASTHMATIC BRONCHITIS, ACUTE 01/22/2008  . BACK PAIN 01/22/2008  . ELEVATED BLOOD PRESSURE WITHOUT DIAGNOSIS OF HYPERTENSION  01/22/2008  . OBESITY, MORBID 05/29/2007  . INSOMNIA-SLEEP DISORDER-UNSPEC 05/29/2007  . HYPERSOMNIA 05/29/2007  . ANEMIA-NOS 01/01/2007  . ANXIETY 01/01/2007  . DEPRESSION 01/01/2007  . COMMON MIGRAINE 01/01/2007  . ASTHMA 01/01/2007  . SYNCOPE 01/01/2007    Past Surgical History:  Procedure Laterality Date  . ABDOMINAL HYSTERECTOMY    . BACK SURGERY  07/2009   spinal fusion L 5-S1  . BLADDER SUSPENSION N/A 03/11/2014   Procedure: TRANSVAGINAL TAPE (TVT) PROCEDURE;  Surgeon: Lavina Hamman, MD;  Location: [facility];  Service: Gynecology;  Laterality: N/A;  . CHOLECYSTECTOMY    . TONSILLECTOMY      OB History    No data available       Home Medications    Prior to Admission medications   Medication Sig Start Date End Date Taking? Authorizing Provider  cyclobenzaprine (FLEXERIL) 10 MG tablet Take 10 mg by mouth at bedtime.    Yes [provider]  docusate sodium (COLACE) 100 MG capsule Take 100 mg by mouth at bedtime.   Yes [provider]  EPIPEN 2-PAK 0.3 MG/0.3ML SOAJ injection Inject 0.3 mg into the muscle as needed (for allergic reaction).  10/04/16  Yes [provider]  meloxicam (MOBIC) 15 MG tablet Take 1 tablet by mouth at bedtime. 04/25/17  Yes [provider]  methylPREDNISolone (MEDROL DOSEPAK) 4 MG TBPK tablet Take 4-24 mg by mouth See admin instructions. Take 6 tablets on day then decrease by 1 tablet daily until all taken 05/10/17  Yes [provider]  oxyCODONE-acetaminophen (PERCOCET) 10-325 MG tablet Take 0.5-1 tablets by mouth every 6 (six) hours as needed for pain. 10/13/16  Yes [provider]  albuterol (PROVENTIL) (2.5 MG/3ML) 0.083% nebulizer solution Take 3 mLs (2.5 mg total) by nebulization every 6 (six) hours as needed for wheezing or shortness of breath. Patient not taking: Reported on 10/27/2016 05/01/13   Rodolph Bongorey, Evan S, MD  famotidine (PEPCID) 20 MG tablet Take 1 tablet (20 mg total) by mouth 2 (two)  times daily. Patient not taking: Reported on 05/15/2017 12/14/15   Marlon PelGreene, Tiffany, PA-C  fluconazole (DIFLUCAN) 150 MG tablet Take 1 tablet (150 mg total) by mouth daily. Patient not taking: Reported on 10/27/2016 06/09/16   Deatra Canterxford, William J, FNP  nystatin-triamcinolone [facility](MYCOLOG II) cream Apply to affected area daily Patient not taking: Reported on 10/27/2016 06/09/16   Deatra Canterxford, William J, FNP  ondansetron (ZOFRAN ODT) 4 MG disintegrating tablet Take 1 tablet (4 mg total) by mouth every 8 (eight) hours as needed for nausea. Patient not taking: Reported on 10/27/2016 11/02/15   Garlon HatchetSanders, Lisa M, PA-C  oxyCODONE-acetaminophen (PERCOCET/ROXICET) 5-325 MG tablet Take 1-2 tablets by mouth every 4 (four) hours as needed for severe pain. Patient not taking: Reported on 10/27/2016 03/03/15   Elpidio AnisUpstill, Shari, PA-C  predniSONE (DELTASONE) 20 MG tablet Take 1 tablet (20 mg total) by mouth daily. Prednisone dose pack directions:   5 tabs on day one, 4 tabs on day two, 3 tabs on day three, 2 tabs on day four, 1 tab on day five. Disp # 15  Marlon Peliffany Greene, PA-C Patient not taking: Reported on 10/27/2016 12/14/15   Marlon PelGreene, Tiffany, PA-C  promethazine (PHENERGAN) 25 MG tablet Take 1 tablet (25 mg total) by mouth every 6 (six) hours as needed for nausea. Patient not taking: Reported on 10/27/2016 03/05/15   Fayrene Helperran, Bowie, PA-C    Family History No family history on file.  Social History Social History   Tobacco Use  . Smoking status: Current Every Day Smoker    Packs/day: 0.50    Types: Cigarettes  . Smokeless tobacco: Never Used  Substance Use Topics  . Alcohol use: Yes    Comment: social  . Drug use: No     Allergies   Loratadine; Doxycycline; Ibuprofen; Sumatriptan; and Methocarbamol   Review of Systems Review of Systems  Musculoskeletal: Positive for arthralgias, back pain and myalgias.  Neurological: Positive for numbness. Negative for weakness.  All other systems reviewed and are  negative.    Physical Exam Updated Vital Signs BP 125/70 (BP Location: Left Arm)   Pulse 97   Temp 98 F (36.7 C)   Resp 16   Wt 98.9 kg (218 lb)   SpO2 98%   BMI 34.14 kg/m   Physical Exam  Constitutional: She is oriented to person, place, and time. She appears well-developed and well-nourished.  Neck:  No midline or paraspinal tenderness. Full ROM without pain.  Cardiovascular: Normal rate, regular rhythm, normal heart sounds and intact distal pulses.  Strong DP pulses bilaterally.  Pulmonary/Chest: Effort normal and breath sounds normal. No respiratory distress.  Abdominal: Soft. Bowel sounds are normal. She exhibits no distension. There is no tenderness.  Musculoskeletal:  Tenderness to palpation of midline and left-sided lumbar region as well as left hip. 5/5 muscle strength of RLE, 4/5 of LLE.  Neurological: She is alert and oriented to person, place, and time. She has normal reflexes.  Decreased sensation to LLE as compared to RLE.  Skin: Skin is warm and dry. No rash noted. No erythema.  Nursing note and vitals reviewed.    ED Treatments / Results  Labs (all labs ordered are listed, but only abnormal results are displayed) Labs Reviewed - No data to display  EKG  EKG Interpretation None       Radiology No results found.  Procedures Procedures (including critical care time)  Medications Ordered in ED Medications  LORazepam (ATIVAN) injection 1 mg (not administered)  morphine 4 MG/ML injection 4 mg (4 mg Intravenous Given 05/15/17 0156)  HYDROmorphone (DILAUDID) injection 1 mg (1 mg Intravenous Given 05/15/17 0425)  ondansetron (ZOFRAN) injection 4 mg (4 mg Intravenous Given 05/15/17 0433)     Initial Impression / Assessment and Plan / ED Course  I have reviewed the triage vital signs and the nursing notes.  Pertinent labs & imaging results that were available during my care of the patient were reviewed by me and considered in my medical decision  making (see chart for details).    AZARIA STEGMAN is a 37 y.o. female who presents to ED for worsening middle and left-sided low back pain.  History of prior lumbar fusion.  She does report baseline numbness to the left lower leg, but no new numbness to the medial left calf.  No bowel or bladder incontinence, fever or saddle anesthesia.  Slightly weaker on the left side compared to right, but pain may be contributory to this.  Will obtain MRI for further evaluation.   MRI pending at shift change. Care assumed by oncoming provider PA Petrucelli. Case discussed, plan agreed upon. Will follow up on pending MRI. If no acute findings, discharge to home to follow up with [facility].   Patient discussed with Dr. Blinda Leatherwood who agrees with treatment plan.    Final Clinical Impressions(s) / ED Diagnoses   Final diagnoses:  None    ED Discharge Orders    None       Ward, Chase Picket, PA-C 05/15/17 4540    Gilda Crease, MD 05/15/17 [phone]

## 2017-06-05 ENCOUNTER — Other Ambulatory Visit: Payer: Self-pay | Admitting: Neurosurgery

## 2017-06-05 DIAGNOSIS — M5416 Radiculopathy, lumbar region: Secondary | ICD-10-CM

## 2017-06-08 DIAGNOSIS — K6289 Other specified diseases of anus and rectum: Secondary | ICD-10-CM

## 2017-06-08 DIAGNOSIS — R109 Unspecified abdominal pain: Secondary | ICD-10-CM | POA: Insufficient documentation

## 2017-07-03 ENCOUNTER — Ambulatory Visit
Admission: RE | Admit: 2017-07-03 | Discharge: 2017-07-03 | Disposition: A | Discharge status: Home / Self Care | Payer: Medicaid Other | Source: Ambulatory Visit | Attending: Neurosurgery | Admitting: Neurosurgery

## 2017-07-03 DIAGNOSIS — M5416 Radiculopathy, lumbar region: Secondary | ICD-10-CM

## 2017-07-03 MED ORDER — IOPAMIDOL (ISOVUE-M 200) INJECTION 41%
15.0000 mL | Freq: Once | INTRAMUSCULAR | Status: AC
  Administered 2017-07-03: 15 mL via INTRATHECAL

## 2017-07-03 MED ORDER — DIAZEPAM 5 MG PO TABS: 10.0000 mg | ORAL_TABLET | Freq: Once | ORAL | Status: DC

## 2017-07-03 NOTE — Discharge Instructions (Signed)

## 2017-07-16 DIAGNOSIS — E782 Mixed hyperlipidemia: Secondary | ICD-10-CM | POA: Insufficient documentation

## 2017-10-24 DIAGNOSIS — I10 Essential (primary) hypertension: Secondary | ICD-10-CM | POA: Insufficient documentation

## 2018-01-14 DIAGNOSIS — M5126 Other intervertebral disc displacement, lumbar region: Secondary | ICD-10-CM | POA: Insufficient documentation

## 2018-08-20 ENCOUNTER — Encounter (HOSPITAL_COMMUNITY): Payer: Self-pay | Admitting: Emergency Medicine

## 2018-08-20 ENCOUNTER — Emergency Department (HOSPITAL_COMMUNITY): Payer: Medicaid Other

## 2018-08-20 ENCOUNTER — Emergency Department (HOSPITAL_COMMUNITY)
Admission: EM | Admit: 2018-08-20 | Discharge: 2018-08-20 | Disposition: A | Discharge status: Home / Self Care | Payer: Medicaid Other | Attending: Emergency Medicine | Admitting: Emergency Medicine

## 2018-08-20 DIAGNOSIS — W19XXXA Unspecified fall, initial encounter: Secondary | ICD-10-CM

## 2018-08-20 DIAGNOSIS — M545 Low back pain, unspecified: Secondary | ICD-10-CM

## 2018-08-20 DIAGNOSIS — Y9389 Activity, other specified: Secondary | ICD-10-CM | POA: Insufficient documentation

## 2018-08-20 DIAGNOSIS — Y92002 Bathroom of unspecified non-institutional (private) residence single-family (private) house as the place of occurrence of the external cause: Secondary | ICD-10-CM | POA: Insufficient documentation

## 2018-08-20 DIAGNOSIS — F17210 Nicotine dependence, cigarettes, uncomplicated: Secondary | ICD-10-CM | POA: Diagnosis not present

## 2018-08-20 DIAGNOSIS — Y999 Unspecified external cause status: Secondary | ICD-10-CM | POA: Diagnosis not present

## 2018-08-20 DIAGNOSIS — R51 Headache: Secondary | ICD-10-CM | POA: Diagnosis not present

## 2018-08-20 DIAGNOSIS — W0110XA Fall on same level from slipping, tripping and stumbling with subsequent striking against unspecified object, initial encounter: Secondary | ICD-10-CM | POA: Insufficient documentation

## 2018-08-20 DIAGNOSIS — Z79899 Other long term (current) drug therapy: Secondary | ICD-10-CM | POA: Diagnosis not present

## 2018-08-20 DIAGNOSIS — R519 Headache, unspecified: Secondary | ICD-10-CM

## 2018-08-20 MED ORDER — KETOROLAC TROMETHAMINE 15 MG/ML IJ SOLN
15.0000 mg | Freq: Once | INTRAMUSCULAR | Status: AC
  Administered 2018-08-20: 15 mg via INTRAMUSCULAR
  Filled 2018-08-20: qty 1

## 2018-08-20 MED ORDER — DEXAMETHASONE SODIUM PHOSPHATE 10 MG/ML IJ SOLN
10.0000 mg | Freq: Once | INTRAMUSCULAR | Status: AC
  Administered 2018-08-20: 10 mg via INTRAMUSCULAR
  Filled 2018-08-20: qty 1

## 2018-08-20 MED ORDER — LIDOCAINE 5 % EX PTCH: 1.0000 | MEDICATED_PATCH | CUTANEOUS | 0 refills | Status: DC

## 2018-08-20 MED ORDER — PREDNISONE 20 MG PO TABS: 40.0000 mg | ORAL_TABLET | Freq: Every day | ORAL | 0 refills | Status: AC

## 2018-08-20 NOTE — ED Notes (Signed)
Pt returned from CT °

## 2018-08-20 NOTE — ED Provider Notes (Signed)
[facility]ERGENC[facility]PART[facility] Provider Note   CSN: [id number] Arrival date & time: 08/20/18  1452    History   Chief Complaint No chief complaint on file.   HPI Angie Brown is a 38 y.o. adult presenting for evaluation of back pain.  Patient states she had surgery in 2011 with Dr. Jordan LikesPool for which she had an L4-L5 fusion.  2 days ago she tripped in her bathroom, falling face first.  She hit the front of her head.  Since then, she has had gradually worsening back pain, beginning at her incision site wrapping around her left hip.  Pain is been constant, worse with palpation or movement.  She has taken Percocet without improvement.  She has taken Flexeril at night with mild improvement.  She denies fevers, chills, nausea, vomiting, abdominal pain, urinary symptoms, loss of bowel bladder control, numbness, tingling, radiation down her leg, history of cancer, history of IVDU.  Patient also reports a frontal headache where she hit her head.  She denies vision changes, slurred speech, dizziness, lightheadedness, decreased concentration, neck pain. Pt states she is getting back injections on the L side, last injection 2 wks ago. She called Dr. Lindalou HosePool's office, and was told to come ot the ED for imaging and further evaluation. H/o asthma, htn, migraines obesity     HPI  Past Medical History:  Diagnosis Date  . Asthma   . Bronchitis   . Dysrhythmia    PVCs- stress related  . Hypoglycemia   . PONV (postoperative nausea and vomiting)    pt prefers scop patch    Patient Active Problem List   Diagnosis Date Noted  . Anal pain 06/08/2017  . Chronic constipation 02/14/2017  . Asthma 01/26/2017  . Anaphylaxis 09/29/2016  . Elevated glucose 09/29/2016  . Chronic pain 06/29/2016  . 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 Voiding dysfunction 03/08/2016  . Other insomnia 03/01/2016  . Elevated blood pressure reading 01/05/2016  . GAD (generalized anxiety  disorder) 01/05/2016  . Gastroesophageal reflux disease without esophagitis 01/05/2016  . Recurrent major depressive disorder (HCC) 01/05/2016  . Stress incontinence in female 03/11/2014  . HYPOTENSION 03/18/2010  . ARM PAIN 04/28/2009  . ABNORMAL INVOLUNTARY MOVEMENTS 04/28/2009  . FACIAL PARESTHESIA 04/28/2009  . HEADACHE 04/26/2009  . WHEEZING 03/23/2009  . DIARRHEA, ACUTE, CHRONIC 02/19/2009  . HYDRONEPHROSIS, RIGHT 02/18/2009  . PYELONEPHRITIS, HX OF 02/18/2009  . MIGRAINES, HX OF 02/18/2009  . LUMBAR RADICULOPATHY, RIGHT 12/23/2008  . ASTHMATIC BRONCHITIS, ACUTE 01/22/2008  . BACK PAIN 01/22/2008  . ELEVATED BLOOD PRESSURE WITHOUT DIAGNOSIS OF HYPERTENSION 01/22/2008  . OBESITY, MORBID 05/29/2007  . INSOMNIA-SLEEP DISORDER-UNSPEC 05/29/2007  . HYPERSOMNIA 05/29/2007  . ANEMIA-NOS 01/01/2007  . ANXIETY 01/01/2007  . DEPRESSION 01/01/2007  . COMMON MIGRAINE 01/01/2007  . ASTHMA 01/01/2007  . SYNCOPE 01/01/2007    Past Surgical History:  Procedure Laterality Date  . ABDOMINAL HYSTERECTOMY    . BACK SURGERY  07/2009   spinal fusion L 5-S1  . BLADDER SUSPENSION N/A 03/11/2014   Procedure: TRANSVAGINAL TAPE (TVT) PROCEDURE;  Surgeon: Lavina Hammanodd Meisinger, MD;  Location: [facility];  Service: Gynecology;  Laterality: N/A;  . CHOLECYSTECTOMY    . TONSILLECTOMY       OB History   No obstetric history on file.      Home Medications    Prior to Admission medications   Medication Sig Start Date End Date Taking? Authorizing Provider  cyclobenzaprine (FLEXERIL) 10 MG tablet Take 10 mg by mouth  at bedtime.     [provider]  docusate sodium (COLACE) 100 MG capsule Take 100 mg by mouth at bedtime.    [provider]  EPIPEN 2-PAK 0.3 MG/0.3ML SOAJ injection Inject 0.3 mg into the muscle as needed (for allergic reaction).  10/04/16   [provider]  lidocaine (LIDODERM) 5 % Place 1 patch onto the skin daily. Remove & Discard patch within 12 hours or as  directed by MD 08/20/18   Davell Beckstead, PA-C  meloxicam (MOBIC) 15 MG tablet Take 1 tablet by mouth at bedtime. 04/25/17   [provider]  methylPREDNISolone (MEDROL DOSEPAK) 4 MG TBPK tablet Take 4-24 mg by mouth See admin instructions. Take 6 tablets on day then decrease by 1 tablet daily until all taken 05/10/17   [provider]  oxyCODONE-acetaminophen (PERCOCET) 10-325 MG tablet Take 0.5-1 tablets by mouth every 6 (six) hours as needed for pain. 10/13/16   [provider]  predniSONE (DELTASONE) 20 MG tablet Take 2 tablets (40 mg total) by mouth daily for 4 days. 08/20/18 08/24/18  Karmel Patricelli, PA-C    Family History No family history on file.  Social History Social History   Tobacco Use  . Smoking status: Current Every Day Smoker    Packs/day: 0.50    Types: Cigarettes  . Smokeless tobacco: Never Used  Substance Use Topics  . Alcohol use: Yes    Comment: social  . Drug use: No     Allergies   Loratadine; Sumatriptan; Doxycycline; Ibuprofen; and Methocarbamol   Review of Systems Review of Systems  Musculoskeletal: Positive for back pain.  All other systems reviewed and are negative.    Physical Exam Updated Vital Signs BP (!) 143/81   Pulse 85   Temp 98.4 F (36.9 C) (Oral)   Resp 16   SpO2 100%   Physical Exam Vitals signs and nursing note reviewed.  Constitutional:      General: He is not in acute distress.    Appearance: He is well-developed.     Comments: Obese female in NAD  HENT:     Head: Normocephalic and atraumatic.     Comments: No obvious deformity or trauma to the head.  Tenderness palpation of the forehead. Eyes:     Extraocular Movements: Extraocular movements intact.     Conjunctiva/sclera: Conjunctivae normal.     Pupils: Pupils are equal, round, and reactive to light.  Neck:     Musculoskeletal: Normal range of motion and neck supple.     Comments: No ttp of midline c-spine. No step offs or deformities  Cardiovascular:     Rate and Rhythm: Normal rate and regular rhythm.     Pulses: Normal pulses.  Pulmonary:     Effort: Pulmonary effort is normal. No respiratory distress.     Breath sounds: Normal breath sounds. No wheezing.  Abdominal:     General: There is no distension.     Palpations: Abdomen is soft. There is no mass.     Tenderness: There is no abdominal tenderness. There is no guarding or rebound.  Musculoskeletal:        General: Tenderness present.     Comments: ttp midline lumbosacral back overlying incision.  Patient also with tenderness palpation of bilateral SI joint and low back musculature.  No obvious step-offs or deformities.  No tenderness palpation of the lateral trochanters.  Strength of lower extremities intact bilaterally.  Sensation intact bilaterally.  Good pedal pulses bilaterally.  No saddle  paresthesias.  Patient is ambulatory with pain.   Skin:    General: Skin is warm and dry.     Capillary Refill: Capillary refill takes less than 2 seconds.  Neurological:     Mental Status: He is alert and oriented to person, place, and time.      ED Treatments / Results  Labs (all labs ordered are listed, but only abnormal results are displayed) Labs Reviewed - No data to display  EKG None  Radiology Ct Head Wo Contrast  Result Date: 08/20/2018 CLINICAL DATA:  38 year old female status post fall at home two days ago. Previous lumbar fusion. EXAM: CT HEAD WITHOUT CONTRAST TECHNIQUE: Contiguous axial images were obtained from the base of the skull through the vertex without intravenous contrast. COMPARISON:  Head CT 10/27/2016 and earlier. FINDINGS: Brain: Normal cerebral volume. No midline shift, ventriculomegaly, mass effect, evidence of mass lesion, intracranial hemorrhage or evidence of cortically based acute infarction. Gray-white matter differentiation is within normal limits throughout the brain. Vascular: No suspicious intracranial vascular hyperdensity. Skull:  No acute osseous abnormality identified. Sinuses/Orbits: Paranasal sinuses and mastoids are stable and well pneumatized. Other: Visualized orbits and scalp soft tissues are within normal limits. IMPRESSION: Stable and normal noncontrast CT appearance of the brain. Electronically Signed   By: Odessa Fleming M.D.   On: 08/20/2018 17:20   Ct Lumbar Spine Wo Contrast  Result Date: 08/20/2018 CLINICAL DATA:  38 year old female status post fall at home two days ago. Previous lumbar fusion. EXAM: CT LUMBAR SPINE WITHOUT CONTRAST TECHNIQUE: Multidetector CT imaging of the lumbar spine was performed without intravenous contrast administration. Multiplanar CT image reconstructions were also generated. COMPARISON:  Lumbar radiographs 11/21/2017. Postoperative CT lumbar myelogram 07/03/2017. FINDINGS: Segmentation: Normal, same numbering used on the 2019 myelogram. Alignment: Stable lumbar lordosis and mild residual anterolisthesis of L5 on S1. Vertebrae: No acute osseous abnormality identified. Postoperative changes are described below. Intact visible sacrum and SI joints. Paraspinal and other soft tissues: Mild postoperative changes to the lower lumbar paraspinal soft tissues, otherwise negative. Disc levels: T11-T12: Negative. T12-L1:  Negative. L1-L2:  Negative. L2-L3: Minimal circumferential disc bulge appears stable. Otherwise negative. L3-L4:  Negative. L4-L5: Minimal circumferential disc bulge appears stable. Mild facet hypertrophy. Mild if any bilateral L4 foraminal stenosis is stable. L5-S1: Previous decompression and fusion with transpedicular and interbody hardware. Pedicle screws are intact without loosening. Evidence of interbody and left side posterior element arthrodesis is stable. IMPRESSION: 1. No acute osseous abnormality in the lumbar spine. 2. Stable compared to the 2019 CT myelogram. Prior decompression and fusion at L5-S1 with no adverse features. Mild adjacent segment disease at L4-L5 with mild if any  bilateral L4 foraminal stenosis. Electronically Signed   By: Odessa Fleming M.D.   On: 08/20/2018 17:25    Procedures Procedures (including critical care time)  Medications Ordered in ED Medications  ketorolac (TORADOL) 15 MG/ML injection 15 mg (15 mg Intramuscular Given 08/20/18 1723)  dexamethasone (DECADRON) injection 10 mg (10 mg Intramuscular Given 08/20/18 1932)     Initial Impression / Assessment and Plan / ED Course  I have reviewed the triage vital signs and the nursing notes.  Pertinent labs & imaging results that were available during my care of the patient were reviewed by me and considered in my medical decision making (see chart for details).        Patient presenting for evaluation of low back pain after a fall 2 days ago.  Additionally, having a headache.  Fall.  Physical exam reassuring, no obvious neurologic deficits.  I have low suspicion for spinal cord injury at this time, however as she has had a previous lumbar surgery, will obtain CT for further evaluation.  Also obtain CT of the head considering persistent headache 2 days after the injury.  Toradol for pain.  CT of the head negative for fracture, bleed, swelling, or other acute concerning intracranial abnormality.  CT of the spine also reassuring, no fracture or misalignment.  On reassessment, patient reports pain is improved with Toradol.  Discussed likely inflammatory response following a stretch/strain, will place patient on prednisone and treat symptomatically with muscle relaxers, lidocaine patches, follow-up with Dr. Jordan Likes.  Discussed findings and plan with patient, she is agreeable.  At this time, patient appears safe for discharge.  Return precautions given.  Patient states she understands and agrees to plan.   Final Clinical Impressions(s) / ED Diagnoses   Final diagnoses:  Acute midline low back pain without sciatica  Fall, initial encounter  Acute nonintractable headache, unspecified headache type    ED  Discharge Orders         Ordered    predniSONE (DELTASONE) 20 MG tablet  Daily     08/20/18 1843    lidocaine (LIDODERM) 5 %  Every 24 hours     08/20/18 1843           Alveria Apley, PA-C 08/20/18 2033    Charlynne Pander, MD 08/22/18 [phone]

## 2018-08-20 NOTE — Discharge Instructions (Addendum)
Take prednisone as prescribed, starting tomorrow.  Do not take other anti-inflammatories at the same time (Advil, Motrin, ibuprofen, Aleve). You may supplement with Tylenol if you need further pain control. Continue to take your flexeril as needed for muscle stiffness or soreness.  Use lidocaine patches as needed for pain.  Use heat to help relax the musculature. Do the back stretches listed in the paperwork. Follow up with your back doctor if pain is not improving with this treatment.  Return to the ER if you develop high fevers, numbness, loss of bowel or bladder control, or any new or concerning symptoms.

## 2018-08-20 NOTE — ED Notes (Signed)
Patient verbalizes understanding of discharge instructions. Opportunity for questioning and answers were provided. Armband removed by staff, pt discharged from ED. Prescriptions and follow up care reviewed.  

## 2018-08-20 NOTE — ED Triage Notes (Signed)
Pt here after falling in the bathroom on Sunday , pt had L4- L5 fusion in may by Dr Dutch Quint, called his office and was told to come to the ED to get a scan

## 2018-10-12 ENCOUNTER — Encounter (HOSPITAL_COMMUNITY): Payer: Self-pay | Admitting: Emergency Medicine

## 2018-10-12 ENCOUNTER — Other Ambulatory Visit: Payer: Self-pay

## 2018-10-12 ENCOUNTER — Observation Stay (HOSPITAL_COMMUNITY)
Admission: EM | Admit: 2018-10-12 | Discharge: 2018-10-13 | Disposition: A | Discharge status: Home / Self Care | Payer: Medicaid Other | Attending: Internal Medicine | Admitting: Internal Medicine

## 2018-10-12 DIAGNOSIS — T63461A Toxic effect of venom of wasps, accidental (unintentional), initial encounter: Secondary | ICD-10-CM | POA: Diagnosis not present

## 2018-10-12 DIAGNOSIS — Z0184 Encounter for antibody response examination: Secondary | ICD-10-CM | POA: Insufficient documentation

## 2018-10-12 DIAGNOSIS — X58XXXA Exposure to other specified factors, initial encounter: Secondary | ICD-10-CM | POA: Insufficient documentation

## 2018-10-12 DIAGNOSIS — J45909 Unspecified asthma, uncomplicated: Secondary | ICD-10-CM | POA: Insufficient documentation

## 2018-10-12 DIAGNOSIS — Z881 Allergy status to other antibiotic agents status: Secondary | ICD-10-CM | POA: Diagnosis not present

## 2018-10-12 DIAGNOSIS — Z791 Long term (current) use of non-steroidal anti-inflammatories (NSAID): Secondary | ICD-10-CM | POA: Insufficient documentation

## 2018-10-12 DIAGNOSIS — Z888 Allergy status to other drugs, medicaments and biological substances status: Secondary | ICD-10-CM | POA: Diagnosis not present

## 2018-10-12 DIAGNOSIS — T782XXA Anaphylactic shock, unspecified, initial encounter: Secondary | ICD-10-CM | POA: Diagnosis present

## 2018-10-12 DIAGNOSIS — F17210 Nicotine dependence, cigarettes, uncomplicated: Secondary | ICD-10-CM | POA: Insufficient documentation

## 2018-10-12 DIAGNOSIS — Z1159 Encounter for screening for other viral diseases: Secondary | ICD-10-CM | POA: Diagnosis not present

## 2018-10-12 DIAGNOSIS — Z886 Allergy status to analgesic agent status: Secondary | ICD-10-CM | POA: Diagnosis not present

## 2018-10-12 DIAGNOSIS — Z79899 Other long term (current) drug therapy: Secondary | ICD-10-CM | POA: Diagnosis not present

## 2018-10-12 LAB — BASIC METABOLIC PANEL
Anion gap: 7 (ref 5–15)
BUN: 6 mg/dL (ref 6–20)
CO2: 24 mmol/L (ref 22–32)
Calcium: 8.6 mg/dL — ABNORMAL LOW (ref 8.9–10.3)
Chloride: 107 mmol/L (ref 98–111)
Creatinine, Ser: 0.82 mg/dL (ref 0.44–1.00)
GFR calc Af Amer: >60 mL/min (ref >60)
GFR calc non Af Amer: >60 mL/min (ref >60)
Glucose, Bld: 131 mg/dL — ABNORMAL HIGH (ref 70–99)
Potassium: 4.2 mmol/L (ref 3.5–5.1)
Sodium: 138 mmol/L (ref 135–145)

## 2018-10-12 LAB — CBC WITH DIFFERENTIAL/PLATELET
Abs Immature Granulocytes: 0.03 10*3/uL (ref 0.00–0.07)
Basophils Absolute: 0.1 10*3/uL (ref 0.0–0.1)
Basophils Relative: 1 %
Eosinophils Absolute: 0.2 10*3/uL (ref 0.0–0.5)
Eosinophils Relative: 2 %
HCT: 39.4 % (ref 36.0–46.0)
Hemoglobin: 13.6 g/dL (ref 12.0–15.0)
Immature Granulocytes: 0 %
Lymphocytes Relative: 11 %
Lymphs Abs: 1 10*3/uL (ref 0.7–4.0)
MCH: 32.9 pg (ref 26.0–34.0)
MCHC: 34.5 g/dL (ref 30.0–36.0)
MCV: 95.4 fL (ref 80.0–100.0)
Monocytes Absolute: 0.3 10*3/uL (ref 0.1–1.0)
Monocytes Relative: 3 %
Neutro Abs: 7.5 10*3/uL (ref 1.7–7.7)
Neutrophils Relative %: 83 %
Platelets: 239 10*3/uL (ref 150–400)
RBC: 4.13 MIL/uL (ref 3.87–5.11)
RDW: 11.9 % (ref 11.5–15.5)
WBC: 9 10*3/uL (ref 4.0–10.5)
nRBC: 0 % (ref 0.0–0.2)

## 2018-10-12 LAB — SARS CORONAVIRUS 2 BY RT PCR (HOSPITAL ORDER, PERFORMED IN ~~LOC~~ HOSPITAL LAB): SARS Coronavirus 2: NEGATIVE

## 2018-10-12 MED ORDER — FAMOTIDINE 20 MG PO TABS
20.0000 mg | ORAL_TABLET | Freq: Two times a day (BID) | ORAL | Status: DC
  Administered 2018-10-13 (×2): 20 mg via ORAL
  Filled 2018-10-12 (×2): qty 1

## 2018-10-12 MED ORDER — FAMOTIDINE IN NACL 20-0.9 MG/50ML-% IV SOLN
20.0000 mg | Freq: Once | INTRAVENOUS | Status: AC
  Administered 2018-10-12: 20 mg via INTRAVENOUS
  Filled 2018-10-12: qty 50

## 2018-10-12 MED ORDER — DIPHENHYDRAMINE HCL 50 MG/ML IJ SOLN
50.0000 mg | Freq: Once | INTRAMUSCULAR | Status: AC
  Administered 2018-10-12: 21:00:00 50 mg via INTRAVENOUS
  Filled 2018-10-12: qty 1

## 2018-10-12 MED ORDER — DIPHENHYDRAMINE HCL 25 MG PO CAPS
25.0000 mg | ORAL_CAPSULE | Freq: Three times a day (TID) | ORAL | Status: DC
  Administered 2018-10-13 (×2): 25 mg via ORAL
  Filled 2018-10-12 (×2): qty 1

## 2018-10-12 MED ORDER — ENOXAPARIN SODIUM 40 MG/0.4ML ~~LOC~~ SOLN: 40.0000 mg | SUBCUTANEOUS | Status: DC

## 2018-10-12 MED ORDER — SODIUM CHLORIDE 0.9 % IV BOLUS
1000.0000 mL | Freq: Once | INTRAVENOUS | Status: AC
  Administered 2018-10-12: 1000 mL via INTRAVENOUS

## 2018-10-12 MED ORDER — PREDNISONE 20 MG PO TABS
60.0000 mg | ORAL_TABLET | Freq: Every day | ORAL | Status: DC
  Administered 2018-10-13: 60 mg via ORAL
  Filled 2018-10-12: qty 3

## 2018-10-12 NOTE — ED Provider Notes (Signed)
[facility]   CSN: [id number] Arrival date & time: 10/12/18  2008     History   Chief Complaint Chief Complaint  Patient presents with  . Allergic Reaction  . post CPR    HPI Linton FlemingsJennie L Charette is a 38 y.o. adult.     HPI   38 year old female with anaphylactic shock.  She states that she was unloading her car when she saw what she thought was a wasp or yellow jacket and it stung her on her right thigh.  Within minutes she developed diffuse itching and feeling lightheaded.  She then became unconscious.  Family administered an IM injection of epinephrine.  Patient remained unresponsive and they could not feel a pulse.  They did approximately 4 minutes of CPR prior to the fire department arriving.  They too did not feel any pulses and continued CPR for an additional 2 minutes before ROSC.  By the time EMS arrived was alert but hypotensive.  Blood pressures were in the 80s systolic.  She received an additional two injections of 0.743ml of 1:1000 epinephrine.  They noted wheezing.  She received 8 puffs and albuterol inhaler.  125 mg of Solu-Medrol.  500 cc normal saline and then they initiated an epinephrine drip.  Patient states that she is progressively felt better although she currently feels very weak/tired.  She states that her currently her breathing feels fine.  She has no GI complaints.  She reports known allergy to wasps. Past Medical History:  Diagnosis Date  . Asthma   . Bronchitis   . Dysrhythmia    PVCs- stress related  . Hypoglycemia   . PONV (postoperative nausea and vomiting)    pt prefers scop patch    Patient Active Problem List   Diagnosis Date Noted  . Anal pain 06/08/2017  . Chronic constipation 02/14/2017  . Asthma 01/26/2017  . Anaphylaxis 09/29/2016  . Elevated glucose 09/29/2016  . Chronic pain 06/29/2016  . 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 Voiding dysfunction 03/08/2016  . Other insomnia  03/01/2016  . Elevated blood pressure reading 01/05/2016  . GAD (generalized anxiety disorder) 01/05/2016  . Gastroesophageal reflux disease without esophagitis 01/05/2016  . Recurrent major depressive disorder (HCC) 01/05/2016  . Stress incontinence in female 03/11/2014  . HYPOTENSION 03/18/2010  . ARM PAIN 04/28/2009  . ABNORMAL INVOLUNTARY MOVEMENTS 04/28/2009  . FACIAL PARESTHESIA 04/28/2009  . HEADACHE 04/26/2009  . WHEEZING 03/23/2009  . DIARRHEA, ACUTE, CHRONIC 02/19/2009  . HYDRONEPHROSIS, RIGHT 02/18/2009  . PYELONEPHRITIS, HX OF 02/18/2009  . MIGRAINES, HX OF 02/18/2009  . LUMBAR RADICULOPATHY, RIGHT 12/23/2008  . ASTHMATIC BRONCHITIS, ACUTE 01/22/2008  . BACK PAIN 01/22/2008  . ELEVATED BLOOD PRESSURE WITHOUT DIAGNOSIS OF HYPERTENSION 01/22/2008  . OBESITY, MORBID 05/29/2007  . INSOMNIA-SLEEP DISORDER-UNSPEC 05/29/2007  . HYPERSOMNIA 05/29/2007  . ANEMIA-NOS 01/01/2007  . ANXIETY 01/01/2007  . DEPRESSION 01/01/2007  . COMMON MIGRAINE 01/01/2007  . ASTHMA 01/01/2007  . SYNCOPE 01/01/2007    Past Surgical History:  Procedure Laterality Date  . ABDOMINAL HYSTERECTOMY    . BACK SURGERY  07/2009   spinal fusion L 5-S1  . BLADDER SUSPENSION N/A 03/11/2014   Procedure: TRANSVAGINAL TAPE (TVT) PROCEDURE;  Surgeon: Lavina Hammanodd Meisinger, MD;  Location: [facility];  Service: Gynecology;  Laterality: N/A;  . CHOLECYSTECTOMY    . TONSILLECTOMY       OB History   No obstetric history on file.      Home Medications  Prior to Admission medications   Medication Sig Start Date End Date Taking? Authorizing Provider  cyclobenzaprine (FLEXERIL) 10 MG tablet Take 10 mg by mouth at bedtime.     [provider]  docusate sodium (COLACE) 100 MG capsule Take 100 mg by mouth at bedtime.    [provider]  EPIPEN 2-PAK 0.3 MG/0.3ML SOAJ injection Inject 0.3 mg into the muscle as needed (for allergic reaction).  10/04/16   [provider]  lidocaine (LIDODERM) 5  % Place 1 patch onto the skin daily. Remove & Discard patch within 12 hours or as directed by MD 08/20/18   Caccavale, [name], PA-C  meloxicam (MOBIC) 15 MG tablet Take 1 tablet by mouth at bedtime. 04/25/17   [provider]  methylPREDNISolone (MEDROL DOSEPAK) 4 MG TBPK tablet Take 4-24 mg by mouth See admin instructions. Take 6 tablets on day then decrease by 1 tablet daily until all taken 05/10/17   [provider]  oxyCODONE-acetaminophen (PERCOCET) 10-325 MG tablet Take 0.5-1 tablets by mouth every 6 (six) hours as needed for pain. 10/13/16   [provider]    Family History No family history on file.  Social History Social History   Tobacco Use  . Smoking status: Current Every Day Smoker    Packs/day: 0.50    Types: Cigarettes  . Smokeless tobacco: Never Used  Substance Use Topics  . Alcohol use: Yes    Comment: social  . Drug use: No     Allergies   Loratadine, Wasp venom, Sumatriptan, Doxycycline, Ibuprofen, and Methocarbamol   Review of Systems Review of Systems  All systems reviewed and negative, other than as noted in HPI.  Physical Exam Updated Vital Signs Ht 5\' 7"  (1.702 m)   Wt 113.4 kg   BMI 39.16 kg/m   Physical Exam Vitals signs and nursing note reviewed.  Constitutional:      General: He is not in acute distress.    Appearance: He is well-developed.  HENT:     Head: Normocephalic and atraumatic.  Eyes:     General:        Right eye: No discharge.        Left eye: No discharge.     Conjunctiva/sclera: Conjunctivae normal.  Neck:     Musculoskeletal: Neck supple.  Cardiovascular:     Rate and Rhythm: Normal rate and regular rhythm.     Heart sounds: Normal heart sounds. No murmur. No friction rub. No gallop.   Pulmonary:     Effort: Pulmonary effort is normal. No respiratory distress.     Breath sounds: Normal breath sounds.  Abdominal:     General: There is no distension.     Palpations: Abdomen is soft.      Tenderness: There is no abdominal tenderness.  Musculoskeletal:        General: No tenderness.  Skin:    General: Skin is warm and dry.     Findings: Rash present.     Comments: Diffuse urticarial rash. Lesion consistent with insect bite/sting to R thigh.   Neurological:     Mental Status: He is alert.  Psychiatric:        Behavior: Behavior normal.        Thought Content: Thought content normal.      ED Treatments / Results  Labs (all labs ordered are listed, but only abnormal results are displayed) Labs Reviewed  BASIC METABOLIC PANEL - Abnormal; Notable for the following components:  Result Value   Glucose, Bld 131 (*)    Calcium 8.6 (*)    All other components within normal limits  SARS CORONAVIRUS 2 (HOSPITAL ORDER, [place])  CBC WITH DIFFERENTIAL/PLATELET    EKG None  Radiology No results found.  Procedures Procedures (including critical care time)  Angiocath insertion Performed by: Virgel Manifold  Consent: Verbal consent obtained. Risks and benefits: risks, benefits and alternatives were discussed Time out: Immediately prior to procedure a "time out" was called to verify the correct patient, procedure, equipment, support staff and site/side marked as required.  Preparation: Patient was prepped and draped in the usual sterile fashion.  Vein Location: R AC  Ultrasound Guided  Gauge: 20 g  Normal blood return and flush without difficulty Patient tolerance: Patient tolerated the procedure well with no immediate complications.  CRITICAL CARE Performed by: Virgel Manifold Total critical care time: 35 minutes Critical care time was exclusive of separately billable procedures and treating other patients. Critical care was necessary to treat or prevent imminent or life-threatening deterioration. Critical care was time spent personally by me on the following activities: development of treatment plan with patient and/or surrogate  as well as nursing, discussions with consultants, evaluation of patient's response to treatment, examination of patient, obtaining history from patient or surrogate, ordering and performing treatments and interventions, ordering and review of laboratory studies, ordering and review of radiographic studies, pulse oximetry and re-evaluation of patient's condition.   Medications Ordered in ED Medications  famotidine (PEPCID) IVPB 20 mg premix (0 mg Intravenous Stopped 10/12/18 2131)  diphenhydrAMINE (BENADRYL) injection 50 mg (50 mg Intravenous Given 10/12/18 2043)  sodium chloride 0.9 % bolus 1,000 mL (1,000 mLs Intravenous New Bag/Given 10/12/18 2047)     Initial Impression / Assessment and Plan / ED Course  I have reviewed the triage vital signs and the nursing notes.  Pertinent labs & imaging results that were available during my care of the patient were reviewed by me and considered in my medical decision making (see chart for details).        38 year old female with anaphylaxis.  Exposure to known allergen.  Symptoms within a few minutes later.     Her symptoms have progressively improved and she has been hemodynamically stable thus far off off the epinephrine drip.  Given the fact that she had anaphylactic shock and cardiovascular collapse to the point of needing CPR for several minutes, will admit for observation.  Final Clinical Impressions(s) / ED Diagnoses   Final diagnoses:  Anaphylactic shock    ED Discharge Orders    None       Virgel Manifold, MD 10/15/18 2111

## 2018-10-12 NOTE — H&P (Signed)
History and Physical  Angie Brown [id number] DOB: 02-14-1981 DOA: 10/12/2018  Referring physician: ER provider PCP: Laqueta DueFurr, Sara M., MD  Outpatient Specialists:    Patient coming from: Home  Chief Complaint: Anaphylaxis with pulselessness  HPI:  38 year old female past medical history significant for severe allergic reaction to wasp, arrhythmia, asthma, hypoglycemia and postoperative nausea vomiting.  According to the patient, she experience a reaction to wasp for the first time about a year ago.  Patient was referred to an allergist, but could not tolerate [place] desensitization.  Whilst removing logs of wood from the deck earlier today, the patient got stung by a wasp on the right thigh and developed severe anaphylactic reaction with reported pulselessness for about 6 minutes.  EpiPen was administered by patient's number after patient was stung by wasp, and EMS continued epi drip on arrival.  Pulselessness has resolved.  Patient is back to her baseline.  Hospitalist team has been asked to observe patient overnight.  No headache, no neck pain, no fever or chills, no shortness of breath, no chest tightness, no GI symptoms and no urinary symptoms.  ED Course: On presentation to the hospital, patient was continued on epi drip for a while.  Temperature on presentation was 98.7, blood pressure of 103/59, respiratory rate of 16, heart rate of 83 bpm and O2 sat of 98%.  CBC done revealed WBC of 9, hemoglobin of 13.6, hematocrit of 39.4, MCV of 95.4 with platelet count of 239.  COVID-19 testing is still pending.  Chemistry reveals sodium of 138, potassium of 4.2, chloride 107, CO2 24, BUN of 6 and creatinine of 0.80 with blood sugar of 131.   Pertinent labs: As documented above.  Review of Systems:  Negative for fever, visual changes, sore throat, new muscle aches, chest pain, SOB, dysuria, bleeding, n/v/abdominal pain.  Past Medical History:  Diagnosis Date   Asthma    Bronchitis     Dysrhythmia    PVCs- stress related   Hypoglycemia    PONV (postoperative nausea and vomiting)    pt prefers scop patch    Past Surgical History:  Procedure Laterality Date   ABDOMINAL HYSTERECTOMY     BACK SURGERY  07/2009   spinal fusion L 5-S1   BLADDER SUSPENSION N/A 03/11/2014   Procedure: TRANSVAGINAL TAPE (TVT) PROCEDURE;  Surgeon: Lavina Hammanodd Meisinger, MD;  Location: [facility];  Service: Gynecology;  Laterality: N/A;   CHOLECYSTECTOMY     TONSILLECTOMY       reports that he has been smoking cigarettes. He has been smoking about 0.50 packs per day. He has never used smokeless tobacco. He reports current alcohol use. He reports that he does not use drugs.  Allergies  Allergen Reactions   Loratadine Shortness Of Breath, Swelling and Palpitations    hypoventilation    Wasp Venom Anaphylaxis   Sumatriptan Other (See Comments)    sinus pain, sinus on fire and face hurts   Doxycycline Nausea And Vomiting   Ibuprofen Nausea And Vomiting    GI upset   Methocarbamol Rash    No family history on file.   Prior to Admission medications   Medication Sig Start Date End Date Taking? Authorizing Provider  cyclobenzaprine (FLEXERIL) 10 MG tablet Take 10 mg by mouth at bedtime.     [provider]  docusate sodium (COLACE) 100 MG capsule Take 100 mg by mouth at bedtime.    [provider]  EPIPEN 2-PAK 0.3 MG/0.3ML SOAJ injection Inject 0.3 mg into the  muscle as needed (for allergic reaction).  10/04/16   [provider]  lidocaine (LIDODERM) 5 % Place 1 patch onto the skin daily. Remove & Discard patch within 12 hours or as directed by MD 08/20/18   Caccavale, [name], PA-C  meloxicam (MOBIC) 15 MG tablet Take 1 tablet by mouth at bedtime. 04/25/17   [provider]  methylPREDNISolone (MEDROL DOSEPAK) 4 MG TBPK tablet Take 4-24 mg by mouth See admin instructions. Take 6 tablets on day then decrease by 1 tablet daily until all taken 05/10/17    [provider]  oxyCODONE-acetaminophen (PERCOCET) 10-325 MG tablet Take 0.5-1 tablets by mouth every 6 (six) hours as needed for pain. 10/13/16   [provider]    Physical Exam: Vitals:   10/12/18 2045 10/12/18 2100 10/12/18 2115 10/12/18 2145  BP:  109/67 106/68 109/74  Pulse:      Resp:  17 (!) 22 16  Temp: 98.7 F (37.1 C)     TempSrc: Oral     SpO2:  98% 98%   Weight:      Height:        Constitutional:   Appears calm and comfortable.  Patient is morbidly obese. Eyes:   No pallor. No jaundice.  ENMT:   external ears, nose appear normal Neck:   Neck is supple. No JVD Respiratory:   CTA bilaterally, no w/r/r.   Respiratory effort normal. No retractions or accessory muscle use Cardiovascular:   S1S2  No LE extremity edema   Abdomen:   Abdomen is soft and non tender. Organs are difficult to assess. Neurologic:   Awake and alert.  Moves all limbs.  Wt Readings from Last 3 Encounters:  10/12/18 113.4 kg  05/14/17 98.9 kg  10/27/16 98.9 kg    I have personally reviewed following labs and imaging studies  Labs on Admission:  CBC: Recent Labs  Lab 10/12/18 2115  WBC 9.0  NEUTROABS 7.5  HGB 13.6  HCT 39.4  MCV 95.4  PLT 239   Basic Metabolic Panel: Recent Labs  Lab 10/12/18 2115  NA 138  K 4.2  CL 107  CO2 24  GLUCOSE 131*  BUN 6  CREATININE 0.82  CALCIUM 8.6*   Liver Function Tests: No results for input(s): AST, ALT, ALKPHOS, BILITOT, PROT, ALBUMIN in the last 168 hours. No results for input(s): LIPASE, AMYLASE in the last 168 hours. No results for input(s): AMMONIA in the last 168 hours. Coagulation Profile: No results for input(s): INR, PROTIME in the last 168 hours. Cardiac Enzymes: No results for input(s): CKTOTAL, CKMB, CKMBINDEX, TROPONINI in the last 168 hours. BNP (last 3 results) No results for input(s): PROBNP in the last 8760 hours. HbA1C: No results for input(s): HGBA1C in the last 72  hours. CBG: No results for input(s): GLUCAP in the last 168 hours. Lipid Profile: No results for input(s): CHOL, HDL, LDLCALC, TRIG, CHOLHDL, LDLDIRECT in the last 72 hours. Thyroid Function Tests: No results for input(s): TSH, T4TOTAL, FREET4, T3FREE, THYROIDAB in the last 72 hours. Anemia Panel: No results for input(s): VITAMINB12, FOLATE, FERRITIN, TIBC, IRON, RETICCTPCT in the last 72 hours. Urine analysis:    Component Value Date/Time   COLORURINE STRAW (A) 10/27/2016 1931   APPEARANCEUR CLEAR 10/27/2016 1931   LABSPEC 1.004 (L) 10/27/2016 1931   PHURINE 8.0 10/27/2016 1931   GLUCOSEU NEGATIVE 10/27/2016 1931   GLUCOSEU NEGATIVE 05/29/2007 1430   HGBUR NEGATIVE 10/27/2016 1931   HGBUR negative 12/23/2008 1131   BILIRUBINUR NEGATIVE 10/27/2016  [place] 10/27/2016 1931   PROTEINUR NEGATIVE 10/27/2016 1931   UROBILINOGEN 0.2 05/19/2013 2319   NITRITE NEGATIVE 10/27/2016 1931   LEUKOCYTESUR NEGATIVE 10/27/2016 1931   Sepsis Labs: @LABRCNTIP (procalcitonin:4,lacticidven:4) )No results found for this or any previous visit (from the past 240 hour(s)).    Radiological Exams on Admission: No results found.  Active Problems:   * No active hospital problems. *   Assessment/Plan Severe anaphylactic reaction to wasp sting with pulselessness: Observe patient overnight.   Epinephrine drip has been discontinued. Continue oral steroids, Pepcid and Benadryl. PCP to consider referral to allergist on discharge. Further management will depend on hospital course.   DVT prophylaxis: Subcutaneous Lovenox Code Status: Full code Family Communication:  Disposition Plan: Likely home tomorrow Consults called: None Admission status: Observation  Time spent: 50 minutes  Dana Allan, MD  [facility] Pager #: [phone] 7PM-7AM contact night coverage as above  10/12/2018, 10:03 PM

## 2018-10-12 NOTE — ED Triage Notes (Addendum)
Per EMS, pt was stung by a wasp, they gave 1 expired EPI pen.  Family did 4 min of CPR, fire arrived found her not breathing and no pulse did two more min of CPR, pt regained pulses and became alert and oriented.  In route pulses were low, given: Total of .6 EPI, 8 puffs of her inhaler.  125 solumedrol, 500 NS, and started epi drip (30ml given).  It should be noted that pt did take her blood pressure medication this evening prior to being stung

## 2018-10-12 NOTE — ED Notes (Signed)
ED TO INPATIENT HANDOFF REPORT  ED Nurse Name and Phone #: Tray SwazilandJordan, [phone]  S Name/Age/Gender Angie Brown 38 y.o. adult Room/Bed: RESUSC/RESUSC  Code Status   Code Status: Not on file  Home/SNF/Other Home Patient oriented to: self, place, time and situation Is this baseline? Yes   Triage Complete: Triage complete  Chief Complaint ALLERGIC REACTION  Triage Note Per EMS, pt was stung by a wasp, they gave 1 expired EPI pen.  Family did 4 min of CPR, fire arrived found her not breathing and no pulse did two more min of CPR, pt regained pulses and became alert and oriented.  In route pulses were low, given: Total of .6 EPI, 8 puffs of her inhaler.  125 solumedrol, 500 NS, and started epi drip (30ml given).  It should be noted that pt did take her blood pressure medication this evening prior to being stung   Allergies Allergies  Allergen Reactions  . Loratadine Shortness Of Breath, Swelling and Palpitations    hypoventilation   . Wasp Venom Anaphylaxis  . Sumatriptan Other (See Comments)    sinus pain, sinus on fire and face hurts  . Doxycycline Nausea And Vomiting  . Ibuprofen Nausea And Vomiting    GI upset  . Methocarbamol Rash    Level of Care/Admitting Diagnosis ED Disposition    ED Disposition Condition Comment   Admit  [facility]ospital [facility] [[id number]]  Level of Care: Telemetry Medical [104]  I expect the patient will be discharged within 24 hours: No (not a candidate for 5C-Observation unit)  Covid Evaluation: Asymptomatic Screening Protocol (No Symptoms)  Diagnosis: Anaphylactic reaction to wasp sting [454098][341631]  Admitting Physician: Barnetta ChapelGBATA, SYLVESTER I [[id number]]  Attending Physician: Berton MountGBATA, SYLVESTER I [[id number]]  PT Class (Do Not Modify): Observation [104]  PT Acc Code (Do Not Modify): Observation [10022]       B Medical/Surgery History Past Medical History:  Diagnosis Date  . Asthma   . Bronchitis   . Dysrhythmia    PVCs- stress  related  . Hypoglycemia   . PONV (postoperative nausea and vomiting)    pt prefers scop patch   Past Surgical History:  Procedure Laterality Date  . ABDOMINAL HYSTERECTOMY    . BACK SURGERY  07/2009   spinal fusion L 5-S1  . BLADDER SUSPENSION N/A 03/11/2014   Procedure: TRANSVAGINAL TAPE (TVT) PROCEDURE;  Surgeon: Lavina Hammanodd Meisinger, MD;  Location: [facility];  Service: Gynecology;  Laterality: N/A;  . CHOLECYSTECTOMY    . TONSILLECTOMY       A IV Location/Drains/Wounds Patient Lines/Drains/Airways Status   Active Line/Drains/Airways    Name:   Placement date:   Placement time:   Site:   Days:   Peripheral IV 10/12/18 Right Antecubital   10/12/18    2041    Antecubital   less than 1          Intake/Output Last 24 hours No intake or output data in the 24 hours ending 10/12/18 2317  Labs/Imaging Results for orders placed or performed during the hospital encounter of 10/12/18 (from the past 48 hour(s))  SARS Coronavirus 2 (CEPHEID - Performed in [facility] hospital lab), Hosp Order     Status: None   Collection Time: 10/12/18  9:02 PM   Specimen: Nasopharyngeal Swab  Result Value Ref Range   SARS Coronavirus 2 NEGATIVE NEGATIVE    Comment: (NOTE) If result is NEGATIVE SARS-CoV-2 target nucleic acids are NOT DETECTED. The SARS-CoV-2 RNA is generally detectable  in upper and lower  respiratory specimens during the acute phase of infection. The lowest  concentration of SARS-CoV-2 viral copies this assay can detect is 250  copies / mL. A negative result does not preclude SARS-CoV-2 infection  and should not be used as the sole basis for treatment or other  patient management decisions.  A negative result may occur with  improper specimen collection / handling, submission of specimen other  than nasopharyngeal swab, presence of viral mutation(s) within the  areas targeted by this assay, and inadequate number of viral copies  (<250 copies / mL). A negative result must be combined with  clinical  observations, patient history, and epidemiological information. If result is POSITIVE SARS-CoV-2 target nucleic acids are DETECTED. The SARS-CoV-2 RNA is generally detectable in upper and lower  respiratory specimens dur ing the acute phase of infection.  Positive  results are indicative of active infection with SARS-CoV-2.  Clinical  correlation with patient history and other diagnostic information is  necessary to determine patient infection status.  Positive results do  not rule out bacterial infection or co-infection with other viruses. If result is PRESUMPTIVE POSTIVE SARS-CoV-2 nucleic acids MAY BE PRESENT.   A presumptive positive result was obtained on the submitted specimen  and confirmed on repeat testing.  While 2019 novel coronavirus  (SARS-CoV-2) nucleic acids may be present in the submitted sample  additional confirmatory testing may be necessary for epidemiological  and / or clinical management purposes  to differentiate between  SARS-CoV-2 and other Sarbecovirus currently known to infect humans.  If clinically indicated additional testing with an alternate test  methodology [phone]([id number]) is advised. The SARS-CoV-2 RNA is generally  detectable in upper and lower respiratory sp ecimens during the acute  phase of infection. The expected result is Negative. Fact Sheet for Patients:  BoilerBrush.com.cyhttps://www.fda.gov/media/136312/download Fact Sheet for Healthcare Providers: https://pope.com/https://www.fda.gov/media/136313/download This test is not yet approved or cleared by the [place] FDA and has been authorized for detection and/or diagnosis of SARS-CoV-2 by FDA under an Emergency Use Authorization (EUA).  This EUA will remain in effect (meaning this test can be used) for the duration of the COVID-19 declaration under Section 564(b)(1) of the Act, 21 U.S.C. section 360bbb-3(b)(1), unless the authorization is terminated or revoked sooner. Performed at [facility], [address] [place], [place] [address]   CBC with Differential     Status: None   Collection Time: 10/12/18  9:15 PM  Result Value Ref Range   WBC 9.0 4.0 - 10.5 K/uL   RBC 4.13 3.87 - 5.11 MIL/uL   Hemoglobin 13.6 12.0 - 15.0 g/dL   HCT 81.139.4 91.436.0 - 78.246.0 %   MCV 95.4 80.0 - 100.0 fL   MCH 32.9 26.0 - 34.0 pg   MCHC 34.5 30.0 - 36.0 g/dL   RDW 95.611.9 21.311.5 - 08.615.5 %   Platelets 239 150 - 400 K/uL   nRBC 0.0 0.0 - 0.2 %   Neutrophils Relative % 83 %   Neutro Abs 7.5 1.7 - 7.7 K/uL   Lymphocytes Relative 11 %   Lymphs Abs 1.0 0.7 - 4.0 K/uL   Monocytes Relative 3 %   Monocytes Absolute 0.3 0.1 - 1.0 K/uL   Eosinophils Relative 2 %   Eosinophils Absolute 0.2 0.0 - 0.5 K/uL   Basophils Relative 1 %   Basophils Absolute 0.1 0.0 - 0.1 K/uL   Immature Granulocytes 0 %   Abs Immature Granulocytes 0.03 0.00 - 0.07 K/uL  Comment: Performed at [facility], [place] [address], [place], [place] [address]  Basic metabolic panel     Status: Abnormal   Collection Time: 10/12/18  9:15 PM  Result Value Ref Range   Sodium 138 135 - 145 mmol/L   Potassium 4.2 3.5 - 5.1 mmol/L   Chloride 107 98 - 111 mmol/L   CO2 24 22 - 32 mmol/L   Glucose, Bld 131 (H) 70 - 99 mg/dL   BUN 6 6 - 20 mg/dL   Creatinine, Ser 0.82 0.44 - 1.00 mg/dL   Calcium 8.6 (L) 8.9 - 10.3 mg/dL   GFR calc non Af Amer >60 >60 mL/min   GFR calc Af Amer >60 >60 mL/min   Anion gap 7 5 - 15    Comment: Performed at [facility], [place] [address] [place], [place] [address]   No results found.  Pending Labs [employer] (From admission, onward)    Start     Ordered   Signed and Held  HIV antibody (Routine Testing)  Once,   R     Signed and Held   Signed and Held  CBC  (enoxaparin (LOVENOX)    CrCl >/= 30 ml/min)  Once,   R    Comments: Baseline for enoxaparin therapy IF NOT ALREADY DRAWN.  Notify MD if PLT < 100 K.    Signed and Held   Signed and Held  Creatinine, serum  (enoxaparin (LOVENOX)    CrCl >/= 30 ml/min)  Once,    R    Comments: Baseline for enoxaparin therapy IF NOT ALREADY DRAWN.    Signed and Held   Signed and Held  Creatinine, serum  (enoxaparin (LOVENOX)    CrCl >/= 30 ml/min)  Weekly,   R    Comments: while on enoxaparin therapy    Signed and Held   Signed and Held  Basic metabolic panel  Tomorrow morning,   R     Signed and Held   Signed and Held  CBC  Tomorrow morning,   R     Signed and Held          Vitals/Pain Today's Vitals   10/12/18 2145 10/12/18 2200 10/12/18 2232 10/12/18 2233  BP: 109/74 101/67 (!) 126/57   Pulse:      Resp: 16 18  18   Temp:      TempSrc:      SpO2:    97%  Weight:      Height:      PainSc:        Isolation Precautions No active isolations  Medications Medications  famotidine (PEPCID) IVPB 20 mg premix (0 mg Intravenous Stopped 10/12/18 2131)  diphenhydrAMINE (BENADRYL) injection 50 mg (50 mg Intravenous Given 10/12/18 2043)  sodium chloride 0.9 % bolus 1,000 mL (1,000 mLs Intravenous New Bag/Given 10/12/18 2047)    Mobility walks High fall risk(post CPR)   Focused Assessments Cardiac Assessment Handoff:    Lab Results  Component Value Date   CKTOTAL 44 08/14/2009   CKMB 0.5 08/14/2009   TROPONINI <0.01        NO INDICATION OF MYOCARDIAL INJURY. 08/14/2009   No results found for: DDIMER Does the Patient currently have chest pain? No     R Recommendations: See Admitting Provider Note  Report given to:   Additional Notes:

## 2018-10-12 NOTE — ED Notes (Signed)
RN found pt had removed all monitoring equipment.  RN reattached and educated pt about the importance monitoring.

## 2018-10-12 NOTE — ED Notes (Signed)
Admitting provider bedside 

## 2018-10-12 NOTE — ED Notes (Signed)
Pt asked me where her grandfather went, "he was just here beside my bed".  RN explained there there has not been anybody in the room.  She then asked me "did you get everything you needed at the grocery store."  RN re-oriented pt, she was able to tell RN name, date, location and why she was in the ED.  Will continue to monitor.

## 2018-10-13 ENCOUNTER — Other Ambulatory Visit: Payer: Self-pay

## 2018-10-13 DIAGNOSIS — T782XXA Anaphylactic shock, unspecified, initial encounter: Secondary | ICD-10-CM | POA: Diagnosis not present

## 2018-10-13 LAB — BASIC METABOLIC PANEL
Anion gap: 9 (ref 5–15)
BUN: 6 mg/dL (ref 6–20)
CO2: 22 mmol/L (ref 22–32)
Calcium: 8.6 mg/dL — ABNORMAL LOW (ref 8.9–10.3)
Chloride: 104 mmol/L (ref 98–111)
Creatinine, Ser: 0.94 mg/dL (ref 0.44–1.00)
GFR calc Af Amer: >60 mL/min (ref >60)
GFR calc non Af Amer: >60 mL/min (ref >60)
Glucose, Bld: 231 mg/dL — ABNORMAL HIGH (ref 70–99)
Potassium: 4 mmol/L (ref 3.5–5.1)
Sodium: 135 mmol/L (ref 135–145)

## 2018-10-13 LAB — CBC
HCT: 39.3 % (ref 36.0–46.0)
Hemoglobin: 13.4 g/dL (ref 12.0–15.0)
MCH: 32.6 pg (ref 26.0–34.0)
MCHC: 34.1 g/dL (ref 30.0–36.0)
MCV: 95.6 fL (ref 80.0–100.0)
Platelets: 273 10*3/uL (ref 150–400)
RBC: 4.11 MIL/uL (ref 3.87–5.11)
RDW: 11.9 % (ref 11.5–15.5)
WBC: 8.4 10*3/uL (ref 4.0–10.5)
nRBC: 0 % (ref 0.0–0.2)

## 2018-10-13 LAB — HIV ANTIBODY (ROUTINE TESTING W REFLEX): HIV Screen 4th Generation wRfx: NONREACTIVE

## 2018-10-13 MED ORDER — PREDNISONE 10 MG PO TABS: ORAL_TABLET | ORAL | 0 refills | Status: DC

## 2018-10-13 MED ORDER — ACETAMINOPHEN 325 MG PO TABS: 650.0000 mg | ORAL_TABLET | Freq: Four times a day (QID) | ORAL | Status: DC | PRN

## 2018-10-13 MED ORDER — EPIPEN 2-PAK 0.3 MG/0.3ML IJ SOAJ: 0.3000 mg | INTRAMUSCULAR | 0 refills | Status: DC | PRN

## 2018-10-13 MED ORDER — FEXOFENADINE HCL 60 MG PO TABS: 60.0000 mg | ORAL_TABLET | Freq: Two times a day (BID) | ORAL | 0 refills | Status: DC

## 2018-10-13 MED ORDER — HYDROCODONE-ACETAMINOPHEN 5-325 MG PO TABS
1.0000 | ORAL_TABLET | Freq: Four times a day (QID) | ORAL | Status: DC | PRN
  Administered 2018-10-13: 2 via ORAL
  Filled 2018-10-13: qty 2

## 2018-10-13 MED ORDER — FAMOTIDINE 20 MG PO TABS: 20.0000 mg | ORAL_TABLET | Freq: Every day | ORAL | 0 refills | Status: DC

## 2018-10-13 NOTE — Discharge Summary (Signed)
[facility] Discharge Summary   Patient: Angie Brown ZOX:[id number]   PCP: Laqueta DueFurr, Angie M., Angie Brown DOB: Jul 30, 1980   Date of admission: 10/12/2018   Date of discharge:  10/13/2018    Discharge Diagnoses:  Active Problems:   Anaphylactic reaction to wasp sting  Admitted From: home Disposition:  Home   Recommendations for Outpatient Follow-up:  1. Please follow up with PCP in 2 week   Follow-up Information    Laqueta DueFurr, Angie M., Angie Brown. Schedule an appointment as soon as possible for a visit in 2 week(s).   Specialty: Internal Medicine Contact information: [address] [place] [address] [phone]          Diet recommendation: cardiac diet  Activity: The patient is advised to gradually reintroduce usual activities,as tolerated .  Discharge Condition: good  Code Status: full code  History of present illness: As per the H and P dictated on admission, "38 year old female past medical history significant for severe allergic reaction to wasp, arrhythmia, asthma, hypoglycemia and postoperative nausea vomiting.  According to the patient, she experience a reaction to wasp for the first time about a year ago.  Patient was referred to an allergist, but could not tolerate [place] desensitization.  Whilst removing logs of wood from the deck earlier today, the patient got stung by a wasp on the right thigh and developed severe anaphylactic reaction with reported pulselessness for about 6 minutes.  EpiPen was administered by patient's number after patient was stung by wasp, and EMS continued epi drip on arrival.  Pulselessness has resolved.  Patient is back to her baseline.  Hospitalist team has been asked to observe patient overnight.  No headache, no neck pain, no fever or chills, no shortness of breath, no chest tightness, no GI symptoms and no urinary symptoms."  Hospital Course:  Summary of his active problems in the hospital is as following. anaphylactic shock and  cardiovascular collapse Severe anaphylactic reaction to wasp sting with pulselessness: Her Epipen was expired.  CPR for several minutes Observed patient overnight.  no issues  Epinephrine drip has been discontinued. Continue oral steroids, Pepcid and Benadryl. PCP to consider referral to allergist on discharge.  Patient was ambulatory without any assistance. On the day of the discharge the patient's vitals were stable, and no other acute medical condition were reported by patient. the patient was felt safe to be discharge at Home with no therapy needed on discharge.  Consultants: none Procedures: none  DISCHARGE MEDICATION: Allergies as of 10/13/2018      Reactions   Loratadine Shortness Of Breath, Swelling, Palpitations   hypoventilation    Wasp Venom Anaphylaxis   Sumatriptan Other (See Comments)   sinus pain, sinus on fire and face hurts   Doxycycline Nausea And Vomiting   Ibuprofen Nausea And Vomiting   GI upset   Methocarbamol Rash      Medication List    TAKE these medications   albuterol 108 (90 Base) MCG/ACT inhaler Commonly known as: VENTOLIN HFA Inhale 1-2 puffs into the lungs every 6 (six) hours as needed for wheezing or shortness of breath.   carvedilol 6.25 MG tablet Commonly known as: COREG Take 6.25 mg by mouth every evening.   clonazePAM 1 MG tablet Commonly known as: KLONOPIN Take 1 mg by mouth at bedtime as needed for anxiety.   Coricidin HBP Congestion/Cough 10-200 MG Caps Generic drug: Dextromethorphan-guaiFENesin Take 1 capsule by mouth as needed (for cough).   cyclobenzaprine 10 MG tablet Commonly known  as: FLEXERIL Take 10 mg by mouth at bedtime.   diphenhydrAMINE 25 mg capsule Commonly known as: BENADRYL Take 25 mg by mouth at bedtime.   docusate sodium 100 MG capsule Commonly known as: COLACE Take 100 mg by mouth at bedtime.   EpiPen 2-Pak 0.3 mg/0.3 mL Soaj injection Generic drug: EPINEPHrine Inject 0.3 mLs (0.3 mg total) into the  muscle as needed (for allergic reaction).   escitalopram 10 MG tablet Commonly known as: LEXAPRO Take 10 mg by mouth at bedtime.   famotidine 20 MG tablet Commonly known as: PEPCID Take 1 tablet (20 mg total) by mouth daily for 6 days.   fexofenadine 60 MG tablet Commonly known as: ALLEGRA Take 1 tablet (60 mg total) by mouth 2 (two) times daily for 3 days.   fluticasone 50 MCG/ACT nasal spray Commonly known as: FLONASE Place 1 spray into both nostrils at bedtime.   gabapentin 300 MG capsule Commonly known as: NEURONTIN Take 900 mg by mouth at bedtime.   lidocaine 5 % Commonly known as: Lidoderm Place 1 patch onto the skin daily. Remove & Discard patch within 12 hours or as directed by Angie Brown   meloxicam 15 MG tablet Commonly known as: MOBIC Take 1 tablet by mouth at bedtime.   montelukast 10 MG tablet Commonly known as: SINGULAIR Take 10 mg by mouth at bedtime.   omeprazole 40 MG capsule Commonly known as: PRILOSEC Take 40 mg by mouth at bedtime.   predniSONE 10 MG tablet Commonly known as: DELTASONE Take 30mg  daily for 3days,Take 20mg  daily for 3days,Take 10mg  daily for 3days, then stop   rizatriptan 5 MG disintegrating tablet Commonly known as: MAXALT-MLT Take 5 mg by mouth as needed for migraine.      Allergies  Allergen Reactions  . Loratadine Shortness Of Breath, Swelling and Palpitations    hypoventilation   . Wasp Venom Anaphylaxis  . Sumatriptan Other (See Comments)    sinus pain, sinus on fire and face hurts  . Doxycycline Nausea And Vomiting  . Ibuprofen Nausea And Vomiting    GI upset  . Methocarbamol Rash   Discharge Instructions    Diet - low sodium heart healthy   Complete by: As directed    Discharge instructions   Complete by: As directed    It is important that you read the given instructions as well as go over your medication list with RN to help you understand your care after this hospitalization.  Discharge Instructions: Please  follow-up with PCP in 1-2 weeks  Please request your primary care physician to go over all Hospital Tests and Procedure/Radiological results at the follow up. Please get all Hospital records sent to your PCP by signing hospital release before you go home.   Do not take more than prescribed Pain, Sleep and Anxiety Medications. You were cared for by a hospitalist during your hospital stay. If you have any questions about your discharge medications or the care you received while you were in the hospital after you are discharged, you can call the unit @UNIT @ you were admitted to and ask to speak with the hospitalist on call if the hospitalist that took care of you is not available.  Once you are discharged, your primary care physician will handle any further medical issues. Please note that NO REFILLS for any discharge medications will be authorized once you are discharged, as it is imperative that you return to your primary care physician (or establish a relationship with a primary care physician  if you do not have one) for your aftercare needs so that they can reassess your need for medications and monitor your lab values. You Must read complete instructions/literature along with all the possible adverse reactions/side effects for all the Medicines you take and that have been prescribed to you. Take any new Medicines after you have completely understood and accept all the possible adverse reactions/side effects. Wear Seat belts while driving. If you have smoked or chewed Tobacco in the last 2 yrs please stop smoking and/or stop any Recreational drug use.  If you drink alcohol, please moderate the use and do not drive, operating heavy machinery, perform activities at heights, swimming or participation in water activities or provide baby sitting services under influence.   Increase activity slowly   Complete by: As directed      Discharge Exam: Filed Weights   10/12/18 2033 10/12/18 2350  Weight:  113.4 kg 114.6 kg   Vitals:   10/12/18 2350 10/13/18 0402  BP: 107/82 103/71  Pulse: 84 (!) 103  Resp:  (!) 25  Temp: 98.2 F (36.8 C) 97.6 F (36.4 C)  SpO2: 95% 100%   General: Appear in no distress, no Rash; Oral Mucosa Clear, moist. no Abnormal Mass Or lumps Cardiovascular: S1 and S2 Present, no Murmur, Respiratory: normal respiratory effort, Bilateral Air entry present and Clear to Auscultation, no Crackles, no wheezes Abdomen: Bowel Sound present, Soft and no tenderness, no hernia Extremities: no Pedal edema, no calf tenderness Neurology: alert and oriented to time, place, and person affect anxious. normal without focal findings, mental status, speech normal, alert and oriented x3, PERLA, Motor strength 5/5 and symmetric and sensation grossly normal to light touch   The results of significant diagnostics from this hospitalization (including imaging, microbiology, ancillary and laboratory) are listed below for reference.    Significant Diagnostic Studies: No results found.  Microbiology: Recent Results (from the past 240 hour(s))  SARS Coronavirus 2 (CEPHEID - Performed in [facility] hospital lab), Hosp Order     Status: None   Collection Time: 10/12/18  9:02 PM   Specimen: Nasopharyngeal Swab  Result Value Ref Range Status   SARS Coronavirus 2 NEGATIVE NEGATIVE Final    Comment: (NOTE) If result is NEGATIVE SARS-CoV-2 target nucleic acids are NOT DETECTED. The SARS-CoV-2 RNA is generally detectable in upper and lower  respiratory specimens during the acute phase of infection. The lowest  concentration of SARS-CoV-2 viral copies this assay can detect is 250  copies / mL. A negative result does not preclude SARS-CoV-2 infection  and should not be used as the sole basis for treatment or other  patient management decisions.  A negative result may occur with  improper specimen collection / handling, submission of specimen other  than nasopharyngeal swab, presence of viral  mutation(s) within the  areas targeted by this assay, and inadequate number of viral copies  (<250 copies / mL). A negative result must be combined with clinical  observations, patient history, and epidemiological information. If result is POSITIVE SARS-CoV-2 target nucleic acids are DETECTED. The SARS-CoV-2 RNA is generally detectable in upper and lower  respiratory specimens dur ing the acute phase of infection.  Positive  results are indicative of active infection with SARS-CoV-2.  Clinical  correlation with patient history and other diagnostic information is  necessary to determine patient infection status.  Positive results do  not rule out bacterial infection or co-infection with other viruses. If result is PRESUMPTIVE POSTIVE SARS-CoV-2 nucleic acids MAY BE  PRESENT.   A presumptive positive result was obtained on the submitted specimen  and confirmed on repeat testing.  While 2019 novel coronavirus  (SARS-CoV-2) nucleic acids may be present in the submitted sample  additional confirmatory testing may be necessary for epidemiological  and / or clinical management purposes  to differentiate between  SARS-CoV-2 and other Sarbecovirus currently known to infect humans.  If clinically indicated additional testing with an alternate test  methodology [phone]([id number]) is advised. The SARS-CoV-2 RNA is generally  detectable in upper and lower respiratory sp ecimens during the acute  phase of infection. The expected result is Negative. Fact Sheet for Patients:  BoilerBrush.com.cyhttps://www.fda.gov/media/136312/download Fact Sheet for Healthcare Providers: https://pope.com/https://www.fda.gov/media/136313/download This test is not yet approved or cleared by the [place] FDA and has been authorized for detection and/or diagnosis of SARS-CoV-2 by FDA under an Emergency Use Authorization (EUA).  This EUA will remain in effect (meaning this test can be used) for the duration of the COVID-19 declaration under Section 564(b)(1)  of the Act, 21 U.S.C. section 360bbb-3(b)(1), unless the authorization is terminated or revoked sooner. Performed at [facility], [address] [place], [place] [address]      Labs: CBC: Recent Labs  Lab 10/12/18 2115 10/13/18 0539  WBC 9.0 8.4  NEUTROABS 7.5  --   HGB 13.6 13.4  HCT 39.4 39.3  MCV 95.4 95.6  PLT 239 273   Basic Metabolic Panel: Recent Labs  Lab 10/12/18 2115 10/13/18 0539  NA 138 135  K 4.2 4.0  CL 107 104  CO2 24 22  GLUCOSE 131* 231*  BUN 6 6  CREATININE 0.82 0.94  CALCIUM 8.6* 8.6*   Time spent: 35 minutes  Signed:  Lynden OxfordPranav Manar Smalling  [facility]  10/13/2018

## 2018-10-13 NOTE — Progress Notes (Signed)
Spoke to patient's father, Mr. Sherrine Maples and gave him updates on patient. Explained to him that patient would be discharged today. Patient's father said that it would be ok for the aunt to pick her up at discharge.

## 2018-10-13 NOTE — Progress Notes (Addendum)
Patient continuing to remove telemetry monitoring after being educated on the importance of leaving it on.  Text paged provider and notified of patient noncompliance.

## 2018-10-13 NOTE — Progress Notes (Signed)
Patient is ready for discharge. She has all of her belongings. Reviewed discharge papers with patient and answered all questions regarding discharge. Patient does not have any IV's  And she has been taken off telemetry. Spoke to patient's aunt who will pick her up at the front entrance of the hospital and transport her home.

## 2018-10-13 NOTE — Discharge Instructions (Signed)
Anaphylactic Reaction, Adult An anaphylactic reaction (anaphylaxis) is a sudden, severe allergic reaction by the body's disease-fighting system (immune system). Anaphylaxis can be life-threatening. This condition must be treated right away. Sometimes a person may need to be treated in the hospital. What are the causes? This condition is caused by exposure to a substance that you are allergic to (allergen). In response to this exposure, the body releases proteins (antibodies) and other compounds, such as histamine, into the bloodstream. This causes swelling in certain tissues and loss of blood pressure to important areas, such as the heart and lungs. Common allergens that can cause anaphylaxis include:  Foods, especially peanuts, wheat, shellfish, milk, and eggs.  Medicines.  Insect bites or stings.  Blood or parts of blood received for treatment (transfusions).  Chemicals, such as dyes, latex, and contrast material that is used for medical tests. What increases the risk? This condition is more likely to occur in people who:  Have allergies.  Have had anaphylaxis before.  Have a family history of anaphylaxis.  Have certain medical conditions, including asthma and eczema. What are the signs or symptoms? Symptoms of anaphylaxis may include:  Feeling warm in the face (flushed). This may include redness.  Itchy, red, swollen areas of skin (hives).  Swelling of the eyes, lips, face, mouth, tongue, or throat.  Difficulty breathing, speaking, or swallowing.  Noisy breathing (wheezing).  Dizziness or light-headedness.  Fainting.  Pain or cramping in the abdomen.  Vomiting.  Diarrhea. How is this diagnosed? This condition is diagnosed based on:  Your symptoms.  A physical exam.  Blood tests.  Recent history of exposure to allergens. How is this treated? If you think you are having an anaphylactic reaction, you should do the following right away:  Give yourself an  epinephrine injection using what is commonly called an auto-injector "pen" (pre-filled automatic epinephrine injection device). Your health care provider will teach you how to use an auto-injector pen.  Call for emergency help. If you use a pen, you must still get emergency medical treatment in the hospital. Treatment in the hospital may include: ? Medicines to help:  Tighten your blood vessels (epinephrine).  Relieve itching and hives (antihistamines).  Reduce swelling (corticosteroids). ? Oxygen therapy to help you breathe. ? IV fluids to keep you hydrated. Follow these instructions at home: Safety  Always keep an auto-injector pen near you. This can be lifesaving if you have a severe anaphylactic reaction. Use your auto-injector pen as told by your health care provider.  Do not drive after an anaphylactic reaction until your health care provider approves.  Make sure that you, the members of your household, and your employer know: ? What you are allergic to, so it can be avoided. ? How to use an auto-injector pen to give you an epinephrine injection.  Replace your epinephrine immediately after you use your auto-injector pen. This is important if you have another reaction.  If told by your health care provider, wear a medical alert bracelet or necklace that states your allergy.  Learn the signs of anaphylaxis so that you can recognize and treat it right away.  Work with your health care providers to make an anaphylaxis plan. Preparation is important. General instructions  If you have hives or rash: ? Use an over-the-counter antihistamine as told by your health care provider. ? Apply cold, wet cloths (cold compresses) to your skin or take baths or showers in cool water. Avoid hot water.  Take over-the-counter and prescription medicines only  as told by your health care provider.  Tell all your health care providers that you have an allergy.  Keep all follow-up visits as told by  your health care provider. This is important. How is this prevented?  Avoid allergens that have caused an anaphylactic reaction in the past.  When you are at a restaurant, tell your server that you have an allergy. If you are not sure whether a menu item contains an ingredient that you are allergic to, ask your server. Where to find more information  American Academy of Allergy, Asthma and Immunology: aaaai.org  American Academy of Pediatrics: healthychildren.org Get help right away if:  You develop symptoms of an allergic reaction. You may notice them soon after you are exposed to a substance. Symptoms may include: ? Flushed skin. ? Hives. ? Swelling of the eyes, lips, face, mouth, tongue, or throat. ? Difficulty breathing, speaking, or swallowing. ? Wheezing. ? Dizziness or light-headedness. ? Fainting. ? Pain or cramping in the abdomen. ? Vomiting. ? Diarrhea.  You used epinephrine. You need more medical care even if the medicine seems to be working. This is important because anaphylaxis may happen again within 72 hours (rebound anaphylaxis). You may need more doses of epinephrine. These symptoms may represent a serious problem that is an emergency. Do not wait to see if the symptoms will go away. Do the following right away:  Use the auto-injector pen as you have been instructed.  Get medical help. Call your local emergency services (911 in the [place] Do not drive yourself to the hospital. Summary  An anaphylactic reaction (anaphylaxis) is a sudden, severe allergic reaction by the body's disease-fighting system (immune system).  This condition can be life-threatening. If you have an anaphylactic reaction, get medical help right away.  Your health care provider may teach you how to use an auto-injector "pen" (pre-filled automatic epinephrine injection device) to give yourself a shot.  Always keep an auto-injector pen with you. This could save your life. Use it as told by  your health care provider.  If you use epinephrine, you must still get emergency medical treatment, even if the medicine seems to be working. This information is not intended to replace advice given to you by your health care provider. Make sure you discuss any questions you have with your health care provider. Document Released: 03/06/2005 Document Revised: 06/28/2017 Document Reviewed: 06/28/2017 Elsevier Patient Education  [place], or [employer], Adult Bees, wasps, and hornets are part of a family of insects that can sting people. These stings can cause pain and inflammation, but they are usually not serious. However, some people may have an allergic reaction to a sting. This can cause the symptoms to be more severe. What increases the risk? You may be at a greater risk of getting stung if you:  Provoke a stinging insect by swatting or disturbing it.  Wear strong-smelling soaps, deodorants, or body sprays.  Spend time outdoors near gardens with flowers or fruit trees or in clothes that expose skin.  Eat or drink outside. What are the signs or symptoms? Common symptoms of this condition include:  A red lump in the skin that sometimes has a tiny hole in the center. In some cases, a stinger may be in the center of the wound.  Pain and itching at the sting site.  Redness and swelling around the sting site. If you have an allergic reaction (localized allergic reaction), the swelling and redness may spread  out from the sting site. In some cases, this reaction can continue to develop over the next 24-48 hours. In rare cases, a person may have a severe allergic reaction (anaphylactic reaction) to a sting. Symptoms of an anaphylactic reaction may include:  Wheezing or difficulty breathing.  Raised, itchy, red patches on the skin (hives).  Nausea or vomiting.  Abdominal cramping.  Diarrhea.  Tightness in the chest or chest pain.  Dizziness or  fainting.  Redness of the face (flushing).  Hoarse voice.  Swollen tongue, lips, or face. How is this diagnosed? This condition is usually diagnosed based on your symptoms and medical history as well as a physical exam. You may have an allergy test to determine if you are allergic to the substance that the insect injected during the sting (venom). How is this treated? If you were stung by a bee, the stinger and a small sac of venom may be in the wound. It is important to remove the stinger as soon as possible. You can do this by brushing across the wound with gauze, a fingernail, or a flat card such as a credit card. Removing the stinger can help reduce the severity of your bodys reaction to the sting. Most stings can be treated with:  Icing to reduce swelling in the area.  Medicines (antihistamines) to treat itching or an allergic reaction.  Medicines to help reduce pain. These may be medicines that you take by mouth, or medicated creams or lotions that you apply to your skin. Pay close attention to your symptoms after you have been stung. If possible, have someone stay with you to make sure you do not have an allergic reaction. If you have any signs of an allergic reaction, call your health care provider. If you have ever had a severe allergic reaction, your health care provider may give you an inhaler or injectable medicine (epinephrine auto-injector) to use if necessary. Follow these instructions at home:   Wash the sting site 2-3 times each day with soap and water as told by your health care provider.  Apply or take over-the-counter and prescription medicines only as told by your health care provider.  If directed, apply ice to the sting area. ? Put ice in a plastic bag. ? Place a towel between your skin and the bag. ? Leave the ice on for 20 minutes, 2-3 times a day.  Do not scratch the sting area.  If you had a severe allergic reaction to a sting, you may need: ? To wear a  medical bracelet or necklace that lists the allergy. ? To learn when and how to use an anaphylaxis kit or epinephrine injection. Your family members and coworkers may also need to learn this. ? To carry an anaphylaxis kit or epinephrine injection with you at all times. How is this prevented?  Avoid swatting at stinging insects and disturbing insect nests.  Do not use fragrant soaps or lotions.  Wear shoes, pants, and long sleeves when spending time outdoors, especially in grassy areas where stinging insects are common.  Keep outdoor areas free from nests or hives.  Keep food and drink containers covered when eating outdoors.  Avoid working or sitting near [employer], if possible.  Wear gloves if you are gardening or working outdoors.  If an attack by a stinging insect or a swarm seems likely in the moment, move away from the area or find a barrier between you and the insect(s), such as a door. Contact a health  care provider if:  Your symptoms do not get better in 2-3 days.  You have redness, swelling, or pain that spreads beyond the area of the sting.  You have a fever. Get help right away if: You have symptoms of a severe allergic reaction. These include:  Wheezing or difficulty breathing.  Tightness in the chest or chest pain.  Light-headedness or fainting.  Itchy, raised, red patches on the skin.  Nausea or vomiting.  Abdominal cramping.  Diarrhea.  A swollen tongue or lips, or trouble swallowing.  Dizziness or fainting. Summary  Stings from bees, wasps, and hornets can cause pain and inflammation, but they are usually not serious. However, some people may have an allergic reaction to a sting. This can cause the symptoms to be more severe.  Pay close attention to your symptoms after you have been stung. If possible, have someone stay with you to make sure you do not have an allergic reaction.  Call your health care provider if you have any signs of an  allergic reaction. This information is not intended to replace advice given to you by your health care provider. Make sure you discuss any questions you have with your health care provider. Document Released: 03/06/2005 Document Revised: 03/01/2017 Document Reviewed: 05/11/2016 Elsevier Patient Education  2020 [employer].

## 2018-12-02 ENCOUNTER — Inpatient Hospital Stay (HOSPITAL_COMMUNITY)
Admission: EM | Admit: 2018-12-02 | Discharge: 2018-12-08 | DRG: 442 | Disposition: A | Discharge status: Home / Self Care | Payer: Medicaid Other | Attending: Family Medicine | Admitting: Family Medicine

## 2018-12-02 ENCOUNTER — Other Ambulatory Visit: Payer: Self-pay

## 2018-12-02 DIAGNOSIS — F17210 Nicotine dependence, cigarettes, uncomplicated: Secondary | ICD-10-CM | POA: Diagnosis present

## 2018-12-02 DIAGNOSIS — Z6841 Body Mass Index (BMI) 40.0 and over, adult: Secondary | ICD-10-CM

## 2018-12-02 DIAGNOSIS — E282 Polycystic ovarian syndrome: Secondary | ICD-10-CM | POA: Diagnosis present

## 2018-12-02 DIAGNOSIS — K759 Inflammatory liver disease, unspecified: Secondary | ICD-10-CM | POA: Diagnosis present

## 2018-12-02 DIAGNOSIS — Z20828 Contact with and (suspected) exposure to other viral communicable diseases: Secondary | ICD-10-CM | POA: Diagnosis present

## 2018-12-02 DIAGNOSIS — G8929 Other chronic pain: Secondary | ICD-10-CM | POA: Diagnosis present

## 2018-12-02 DIAGNOSIS — Z981 Arthrodesis status: Secondary | ICD-10-CM

## 2018-12-02 DIAGNOSIS — Z818 Family history of other mental and behavioral disorders: Secondary | ICD-10-CM

## 2018-12-02 DIAGNOSIS — K7581 Nonalcoholic steatohepatitis (NASH): Secondary | ICD-10-CM

## 2018-12-02 DIAGNOSIS — E785 Hyperlipidemia, unspecified: Secondary | ICD-10-CM | POA: Diagnosis present

## 2018-12-02 DIAGNOSIS — J45909 Unspecified asthma, uncomplicated: Secondary | ICD-10-CM | POA: Diagnosis present

## 2018-12-02 DIAGNOSIS — H5789 Other specified disorders of eye and adnexa: Secondary | ICD-10-CM | POA: Diagnosis present

## 2018-12-02 DIAGNOSIS — R1114 Bilious vomiting: Secondary | ICD-10-CM | POA: Diagnosis present

## 2018-12-02 DIAGNOSIS — K589 Irritable bowel syndrome without diarrhea: Secondary | ICD-10-CM | POA: Diagnosis present

## 2018-12-02 DIAGNOSIS — R1110 Vomiting, unspecified: Secondary | ICD-10-CM

## 2018-12-02 DIAGNOSIS — G4700 Insomnia, unspecified: Secondary | ICD-10-CM | POA: Diagnosis present

## 2018-12-02 DIAGNOSIS — Z90710 Acquired absence of both cervix and uterus: Secondary | ICD-10-CM

## 2018-12-02 DIAGNOSIS — F329 Major depressive disorder, single episode, unspecified: Secondary | ICD-10-CM | POA: Diagnosis present

## 2018-12-02 DIAGNOSIS — Z79899 Other long term (current) drug therapy: Secondary | ICD-10-CM

## 2018-12-02 DIAGNOSIS — R7401 Elevation of levels of liver transaminase levels: Secondary | ICD-10-CM | POA: Diagnosis present

## 2018-12-02 DIAGNOSIS — F419 Anxiety disorder, unspecified: Secondary | ICD-10-CM | POA: Diagnosis present

## 2018-12-02 DIAGNOSIS — B179 Acute viral hepatitis, unspecified: Principal | ICD-10-CM | POA: Diagnosis present

## 2018-12-02 DIAGNOSIS — Z881 Allergy status to other antibiotic agents status: Secondary | ICD-10-CM

## 2018-12-02 DIAGNOSIS — Z7951 Long term (current) use of inhaled steroids: Secondary | ICD-10-CM

## 2018-12-02 DIAGNOSIS — Z886 Allergy status to analgesic agent status: Secondary | ICD-10-CM

## 2018-12-02 DIAGNOSIS — Z791 Long term (current) use of non-steroidal anti-inflammatories (NSAID): Secondary | ICD-10-CM

## 2018-12-02 DIAGNOSIS — Z9049 Acquired absence of other specified parts of digestive tract: Secondary | ICD-10-CM

## 2018-12-02 DIAGNOSIS — R109 Unspecified abdominal pain: Secondary | ICD-10-CM

## 2018-12-02 DIAGNOSIS — K219 Gastro-esophageal reflux disease without esophagitis: Secondary | ICD-10-CM | POA: Diagnosis present

## 2018-12-02 DIAGNOSIS — Z888 Allergy status to other drugs, medicaments and biological substances status: Secondary | ICD-10-CM

## 2018-12-02 DIAGNOSIS — Z91048 Other nonmedicinal substance allergy status: Secondary | ICD-10-CM

## 2018-12-02 DIAGNOSIS — R112 Nausea with vomiting, unspecified: Secondary | ICD-10-CM

## 2018-12-02 DIAGNOSIS — E7439 Other disorders of intestinal carbohydrate absorption: Secondary | ICD-10-CM | POA: Diagnosis present

## 2018-12-02 DIAGNOSIS — R52 Pain, unspecified: Secondary | ICD-10-CM

## 2018-12-02 LAB — CBC WITH DIFFERENTIAL/PLATELET
Abs Immature Granulocytes: 0.04 10*3/uL (ref 0.00–0.07)
Basophils Absolute: 0.1 10*3/uL (ref 0.0–0.1)
Basophils Relative: 1 %
Eosinophils Absolute: 0.3 10*3/uL (ref 0.0–0.5)
Eosinophils Relative: 3 %
HCT: 44.3 % (ref 36.0–46.0)
Hemoglobin: 15.2 g/dL — ABNORMAL HIGH (ref 12.0–15.0)
Immature Granulocytes: 0 %
Lymphocytes Relative: 12 %
Lymphs Abs: 1.2 10*3/uL (ref 0.7–4.0)
MCH: 32.5 pg (ref 26.0–34.0)
MCHC: 34.3 g/dL (ref 30.0–36.0)
MCV: 94.9 fL (ref 80.0–100.0)
Monocytes Absolute: 0.4 10*3/uL (ref 0.1–1.0)
Monocytes Relative: 4 %
Neutro Abs: 8 10*3/uL — ABNORMAL HIGH (ref 1.7–7.7)
Neutrophils Relative %: 80 %
Platelets: 279 10*3/uL (ref 150–400)
RBC: 4.67 MIL/uL (ref 3.87–5.11)
RDW: 12.7 % (ref 11.5–15.5)
WBC: 10 10*3/uL (ref 4.0–10.5)
nRBC: 0 % (ref 0.0–0.2)

## 2018-12-02 MED ORDER — MORPHINE SULFATE (PF) 4 MG/ML IV SOLN
4.0000 mg | Freq: Once | INTRAVENOUS | Status: AC
  Administered 2018-12-02: 4 mg via INTRAVENOUS
  Filled 2018-12-02: qty 1

## 2018-12-02 MED ORDER — SODIUM CHLORIDE 0.9 % IV BOLUS
1000.0000 mL | Freq: Once | INTRAVENOUS | Status: AC
  Administered 2018-12-02: 1000 mL via INTRAVENOUS

## 2018-12-02 MED ORDER — ONDANSETRON HCL 4 MG/2ML IJ SOLN
4.0000 mg | Freq: Once | INTRAMUSCULAR | Status: AC
  Administered 2018-12-02: 4 mg via INTRAVENOUS
  Filled 2018-12-02: qty 2

## 2018-12-02 NOTE — ED Provider Notes (Signed)
[facility]-EMERGENCY DEPT Provider Note   CSN: [id number] Arrival date & time: 12/02/18  2159     History   Chief Complaint Chief Complaint  Patient presents with  . Abdominal Pain  . Nausea  . Emesis    HPI Angie Brown is a 38 y.o. adult.     The history is provided by the patient and medical records.  Abdominal Pain Associated symptoms: nausea and vomiting   Emesis Associated symptoms: abdominal pain      37 year old female with history of asthma, bronchitis, PVCs, chronic pain, history of multiple allergies with anaphylactic reaction, presenting to the ED with abdominal pain, nausea, and vomiting for the past 3 days.  Initially this began as a mild discomfort with nausea and vomiting but pain has worsened.  States she has a "raw" sensation in her stomach on the left side, but has a great deal of pain on her right side.  States she has been unable to lay on her right side which she usually sleeps on.  She denies any diarrhea.  She did feel like she had a fever last evening.  States she is been trying to sip on Sprite throughout the day today but is unable to hold it down.  She denies any recent travel, sick contacts, or abnormal food intake.  She has been taking Zofran ODT today without relief.  She is status post cholecystectomy, hysterectomy, bladder tack.  Past Medical History:  Diagnosis Date  . Asthma   . Bronchitis   . Dysrhythmia    PVCs- stress related  . Hypoglycemia   . PONV (postoperative nausea and vomiting)    pt prefers scop patch    Patient Active Problem List   Diagnosis Date Noted  . 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 Anal pain 06/08/2017  . Chronic constipation 02/14/2017  . Asthma 01/26/2017  . Anaphylaxis 09/29/2016  . Elevated glucose 09/29/2016  . Chronic pain 06/29/2016  . 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 Voiding dysfunction 03/08/2016  . Other insomnia 03/01/2016  . Elevated blood  pressure reading 01/05/2016  . GAD (generalized anxiety disorder) 01/05/2016  . Gastroesophageal reflux disease without esophagitis 01/05/2016  . Recurrent major depressive disorder (HCC) 01/05/2016  . Stress incontinence in female 03/11/2014  . HYPOTENSION 03/18/2010  . ARM PAIN 04/28/2009  . ABNORMAL INVOLUNTARY MOVEMENTS 04/28/2009  . FACIAL PARESTHESIA 04/28/2009  . HEADACHE 04/26/2009  . WHEEZING 03/23/2009  . DIARRHEA, ACUTE, CHRONIC 02/19/2009  . HYDRONEPHROSIS, RIGHT 02/18/2009  . PYELONEPHRITIS, HX OF 02/18/2009  . MIGRAINES, HX OF 02/18/2009  . LUMBAR RADICULOPATHY, RIGHT 12/23/2008  . ASTHMATIC BRONCHITIS, ACUTE 01/22/2008  . BACK PAIN 01/22/2008  . ELEVATED BLOOD PRESSURE WITHOUT DIAGNOSIS OF HYPERTENSION 01/22/2008  . OBESITY, MORBID 05/29/2007  . INSOMNIA-SLEEP DISORDER-UNSPEC 05/29/2007  . HYPERSOMNIA 05/29/2007  . ANEMIA-NOS 01/01/2007  . ANXIETY 01/01/2007  . DEPRESSION 01/01/2007  . COMMON MIGRAINE 01/01/2007  . ASTHMA 01/01/2007  . SYNCOPE 01/01/2007    Past Surgical History:  Procedure Laterality Date  . ABDOMINAL HYSTERECTOMY    . BACK SURGERY  07/2009   spinal fusion L 5-S1  . BLADDER SUSPENSION N/A 03/11/2014   Procedure: TRANSVAGINAL TAPE (TVT) PROCEDURE;  Surgeon: Lavina Hamman, MD;  Location: [facility];  Service: Gynecology;  Laterality: N/A;  . CHOLECYSTECTOMY    . TONSILLECTOMY       OB History   No obstetric history on file.      Home Medications    Prior to  Admission medications   Medication Sig Start Date End Date Taking? Authorizing Provider  albuterol (VENTOLIN HFA) 108 (90 Base) MCG/ACT inhaler Inhale 1-2 puffs into the lungs every 6 (six) hours as needed for wheezing or shortness of breath.    [provider]  carvedilol (COREG) 6.25 MG tablet Take 6.25 mg by mouth every evening.    [provider]  clonazePAM (KLONOPIN) 1 MG tablet Take 1 mg by mouth at bedtime as needed for anxiety.  06/18/18   [provider]  cyclobenzaprine (FLEXERIL) 10 MG tablet Take 10 mg by mouth at bedtime.     [provider]  Dextromethorphan-guaiFENesin (CORICIDIN HBP CONGESTION/COUGH) 10-200 MG CAPS Take 1 capsule by mouth as needed (for cough).    [provider]  diphenhydrAMINE (BENADRYL) 25 mg capsule Take 25 mg by mouth at bedtime.    [provider]  docusate sodium (COLACE) 100 MG capsule Take 100 mg by mouth at bedtime.    [provider]  EPIPEN 2-PAK 0.3 MG/0.3ML SOAJ injection Inject 0.3 mLs (0.3 mg total) into the muscle as needed (for allergic reaction). 10/13/18   Rolly Salter, MD  escitalopram (LEXAPRO) 10 MG tablet Take 10 mg by mouth at bedtime.    [provider]  famotidine (PEPCID) 20 MG tablet Take 1 tablet (20 mg total) by mouth daily for 6 days. 10/13/18 10/19/18  Rolly Salter, MD  fexofenadine (ALLEGRA) 60 MG tablet Take 1 tablet (60 mg total) by mouth 2 (two) times daily for 3 days. 10/13/18 10/16/18  Rolly Salter, MD  fluticasone (FLONASE) 50 MCG/ACT nasal spray Place 1 spray into both nostrils at bedtime.    [provider]  gabapentin (NEURONTIN) 300 MG capsule Take 900 mg by mouth at bedtime.    [provider]  lidocaine (LIDODERM) 5 % Place 1 patch onto the skin daily. Remove & Discard patch within 12 hours or as directed by MD Patient not taking: Reported on 10/12/2018 08/20/18   Caccavale, [name], PA-C  meloxicam (MOBIC) 15 MG tablet Take 1 tablet by mouth at bedtime. 04/25/17   [provider]  montelukast (SINGULAIR) 10 MG tablet Take 10 mg by mouth at bedtime.    [provider]  omeprazole (PRILOSEC) 40 MG capsule Take 40 mg by mouth at bedtime.    [provider]  predniSONE (DELTASONE) 10 MG tablet Take 30mg  daily for 3days,Take 20mg  daily for 3days,Take 10mg  daily for 3days, then stop 10/13/18   Rolly Salter, MD  rizatriptan (MAXALT-MLT) 5 MG disintegrating tablet Take 5 mg by mouth  as needed for migraine.  09/25/18   [provider]    Family History No family history on file.  Social History Social History   Tobacco Use  . Smoking status: Current Every Day Smoker    Packs/day: 0.50    Types: Cigarettes  . Smokeless tobacco: Never Used  Substance Use Topics  . Alcohol use: Yes    Comment: social  . Drug use: No     Allergies   Loratadine, Wasp venom, Sumatriptan, Doxycycline, Ibuprofen, and Methocarbamol   Review of Systems Review of Systems  Gastrointestinal: Positive for abdominal pain, nausea and vomiting.  All other systems reviewed and are negative.    Physical Exam Updated Vital Signs BP (!) 145/108 (BP Location: Right Arm)   Pulse (!) 121   Temp 98.8 F (37.1 C) (Oral)   Resp 16   Ht 5\' 7"  (1.702 m)  Wt 117.9 kg   SpO2 97%   BMI 40.72 kg/m   Physical Exam Vitals signs and nursing note reviewed.  Constitutional:      Appearance: He is well-developed.  HENT:     Head: Normocephalic and atraumatic.  Eyes:     Conjunctiva/sclera: Conjunctivae normal.     Pupils: Pupils are equal, round, and reactive to light.  Neck:     Musculoskeletal: Normal range of motion.  Cardiovascular:     Rate and Rhythm: Normal rate and regular rhythm.     Heart sounds: Normal heart sounds.  Pulmonary:     Effort: Pulmonary effort is normal.     Breath sounds: Normal breath sounds.  Abdominal:     General: Bowel sounds are normal.     Palpations: Abdomen is soft.     Tenderness: There is abdominal tenderness in the right lower quadrant.    Musculoskeletal: Normal range of motion.  Skin:    General: Skin is warm and dry.  Neurological:     Mental Status: He is alert and oriented to person, place, and time.      ED Treatments / Results  Labs (all labs ordered are listed, but only abnormal results are displayed) Labs Reviewed  CBC WITH DIFFERENTIAL/PLATELET - Abnormal; Notable for the following components:      Result Value    Hemoglobin 15.2 (*)    Neutro Abs 8.0 (*)    All other components within normal limits  URINALYSIS, ROUTINE W REFLEX MICROSCOPIC - Abnormal; Notable for the following components:   APPearance HAZY (*)    Specific Gravity, Urine >1.046 (*)    Hgb urine dipstick SMALL (*)    Ketones, ur 5 (*)    Nitrite POSITIVE (*)    Leukocytes,Ua MODERATE (*)    All other components within normal limits  COMPREHENSIVE METABOLIC PANEL - Abnormal; Notable for the following components:   Glucose, Bld 130 (*)    Calcium 8.1 (*)    AST 797 (*)    ALT 758 (*)    All other components within normal limits  SARS CORONAVIRUS 2 (HOSPITAL ORDER, PERFORMED IN [facility])  LIPASE, BLOOD  PROTIME-INR  APTT  HEPATITIS PANEL, ACUTE  EPSTEIN-BARR VIRUS VCA, IGG  EPSTEIN-BARR VIRUS VCA, IGM  CMV IGM  ANTINUCLEAR ANTIBODIES, IFA  ANTI-SMOOTH MUSCLE ANTIBODY, IGG  MITOCHONDRIAL ANTIBODIES  ANTI-MICROSOMAL ANTIBODY LIVER / KIDNEY  IGG  ACETAMINOPHEN LEVEL  ETHANOL    EKG None  Radiology Ct Abdomen Pelvis W Contrast  Result Date: 12/03/2018 CLINICAL DATA:  Abdominal pain, appendicitis suspected, right lower quadrant pain and vomiting for 3 days EXAM: CT ABDOMEN AND PELVIS WITH CONTRAST TECHNIQUE: Multidetector CT imaging of the abdomen and pelvis was performed using the standard protocol following bolus administration of intravenous contrast. CONTRAST:  100mL OMNIPAQUE IOHEXOL 300 MG/ML  SOLN COMPARISON:  CT abdomen pelvis 02/22/2017 FINDINGS: Lower chest: Dependent atelectasis. Lung bases are otherwise clear. Normal heart size. No pericardial effusion. Hepatobiliary: Diffuse hepatic hypoattenuation compatible with hepatic steatosis. No focal liver abnormality is seen. Patient is post cholecystectomy. Slight prominence of the biliary tree likely related to reservoir effect. No calcified intraductal gallstones. Pancreas: Fatty replacement of the pancreas. No pancreatic ductal dilatation or surrounding  inflammatory changes. Spleen: Normal in size without focal abnormality. Small accessory splenule Adrenals/Urinary Tract: Normal adrenal glands. Mild bilateral symmetric perinephric stranding, a nonspecific finding though may correlate for decreased renal function. Kidneys are otherwise unremarkable, without renal calculi, suspicious lesion, or hydronephrosis.  Bladder is unremarkable. Stomach/Bowel: Distal esophagus, stomach and duodenal sweep are unremarkable. No bowel wall thickening or dilatation. No evidence of obstruction. A normal appendix is visualized. Vascular/Lymphatic: No significant vascular findings are present. No enlarged abdominal or pelvic lymph nodes. Reproductive: Post hysterectomy with retained ovaries. No concerning adnexal lesions. Other: No abdominopelvic free fluid or free gas. No bowel containing hernias. Musculoskeletal: Prior L5-S1 posterior spinal fusion with grade 1 anterolisthesis L5 on S1 stable from prior. IMPRESSION: 1. No evidence of acute intra-abdominal process. Normal appendix. 2. Hepatic steatosis. 3. Post cholecystectomy and hysterectomy. Electronically Signed   By: Kreg ShropshirePrice  DeHay M.D.   On: 12/03/2018 02:27    Procedures Procedures (including critical care time)  Medications Ordered in ED Medications  carvedilol (COREG) tablet 6.25 mg (has no administration in time range)  albuterol (VENTOLIN HFA) 108 (90 Base) MCG/ACT inhaler 1-2 puff (has no administration in time range)  clonazePAM (KLONOPIN) tablet 1 mg (has no administration in time range)  diphenhydrAMINE (BENADRYL) capsule 25 mg (has no administration in time range)  pantoprazole (PROTONIX) EC tablet 80 mg (has no administration in time range)  montelukast (SINGULAIR) tablet 10 mg (has no administration in time range)  meloxicam (MOBIC) tablet 15 mg (has no administration in time range)  gabapentin (NEURONTIN) capsule 900 mg (has no administration in time range)  escitalopram (LEXAPRO) tablet 10 mg (has no  administration in time range)  fluticasone (FLONASE) 50 MCG/ACT nasal spray 1 spray (has no administration in time range)  acetaminophen (TYLENOL) tablet 650 mg (has no administration in time range)    Or  acetaminophen (TYLENOL) suppository 650 mg (has no administration in time range)  ondansetron (ZOFRAN) tablet 4 mg (has no administration in time range)    Or  ondansetron (ZOFRAN) injection 4 mg (has no administration in time range)  enoxaparin (LOVENOX) injection 40 mg (has no administration in time range)  0.9 %  sodium chloride infusion (has no administration in time range)  ondansetron (ZOFRAN) injection 4 mg (4 mg Intravenous Given 12/02/18 2240)  morphine 4 MG/ML injection 4 mg (4 mg Intravenous Given 12/02/18 2240)  sodium chloride 0.9 % bolus 1,000 mL (0 mLs Intravenous Stopped 12/03/18 0159)  iohexol (OMNIPAQUE) 300 MG/ML solution 100 mL (100 mLs Intravenous Contrast Given 12/03/18 0204)  sodium chloride (PF) 0.9 % injection (  Given by Other 12/03/18 0217)  HYDROmorphone (DILAUDID) injection 1 mg (1 mg Intravenous Given 12/03/18 0235)  ondansetron (ZOFRAN) injection 4 mg (4 mg Intravenous Given 12/03/18 0235)     Initial Impression / Assessment and Plan / ED Course  I have reviewed the triage vital signs and the nursing notes.  Pertinent labs & imaging results that were available during my care of the patient were reviewed by me and considered in my medical decision making (see chart for details).  38 year old female here with abdominal pain, nausea, and vomiting for the past 3 days.  Pain is been worsening, unable to lay on her right side because of this.  She is afebrile and nontoxic but does appear uncomfortable.  She does have significant tenderness in her right lower quadrant during exam.  Plan for screening labs, symptomatic control, and CT scan.  Patient with normal CBC, however LFTs are significantly elevated at 797 and 758 AST/ALT respectively.  Last LFTs on record were from  December 2019 and were normal at that time.  CT scan today with findings of fatty liver, however no ductal stones.  Discussed findings with patient.  She denies  any alcohol or Tylenol use.  She has no history of IV drug use or hepatitis.  She has not recently been started on statin or any other hepatic processing medication.  I have added on PT/INR, APTT, hepatitis panel, ethanol, and Tylenol level.  UA pending.  Discussed with Dr. Alcario Drought, he will admit for ongoing care.  Likely will benefit from GI consultation in the morning.  Final Clinical Impressions(s) / ED Diagnoses   Final diagnoses:  Transaminitis  Abdominal pain, unspecified abdominal location  Non-intractable vomiting with nausea, unspecified vomiting type    ED Discharge Orders    None       Larene Pickett, PA-C 12/03/18 [id number]    Dorie Rank, MD 12/03/18 1240

## 2018-12-02 NOTE — ED Triage Notes (Signed)
Per EMS _ Pt coming from home with CC of N/v and abd pain x 3 days. Pt states she has "a raw feeling in her abd", EMS states that pt is tender in both RUQ + RLQ. Tried zofran at home with no improvement.   Zofran 4mg  NS 500cc   115 80 120 HR 99% RA CBG 139  97.0 16 R  22 Rt hand

## 2018-12-03 ENCOUNTER — Emergency Department (HOSPITAL_COMMUNITY): Payer: Medicaid Other

## 2018-12-03 ENCOUNTER — Encounter (HOSPITAL_COMMUNITY): Payer: Self-pay

## 2018-12-03 ENCOUNTER — Other Ambulatory Visit: Payer: Self-pay

## 2018-12-03 DIAGNOSIS — K759 Inflammatory liver disease, unspecified: Secondary | ICD-10-CM | POA: Diagnosis not present

## 2018-12-03 DIAGNOSIS — K219 Gastro-esophageal reflux disease without esophagitis: Secondary | ICD-10-CM | POA: Diagnosis present

## 2018-12-03 DIAGNOSIS — G4700 Insomnia, unspecified: Secondary | ICD-10-CM | POA: Diagnosis present

## 2018-12-03 DIAGNOSIS — H5789 Other specified disorders of eye and adnexa: Secondary | ICD-10-CM | POA: Diagnosis present

## 2018-12-03 DIAGNOSIS — R1114 Bilious vomiting: Secondary | ICD-10-CM | POA: Diagnosis present

## 2018-12-03 DIAGNOSIS — J45909 Unspecified asthma, uncomplicated: Secondary | ICD-10-CM | POA: Diagnosis present

## 2018-12-03 DIAGNOSIS — E282 Polycystic ovarian syndrome: Secondary | ICD-10-CM | POA: Diagnosis present

## 2018-12-03 DIAGNOSIS — F17210 Nicotine dependence, cigarettes, uncomplicated: Secondary | ICD-10-CM | POA: Diagnosis present

## 2018-12-03 DIAGNOSIS — G8929 Other chronic pain: Secondary | ICD-10-CM | POA: Diagnosis present

## 2018-12-03 DIAGNOSIS — K7581 Nonalcoholic steatohepatitis (NASH): Secondary | ICD-10-CM | POA: Diagnosis present

## 2018-12-03 DIAGNOSIS — F419 Anxiety disorder, unspecified: Secondary | ICD-10-CM | POA: Diagnosis present

## 2018-12-03 DIAGNOSIS — K589 Irritable bowel syndrome without diarrhea: Secondary | ICD-10-CM | POA: Diagnosis present

## 2018-12-03 DIAGNOSIS — Z881 Allergy status to other antibiotic agents status: Secondary | ICD-10-CM | POA: Diagnosis not present

## 2018-12-03 DIAGNOSIS — R112 Nausea with vomiting, unspecified: Secondary | ICD-10-CM | POA: Diagnosis not present

## 2018-12-03 DIAGNOSIS — R109 Unspecified abdominal pain: Secondary | ICD-10-CM

## 2018-12-03 DIAGNOSIS — B179 Acute viral hepatitis, unspecified: Secondary | ICD-10-CM | POA: Diagnosis present

## 2018-12-03 DIAGNOSIS — Z886 Allergy status to analgesic agent status: Secondary | ICD-10-CM | POA: Diagnosis not present

## 2018-12-03 DIAGNOSIS — R1011 Right upper quadrant pain: Secondary | ICD-10-CM | POA: Diagnosis not present

## 2018-12-03 DIAGNOSIS — E7439 Other disorders of intestinal carbohydrate absorption: Secondary | ICD-10-CM | POA: Diagnosis present

## 2018-12-03 DIAGNOSIS — Z981 Arthrodesis status: Secondary | ICD-10-CM | POA: Diagnosis not present

## 2018-12-03 DIAGNOSIS — F329 Major depressive disorder, single episode, unspecified: Secondary | ICD-10-CM | POA: Diagnosis present

## 2018-12-03 DIAGNOSIS — E785 Hyperlipidemia, unspecified: Secondary | ICD-10-CM | POA: Diagnosis present

## 2018-12-03 DIAGNOSIS — Z20828 Contact with and (suspected) exposure to other viral communicable diseases: Secondary | ICD-10-CM | POA: Diagnosis present

## 2018-12-03 DIAGNOSIS — Z9049 Acquired absence of other specified parts of digestive tract: Secondary | ICD-10-CM | POA: Diagnosis not present

## 2018-12-03 DIAGNOSIS — Z90710 Acquired absence of both cervix and uterus: Secondary | ICD-10-CM | POA: Diagnosis not present

## 2018-12-03 DIAGNOSIS — K228 Other specified diseases of esophagus: Secondary | ICD-10-CM | POA: Diagnosis not present

## 2018-12-03 DIAGNOSIS — Z6841 Body Mass Index (BMI) 40.0 and over, adult: Secondary | ICD-10-CM | POA: Diagnosis not present

## 2018-12-03 DIAGNOSIS — R7401 Elevation of levels of liver transaminase levels: Secondary | ICD-10-CM | POA: Diagnosis present

## 2018-12-03 DIAGNOSIS — R740 Nonspecific elevation of levels of transaminase and lactic acid dehydrogenase [LDH]: Secondary | ICD-10-CM | POA: Diagnosis not present

## 2018-12-03 DIAGNOSIS — Z888 Allergy status to other drugs, medicaments and biological substances status: Secondary | ICD-10-CM | POA: Diagnosis not present

## 2018-12-03 LAB — COMPREHENSIVE METABOLIC PANEL
ALT: 640 U/L — ABNORMAL HIGH (ref 0–44)
ALT: 758 U/L — ABNORMAL HIGH (ref 0–44)
AST: 584 U/L — ABNORMAL HIGH (ref 15–41)
AST: 797 U/L — ABNORMAL HIGH (ref 15–41)
Albumin: 3.6 g/dL (ref 3.5–5.0)
Albumin: 3.7 g/dL (ref 3.5–5.0)
Alkaline Phosphatase: 85 U/L (ref 38–126)
Alkaline Phosphatase: 86 U/L (ref 38–126)
Anion gap: 10 (ref 5–15)
Anion gap: 9 (ref 5–15)
BUN: 6 mg/dL (ref 6–20)
BUN: 6 mg/dL (ref 6–20)
CO2: 23 mmol/L (ref 22–32)
CO2: 24 mmol/L (ref 22–32)
Calcium: 8.1 mg/dL — ABNORMAL LOW (ref 8.9–10.3)
Calcium: 8.3 mg/dL — ABNORMAL LOW (ref 8.9–10.3)
Chloride: 103 mmol/L (ref 98–111)
Chloride: 103 mmol/L (ref 98–111)
Creatinine, Ser: 0.66 mg/dL (ref 0.44–1.00)
Creatinine, Ser: 0.68 mg/dL (ref 0.44–1.00)
GFR calc Af Amer: >60 mL/min (ref >60)
GFR calc Af Amer: >60 mL/min (ref >60)
GFR calc non Af Amer: >60 mL/min (ref >60)
GFR calc non Af Amer: >60 mL/min (ref >60)
Glucose, Bld: 129 mg/dL — ABNORMAL HIGH (ref 70–99)
Glucose, Bld: 130 mg/dL — ABNORMAL HIGH (ref 70–99)
Potassium: 3.9 mmol/L (ref 3.5–5.1)
Potassium: 3.9 mmol/L (ref 3.5–5.1)
Sodium: 135 mmol/L (ref 135–145)
Sodium: 137 mmol/L (ref 135–145)
Total Bilirubin: 0.8 mg/dL (ref 0.3–1.2)
Total Bilirubin: 0.9 mg/dL (ref 0.3–1.2)
Total Protein: 6.8 g/dL (ref 6.5–8.1)
Total Protein: 7.1 g/dL (ref 6.5–8.1)

## 2018-12-03 LAB — URINALYSIS, ROUTINE W REFLEX MICROSCOPIC
Bacteria, UA: NONE SEEN
Bilirubin Urine: NEGATIVE
Glucose, UA: NEGATIVE mg/dL
Ketones, ur: 5 mg/dL — AB
Nitrite: POSITIVE — AB
Protein, ur: NEGATIVE mg/dL
Specific Gravity, Urine: >1.046 — ABNORMAL HIGH (ref 1.005–1.030)
pH: 5 (ref 5.0–8.0)

## 2018-12-03 LAB — PROTIME-INR
INR: 1.2 (ref 0.8–1.2)
Prothrombin Time: 14.9 seconds (ref 11.4–15.2)

## 2018-12-03 LAB — CBC
HCT: 43.2 % (ref 36.0–46.0)
Hemoglobin: 14.2 g/dL (ref 12.0–15.0)
MCH: 32.9 pg (ref 26.0–34.0)
MCHC: 32.9 g/dL (ref 30.0–36.0)
MCV: 100 fL (ref 80.0–100.0)
Platelets: 243 10*3/uL (ref 150–400)
RBC: 4.32 MIL/uL (ref 3.87–5.11)
RDW: 12.6 % (ref 11.5–15.5)
WBC: 8.7 10*3/uL (ref 4.0–10.5)
nRBC: 0 % (ref 0.0–0.2)

## 2018-12-03 LAB — ACETAMINOPHEN LEVEL: Acetaminophen (Tylenol), Serum: <10 ug/mL — ABNORMAL LOW (ref 10–30)

## 2018-12-03 LAB — CK: Total CK: 19 U/L — ABNORMAL LOW (ref 38–234)

## 2018-12-03 LAB — ETHANOL: Alcohol, Ethyl (B): <10 mg/dL (ref <10)

## 2018-12-03 LAB — LIPASE, BLOOD: Lipase: 34 U/L (ref 11–51)

## 2018-12-03 LAB — SARS CORONAVIRUS 2 BY RT PCR (HOSPITAL ORDER, PERFORMED IN ~~LOC~~ HOSPITAL LAB): SARS Coronavirus 2: NEGATIVE

## 2018-12-03 LAB — APTT: aPTT: 33 seconds (ref 24–36)

## 2018-12-03 MED ORDER — HYDROMORPHONE HCL 1 MG/ML IJ SOLN
1.0000 mg | Freq: Once | INTRAMUSCULAR | Status: AC
  Administered 2018-12-03: 1 mg via INTRAVENOUS
  Filled 2018-12-03: qty 1

## 2018-12-03 MED ORDER — PANTOPRAZOLE SODIUM 40 MG PO TBEC: 80.0000 mg | DELAYED_RELEASE_TABLET | Freq: Every day | ORAL | Status: DC

## 2018-12-03 MED ORDER — ONDANSETRON HCL 4 MG/2ML IJ SOLN
4.0000 mg | Freq: Once | INTRAMUSCULAR | Status: AC
  Administered 2018-12-03: 03:00:00 4 mg via INTRAVENOUS
  Filled 2018-12-03: qty 2

## 2018-12-03 MED ORDER — DIPHENHYDRAMINE HCL 25 MG PO CAPS
25.0000 mg | ORAL_CAPSULE | Freq: Every day | ORAL | Status: DC
  Administered 2018-12-03 – 2018-12-07 (×5): 25 mg via ORAL
  Filled 2018-12-03 (×5): qty 1

## 2018-12-03 MED ORDER — ONDANSETRON HCL 4 MG/2ML IJ SOLN
4.0000 mg | Freq: Four times a day (QID) | INTRAMUSCULAR | Status: DC | PRN
  Administered 2018-12-03: 4 mg via INTRAVENOUS
  Filled 2018-12-03 (×2): qty 2

## 2018-12-03 MED ORDER — ESCITALOPRAM OXALATE 10 MG PO TABS
10.0000 mg | ORAL_TABLET | Freq: Every day | ORAL | Status: DC
  Administered 2018-12-03 – 2018-12-07 (×5): 10 mg via ORAL
  Filled 2018-12-03 (×5): qty 1

## 2018-12-03 MED ORDER — PANTOPRAZOLE SODIUM 40 MG PO TBEC
40.0000 mg | DELAYED_RELEASE_TABLET | Freq: Two times a day (BID) | ORAL | Status: DC
  Administered 2018-12-03 – 2018-12-08 (×9): 40 mg via ORAL
  Filled 2018-12-03 (×9): qty 1

## 2018-12-03 MED ORDER — SODIUM CHLORIDE 0.9 % IV SOLN
INTRAVENOUS | Status: DC
  Administered 2018-12-03 – 2018-12-06 (×5): via INTRAVENOUS

## 2018-12-03 MED ORDER — ENOXAPARIN SODIUM 40 MG/0.4ML ~~LOC~~ SOLN
40.0000 mg | Freq: Every day | SUBCUTANEOUS | Status: DC
  Administered 2018-12-03 – 2018-12-08 (×6): 40 mg via SUBCUTANEOUS
  Filled 2018-12-03 (×6): qty 0.4

## 2018-12-03 MED ORDER — CARVEDILOL 6.25 MG PO TABS
6.2500 mg | ORAL_TABLET | Freq: Every evening | ORAL | Status: DC
  Administered 2018-12-03 – 2018-12-07 (×4): 6.25 mg via ORAL
  Filled 2018-12-03 (×5): qty 1

## 2018-12-03 MED ORDER — CLONAZEPAM 1 MG PO TABS
1.0000 mg | ORAL_TABLET | Freq: Every evening | ORAL | Status: DC | PRN
  Administered 2018-12-03 – 2018-12-06 (×4): 1 mg via ORAL
  Filled 2018-12-03 (×4): qty 1

## 2018-12-03 MED ORDER — ACETAMINOPHEN 650 MG RE SUPP: 650.0000 mg | Freq: Four times a day (QID) | RECTAL | Status: DC | PRN

## 2018-12-03 MED ORDER — MONTELUKAST SODIUM 10 MG PO TABS
10.0000 mg | ORAL_TABLET | Freq: Every day | ORAL | Status: DC
  Administered 2018-12-03 – 2018-12-07 (×5): 10 mg via ORAL
  Filled 2018-12-03 (×5): qty 1

## 2018-12-03 MED ORDER — MELOXICAM 15 MG PO TABS
15.0000 mg | ORAL_TABLET | Freq: Every day | ORAL | Status: DC
  Administered 2018-12-03 – 2018-12-07 (×5): 15 mg via ORAL
  Filled 2018-12-03 (×5): qty 1

## 2018-12-03 MED ORDER — SODIUM CHLORIDE (PF) 0.9 % IJ SOLN
INTRAMUSCULAR | Status: AC
  Administered 2018-12-03: 02:00:00
  Filled 2018-12-03: qty 50

## 2018-12-03 MED ORDER — IOHEXOL 300 MG/ML  SOLN
100.0000 mL | Freq: Once | INTRAMUSCULAR | Status: AC | PRN
  Administered 2018-12-03: 100 mL via INTRAVENOUS

## 2018-12-03 MED ORDER — FLUTICASONE PROPIONATE 50 MCG/ACT NA SUSP
1.0000 | Freq: Every day | NASAL | Status: DC
  Administered 2018-12-03 – 2018-12-07 (×5): 1 via NASAL
  Filled 2018-12-03: qty 16

## 2018-12-03 MED ORDER — OXYCODONE HCL 5 MG PO TABS
5.0000 mg | ORAL_TABLET | ORAL | Status: AC | PRN
  Administered 2018-12-03 – 2018-12-04 (×3): 10 mg via ORAL
  Filled 2018-12-03 (×3): qty 2

## 2018-12-03 MED ORDER — ALBUTEROL SULFATE HFA 108 (90 BASE) MCG/ACT IN AERS: 1.0000 | INHALATION_SPRAY | Freq: Four times a day (QID) | RESPIRATORY_TRACT | Status: DC | PRN

## 2018-12-03 MED ORDER — ALBUTEROL SULFATE (2.5 MG/3ML) 0.083% IN NEBU: 2.5000 mg | INHALATION_SOLUTION | Freq: Four times a day (QID) | RESPIRATORY_TRACT | Status: DC | PRN

## 2018-12-03 MED ORDER — ONDANSETRON HCL 4 MG PO TABS
4.0000 mg | ORAL_TABLET | Freq: Four times a day (QID) | ORAL | Status: DC | PRN
  Administered 2018-12-04: 4 mg via ORAL
  Filled 2018-12-03: qty 1

## 2018-12-03 MED ORDER — ACETAMINOPHEN 325 MG PO TABS
650.0000 mg | ORAL_TABLET | Freq: Four times a day (QID) | ORAL | Status: DC | PRN
  Administered 2018-12-03 (×2): 650 mg via ORAL
  Filled 2018-12-03 (×2): qty 2

## 2018-12-03 MED ORDER — GABAPENTIN 300 MG PO CAPS
900.0000 mg | ORAL_CAPSULE | Freq: Every day | ORAL | Status: DC
  Administered 2018-12-03 – 2018-12-07 (×5): 900 mg via ORAL
  Filled 2018-12-03 (×5): qty 3

## 2018-12-03 NOTE — ED Notes (Signed)
ED TO INPATIENT HANDOFF REPORT  Name/Age/Gender Angie FlemingsJennie L Brown 38 y.o. adult  Code Status    Code Status Orders  (From admission, onward)         Start     Ordered   12/03/18 0307  Full code  Continuous     12/03/18 0308        Code Status History    Date Active Date Inactive Code Status Order ID Comments User Context   10/12/2018 2348 10/13/2018 1324 Full Code [id number]  Barnetta Chapelgbata, Sylvester I, MD Inpatient   Advance Care Planning Activity      Home/SNF/Other Home  Chief Complaint Abd. Pain with nausea and emesis  Level of Care/Admitting Diagnosis ED Disposition    ED Disposition Condition Comment   Admit  [facility] [[id number]]  Level of Care: Med-Surg [16]  Covid Evaluation: Asymptomatic Screening Protocol (No Symptoms)  Diagnosis: Hepatitis [829562][242201]  Admitting Physician: Wyvonnia DuskyGARDNER, JARED M [[id number]]  Attending Physician: Hillary BowGARDNER, JARED M [[id number]]  PT Class (Do Not Modify): Observation [104]  PT Acc Code (Do Not Modify): Observation [10022]       Medical History Past Medical History:  Diagnosis Date  . Asthma   . Bronchitis   . Dysrhythmia    PVCs- stress related  . Hypoglycemia   . PONV (postoperative nausea and vomiting)    pt prefers scop patch    Allergies Allergies  Allergen Reactions  . Loratadine Shortness Of Breath, Swelling and Palpitations    hypoventilation   . Wasp Venom Anaphylaxis  . Sumatriptan Other (See Comments)    sinus pain, sinus on fire and face hurts  . Doxycycline Nausea And Vomiting  . Ibuprofen Nausea And Vomiting    GI upset  . Methocarbamol Rash    IV Location/Drains/Wounds Patient Lines/Drains/Airways Status   Active Line/Drains/Airways    Name:   Placement date:   Placement time:   Site:   Days:   Peripheral IV 12/02/18 Posterior;Right Hand   12/02/18    2208    Hand   1          Labs/Imaging Results for orders placed or performed during the hospital encounter of 12/02/18 (from  the past 48 hour(s))  CBC with Differential     Status: Abnormal   Collection Time: 12/02/18 10:41 PM  Result Value Ref Range   WBC 10.0 4.0 - 10.5 K/uL   RBC 4.67 3.87 - 5.11 MIL/uL   Hemoglobin 15.2 (H) 12.0 - 15.0 g/dL   HCT 13.044.3 86.536.0 - 78.446.0 %   MCV 94.9 80.0 - 100.0 fL   MCH 32.5 26.0 - 34.0 pg   MCHC 34.3 30.0 - 36.0 g/dL   RDW 69.612.7 29.511.5 - 28.415.5 %   Platelets 279 150 - 400 K/uL   nRBC 0.0 0.0 - 0.2 %   Neutrophils Relative % 80 %   Neutro Abs 8.0 (H) 1.7 - 7.7 K/uL   Lymphocytes Relative 12 %   Lymphs Abs 1.2 0.7 - 4.0 K/uL   Monocytes Relative 4 %   Monocytes Absolute 0.4 0.1 - 1.0 K/uL   Eosinophils Relative 3 %   Eosinophils Absolute 0.3 0.0 - 0.5 K/uL   Basophils Relative 1 %   Basophils Absolute 0.1 0.0 - 0.1 K/uL   Immature Granulocytes 0 %   Abs Immature Granulocytes 0.04 0.00 - 0.07 K/uL    Comment: Performed at [facility], [address] [place], [place] [address]  Comprehensive metabolic panel  Status: Abnormal   Collection Time: 12/02/18 11:50 PM  Result Value Ref Range   Sodium 135 135 - 145 mmol/L   Potassium 3.9 3.5 - 5.1 mmol/L   Chloride 103 98 - 111 mmol/L   CO2 23 22 - 32 mmol/L   Glucose, Bld 130 (H) 70 - 99 mg/dL   BUN 6 6 - 20 mg/dL   Creatinine, Ser 6.040.66 0.44 - 1.00 mg/dL   Calcium 8.1 (L) 8.9 - 10.3 mg/dL   Total Protein 6.8 6.5 - 8.1 g/dL   Albumin 3.6 3.5 - 5.0 g/dL   AST 540797 (H) 15 - 41 U/L   ALT 758 (H) 0 - 44 U/L   Alkaline Phosphatase 86 38 - 126 U/L   Total Bilirubin 0.8 0.3 - 1.2 mg/dL   GFR calc non Af Amer >60 >60 mL/min   GFR calc Af Amer >60 >60 mL/min   Anion gap 9 5 - 15    Comment: Performed at [facility], [address] [place], [place] [address]  Lipase, blood     Status: None   Collection Time: 12/02/18 11:50 PM  Result Value Ref Range   Lipase 34 11 - 51 U/L    Comment: Performed at [facility], [address] [place], [place] [address]   Ct Abdomen  Pelvis W Contrast  Result Date: 12/03/2018 CLINICAL DATA:  Abdominal pain, appendicitis suspected, right lower quadrant pain and vomiting for 3 days EXAM: CT ABDOMEN AND PELVIS WITH CONTRAST TECHNIQUE: Multidetector CT imaging of the abdomen and pelvis was performed using the standard protocol following bolus administration of intravenous contrast. CONTRAST:  100mL OMNIPAQUE IOHEXOL 300 MG/ML  SOLN COMPARISON:  CT abdomen pelvis 02/22/2017 FINDINGS: Lower chest: Dependent atelectasis. Lung bases are otherwise clear. Normal heart size. No pericardial effusion. Hepatobiliary: Diffuse hepatic hypoattenuation compatible with hepatic steatosis. No focal liver abnormality is seen. Patient is post cholecystectomy. Slight prominence of the biliary tree likely related to reservoir effect. No calcified intraductal gallstones. Pancreas: Fatty replacement of the pancreas. No pancreatic ductal dilatation or surrounding inflammatory changes. Spleen: Normal in size without focal abnormality. Small accessory splenule Adrenals/Urinary Tract: Normal adrenal glands. Mild bilateral symmetric perinephric stranding, a nonspecific finding though may correlate for decreased renal function. Kidneys are otherwise unremarkable, without renal calculi, suspicious lesion, or hydronephrosis. Bladder is unremarkable. Stomach/Bowel: Distal esophagus, stomach and duodenal sweep are unremarkable. No bowel wall thickening or dilatation. No evidence of obstruction. A normal appendix is visualized. Vascular/Lymphatic: No significant vascular findings are present. No enlarged abdominal or pelvic lymph nodes. Reproductive: Post hysterectomy with retained ovaries. No concerning adnexal lesions. Other: No abdominopelvic free fluid or free gas. No bowel containing hernias. Musculoskeletal: Prior L5-S1 posterior spinal fusion with grade 1 anterolisthesis L5 on S1 stable from prior. IMPRESSION: 1. No evidence of acute intra-abdominal process. Normal  appendix. 2. Hepatic steatosis. 3. Post cholecystectomy and hysterectomy. Electronically Signed   By: Kreg ShropshirePrice  DeHay M.D.   On: 12/03/2018 02:27    Pending Labs Unresulted Labs (From admission, onward)    Start     Ordered   12/03/18 0430  Acetaminophen level  Once,   R     12/03/18 0430   12/03/18 0430  Ethanol  Once,   R     12/03/18 0430   12/03/18 0342  IgG  Once,   STAT     12/03/18 0341   12/03/18 0308  SARS Coronavirus 2 [facility](Hospital order, Performed in [facility] hospital lab) Nasopharyngeal  (  Symptomatic/High Risk of Exposure/Tier 1 Patients Labs with Precautions)  Once,   STAT    Question Answer Comment  Is this test for diagnosis or screening Screening   Symptomatic for COVID-19 as defined by CDC No   Hospitalized for COVID-19 No   Admitted to ICU for COVID-19 No   Previously tested for COVID-19 Yes   Resident in a congregate (group) care setting No   Employed in healthcare setting No   Pregnant No      12/03/18 0307   12/03/18 0304  AntiMicrosomal Ab-Liver / Kidney  Once,   STAT     12/03/18 0306   12/03/18 0303  Mitochondrial antibodies  Once,   STAT     12/03/18 0306   12/03/18 0302  Antinuclear Antibodies, IFA  Once,   STAT     12/03/18 0306   12/03/18 0302  Anti-smooth muscle antibody, IgG  Once,   STAT     12/03/18 0306   12/03/18 0301  Epstein-Barr virus VCA, IgG  (Viral Screen (EBV CAPSID AB+RSV+CMV AB) (PNL))  Once,   STAT     12/03/18 0306   12/03/18 0301  Epstein-Barr virus VCA, IgM  (Viral Screen (EBV CAPSID AB+RSV+CMV AB) (PNL))  Once,   STAT     12/03/18 0306   12/03/18 0301  CMV IgM  (Viral Screen (EBV CAPSID AB+RSV+CMV AB) (PNL))  Once,   STAT     12/03/18 0306   12/03/18 0244  Hepatitis panel, acute  ONCE - STAT,   STAT     12/03/18 0243   12/03/18 0243  Protime-INR  Once,   STAT     12/03/18 0243   12/03/18 0243  APTT  ONCE - STAT,   STAT     12/03/18 0243   12/02/18 2223  Urinalysis, Routine w reflex microscopic  Once,   STAT     12/02/18 2222           Vitals/Pain Today's Vitals   12/02/18 2214 12/03/18 0000 12/03/18 0004 12/03/18 0130  BP: (!) 145/108 (!) 116/96  116/83  Pulse: (!) 121 (!) 101  99  Resp: 16 16  17   Temp: 98.8 F (37.1 C)     TempSrc: Oral     SpO2: 97% 99%  99%  Weight:      Height:      PainSc:   8      Isolation Precautions No active isolations  Medications Medications  carvedilol (COREG) tablet 6.25 mg (has no administration in time range)  albuterol (VENTOLIN HFA) 108 (90 Base) MCG/ACT inhaler 1-2 puff (has no administration in time range)  clonazePAM (KLONOPIN) tablet 1 mg (has no administration in time range)  diphenhydrAMINE (BENADRYL) capsule 25 mg (has no administration in time range)  pantoprazole (PROTONIX) EC tablet 80 mg (has no administration in time range)  montelukast (SINGULAIR) tablet 10 mg (has no administration in time range)  meloxicam (MOBIC) tablet 15 mg (has no administration in time range)  gabapentin (NEURONTIN) capsule 900 mg (has no administration in time range)  escitalopram (LEXAPRO) tablet 10 mg (has no administration in time range)  fluticasone (FLONASE) 50 MCG/ACT nasal spray 1 spray (has no administration in time range)  acetaminophen (TYLENOL) tablet 650 mg (has no administration in time range)    Or  acetaminophen (TYLENOL) suppository 650 mg (has no administration in time range)  ondansetron (ZOFRAN) tablet 4 mg (has no administration in time range)    Or  ondansetron (ZOFRAN) injection 4 mg (  has no administration in time range)  enoxaparin (LOVENOX) injection 40 mg (has no administration in time range)  0.9 %  sodium chloride infusion (has no administration in time range)  ondansetron (ZOFRAN) injection 4 mg (4 mg Intravenous Given 12/02/18 2240)  morphine 4 MG/ML injection 4 mg (4 mg Intravenous Given 12/02/18 2240)  sodium chloride 0.9 % bolus 1,000 mL (0 mLs Intravenous Stopped 12/03/18 0159)  iohexol (OMNIPAQUE) 300 MG/ML solution 100 mL (100 mLs  Intravenous Contrast Given 12/03/18 0204)  sodium chloride (PF) 0.9 % injection (  Given by Other 12/03/18 0217)  HYDROmorphone (DILAUDID) injection 1 mg (1 mg Intravenous Given 12/03/18 0235)  ondansetron (ZOFRAN) injection 4 mg (4 mg Intravenous Given 12/03/18 0235)    Mobility walks

## 2018-12-03 NOTE — Progress Notes (Signed)
Patient having increase in pain right side. Crying uncontrollable hot packs to right side and NP on call paged to night shift RN Sheilah Mins.  Bethann Punches RN

## 2018-12-03 NOTE — Consult Note (Signed)
[facility] Consult: 12:25 PM 12/03/2018  LOS: 0 days    Referring Provider: Dr Isidoro Donningai  Primary Care Physician:  Laqueta DueFurr, Sara M., MD in [facility].   Primary Gastroenterologist:  Gentry FitzUnassigned.      Reason for Consultation:  Elevated transaminases.      HPI: Angie FlemingsJennie L Brown is a 38 y.o. adult.  Hx of obesity.  Asthma.  HLD.  PVCs.  Anxiety, Agorphobia, depression.  Insomnia.  Degenerative spine disease, chronic back pain.  GERD.  IBS. ? Metformin induced diarrhea. Glucose intolerance.  Polycystic ovary dz.   Previous surgeries include cholecystectomy, abdominal hysterectomy 2010, bladder suspension with sling revision.  Laparoscopy with fulguration of endometriosis.  Lumbar spine fusion, tonsillectomy. 02/2009 colonoscopy: for diarrhea, hematochezia:  Non-bleeding int rrhoids.  Random bxs of normal looking colon: benign colonic mucosa. No previous upper endoscopy.  At baseline patient's GERD symptoms are fairly well controlled with OTC Pepcid.  If GERD flares she will switch over to omeprazole.  She takes Mobic every night.  Uses Percocet infrequently for breakthrough back pain.  She tries to avoid Percocet because it gives her constipation no new medications for at least a year.  Started on Lexapro about a year ago and this caused a 35 -40 pound weight gain.  She does not drink alcohol or use illicit drugs.  Patient presented to the ED yesterday with 3 days of vomiting that began Friday.  Every time she ate she would vomit so she stopped eating or drinking much but continued to have initially yellow/bilious emesis and then just dry heaves.  Distally having normal, Brown stools but with the lack of p.o. intake she has not had a bowel movement for a couple of days.  Mild to moderate discomfort in the right mid to upper abdomen  that was worse if she lay on right side, her normal sleep position is on the right side.  Similar discomfort when she would move around.  Her stomach feels "raw". The nausea and vomiting have improved with Zofran.  In fact at lunchtime today she was able to eat chicken pot pie and other solid foods.   AST/ALT 797/758  >> 584/640. T bili normal.  Alkaline phosphatase normal. LFTs normal in 02/2018 Coags, Sodium, potassium, renal function, CBC, APAP level all within normal limits. Leukocytes, nitrites, WBCs present in urine but no bacteria.   Covid 19 negative.   CTAP with contrast shows fatty liver. GB absent.  Slight prominence of biliary tree likely due to reservoir effect.  Fatty replacement of pancreas without PD dilatation or surrounding inflammation. No acute intra-abdominal process. Normal appendix.  Family history of mother, sister, maternal grandfather with fatty liver and depression.  Daughter with anxiety. Social history, 3 children.  She does not see her 2 sons due to custody issues with her ex-husband.  No ETOH or illicit drugs.  No tobacco.       Past Medical History:  Diagnosis Date   Asthma    Bronchitis    Dysrhythmia    PVCs- stress related   Hypoglycemia  PONV (postoperative nausea and vomiting)    pt prefers scop patch    Past Surgical History:  Procedure Laterality Date   ABDOMINAL HYSTERECTOMY     BACK SURGERY  07/2009   spinal fusion L 5-S1   BLADDER SUSPENSION N/A 03/11/2014   Procedure: TRANSVAGINAL TAPE (TVT) PROCEDURE;  Surgeon: Cheri Fowler, MD;  Location: [facility];  Service: Gynecology;  Laterality: N/A;   CHOLECYSTECTOMY     TONSILLECTOMY      Prior to Admission medications   Medication Sig Start Date End Date Taking? Authorizing Provider  albuterol (VENTOLIN HFA) 108 (90 Base) MCG/ACT inhaler Inhale 1-2 puffs into the lungs every 6 (six) hours as needed for wheezing or shortness of breath.   Yes [provider]  carvedilol  (COREG) 6.25 MG tablet Take 6.25 mg by mouth every evening.   Yes [provider]  clonazePAM (KLONOPIN) 1 MG tablet Take 1 mg by mouth at bedtime as needed for anxiety.  06/18/18  Yes [provider]  cyclobenzaprine (FLEXERIL) 10 MG tablet Take 10 mg by mouth at bedtime.    Yes [provider]  diphenhydrAMINE (BENADRYL) 25 mg capsule Take 25 mg by mouth at bedtime.   Yes [provider]  docusate sodium (COLACE) 100 MG capsule Take 100 mg by mouth daily as needed for mild constipation.    Yes [provider]  EPIPEN 2-PAK 0.3 MG/0.3ML SOAJ injection Inject 0.3 mLs (0.3 mg total) into the muscle as needed (for allergic reaction). 10/13/18  Yes Lavina Hamman, MD  escitalopram (LEXAPRO) 10 MG tablet Take 10 mg by mouth at bedtime.   Yes [provider]  famotidine (PEPCID) 20 MG tablet Take 1 tablet (20 mg total) by mouth daily for 6 days. Patient taking differently: Take 20 mg by mouth.  10/13/18 12/02/18 Yes Lavina Hamman, MD  fluticasone [facility]) 50 MCG/ACT nasal spray Place 1 spray into both nostrils at bedtime.   Yes [provider]  gabapentin (NEURONTIN) 300 MG capsule Take 900 mg by mouth at bedtime.   Yes [provider]  meloxicam (MOBIC) 15 MG tablet Take 1 tablet by mouth at bedtime. 04/25/17  Yes [provider]  montelukast (SINGULAIR) 10 MG tablet Take 10 mg by mouth at bedtime.   Yes [provider]  omeprazole (PRILOSEC) 40 MG capsule Take 40 mg by mouth at bedtime.   Yes [provider]  rizatriptan (MAXALT-MLT) 5 MG disintegrating tablet Take 5 mg by mouth as needed for migraine.  09/25/18  Yes [provider]  fexofenadine (ALLEGRA) 60 MG tablet Take 1 tablet (60 mg total) by mouth 2 (two) times daily for 3 days. Patient not taking: Reported on 12/02/2018 10/13/18 10/16/18  Lavina Hamman, MD    Scheduled Meds:  carvedilol  6.25 mg Oral QPM   diphenhydrAMINE  25 mg Oral  QHS   enoxaparin (LOVENOX) injection  40 mg Subcutaneous Daily   escitalopram  10 mg Oral QHS   fluticasone  1 spray Each Nare QHS   gabapentin  900 mg Oral QHS   meloxicam  15 mg Oral QHS   montelukast  10 mg Oral QHS   pantoprazole  80 mg Oral QHS   Infusions:  sodium chloride 125 mL/hr at 12/03/18 0647   PRN Meds: acetaminophen **OR** acetaminophen, albuterol, clonazePAM, ondansetron **OR** ondansetron (ZOFRAN) IV   Allergies as of 12/02/2018 - Review Complete 12/02/2018  Allergen Reaction Noted   Loratadine Shortness Of Breath, Swelling, and Palpitations  01/01/2007   Wasp venom Anaphylaxis 10/12/2018   Sumatriptan Other (See Comments) 01/01/2007   Doxycycline Nausea And Vomiting 03/03/2014   Ibuprofen Nausea And Vomiting 01/01/2007   Methocarbamol Rash 10/02/2014    Family History  Problem Relation Age of Onset   Liver disease Mother        Fatty liver   GI Bleed Mother     Social History   Socioeconomic History   Marital status: Divorced    Spouse name: Not on file   Number of children: Not on file   Years of education: Not on file   Highest education level: Not on file  Occupational History   Not on file  Social Needs   Financial resource strain: Not on file   Food insecurity    Worry: Not on file    Inability: Not on file   Transportation needs    Medical: Not on file    Non-medical: Not on file  Tobacco Use   Smoking status: Current Every Day Smoker    Packs/day: 0.50    Types: Cigarettes   Smokeless tobacco: Never Used  Substance and Sexual Activity   Alcohol use: Yes    Comment: social   Drug use: No   Sexual activity: Yes    Birth control/protection: Surgical  Lifestyle   Physical activity    Days per week: Not on file    Minutes per session: Not on file   Stress: Not on file  Relationships   Social connections    Talks on phone: Not on file    Gets together: Not on file    Attends religious service: Not  on file    Active member of club or organization: Not on file    Attends meetings of clubs or organizations: Not on file    Relationship status: Not on file   Intimate partner violence    Fear of current or ex partner: Not on file    Emotionally abused: Not on file    Physically abused: Not on file    Forced sexual activity: Not on file  Other Topics Concern   Not on file  Social History Narrative   Not on file    REVIEW OF SYSTEMS: Constitutional: Has felt weak with the acute GI symptoms. ENT:  No nose bleeds Pulm: No shortness of breath, no cough. CV:  No palpitations, no LE edema.  No angina. GU:  No hematuria, no frequency.  Urine is very yellow/concentrated but not tea colored. GI: See HPI. Heme: No unusual or excessive bleeding or bruising. Transfusions: None in the epic records. Neuro:  No headaches, no syncope. Psych: Struggles with depression, lately this is been pretty stable on Lexapro. MS: Chronic back pain limits her ability to walk any distance or perform any exercise.  She is in the process of getting approval for implantation of spinal cord stimulator, Dr. Dutch Quint would be doing this..    Derm:  No itching, no rash or sores.  Endocrine:  No sweats or chills.  No polyuria or dysuria Immunization: Not queried. Travel:  None beyond local counties in last several months.    PHYSICAL EXAM: Vital signs in last 24 hours: Vitals:   12/03/18 0300 12/03/18 0457  BP: 118/78 (!) 131/98  Pulse: 97 97  Resp: 17 18  Temp:  (!) 97.4 F (36.3 C)  SpO2: 98% 97%   Wt Readings from Last 3 Encounters:  12/02/18 117.9 kg  10/12/18 114.6 kg  05/14/17 98.9 kg  General: Cooperative, alert, obese, non-ill-appearing but depressed appearing WF. Head: No facial asymmetry or swelling.  No signs of head trauma. Eyes: No scleral icterus or conjunctival pallor. Ears: Not hard of hearing Nose: No congestion, no discharge. Mouth: Tongue piercing x 1 with stud type jewelry in  place at tip of tongue.  Mucosa moist.  Nonspecific white coating around the peripheral edges of the tongue.  Tongue is midline.  Mucosa is pink, moist, clear. Neck: No JVD, no masses, no thyromegaly. Lungs: No dyspnea or cough.  Lungs clear bilaterally. Heart: RRR.  No MRG.  S1, S2 present Abdomen: Soft.  Obese.  Not distended or protuberant.  Moderate tenderness without guarding or rebound in the right upper quadrant.  Bowel sounds normal and active..   Rectal: Deferred Musc/Skeltl: No joint redness, swelling or gross deformities. Extremities: No CCE. Neurologic: Fully awake and alert x3.  Good historian.  Moves all 4 limbs without tremor.  Strength not tested. Skin: No jaundice, no telangiectasia, no suspicious lesions or rash. Nodes: No cervical adenopathy. Psych: Cooperative, affect flat but pleasant.  Fluid speech.  Intake/Output from previous day: 09/14 0701 - 09/15 0700 In: 34.4 [I.V.:34.4] Out: -  Intake/Output this shift: Total I/O In: 120 [P.O.:120] Out: -   LAB RESULTS: Recent Labs    12/02/18 2241 12/03/18 0523  WBC 10.0 8.7  HGB 15.2* 14.2  HCT 44.3 43.2  PLT 279 243   BMET Lab Results  Component Value Date   NA 137 12/03/2018   NA 135 12/02/2018   NA 135 10/13/2018   K 3.9 12/03/2018   K 3.9 12/02/2018   K 4.0 10/13/2018   CL 103 12/03/2018   CL 103 12/02/2018   CL 104 10/13/2018   CO2 24 12/03/2018   CO2 23 12/02/2018   CO2 22 10/13/2018   GLUCOSE 129 (H) 12/03/2018   GLUCOSE 130 (H) 12/02/2018   GLUCOSE 231 (H) 10/13/2018   BUN 6 12/03/2018   BUN 6 12/02/2018   BUN 6 10/13/2018   CREATININE 0.68 12/03/2018   CREATININE 0.66 12/02/2018   CREATININE 0.94 10/13/2018   CALCIUM 8.3 (L) 12/03/2018   CALCIUM 8.1 (L) 12/02/2018   CALCIUM 8.6 (L) 10/13/2018   LFT Recent Labs    12/02/18 2350 12/03/18 0523  PROT 6.8 7.1  ALBUMIN 3.6 3.7  AST 797* 584*  ALT 758* 640*  ALKPHOS 86 85  BILITOT 0.8 0.9   PT/INR Lab Results  Component Value  Date   INR 1.2 12/03/2018   Hepatitis Panel No results for input(s): HEPBSAG, HCVAB, HEPAIGM, HEPBIGM in the last 72 hours. C-Diff No components found for: CDIFF Lipase     Component Value Date/Time   LIPASE 34 12/02/2018 2350    RADIOLOGY STUDIES: Ct Abdomen Pelvis W Contrast  Result Date: 12/03/2018 CLINICAL DATA:  Abdominal pain, appendicitis suspected, right lower quadrant pain and vomiting for 3 days EXAM: CT ABDOMEN AND PELVIS WITH CONTRAST TECHNIQUE: Multidetector CT imaging of the abdomen and pelvis was performed using the standard protocol following bolus administration of intravenous contrast. CONTRAST:  OMNIPAQUE IOHEXOL 300 MG/ML  SOLN COMPARISON:  CT abdomen pelvis 02/22/2017 FINDINGS: Lower chest: Dependent atelectasis. Lung bases are otherwise clear. Normal heart size. No pericardial effusion. Hepatobiliary: Diffuse hepatic hypoattenuation compatible with hepatic steatosis. No focal liver abnormality is seen. Patient is post cholecystectomy. Slight prominence of the biliary tree likely related to reservoir effect. No calcified intraductal gallstones. Pancreas: Fatty replacement of the pancreas. No pancreatic ductal dilatation or surrounding  inflammatory changes. Spleen: Normal in size without focal abnormality. Small accessory splenule Adrenals/Urinary Tract: Normal adrenal glands. Mild bilateral symmetric perinephric stranding, a nonspecific finding though may correlate for decreased renal function. Kidneys are otherwise unremarkable, without renal calculi, suspicious lesion, or hydronephrosis. Bladder is unremarkable. Stomach/Bowel: Distal esophagus, stomach and duodenal sweep are unremarkable. No bowel wall thickening or dilatation. No evidence of obstruction. A normal appendix is visualized. Vascular/Lymphatic: No significant vascular findings are present. No enlarged abdominal or pelvic lymph nodes. Reproductive: Post hysterectomy with retained ovaries. No concerning adnexal  lesions. Other: No abdominopelvic free fluid or free gas. No bowel containing hernias. Musculoskeletal: Prior L5-S1 posterior spinal fusion with grade 1 anterolisthesis L5 on S1 stable from prior. IMPRESSION: 1. No evidence of acute intra-abdominal process. Normal appendix. 2. Hepatic steatosis. 3. Post cholecystectomy and hysterectomy. Electronically Signed   By: Kreg ShropshirePrice  DeHay M.D.   On: 12/03/2018 02:27      IMPRESSION:   *    Transaminitis in setting of acute gastrointestinal illness with nausea vomiting and some right upper quadrant discomfort.  PT/INR, platelets WNL.  Fatty liver, postcholecystectomy with slight prominence of biliary tree on contrasted CT scan.  Acetaminophen level not elevated and patient does not take much in the way of acetaminophen.  Does not consume alcoholic beverages.  No new medications in > 1 year Remote cholecystectomy, no evidence for choledocholithiasis on CTAP.  *    Glucose intolerance vs diabetes.  Blood sugars 231 in late 09/2018 and ~ 130 currently.  *    Degenerative lumbosacral spine disease.  In process of approval for spinal cord stimulator implantation.   PLAN:     *    Labs currently pending include IgG, CMV IgM, Epstein-Barr serologies, acute hepatitis panel, ANA, smooth muscle Ab, mitochondrial Ab, antimicrosomal Ab.   *   Continue supportive care.  Repeat LFTs in the morning   Jennye MoccasinSarah Sima Lindenberger  12/03/2018, 12:25 PM Phone [phone]

## 2018-12-03 NOTE — Progress Notes (Signed)
Patient seen and examined, admitted by Dr. Alcario Drought this morning  Briefly 38 year old female with no significant past medical history except anxiety presented with nausea, vomiting, right-sided abdominal pain for last 3 days.  Unable to hold p.o.'s down.  BP (!) 131/98 (BP Location: Right Arm)   Pulse 97   Temp (!) 97.4 F (36.3 C)   Resp 18   Ht 5\' 7"  (1.702 m)   Wt 117.9 kg   SpO2 97%   BMI 40.72 kg/m   Exam unremarkable except mild right upper quadrant  TTP  Patient admitted with acute transaminitis, unclear etiology - Agree with assessment and plan per Dr. Alcario Drought -CT abdomen pelvis negative for acute intra-abdominal pathology, showed hepatic steatosis -Follow acute hepatitis panel, viral and autoimmune work-up. -Continue supportive treatment, IV fluids -Consulted gastroenterology ([facility]).    Estill Cotta M.D. [facility] 12/03/2018, 11:31 AM  Pager: [phone]

## 2018-12-03 NOTE — H&P (Addendum)
History and Physical    Angie Brown IRS:[id number] DOB: 01-Mar-1981 DOA: 12/02/2018  PCP: Karleen Hampshire., MD  Patient coming from: Home  I have personally briefly reviewed patient's old medical records in [place]  Chief Complaint: Abd pain, N/V  HPI: Angie Brown is a 38 y.o. adult with medical history significant of obesity, anxiety, numerous allergies.  Patient presents to the ED with c/o N/V and R sided abdominal pain.  Symptoms onset 3 days ago, initially as mild discomfort with nausea and vomiting but pain has worsened.  Unable to sleep on her right side (which he usually sleeps on).  Did feel like she had a fever last evening.  Unable to hold POs including liquids down.  No recent travel.  No tylenol use, no drug use.   ED Course: LFTs in the 700s.  CT abd/pelvis without acute findings.  Does have fatty liver.  Gallbladder surgically absent, CBD looks nl.  And despite AST/ALT elevation, Tbili and ALK are nl.   Review of Systems: As per HPI, otherwise all review of systems negative.  Past Medical History:  Diagnosis Date  . Asthma   . Bronchitis   . Dysrhythmia    PVCs- stress related  . Hypoglycemia   . PONV (postoperative nausea and vomiting)    pt prefers scop patch    Past Surgical History:  Procedure Laterality Date  . ABDOMINAL HYSTERECTOMY    . BACK SURGERY  07/2009   spinal fusion L 5-S1  . BLADDER SUSPENSION N/A 03/11/2014   Procedure: TRANSVAGINAL TAPE (TVT) PROCEDURE;  Surgeon: Cheri Fowler, MD;  Location: [facility];  Service: Gynecology;  Laterality: N/A;  . CHOLECYSTECTOMY    . TONSILLECTOMY       reports that he has been smoking cigarettes. He has been smoking about 0.50 packs per day. He has never used smokeless tobacco. He reports current alcohol use. He reports that he does not use drugs.  Allergies  Allergen Reactions  . Loratadine Shortness Of Breath, Swelling and Palpitations    hypoventilation   . Wasp Venom Anaphylaxis  .  Sumatriptan Other (See Comments)    sinus pain, sinus on fire and face hurts  . Doxycycline Nausea And Vomiting  . Ibuprofen Nausea And Vomiting    GI upset  . Methocarbamol Rash    Family History  Problem Relation Age of Onset  . Liver disease Mother        Fatty liver  . GI Bleed Mother      Prior to Admission medications   Medication Sig Start Date End Date Taking? Authorizing Provider  albuterol (VENTOLIN HFA) 108 (90 Base) MCG/ACT inhaler Inhale 1-2 puffs into the lungs every 6 (six) hours as needed for wheezing or shortness of breath.   Yes [provider]  carvedilol (COREG) 6.25 MG tablet Take 6.25 mg by mouth every evening.   Yes [provider]  clonazePAM (KLONOPIN) 1 MG tablet Take 1 mg by mouth at bedtime as needed for anxiety.  06/18/18  Yes [provider]  cyclobenzaprine (FLEXERIL) 10 MG tablet Take 10 mg by mouth at bedtime.    Yes [provider]  diphenhydrAMINE (BENADRYL) 25 mg capsule Take 25 mg by mouth at bedtime.   Yes [provider]  docusate sodium (COLACE) 100 MG capsule Take 100 mg by mouth daily as needed for mild constipation.    Yes [provider]  EPIPEN 2-PAK 0.3 MG/0.3ML SOAJ injection Inject 0.3 mLs (0.3 mg  total) into the muscle as needed (for allergic reaction). 10/13/18  Yes Lavina Hamman, MD  escitalopram (LEXAPRO) 10 MG tablet Take 10 mg by mouth at bedtime.   Yes [provider]  famotidine (PEPCID) 20 MG tablet Take 1 tablet (20 mg total) by mouth daily for 6 days. Patient taking differently: Take 20 mg by mouth.  10/13/18 12/02/18 Yes Lavina Hamman, MD  fluticasone [facility]) 50 MCG/ACT nasal spray Place 1 spray into both nostrils at bedtime.   Yes [provider]  gabapentin (NEURONTIN) 300 MG capsule Take 900 mg by mouth at bedtime.   Yes [provider]  meloxicam (MOBIC) 15 MG tablet Take 1 tablet by mouth at bedtime. 04/25/17  Yes [provider]   montelukast (SINGULAIR) 10 MG tablet Take 10 mg by mouth at bedtime.   Yes [provider]  omeprazole (PRILOSEC) 40 MG capsule Take 40 mg by mouth at bedtime.   Yes [provider]  rizatriptan (MAXALT-MLT) 5 MG disintegrating tablet Take 5 mg by mouth as needed for migraine.  09/25/18  Yes [provider]  fexofenadine (ALLEGRA) 60 MG tablet Take 1 tablet (60 mg total) by mouth 2 (two) times daily for 3 days. Patient not taking: Reported on 12/02/2018 10/13/18 10/16/18  Lavina Hamman, MD    Physical Exam: Vitals:   12/02/18 2212 12/02/18 2214 12/03/18 0000 12/03/18 0130  BP:  (!) 145/108 (!) 116/96 116/83  Pulse:  (!) 121 (!) 101 99  Resp:  _0 Temp:  98.8 F (37.1 C)    TempSrc:  Oral    SpO2:  97% 99% 99%  Weight: 117.9 kg     Height: _1  (1.702 m)       Constitutional: NAD, calm, comfortable Eyes: PERRL, lids and conjunctivae normal ENMT: Mucous membranes are moist. Posterior pharynx clear of any exudate or lesions.Normal dentition.  Neck: normal, supple, no masses, no thyromegaly Respiratory: clear to auscultation bilaterally, no wheezing, no crackles. Normal respiratory effort. No accessory muscle use.  Cardiovascular: Regular rate and rhythm, no murmurs / rubs / gallops. No extremity edema. 2+ pedal pulses. No carotid bruits.  Abdomen: R sided TTP Musculoskeletal: no clubbing / cyanosis. No joint deformity upper and lower extremities. Good ROM, no contractures. Normal muscle tone.  Skin: no rashes, lesions, ulcers. No induration Neurologic: CN 2-12 grossly intact. Sensation intact, DTR normal. Strength 5/5 in all 4.  Psychiatric: Normal judgment and insight. Alert and oriented x 3. Normal mood.    Labs on Admission: I have personally reviewed following labs and imaging studies  CBC: Recent Labs  Lab 12/02/18 2241  WBC 10.0  NEUTROABS 8.0*  HGB 15.2*  HCT 44.3  MCV 94.9  PLT 376   Basic Metabolic Panel: Recent Labs  Lab 12/02/18  2350  NA 135  K 3.9  CL 103  CO2 23  GLUCOSE 130*  BUN 6  CREATININE 0.66  CALCIUM 8.1*   GFR: Estimated Creatinine Clearance (by C-G formula based on SCr of 0.66 mg/dL) Female: 125.3 mL/min Female: 152.2 mL/min Liver Function Tests: Recent Labs  Lab 12/02/18 2350  AST 797*  ALT 758*  ALKPHOS 86  BILITOT 0.8  PROT 6.8  ALBUMIN 3.6   Recent Labs  Lab 12/02/18 2350  LIPASE 34   No results for input(s): AMMONIA in the last 168 hours. Coagulation Profile: No results for input(s): INR, PROTIME in the last 168 hours. Cardiac Enzymes: No results for input(s): CKTOTAL, CKMB, CKMBINDEX,  TROPONINI in the last 168 hours. BNP (last 3 results) No results for input(s): PROBNP in the last 8760 hours. HbA1C: No results for input(s): HGBA1C in the last 72 hours. CBG: No results for input(s): GLUCAP in the last 168 hours. Lipid Profile: No results for input(s): CHOL, HDL, LDLCALC, TRIG, CHOLHDL, LDLDIRECT in the last 72 hours. Thyroid Function Tests: No results for input(s): TSH, T4TOTAL, FREET4, T3FREE, THYROIDAB in the last 72 hours. Anemia Panel: No results for input(s): VITAMINB12, FOLATE, FERRITIN, TIBC, IRON, RETICCTPCT in the last 72 hours. Urine analysis:    Component Value Date/Time   COLORURINE STRAW (A) 10/27/2016 1931   APPEARANCEUR CLEAR 10/27/2016 1931   LABSPEC 1.004 (L) 10/27/2016 1931   PHURINE 8.0 10/27/2016 1931   GLUCOSEU NEGATIVE 10/27/2016 1931   GLUCOSEU NEGATIVE 05/29/2007 1430   HGBUR NEGATIVE 10/27/2016 1931   HGBUR negative 12/23/2008 1131   BILIRUBINUR NEGATIVE 10/27/2016 1931   KETONESUR NEGATIVE 10/27/2016 1931   PROTEINUR NEGATIVE 10/27/2016 1931   UROBILINOGEN 0.2 05/19/2013 2319   NITRITE NEGATIVE 10/27/2016 1931   LEUKOCYTESUR NEGATIVE 10/27/2016 1931    Radiological Exams on Admission: Ct Abdomen Pelvis W Contrast  Result Date: 12/03/2018 CLINICAL DATA:  Abdominal pain, appendicitis suspected, right lower quadrant pain and vomiting  for 3 days EXAM: CT ABDOMEN AND PELVIS WITH CONTRAST TECHNIQUE: Multidetector CT imaging of the abdomen and pelvis was performed using the standard protocol following bolus administration of intravenous contrast. CONTRAST:  131m OMNIPAQUE IOHEXOL 300 MG/ML  SOLN COMPARISON:  CT abdomen pelvis 02/22/2017 FINDINGS: Lower chest: Dependent atelectasis. Lung bases are otherwise clear. Normal heart size. No pericardial effusion. Hepatobiliary: Diffuse hepatic hypoattenuation compatible with hepatic steatosis. No focal liver abnormality is seen. Patient is post cholecystectomy. Slight prominence of the biliary tree likely related to reservoir effect. No calcified intraductal gallstones. Pancreas: Fatty replacement of the pancreas. No pancreatic ductal dilatation or surrounding inflammatory changes. Spleen: Normal in size without focal abnormality. Small accessory splenule Adrenals/Urinary Tract: Normal adrenal glands. Mild bilateral symmetric perinephric stranding, a nonspecific finding though may correlate for decreased renal function. Kidneys are otherwise unremarkable, without renal calculi, suspicious lesion, or hydronephrosis. Bladder is unremarkable. Stomach/Bowel: Distal esophagus, stomach and duodenal sweep are unremarkable. No bowel wall thickening or dilatation. No evidence of obstruction. A normal appendix is visualized. Vascular/Lymphatic: No significant vascular findings are present. No enlarged abdominal or pelvic lymph nodes. Reproductive: Post hysterectomy with retained ovaries. No concerning adnexal lesions. Other: No abdominopelvic free fluid or free gas. No bowel containing hernias. Musculoskeletal: Prior L5-S1 posterior spinal fusion with grade 1 anterolisthesis L5 on S1 stable from prior. IMPRESSION: 1. No evidence of acute intra-abdominal process. Normal appendix. 2. Hepatic steatosis. 3. Post cholecystectomy and hysterectomy. Electronically Signed   By: PLovena LeM.D.   On: 12/03/2018 02:27     EKG: Independently reviewed.  Assessment/Plan Principal Problem:   Hepatitis    1. Acute hepatitis - Viral vs autoimmune vs ? 1. Viral work up: 1. Viral hepatitis pnl 2. Also checking EBV, CMV 2. Starting the autoimmune work up: 1. ANA 2. ASMA 3. Anti-LKM-1 4. AMA 5. IgG level 3. IVF: NS at 125 cc/hr 4. Call GI for consult in AM  DVT prophylaxis: Lovenox Code Status: Full Family Communication: No family in room Disposition Plan: Home after admit Consults called: None Admission status: Place in o81   , JS[facility]ling[facility]ospitalists  How to contact the [facility]ending or Consulting provider [place] covering provider during after  hours 7P -7A, for this patient?  1. Check the care team in [facility] and look for a) attending/consulting [facility] provider listed and b) the [facility] team listed 2. Log into [email or web address]  Amion Physician Scheduling and messaging for groups and whole hospitals  On call and physician scheduling software for group practices, residents, hospitalists and other medical providers for call, clinic, rotation and shift schedules. OnCall Enterprise is a hospital-wide system for scheduling doctors and paging doctors on call. EasyPlot is for scientific plotting and data analysis.  [email or web address]  and use [place]'s universal password to access. If you do not have the password, please contact the hospital operator.  3. Locate the [facility] provider you are looking for under [facility] and page to a number that you can be directly reached. 4. If you still have difficulty reaching the provider, please page the [facility] (Director on Call) for the Hospitalists listed on amion for assistance.  12/03/2018, 3:33 AM

## 2018-12-04 DIAGNOSIS — R1011 Right upper quadrant pain: Secondary | ICD-10-CM

## 2018-12-04 DIAGNOSIS — R112 Nausea with vomiting, unspecified: Secondary | ICD-10-CM

## 2018-12-04 LAB — HEPATITIS PANEL, ACUTE
HCV Ab: <0.1 s/co ratio (ref 0.0–0.9)
Hep A IgM: NEGATIVE
Hep B C IgM: NEGATIVE
Hepatitis B Surface Ag: NEGATIVE

## 2018-12-04 LAB — ANTI-SMOOTH MUSCLE ANTIBODY, IGG: F-Actin IgG: 6 Units (ref 0–19)

## 2018-12-04 LAB — CBC
HCT: 37.9 % (ref 36.0–46.0)
Hemoglobin: 12.1 g/dL (ref 12.0–15.0)
MCH: 32.5 pg (ref 26.0–34.0)
MCHC: 31.9 g/dL (ref 30.0–36.0)
MCV: 101.9 fL — ABNORMAL HIGH (ref 80.0–100.0)
Platelets: 222 10*3/uL (ref 150–400)
RBC: 3.72 MIL/uL — ABNORMAL LOW (ref 3.87–5.11)
RDW: 12.5 % (ref 11.5–15.5)
WBC: 5.7 10*3/uL (ref 4.0–10.5)
nRBC: 0 % (ref 0.0–0.2)

## 2018-12-04 LAB — COMPREHENSIVE METABOLIC PANEL
ALT: 456 U/L — ABNORMAL HIGH (ref 0–44)
AST: 265 U/L — ABNORMAL HIGH (ref 15–41)
Albumin: 3.5 g/dL (ref 3.5–5.0)
Alkaline Phosphatase: 81 U/L (ref 38–126)
Anion gap: 7 (ref 5–15)
BUN: 5 mg/dL — ABNORMAL LOW (ref 6–20)
CO2: 24 mmol/L (ref 22–32)
Calcium: 8 mg/dL — ABNORMAL LOW (ref 8.9–10.3)
Chloride: 106 mmol/L (ref 98–111)
Creatinine, Ser: 0.69 mg/dL (ref 0.44–1.00)
GFR calc Af Amer: >60 mL/min (ref >60)
GFR calc non Af Amer: >60 mL/min (ref >60)
Glucose, Bld: 127 mg/dL — ABNORMAL HIGH (ref 70–99)
Potassium: 3.7 mmol/L (ref 3.5–5.1)
Sodium: 137 mmol/L (ref 135–145)
Total Bilirubin: 0.4 mg/dL (ref 0.3–1.2)
Total Protein: 6.4 g/dL — ABNORMAL LOW (ref 6.5–8.1)

## 2018-12-04 LAB — IGG: IgG (Immunoglobin G), Serum: 955 mg/dL (ref 586–1602)

## 2018-12-04 LAB — ANTI-MICROSOMAL ANTIBODY LIVER / KIDNEY: LKM1 Ab: 1.1 Units (ref 0.0–20.0)

## 2018-12-04 LAB — MITOCHONDRIAL ANTIBODIES: Mitochondrial M2 Ab, IgG: <20 Units (ref 0.0–20.0)

## 2018-12-04 LAB — EPSTEIN-BARR VIRUS VCA, IGM: EBV VCA IgM: <36 U/mL (ref 0.0–35.9)

## 2018-12-04 LAB — ANTINUCLEAR ANTIBODIES, IFA: ANA Ab, IFA: NEGATIVE

## 2018-12-04 LAB — CMV IGM: CMV IgM: <30 AU/mL (ref 0.0–29.9)

## 2018-12-04 LAB — EPSTEIN-BARR VIRUS VCA, IGG: EBV VCA IgG: >600 U/mL — ABNORMAL HIGH (ref 0.0–17.9)

## 2018-12-04 MED ORDER — PROMETHAZINE HCL 25 MG/ML IJ SOLN
12.5000 mg | Freq: Four times a day (QID) | INTRAMUSCULAR | Status: DC | PRN
  Administered 2018-12-04: 25 mg via INTRAVENOUS
  Administered 2018-12-04: 12.5 mg via INTRAVENOUS
  Administered 2018-12-05: 25 mg via INTRAVENOUS
  Administered 2018-12-05: 12.5 mg via INTRAVENOUS
  Administered 2018-12-06: 25 mg via INTRAVENOUS
  Filled 2018-12-04 (×5): qty 1

## 2018-12-04 MED ORDER — OXYCODONE HCL 5 MG PO TABS
5.0000 mg | ORAL_TABLET | ORAL | Status: AC | PRN
  Administered 2018-12-04 – 2018-12-05 (×6): 10 mg via ORAL
  Filled 2018-12-04 (×6): qty 2

## 2018-12-04 MED ORDER — POLYVINYL ALCOHOL 1.4 % OP SOLN
1.0000 [drp] | OPHTHALMIC | Status: DC | PRN
  Administered 2018-12-04: 1 [drp] via OPHTHALMIC
  Filled 2018-12-04: qty 15

## 2018-12-04 MED ORDER — LIP MEDEX EX OINT
TOPICAL_OINTMENT | CUTANEOUS | Status: DC | PRN
  Administered 2018-12-04: 21:00:00 via TOPICAL
  Filled 2018-12-04: qty 7

## 2018-12-04 NOTE — Progress Notes (Signed)
PROGRESS NOTE    Angie Brown  PYK:[id number] DOB: 04-28-1980 DOA: 12/02/2018 PCP: Karleen Hampshire., MD   Brief Narrative: Angie Brown is a 38 y.o. female with a history of obesity, anxiety. Patient presented secondary to abdominal pain with associated nausea and vomiting and found to have acute hepatitis.   Assessment & Plan:   Principal Problem:   Hepatitis Active Problems:   Transaminitis   Acute hepatitis AST/ALT trending down. Abdominal pain unchanged. GI on board. Hepatitis serologies negative. Immune markers negative to date. INR of 1.2 and normal bilirubin. Imaging not suggestive of biliary etiology -GI recommendations -Continue IV fluids; diet per GI recommendations -CMP in AM  Morbid obesity Body mass index is 40.72 kg/m.   DVT prophylaxis: Lovenox Code Status:   Code Status: Full Code Family Communication: None at bedside Disposition Plan: Discharge home pending GI recommendations   Consultants:   [facility]  Procedures:   None  Antimicrobials:  None    Subjective: RUQ abdominal pain. Nausea.  Objective: Vitals:   12/03/18 0457 12/03/18 1451 12/03/18 2156 12/04/18 0515  BP: (!) 131/98 99/65 114/66 112/67  Pulse: 97 81 77 76  Resp: 18 17 16 16   Temp: (!) 97.4 F (36.3 C) (!) 97.5 F (36.4 C) (!) 97.5 F (36.4 C) (!) 97.5 F (36.4 C)  TempSrc:   Oral Oral  SpO2: 97% 99% 99% 100%  Weight:      Height:        Intake/Output Summary (Last 24 hours) at 12/04/2018 1247 Last data filed at 12/04/2018 0955 Gross per 24 hour  Intake 2584.27 ml  Output 1400 ml  Net 1184.27 ml   Filed Weights   12/02/18 2212  Weight: 117.9 kg    Examination:  General exam: Appears calm and comfortable Respiratory system: Clear to auscultation. Respiratory effort normal. Cardiovascular system: S1 & S2 heard, RRR. No murmurs, rubs, gallops or clicks. Gastrointestinal system: Abdomen is nondistended, soft and nontender. No organomegaly or masses felt.  Normal bowel sounds heard. Obese. Central nervous system: Alert and oriented. No focal neurological deficits. Extremities: No edema. No calf tenderness Skin: No cyanosis. No rashes Psychiatry: Judgement and insight appear normal. Mood & affect appropriate.     Data Reviewed: I have personally reviewed following labs and imaging studies  CBC: Recent Labs  Lab 12/02/18 2241 12/03/18 0523 12/04/18 0222  WBC 10.0 8.7 5.7  NEUTROABS 8.0*  --   --   HGB 15.2* 14.2 12.1  HCT 44.3 43.2 37.9  MCV 94.9 100.0 101.9*  PLT 279 243 539   Basic Metabolic Panel: Recent Labs  Lab 12/02/18 2350 12/03/18 0523 12/04/18 0222  NA 135 137 137  K 3.9 3.9 3.7  CL 103 103 106  CO2 23 24 24   GLUCOSE 130* 129* 127*  BUN 6 6 5*  CREATININE 0.66 0.68 0.69  CALCIUM 8.1* 8.3* 8.0*   GFR: Estimated Creatinine Clearance (by C-G formula based on SCr of 0.69 mg/dL) Female: 125.3 mL/min Female: 152.2 mL/min Liver Function Tests: Recent Labs  Lab 12/02/18 2350 12/03/18 0523 12/04/18 0222  AST 797* 584* 265*  ALT 758* 640* 456*  ALKPHOS 86 85 81  BILITOT 0.8 0.9 0.4  PROT 6.8 7.1 6.4*  ALBUMIN 3.6 3.7 3.5   Recent Labs  Lab 12/02/18 2350  LIPASE 34   No results for input(s): AMMONIA in the last 168 hours. Coagulation Profile: Recent Labs  Lab 12/03/18 0356  INR 1.2   Cardiac Enzymes: Recent Labs  Lab 12/03/18 0523  CKTOTAL 19*   BNP (last 3 results) No results for input(s): PROBNP in the last 8760 hours. HbA1C: No results for input(s): HGBA1C in the last 72 hours. CBG: No results for input(s): GLUCAP in the last 168 hours. Lipid Profile: No results for input(s): CHOL, HDL, LDLCALC, TRIG, CHOLHDL, LDLDIRECT in the last 72 hours. Thyroid Function Tests: No results for input(s): TSH, T4TOTAL, FREET4, T3FREE, THYROIDAB in the last 72 hours. Anemia Panel: No results for input(s): VITAMINB12, FOLATE, FERRITIN, TIBC, IRON, RETICCTPCT in the last 72 hours. Sepsis Labs: No results  for input(s): PROCALCITON, LATICACIDVEN in the last 168 hours.  Recent Results (from the past 240 hour(s))  SARS Coronavirus 2 [facility] order, Performed in [facility] hospital lab) Nasopharyngeal     Status: None   Collection Time: 12/03/18  3:56 AM   Specimen: Nasopharyngeal  Result Value Ref Range Status   SARS Coronavirus 2 NEGATIVE NEGATIVE Final    Comment: (NOTE) If result is NEGATIVE SARS-CoV-2 target nucleic acids are NOT DETECTED. The SARS-CoV-2 RNA is generally detectable in upper and lower  respiratory specimens during the acute phase of infection. The lowest  concentration of SARS-CoV-2 viral copies this assay can detect is 250  copies / mL. A negative result does not preclude SARS-CoV-2 infection  and should not be used as the sole basis for treatment or other  patient management decisions.  A negative result may occur with  improper specimen collection / handling, submission of specimen other  than nasopharyngeal swab, presence of viral mutation(s) within the  areas targeted by this assay, and inadequate number of viral copies  (<250 copies / mL). A negative result must be combined with clinical  observations, patient history, and epidemiological information. If result is POSITIVE SARS-CoV-2 target nucleic acids are DETECTED. The SARS-CoV-2 RNA is generally detectable in upper and lower  respiratory specimens dur ing the acute phase of infection.  Positive  results are indicative of active infection with SARS-CoV-2.  Clinical  correlation with patient history and other diagnostic information is  necessary to determine patient infection status.  Positive results do  not rule out bacterial infection or co-infection with other viruses. If result is PRESUMPTIVE POSTIVE SARS-CoV-2 nucleic acids MAY BE PRESENT.   A presumptive positive result was obtained on the submitted specimen  and confirmed on repeat testing.  While 2019 novel coronavirus  (SARS-CoV-2) nucleic acids  may be present in the submitted sample  additional confirmatory testing may be necessary for epidemiological  and / or clinical management purposes  to differentiate between  SARS-CoV-2 and other Sarbecovirus currently known to infect humans.  If clinically indicated additional testing with an alternate test  methodology [phone]) is advised. The SARS-CoV-2 RNA is generally  detectable in upper and lower respiratory sp ecimens during the acute  phase of infection. The expected result is Negative. Fact Sheet for Patients:  BoilerBrush.com.cy Fact Sheet for Healthcare Providers: https://pope.com/ This test is not yet approved or cleared by the [place] FDA and has been authorized for detection and/or diagnosis of SARS-CoV-2 by FDA under an Emergency Use Authorization (EUA).  This EUA will remain in effect (meaning this test can be used) for the duration of the COVID-19 declaration under Section 564(b)(1) of the Act, 21 U.S.C. section 360bbb-3(b)(1), unless the authorization is terminated or revoked sooner. Performed at [facility], [address] [place], [place] [address]          Radiology Studies: Ct Abdomen Pelvis  W Contrast  Result Date: 12/03/2018 CLINICAL DATA:  Abdominal pain, appendicitis suspected, right lower quadrant pain and vomiting for 3 days EXAM: CT ABDOMEN AND PELVIS WITH CONTRAST TECHNIQUE: Multidetector CT imaging of the abdomen and pelvis was performed using the standard protocol following bolus administration of intravenous contrast. CONTRAST:  100mL OMNIPAQUE IOHEXOL 300 MG/ML  SOLN COMPARISON:  CT abdomen pelvis 02/22/2017 FINDINGS: Lower chest: Dependent atelectasis. Lung bases are otherwise clear. Normal heart size. No pericardial effusion. Hepatobiliary: Diffuse hepatic hypoattenuation compatible with hepatic steatosis. No focal liver abnormality is seen. Patient is post cholecystectomy.  Slight prominence of the biliary tree likely related to reservoir effect. No calcified intraductal gallstones. Pancreas: Fatty replacement of the pancreas. No pancreatic ductal dilatation or surrounding inflammatory changes. Spleen: Normal in size without focal abnormality. Small accessory splenule Adrenals/Urinary Tract: Normal adrenal glands. Mild bilateral symmetric perinephric stranding, a nonspecific finding though may correlate for decreased renal function. Kidneys are otherwise unremarkable, without renal calculi, suspicious lesion, or hydronephrosis. Bladder is unremarkable. Stomach/Bowel: Distal esophagus, stomach and duodenal sweep are unremarkable. No bowel wall thickening or dilatation. No evidence of obstruction. A normal appendix is visualized. Vascular/Lymphatic: No significant vascular findings are present. No enlarged abdominal or pelvic lymph nodes. Reproductive: Post hysterectomy with retained ovaries. No concerning adnexal lesions. Other: No abdominopelvic free fluid or free gas. No bowel containing hernias. Musculoskeletal: Prior L5-S1 posterior spinal fusion with grade 1 anterolisthesis L5 on S1 stable from prior. IMPRESSION: 1. No evidence of acute intra-abdominal process. Normal appendix. 2. Hepatic steatosis. 3. Post cholecystectomy and hysterectomy. Electronically Signed   By: Kreg ShropshirePrice  DeHay M.D.   On: 12/03/2018 02:27        Scheduled Meds:  carvedilol  6.25 mg Oral QPM   diphenhydrAMINE  25 mg Oral QHS   enoxaparin (LOVENOX) injection  40 mg Subcutaneous Daily   escitalopram  10 mg Oral QHS   fluticasone  1 spray Each Nare QHS   gabapentin  900 mg Oral QHS   meloxicam  15 mg Oral QHS   montelukast  10 mg Oral QHS   pantoprazole  40 mg Oral BID   Continuous Infusions:  sodium chloride 125 mL/hr at 12/04/18 1226     LOS: 1 day     Jacquelin Hawkingalph Burlie Cajamarca, MD [facility] 12/04/2018, 12:47 PM  If 7PM-7AM, please contact night-coverage [email or web address]

## 2018-12-04 NOTE — Progress Notes (Signed)
Patient ID: Angie Brown, adult   DOB: [birth year], 38 y.o.   MRN: [id number]    Progress Note   Subjective  Day # 2 CC ;abd pain, N/V, elevated LFT's  Feels a bit better - no pain meds since last evening,  ate breakfast , no vomiting and less nausea- requesting Phenergan for nausea- says Zofran heas never worked well for her   Tbili / alk phos 81/AST 265/ALT 456 Covid - neg CMV IGM -neg' EBV -pend Autoimmune labs pend Acute hep panel -pend INR 1.2  Ct abd - hepatic steatosis , s/p GB, no ductal dilation     Objective   Vital signs in last 24 hours: Temp:  [97.5 F (36.4 C)] 97.5 F (36.4 C) (09/16 0515) Pulse Rate:  [76-81] 76 (09/16 0515) Resp:  [16-17] 16 (09/16 0515) BP: (99-114)/(65-67) 112/67 (09/16 0515) SpO2:  [99 %-100 %] 100 % (09/16 0515) Last BM Date: 12/01/18 General:   Obese  white female in NAD Heart:  Regular rate and rhythm; no murmurs Lungs: Respirations even and unlabored, lungs CTA bilaterally Abdomen:  Soft, tender RUQ and lateral right abdomen,and nondistended. Normal bowel sounds. Extremities:  Without edema. Neurologic:  Alert and oriented,  grossly normal neurologically. Psych:  Cooperative. Normal mood and affect.  Intake/Output from previous day: 09/15 0701 - 09/16 0700 In: 3084.3 [P.O.:420; I.V.:2664.3] Out: 2000 [Urine:2000] Intake/Output this shift: Total I/O In: 240 [P.O.:240] Out: -   Lab Results: Recent Labs    12/02/18 2241 12/03/18 0523 12/04/18 0222  WBC 10.0 8.7 5.7  HGB 15.2* 14.2 12.1  HCT 44.3 43.2 37.9  PLT 279 243 222   BMET Recent Labs    12/02/18 2350 12/03/18 0523 12/04/18 0222  NA 135 137 137  K 3.9 3.9 3.7  CL 103 103 106  CO2 _0 GLUCOSE 130* 129* 127*  BUN 6 6 5*  CREATININE 0.66 0.68 0.69  CALCIUM 8.1* 8.3* 8.0*   LFT Recent Labs    12/04/18 0222  PROT 6.4*  ALBUMIN 3.5  AST 265*  ALT 456*  ALKPHOS 81  BILITOT 0.4   PT/INR Recent Labs    12/03/18 0356  LABPROT 14.9  INR 1.2     Studies/Results: Ct Abdomen Pelvis W Contrast  Result Date: 12/03/2018 CLINICAL DATA:  Abdominal pain, appendicitis suspected, right lower quadrant pain and vomiting for 3 days EXAM: CT ABDOMEN AND PELVIS WITH CONTRAST TECHNIQUE: Multidetector CT imaging of the abdomen and pelvis was performed using the standard protocol following bolus administration of intravenous contrast. CONTRAST:  137m OMNIPAQUE IOHEXOL 300 MG/ML  SOLN COMPARISON:  CT abdomen pelvis 02/22/2017 FINDINGS: Lower chest: Dependent atelectasis. Lung bases are otherwise clear. Normal heart size. No pericardial effusion. Hepatobiliary: Diffuse hepatic hypoattenuation compatible with hepatic steatosis. No focal liver abnormality is seen. Patient is post cholecystectomy. Slight prominence of the biliary tree likely related to reservoir effect. No calcified intraductal gallstones. Pancreas: Fatty replacement of the pancreas. No pancreatic ductal dilatation or surrounding inflammatory changes. Spleen: Normal in size without focal abnormality. Small accessory splenule Adrenals/Urinary Tract: Normal adrenal glands. Mild bilateral symmetric perinephric stranding, a nonspecific finding though may correlate for decreased renal function. Kidneys are otherwise unremarkable, without renal calculi, suspicious lesion, or hydronephrosis. Bladder is unremarkable. Stomach/Bowel: Distal esophagus, stomach and duodenal sweep are unremarkable. No bowel wall thickening or dilatation. No evidence of obstruction. A normal appendix is visualized. Vascular/Lymphatic: No significant vascular findings are present. No enlarged abdominal or pelvic lymph nodes. Reproductive: Post hysterectomy  with retained ovaries. No concerning adnexal lesions. Other: No abdominopelvic free fluid or free gas. No bowel containing hernias. Musculoskeletal: Prior L5-S1 posterior spinal fusion with grade 1 anterolisthesis L5 on S1 stable from prior. IMPRESSION: 1. No evidence of acute  intra-abdominal process. Normal appendix. 2. Hepatic steatosis. 3. Post cholecystectomy and hysterectomy. Electronically Signed   By: Lovena Le M.D.   On: 12/03/2018 02:27       Assessment / Plan:    #64 38 year old female with acute nausea vomiting abdominal pain and elevated LFTs. Patient is status post cholecystectomy, no ductal dilation on CT  Abdominal pain has improved, however she remains nauseated.  She was able to eat this morning.  Suspect an acute hepatitis, probably viral  Multiple labs and serologies pending  Plan; Diet as tolerated Will switch to Phenergan and DC Zofran Continue oxycodone every 4 to 6 hours as needed, encourage patient to minimize.  She has not had a dose in the past 12 hours Further plans and recommendations pending results of multiple hepatic markers/serologies    Principal Problem:   Hepatitis Active Problems:   Transaminitis     LOS: 1 day   Taylan Marez PA-C 12/04/2018, 10:00 AM

## 2018-12-04 NOTE — Plan of Care (Signed)
  Problem: Education: Goal: Knowledge of General Education information will improve Description: Including pain rating scale, medication(s)/side effects and non-pharmacologic comfort measures Outcome: Progressing   Problem: Health Behavior/Discharge Planning: Goal: Ability to manage health-related needs will improve Outcome: Progressing   Problem: Clinical Measurements: Goal: Ability to maintain clinical measurements within normal limits will improve Outcome: Progressing   Problem: Nutrition: Goal: Adequate nutrition will be maintained Outcome: Progressing   Problem: Coping: Goal: Level of anxiety will decrease Outcome: Progressing   Problem: Pain Managment: Goal: General experience of comfort will improve Outcome: Progressing   

## 2018-12-05 ENCOUNTER — Inpatient Hospital Stay (HOSPITAL_COMMUNITY): Payer: Medicaid Other

## 2018-12-05 LAB — HEPATIC FUNCTION PANEL
ALT: 293 U/L — ABNORMAL HIGH (ref 0–44)
AST: 131 U/L — ABNORMAL HIGH (ref 15–41)
Albumin: 3.3 g/dL — ABNORMAL LOW (ref 3.5–5.0)
Alkaline Phosphatase: 73 U/L (ref 38–126)
Bilirubin, Direct: 0.2 mg/dL (ref 0.0–0.2)
Indirect Bilirubin: 0.3 mg/dL (ref 0.3–0.9)
Total Bilirubin: 0.5 mg/dL (ref 0.3–1.2)
Total Protein: 6.2 g/dL — ABNORMAL LOW (ref 6.5–8.1)

## 2018-12-05 MED ORDER — LORAZEPAM 2 MG/ML IJ SOLN
1.0000 mg | Freq: Once | INTRAMUSCULAR | Status: AC
  Administered 2018-12-05: 1 mg via INTRAVENOUS
  Filled 2018-12-05: qty 1

## 2018-12-05 MED ORDER — GADOBUTROL 1 MMOL/ML IV SOLN
10.0000 mL | Freq: Once | INTRAVENOUS | Status: AC | PRN
  Administered 2018-12-05: 10 mL via INTRAVENOUS

## 2018-12-05 MED ORDER — KETOTIFEN FUMARATE 0.025 % OP SOLN
1.0000 [drp] | Freq: Two times a day (BID) | OPHTHALMIC | Status: DC
  Administered 2018-12-05 – 2018-12-07 (×6): 1 [drp] via OPHTHALMIC
  Filled 2018-12-05: qty 5

## 2018-12-05 NOTE — Progress Notes (Signed)
Cordelia Poche, MD was paged regarding the pt's request for PRN Benadryl.

## 2018-12-05 NOTE — Progress Notes (Signed)
PROGRESS NOTE    [name]  YNX:[id number] DOB: 02-09-1981 DOA: 12/02/2018 PCP: Laqueta Due., MD   Brief Narrative: Angie Brown is a 38 y.o. female with a history of obesity, anxiety. Patient presented secondary to abdominal pain with associated nausea and vomiting and found to have acute hepatitis.   Assessment & Plan:   Principal Problem:   Hepatitis Active Problems:   Transaminitis   Acute hepatitis AST/ALT trending down. Abdominal pain unchanged. GI on board. Hepatitis serologies negative. Immune markers negative to date. INR of 1.2 and normal bilirubin. Imaging not suggestive of biliary etiology. Patient with a history of infectious mono and her EBV IgG is elevated; normal IgM. -GI recommendations today -Continue IV fluids; diet per GI recommendations  Eye itching Possibly allergic. No concern for infectious conjunctivitis -Eye drops  Morbid obesity Body mass index is 40.72 kg/m.   DVT prophylaxis: Lovenox Code Status:   Code Status: Full Code Family Communication: None at bedside Disposition Plan: Discharge home pending GI recommendations   Consultants:   [facility]  Procedures:   None  Antimicrobials:  None    Subjective: Still with abdominal pain.  Objective: Vitals:   12/04/18 0515 12/04/18 1446 12/04/18 2119 12/05/18 0546  BP: 112/67 119/80 126/85 (!) 130/93  Pulse: 76 75 86 90  Resp: 16 16 16 16   Temp: (!) 97.5 F (36.4 C) 97.6 F (36.4 C) 98.3 F (36.8 C) 98.2 F (36.8 C)  TempSrc: Oral  Oral Oral  SpO2: 100% 100% 100% 100%  Weight:      Height:        Intake/Output Summary (Last 24 hours) at 12/05/2018 1154 Last data filed at 12/05/2018 1000 Gross per 24 hour  Intake 4317.07 ml  Output 2700 ml  Net 1617.07 ml   Filed Weights   12/02/18 2212  Weight: 117.9 kg    Examination:  General exam: Appears calm and comfortable Eyes: no cunjunctivitis Respiratory system: Clear to auscultation. Respiratory effort normal.  Cardiovascular system: S1 & S2 heard, RRR. No murmurs, rubs, gallops or clicks. Gastrointestinal system: Abdomen is nondistended, soft and tender in right quadrant with some guarding. Normal bowel sounds heard. Central nervous system: Alert and oriented. No focal neurological deficits. Extremities: No edema. No calf tenderness Skin: No cyanosis. No rashes Psychiatry: Judgement and insight appear normal. Mood & affect appropriate.     Data Reviewed: I have personally reviewed following labs and imaging studies  CBC: Recent Labs  Lab 12/02/18 2241 12/03/18 0523 12/04/18 0222  WBC 10.0 8.7 5.7  NEUTROABS 8.0*  --   --   HGB 15.2* 14.2 12.1  HCT 44.3 43.2 37.9  MCV 94.9 100.0 101.9*  PLT 279 243 222   Basic Metabolic Panel: Recent Labs  Lab 12/02/18 2350 12/03/18 0523 12/04/18 0222  NA 135 137 137  K 3.9 3.9 3.7  CL 103 103 106  CO2 23 24 24   GLUCOSE 130* 129* 127*  BUN 6 6 5*  CREATININE 0.66 0.68 0.69  CALCIUM 8.1* 8.3* 8.0*   GFR: Estimated Creatinine Clearance (by C-G formula based on SCr of 0.69 mg/dL) Female: 984.2 mL/min Female: 152.2 mL/min Liver Function Tests: Recent Labs  Lab 12/02/18 2350 12/03/18 0523 12/04/18 0222 12/05/18 0229  AST 797* 584* 265* 131*  ALT 758* 640* 456* 293*  ALKPHOS 86 85 81 73  BILITOT 0.8 0.9 0.4 0.5  PROT 6.8 7.1 6.4* 6.2*  ALBUMIN 3.6 3.7 3.5 3.3*   Recent Labs  Lab 12/02/18 2350  LIPASE 34   No results for input(s): AMMONIA in the last 168 hours. Coagulation Profile: Recent Labs  Lab 12/03/18 0356  INR 1.2   Cardiac Enzymes: Recent Labs  Lab 12/03/18 0523  CKTOTAL 19*   BNP (last 3 results) No results for input(s): PROBNP in the last 8760 hours. HbA1C: No results for input(s): HGBA1C in the last 72 hours. CBG: No results for input(s): GLUCAP in the last 168 hours. Lipid Profile: No results for input(s): CHOL, HDL, LDLCALC, TRIG, CHOLHDL, LDLDIRECT in the last 72 hours. Thyroid Function Tests: No  results for input(s): TSH, T4TOTAL, FREET4, T3FREE, THYROIDAB in the last 72 hours. Anemia Panel: No results for input(s): VITAMINB12, FOLATE, FERRITIN, TIBC, IRON, RETICCTPCT in the last 72 hours. Sepsis Labs: No results for input(s): PROCALCITON, LATICACIDVEN in the last 168 hours.  Recent Results (from the past 240 hour(s))  SARS Coronavirus 2 [facility] order, Performed in [facility] hospital lab) Nasopharyngeal     Status: None   Collection Time: 12/03/18  3:56 AM   Specimen: Nasopharyngeal  Result Value Ref Range Status   SARS Coronavirus 2 NEGATIVE NEGATIVE Final    Comment: (NOTE) If result is NEGATIVE SARS-CoV-2 target nucleic acids are NOT DETECTED. The SARS-CoV-2 RNA is generally detectable in upper and lower  respiratory specimens during the acute phase of infection. The lowest  concentration of SARS-CoV-2 viral copies this assay can detect is 250  copies / mL. A negative result does not preclude SARS-CoV-2 infection  and should not be used as the sole basis for treatment or other  patient management decisions.  A negative result may occur with  improper specimen collection / handling, submission of specimen other  than nasopharyngeal swab, presence of viral mutation(s) within the  areas targeted by this assay, and inadequate number of viral copies  (<250 copies / mL). A negative result must be combined with clinical  observations, patient history, and epidemiological information. If result is POSITIVE SARS-CoV-2 target nucleic acids are DETECTED. The SARS-CoV-2 RNA is generally detectable in upper and lower  respiratory specimens dur ing the acute phase of infection.  Positive  results are indicative of active infection with SARS-CoV-2.  Clinical  correlation with patient history and other diagnostic information is  necessary to determine patient infection status.  Positive results do  not rule out bacterial infection or co-infection with other viruses. If result is  PRESUMPTIVE POSTIVE SARS-CoV-2 nucleic acids MAY BE PRESENT.   A presumptive positive result was obtained on the submitted specimen  and confirmed on repeat testing.  While 2019 novel coronavirus  (SARS-CoV-2) nucleic acids may be present in the submitted sample  additional confirmatory testing may be necessary for epidemiological  and / or clinical management purposes  to differentiate between  SARS-CoV-2 and other Sarbecovirus currently known to infect humans.  If clinically indicated additional testing with an alternate test  methodology [phone]) is advised. The SARS-CoV-2 RNA is generally  detectable in upper and lower respiratory sp ecimens during the acute  phase of infection. The expected result is Negative. Fact Sheet for Patients:  StrictlyIdeas.no Fact Sheet for Healthcare Providers: BankingDealers.co.za This test is not yet approved or cleared by the [place] FDA and has been authorized for detection and/or diagnosis of SARS-CoV-2 by FDA under an Emergency Use Authorization (EUA).  This EUA will remain in effect (meaning this test can be used) for the duration of the COVID-19 declaration under Section 564(b)(1) of the Act, 21 U.S.C. section 360bbb-3(b)(1), unless the authorization  is terminated or revoked sooner. Performed at [facility], [address] [place], [place] [address]          Radiology Studies: No results found.      Scheduled Meds: . carvedilol  6.25 mg Oral QPM  . diphenhydrAMINE  25 mg Oral QHS  . enoxaparin (LOVENOX) injection  40 mg Subcutaneous Daily  . escitalopram  10 mg Oral QHS  . fluticasone  1 spray Each Nare QHS  . gabapentin  900 mg Oral QHS  . meloxicam  15 mg Oral QHS  . montelukast  10 mg Oral QHS  . pantoprazole  40 mg Oral BID   Continuous Infusions: . sodium chloride 125 mL/hr at 12/04/18 1226     LOS: 2 days     Jacquelin Hawkingalph Riven Mabile, MD [facility] 12/05/2018, 11:54 AM  If 7PM-7AM, please contact night-coverage [email or web address]

## 2018-12-05 NOTE — Progress Notes (Signed)
     [facility] Progress Note  CC:  Abdominal pain, N/V, and elevated LFT's  Subjective:  Day #3  Still with some nausea.  Eating some but taking phenergan prior to eating.  Still with RUQ abdominal pain.  ALT 293, AST 131, total bili and ALP normal All liver serologies negative.  Objective:  Vital signs in last 24 hours: Temp:  [97.6 F (36.4 C)-98.3 F (36.8 C)] 98.2 F (36.8 C) (09/17 0546) Pulse Rate:  [75-90] 90 (09/17 0546) Resp:  [16] 16 (09/17 0546) BP: (119-130)/(80-93) 130/93 (09/17 0546) SpO2:  [100 %] 100 % (09/17 0546) Last BM Date: 12/01/18 General:  Alert, Well-developed, in NAD Heart:  Regular rate and rhythm; no murmurs Pulm:  CTAB.  No increased WOB. Abdomen:  Soft, non-distended.  BS present.  RUQ TTP. Extremities:  Without edema. Neurologic:  Alert and oriented x 4;  grossly normal neurologically. Psych:  Alert and cooperative. Normal mood and affect.  Intake/Output from previous day: 09/16 0701 - 09/17 0700 In: 3978.3 [P.O.:1680; I.V.:2298.3] Out: 3400 [Urine:3400]  Lab Results: Recent Labs    12/02/18 2241 12/03/18 0523 12/04/18 0222  WBC 10.0 8.7 5.7  HGB 15.2* 14.2 12.1  HCT 44.3 43.2 37.9  PLT 279 243 222   BMET Recent Labs    12/02/18 2350 12/03/18 0523 12/04/18 0222  NA 135 137 137  K 3.9 3.9 3.7  CL 103 103 106  CO2 23 24 24   GLUCOSE 130* 129* 127*  BUN 6 6 5*  CREATININE 0.66 0.68 0.69  CALCIUM 8.1* 8.3* 8.0*   LFT Recent Labs    12/05/18 0229  PROT 6.2*  ALBUMIN 3.3*  AST 131*  ALT 293*  ALKPHOS 73  BILITOT 0.5  BILIDIR 0.2  IBILI 0.3   PT/INR Recent Labs    12/03/18 0356  LABPROT 14.9  INR 1.2   Hepatitis Panel Recent Labs    12/03/18 0356  HEPBSAG Negative  HCVAB <0.1  HEPAIGM Negative  HEPBIGM Negative   Assessment / Plan: #94 38 year old female with acute nausea, vomiting, abdominal pain and elevated LFTs. Patient is status post cholecystectomy, no ductal dilation on CT.   Abdominal pain has improved but is still present and she remains nauseated.  She was able to eat this morning, but taking phenergan prior to PO intake.  Suspect an acute hepatitis, probably viral.  ? If she passed a gallstone.  Gallbladder removed several years ago for dysfunction, not stones, according to the patient.  All liver serologies negative for acute cause of LFT elevation.  ? MRCP or EGD per Dr. Rush Landmark.   LOS: 2 days   Laban Emperor. Kairen Hallinan  12/05/2018, 9:50 AM

## 2018-12-05 NOTE — Plan of Care (Signed)

## 2018-12-06 DIAGNOSIS — R112 Nausea with vomiting, unspecified: Secondary | ICD-10-CM

## 2018-12-06 DIAGNOSIS — R1110 Vomiting, unspecified: Secondary | ICD-10-CM

## 2018-12-06 DIAGNOSIS — K7581 Nonalcoholic steatohepatitis (NASH): Secondary | ICD-10-CM

## 2018-12-06 DIAGNOSIS — R740 Nonspecific elevation of levels of transaminase and lactic acid dehydrogenase [LDH]: Secondary | ICD-10-CM

## 2018-12-06 LAB — FERRITIN: Ferritin: 184 ng/mL (ref 11–307)

## 2018-12-06 LAB — IRON AND TIBC
Iron: 47 ug/dL (ref 28–170)
Saturation Ratios: 18 % (ref 10.4–31.8)
TIBC: 261 ug/dL (ref 250–450)
UIBC: 214 ug/dL

## 2018-12-06 MED ORDER — OXYCODONE HCL 5 MG PO TABS
5.0000 mg | ORAL_TABLET | ORAL | Status: AC | PRN
  Administered 2018-12-06: 5 mg via ORAL
  Administered 2018-12-06: 10 mg via ORAL
  Filled 2018-12-06: qty 2
  Filled 2018-12-06: qty 1

## 2018-12-06 MED ORDER — PROMETHAZINE HCL 25 MG PO TABS
25.0000 mg | ORAL_TABLET | Freq: Four times a day (QID) | ORAL | Status: DC | PRN
  Administered 2018-12-06 (×2): 25 mg via ORAL
  Filled 2018-12-06 (×2): qty 1

## 2018-12-06 NOTE — Progress Notes (Addendum)
[facility] Progress Note  CC:  Abdominal pain, N/V, and elevated LFT's  Subjective:  Day #4  Still with RUQ abdominal pain and some nausea.  LFT's not rechecked today.  MRCP negative for CBD stone, ductal dilitation, or cause of pain.  Showed hepatic steatosis.  Objective:  Vital signs in last 24 hours: Temp:  [97.4 F (36.3 C)-98.2 F (36.8 C)] 98.2 F (36.8 C) (09/18 0448) Pulse Rate:  [87-95] 95 (09/18 0448) Resp:  [16-18] 18 (09/18 0448) BP: (119-142)/(73-103) 142/73 (09/18 0448) SpO2:  [100 %] 100 % (09/18 0448) Last BM Date: 12/01/18 General:  Alert, Well-developed, in NAD Heart:  Regular rate and rhythm; no murmurs Pulm:  CTAB.  No increased WOB. Abdomen:  Soft, non-distended.  BS present.  RUQ TTP. Extremities:  Without edema. Neurologic:  Alert and oriented x 4;  grossly normal neurologically. Psych:  Alert and cooperative. Normal mood and affect.  Intake/Output from previous day: 09/17 0701 - 09/18 0700 In: 4127.1 [P.O.:1320; I.V.:2807.1] Out: 3200 [Urine:3200]  Lab Results: Recent Labs    12/04/18 0222  WBC 5.7  HGB 12.1  HCT 37.9  PLT 222   BMET Recent Labs    12/04/18 0222  NA 137  K 3.7  CL 106  CO2 24  GLUCOSE 127*  BUN 5*  CREATININE 0.69  CALCIUM 8.0*   LFT Recent Labs    12/05/18 0229  PROT 6.2*  ALBUMIN 3.3*  AST 131*  ALT 293*  ALKPHOS 73  BILITOT 0.5  BILIDIR 0.2  IBILI 0.3   Mr 3d Recon At Scanner  Result Date: 12/06/2018 CLINICAL DATA:  Right upper quadrant pain. Abnormal liver function tests. EXAM: MRI ABDOMEN WITHOUT AND WITH CONTRAST (INCLUDING MRCP) TECHNIQUE: Multiplanar multisequence MR imaging of the abdomen was performed both before and after the administration of intravenous contrast. Heavily T2-weighted images of the biliary and pancreatic ducts were obtained, and three-dimensional MRCP images were rendered by post processing. CONTRAST:  10mL GADAVIST GADOBUTROL 1 MMOL/ML IV SOLN  COMPARISON:  12/03/2018 abdominopelvic CT. FINDINGS: Portions of exam are minimally degraded secondary to motion and patient size. Lower chest: Normal heart size without pericardial or pleural effusion. Hepatobiliary: Moderate to marked hepatic steatosis. Hepatomegaly at 21.4 cm craniocaudal. No cirrhosis. No focal liver lesion. Cholecystectomy. Normal biliary tract, without choledocholithiasis. The common duct is normal, including on 82/3, measuring on the order of 3-4 mm. Pancreas:  Normal, without mass or ductal dilatation. Spleen:  Normal in size, without focal abnormality. Adrenals/Urinary Tract: Normal adrenal glands. Normal kidneys, without hydronephrosis. Stomach/Bowel: Normal stomach and abdominal bowel loops. Vascular/Lymphatic: Normal caliber of the aorta and branch vessels. No abdominal adenopathy. Other:  No ascites. Musculoskeletal: No acute osseous abnormality. IMPRESSION: 1. Hepatic steatosis and hepatomegaly. 2. No other explanation for pain or elevated liver function tests. Electronically Signed   By: Jeronimo GreavesKyle  Talbot M.D.   On: 12/06/2018 07:53   Mr Abdomen Mrcp Vivien RossettiW Wo Contast  Result Date: 12/06/2018 CLINICAL DATA:  Right upper quadrant pain. Abnormal liver function tests. EXAM: MRI ABDOMEN WITHOUT AND WITH CONTRAST (INCLUDING MRCP) TECHNIQUE: Multiplanar multisequence MR imaging of the abdomen was performed both before and after the administration of intravenous contrast. Heavily T2-weighted images of the biliary and pancreatic ducts were obtained, and three-dimensional MRCP images were rendered by post processing. CONTRAST:  10mL GADAVIST GADOBUTROL 1 MMOL/ML IV SOLN COMPARISON:  12/03/2018 abdominopelvic CT. FINDINGS: Portions of exam are minimally degraded secondary to motion and patient size. Lower chest: Normal heart size  without pericardial or pleural effusion. Hepatobiliary: Moderate to marked hepatic steatosis. Hepatomegaly at 21.4 cm craniocaudal. No cirrhosis. No focal liver lesion.  Cholecystectomy. Normal biliary tract, without choledocholithiasis. The common duct is normal, including on 82/3, measuring on the order of 3-4 mm. Pancreas:  Normal, without mass or ductal dilatation. Spleen:  Normal in size, without focal abnormality. Adrenals/Urinary Tract: Normal adrenal glands. Normal kidneys, without hydronephrosis. Stomach/Bowel: Normal stomach and abdominal bowel loops. Vascular/Lymphatic: Normal caliber of the aorta and branch vessels. No abdominal adenopathy. Other:  No ascites. Musculoskeletal: No acute osseous abnormality. IMPRESSION: 1. Hepatic steatosis and hepatomegaly. 2. No other explanation for pain or elevated liver function tests. Electronically Signed   By: Abigail Miyamoto M.D.   On: 12/06/2018 07:53   Assessment / Plan: #38 year old female with acute nausea, vomiting, abdominal pain and elevated LFTs.  Patient is status post cholecystectomy,no ductal dilation on CT.  MRCP negative for CBD stone/dilation and cause of pain.  Still continues with pain and some nausea, but is improved.  Suspect an acute hepatitis, possibly viral.  ? If she passed a gallstone.  Gallbladder removed several years ago for dysfunction, not stones, according to the patient.  All liver serologies negative for acute cause of LFT elevation with exception of alpha 1 antitrypsin, which is still pending.  Does have hepatic steatosis, which can cause some elevation.  Will plan for EGD with Dr. Rush Landmark later today although I think likely will be low yield.  LFT's not rechecked today.   LOS: 3 days   Laban Emperor. Zehr  12/06/2018, 9:37 AM    Attending physician's note   I have taken an interval history, reviewed the chart and examined the patient. I agree with the Advanced Practitioner's note, impression and recommendations.   Persistent abdominal pain and nausea AST and ALT trending down Etiology for transaminitis could be possible subacute liver injury (??meds/alcohol) exacerbated by  underlying nonalcoholic steatohepatitis versus passage of biliary sludge. Patient denies alcohol use or any new medications MRI MRCP negative for biliary dilation or choledocholithiasis  Advised patient to avoid EtOH, over-the-counter herbal remedies, NSAIDs or any hepatotoxins  Plan for EGD later this afternoon by Dr. Rush Landmark for further evaluation of abdominal pain to exclude peptic ulcer disease or gastritis   K. Denzil Magnuson , MD [phone]

## 2018-12-06 NOTE — Progress Notes (Signed)
PROGRESS NOTE    Angie Brown  PPJ:[id number] DOB: 03/26/1980 DOA: 12/02/2018 PCP: Karleen Hampshire., MD   Brief Narrative: Angie Brown is a 38 y.o. female with a history of obesity, anxiety. Patient presented secondary to abdominal pain with associated nausea and vomiting and found to have acute hepatitis.   Assessment & Plan:   Principal Problem:   Hepatitis Active Problems:   Transaminitis   Acute hepatitis AST/ALT trending down. Abdominal pain unchanged. GI on board. Hepatitis serologies negative. Immune markers negative to date. INR of 1.2 and normal bilirubin. Imaging, including MRCP, not suggestive of biliary etiology. Patient with a history of infectious mono and her EBV IgG is elevated; normal IgM. GI considering EGD -GI recommendations today -Continue IV fluids; diet per GI recommendations  Nausea -Phenergan PO  Eye itching Possibly allergic. No concern for infectious conjunctivitis -Eye drops  Morbid obesity Body mass index is 40.72 kg/m.   DVT prophylaxis: Lovenox Code Status:   Code Status: Full Code Family Communication: None at bedside Disposition Plan: Discharge home pending GI recommendations   Consultants:   [facility]  Procedures:   None  Antimicrobials:  None    Subjective: Abdominal pain present but improved.  Objective: Vitals:   12/05/18 0546 12/05/18 1338 12/05/18 2241 12/06/18 0448  BP: (!) 130/93 119/74 (!) 139/103 (!) 142/73  Pulse: 90 92 87 95  Resp: 16 16 16 18   Temp: 98.2 F (36.8 C) (!) 97.4 F (36.3 C) 98.1 F (36.7 C) 98.2 F (36.8 C)  TempSrc: Oral Oral Oral Oral  SpO2: 100% 100% 100% 100%  Weight:      Height:        Intake/Output Summary (Last 24 hours) at 12/06/2018 0949 Last data filed at 12/06/2018 0600 Gross per 24 hour  Intake 3887.1 ml  Output 2900 ml  Net 987.1 ml   Filed Weights   12/02/18 2212  Weight: 117.9 kg    Examination:  General exam: Appears calm and comfortable Respiratory  system: Clear to auscultation. Respiratory effort normal. Cardiovascular system: S1 & S2 heard, RRR. No murmurs, rubs, gallops or clicks. Gastrointestinal system: Abdomen is nondistended, soft and minimally tender. No organomegaly or masses felt. Normal bowel sounds heard. Obese Central nervous system: Alert and oriented. No focal neurological deficits. Extremities: No edema. No calf tenderness Skin: No cyanosis. No rashes Psychiatry: Judgement and insight appear normal. Mood & affect appropriate.     Data Reviewed: I have personally reviewed following labs and imaging studies  CBC: Recent Labs  Lab 12/02/18 2241 12/03/18 0523 12/04/18 0222  WBC 10.0 8.7 5.7  NEUTROABS 8.0*  --   --   HGB 15.2* 14.2 12.1  HCT 44.3 43.2 37.9  MCV 94.9 100.0 101.9*  PLT 279 243 580   Basic Metabolic Panel: Recent Labs  Lab 12/02/18 2350 12/03/18 0523 12/04/18 0222  NA 135 137 137  K 3.9 3.9 3.7  CL 103 103 106  CO2 23 24 24   GLUCOSE 130* 129* 127*  BUN 6 6 5*  CREATININE 0.66 0.68 0.69  CALCIUM 8.1* 8.3* 8.0*   GFR: Estimated Creatinine Clearance (by C-G formula based on SCr of 0.69 mg/dL) Female: 125.3 mL/min Female: 152.2 mL/min Liver Function Tests: Recent Labs  Lab 12/02/18 2350 12/03/18 0523 12/04/18 0222 12/05/18 0229  AST 797* 584* 265* 131*  ALT 758* 640* 456* 293*  ALKPHOS 86 85 81 73  BILITOT 0.8 0.9 0.4 0.5  PROT 6.8 7.1 6.4* 6.2*  ALBUMIN 3.6 3.7  3.5 3.3*   Recent Labs  Lab 12/02/18 2350  LIPASE 34   No results for input(s): AMMONIA in the last 168 hours. Coagulation Profile: Recent Labs  Lab 12/03/18 0356  INR 1.2   Cardiac Enzymes: Recent Labs  Lab 12/03/18 0523  CKTOTAL 19*   BNP (last 3 results) No results for input(s): PROBNP in the last 8760 hours. HbA1C: No results for input(s): HGBA1C in the last 72 hours. CBG: No results for input(s): GLUCAP in the last 168 hours. Lipid Profile: No results for input(s): CHOL, HDL, LDLCALC, TRIG,  CHOLHDL, LDLDIRECT in the last 72 hours. Thyroid Function Tests: No results for input(s): TSH, T4TOTAL, FREET4, T3FREE, THYROIDAB in the last 72 hours. Anemia Panel: Recent Labs    12/06/18 0230  FERRITIN 184  TIBC 261  IRON 47   Sepsis Labs: No results for input(s): PROCALCITON, LATICACIDVEN in the last 168 hours.  Recent Results (from the past 240 hour(s))  SARS Coronavirus 2 [facility](Hospital order, [facility]) Nasopharyngeal     Status: None   Collection Time: 12/03/18  3:56 AM   Specimen: Nasopharyngeal  Result Value Ref Range Status   SARS Coronavirus 2 NEGATIVE NEGATIVE Final    Comment: (NOTE) If result is NEGATIVE SARS-CoV-2 target nucleic acids are NOT DETECTED. The SARS-CoV-2 RNA is generally detectable in upper and lower  respiratory specimens during the acute phase of infection. The lowest  concentration of SARS-CoV-2 viral copies this assay can detect is 250  copies / mL. A negative result does not preclude SARS-CoV-2 infection  and should not be used as the sole basis for treatment or other  patient management decisions.  A negative result may occur with  improper specimen collection / handling, submission of specimen other  than nasopharyngeal swab, presence of viral mutation(s) within the  areas targeted by this assay, and inadequate number of viral copies  (<250 copies / mL). A negative result must be combined with clinical  observations, patient history, and epidemiological information. If result is POSITIVE SARS-CoV-2 target nucleic acids are DETECTED. The SARS-CoV-2 RNA is generally detectable in upper and lower  respiratory specimens dur ing the acute phase of infection.  Positive  results are indicative of active infection with SARS-CoV-2.  Clinical  correlation with patient history and other diagnostic information is  necessary to determine patient infection status.  Positive results do  not rule out bacterial infection or  co-infection with other viruses. If result is PRESUMPTIVE POSTIVE SARS-CoV-2 nucleic acids MAY BE PRESENT.   A presumptive positive result was obtained on the submitted specimen  and confirmed on repeat testing.  While 2019 novel coronavirus  (SARS-CoV-2) nucleic acids may be present in the submitted sample  additional confirmatory testing may be necessary for epidemiological  and / or clinical management purposes  to differentiate between  SARS-CoV-2 and other Sarbecovirus currently known to infect humans.  If clinically indicated additional testing with an alternate test  methodology [id number]) is advised. The SARS-CoV-2 RNA is generally  detectable in upper and lower respiratory sp ecimens during the acute  phase of infection. The expected result is Negative. Fact Sheet for Patients:  BoilerBrush.com.cyhttps://www.fda.gov/media/136312/download Fact Sheet for Healthcare Providers: https://pope.com/https://www.fda.gov/media/136313/download This test is not yet approved or cleared by the [place] FDA and has been authorized for detection and/or diagnosis of SARS-CoV-2 by FDA under an Emergency Use Authorization (EUA).  This EUA will remain in effect (meaning this test can be used) for the duration of the COVID-19  declaration under Section 564(b)(1) of the Act, 21 U.S.C. section 360bbb-3(b)(1), unless the authorization is terminated or revoked sooner. Performed at [facility], [address] [place], [place] [address]          Radiology Studies: Mr 3d Recon At Scanner  Result Date: 12/06/2018 CLINICAL DATA:  Right upper quadrant pain. Abnormal liver function tests. EXAM: MRI ABDOMEN WITHOUT AND WITH CONTRAST (INCLUDING MRCP) TECHNIQUE: Multiplanar multisequence MR imaging of the abdomen was performed both before and after the administration of intravenous contrast. Heavily T2-weighted images of the biliary and pancreatic ducts were obtained, and three-dimensional MRCP images were rendered  by post processing. CONTRAST:  60mL GADAVIST GADOBUTROL 1 MMOL/ML IV SOLN COMPARISON:  12/03/2018 abdominopelvic CT. FINDINGS: Portions of exam are minimally degraded secondary to motion and patient size. Lower chest: Normal heart size without pericardial or pleural effusion. Hepatobiliary: Moderate to marked hepatic steatosis. Hepatomegaly at 21.4 cm craniocaudal. No cirrhosis. No focal liver lesion. Cholecystectomy. Normal biliary tract, without choledocholithiasis. The common duct is normal, including on 82/3, measuring on the order of 3-4 mm. Pancreas:  Normal, without mass or ductal dilatation. Spleen:  Normal in size, without focal abnormality. Adrenals/Urinary Tract: Normal adrenal glands. Normal kidneys, without hydronephrosis. Stomach/Bowel: Normal stomach and abdominal bowel loops. Vascular/Lymphatic: Normal caliber of the aorta and branch vessels. No abdominal adenopathy. Other:  No ascites. Musculoskeletal: No acute osseous abnormality. IMPRESSION: 1. Hepatic steatosis and hepatomegaly. 2. No other explanation for pain or elevated liver function tests. Electronically Signed   By: Jeronimo Greaves M.D.   On: 12/06/2018 07:53   Mr Abdomen Mrcp Vivien Rossetti Contast  Result Date: 12/06/2018 CLINICAL DATA:  Right upper quadrant pain. Abnormal liver function tests. EXAM: MRI ABDOMEN WITHOUT AND WITH CONTRAST (INCLUDING MRCP) TECHNIQUE: Multiplanar multisequence MR imaging of the abdomen was performed both before and after the administration of intravenous contrast. Heavily T2-weighted images of the biliary and pancreatic ducts were obtained, and three-dimensional MRCP images were rendered by post processing. CONTRAST:  21mL GADAVIST GADOBUTROL 1 MMOL/ML IV SOLN COMPARISON:  12/03/2018 abdominopelvic CT. FINDINGS: Portions of exam are minimally degraded secondary to motion and patient size. Lower chest: Normal heart size without pericardial or pleural effusion. Hepatobiliary: Moderate to marked hepatic steatosis.  Hepatomegaly at 21.4 cm craniocaudal. No cirrhosis. No focal liver lesion. Cholecystectomy. Normal biliary tract, without choledocholithiasis. The common duct is normal, including on 82/3, measuring on the order of 3-4 mm. Pancreas:  Normal, without mass or ductal dilatation. Spleen:  Normal in size, without focal abnormality. Adrenals/Urinary Tract: Normal adrenal glands. Normal kidneys, without hydronephrosis. Stomach/Bowel: Normal stomach and abdominal bowel loops. Vascular/Lymphatic: Normal caliber of the aorta and branch vessels. No abdominal adenopathy. Other:  No ascites. Musculoskeletal: No acute osseous abnormality. IMPRESSION: 1. Hepatic steatosis and hepatomegaly. 2. No other explanation for pain or elevated liver function tests. Electronically Signed   By: Jeronimo Greaves M.D.   On: 12/06/2018 07:53        Scheduled Meds:  carvedilol  6.25 mg Oral QPM   diphenhydrAMINE  25 mg Oral QHS   enoxaparin (LOVENOX) injection  40 mg Subcutaneous Daily   escitalopram  10 mg Oral QHS   fluticasone  1 spray Each Nare QHS   gabapentin  900 mg Oral QHS   ketotifen  1 drop Right Eye BID   meloxicam  15 mg Oral QHS   montelukast  10 mg Oral QHS   pantoprazole  40 mg Oral BID   Continuous Infusions:  LOS: 3 days     Jacquelin Hawkingalph Draycen Leichter, MD [facility] 12/06/2018, 9:49 AM  If 7PM-7AM, please contact night-coverage [email or web address]

## 2018-12-07 ENCOUNTER — Encounter (HOSPITAL_COMMUNITY): Payer: Self-pay | Admitting: *Deleted

## 2018-12-07 ENCOUNTER — Inpatient Hospital Stay (HOSPITAL_COMMUNITY): Payer: Medicaid Other | Admitting: Certified Registered"

## 2018-12-07 ENCOUNTER — Encounter (HOSPITAL_COMMUNITY)
Admission: EM | Disposition: A | Discharge status: Home / Self Care | Payer: Self-pay | Source: Home / Self Care | Attending: Family Medicine

## 2018-12-07 DIAGNOSIS — K228 Other specified diseases of esophagus: Secondary | ICD-10-CM

## 2018-12-07 HISTORY — PX: BIOPSY: SHX5522

## 2018-12-07 HISTORY — PX: ESOPHAGOGASTRODUODENOSCOPY (EGD) WITH PROPOFOL: SHX5813

## 2018-12-07 LAB — COMPREHENSIVE METABOLIC PANEL
ALT: 142 U/L — ABNORMAL HIGH (ref 0–44)
AST: 53 U/L — ABNORMAL HIGH (ref 15–41)
Albumin: 3.3 g/dL — ABNORMAL LOW (ref 3.5–5.0)
Alkaline Phosphatase: 75 U/L (ref 38–126)
Anion gap: 7 (ref 5–15)
BUN: <5 mg/dL — ABNORMAL LOW (ref 6–20)
CO2: 27 mmol/L (ref 22–32)
Calcium: 8.6 mg/dL — ABNORMAL LOW (ref 8.9–10.3)
Chloride: 106 mmol/L (ref 98–111)
Creatinine, Ser: 0.67 mg/dL (ref 0.44–1.00)
GFR calc Af Amer: >60 mL/min (ref >60)
GFR calc non Af Amer: >60 mL/min (ref >60)
Glucose, Bld: 105 mg/dL — ABNORMAL HIGH (ref 70–99)
Potassium: 3.7 mmol/L (ref 3.5–5.1)
Sodium: 140 mmol/L (ref 135–145)
Total Bilirubin: 0.7 mg/dL (ref 0.3–1.2)
Total Protein: 6.5 g/dL (ref 6.5–8.1)

## 2018-12-07 SURGERY — ESOPHAGOGASTRODUODENOSCOPY (EGD) WITH PROPOFOL
Anesthesia: Monitor Anesthesia Care

## 2018-12-07 MED ORDER — TRAMADOL HCL 50 MG PO TABS
50.0000 mg | ORAL_TABLET | Freq: Four times a day (QID) | ORAL | Status: DC | PRN
  Administered 2018-12-07: 50 mg via ORAL
  Filled 2018-12-07: qty 1

## 2018-12-07 MED ORDER — SCOPOLAMINE 1 MG/3DAYS TD PT72: 1.0000 | MEDICATED_PATCH | TRANSDERMAL | Status: DC

## 2018-12-07 MED ORDER — SODIUM CHLORIDE 0.9 % IV SOLN
INTRAVENOUS | Status: DC | PRN
  Administered 2018-12-07: 1000 mL via INTRAVENOUS

## 2018-12-07 MED ORDER — SODIUM CHLORIDE 0.9 % IV SOLN: INTRAVENOUS | Status: DC

## 2018-12-07 MED ORDER — LIDOCAINE HCL (CARDIAC) PF 100 MG/5ML IV SOSY
PREFILLED_SYRINGE | INTRAVENOUS | Status: DC | PRN
  Administered 2018-12-07: 20 mg via INTRATRACHEAL
  Administered 2018-12-07: 80 mg via INTRATRACHEAL

## 2018-12-07 MED ORDER — PROPOFOL 500 MG/50ML IV EMUL
INTRAVENOUS | Status: DC | PRN
  Administered 2018-12-07: 30 mg via INTRAVENOUS

## 2018-12-07 MED ORDER — LORATADINE 10 MG PO TABS: 10.0000 mg | ORAL_TABLET | Freq: Every day | ORAL | Status: DC

## 2018-12-07 MED ORDER — FEXOFENADINE HCL 60 MG PO TABS
60.0000 mg | ORAL_TABLET | Freq: Two times a day (BID) | ORAL | Status: DC
  Administered 2018-12-07: 60 mg via ORAL
  Filled 2018-12-07 (×3): qty 1

## 2018-12-07 MED ORDER — LACTATED RINGERS IV SOLN
INTRAVENOUS | Status: DC | PRN
  Administered 2018-12-07: 15:00:00 via INTRAVENOUS

## 2018-12-07 MED ORDER — PROPOFOL 10 MG/ML IV BOLUS
INTRAVENOUS | Status: AC
  Filled 2018-12-07: qty 80

## 2018-12-07 MED ORDER — POLYETHYLENE GLYCOL 3350 17 G PO PACK
17.0000 g | PACK | Freq: Every day | ORAL | Status: DC
  Administered 2018-12-07 – 2018-12-08 (×2): 17 g via ORAL
  Filled 2018-12-07 (×2): qty 1

## 2018-12-07 MED ORDER — SCOPOLAMINE 1 MG/3DAYS TD PT72
MEDICATED_PATCH | TRANSDERMAL | Status: AC
  Filled 2018-12-07: qty 1

## 2018-12-07 MED ORDER — PROPOFOL 500 MG/50ML IV EMUL
INTRAVENOUS | Status: DC | PRN
  Administered 2018-12-07: 135 ug/kg/min via INTRAVENOUS
  Administered 2018-12-07: 120 ug/kg/min via INTRAVENOUS

## 2018-12-07 MED ORDER — SENNOSIDES-DOCUSATE SODIUM 8.6-50 MG PO TABS
1.0000 | ORAL_TABLET | Freq: Two times a day (BID) | ORAL | Status: DC
  Administered 2018-12-07 – 2018-12-08 (×3): 1 via ORAL
  Filled 2018-12-07 (×3): qty 1

## 2018-12-07 SURGICAL SUPPLY — 14 items

## 2018-12-07 NOTE — Anesthesia Postprocedure Evaluation (Signed)
Anesthesia Post Note  Patient: Angie Brown  Procedure(s) Performed: ESOPHAGOGASTRODUODENOSCOPY (EGD) WITH PROPOFOL (N/A ) BIOPSY     Patient location during evaluation: PACU Anesthesia Type: MAC Level of consciousness: awake and alert Pain management: pain level controlled Vital Signs Assessment: post-procedure vital signs reviewed and stable Respiratory status: spontaneous breathing Cardiovascular status: stable Anesthetic complications: no    Last Vitals:  Vitals:   12/07/18 1550 12/07/18 1600  BP: 108/79 (!) 129/91  Pulse:    Resp: (!) 23 (!) 24  Temp:    SpO2: 100% 100%    Last Pain:  Vitals:   12/07/18 1600  TempSrc:   PainSc: 0-No pain                 Nolon Nations

## 2018-12-07 NOTE — Transfer of Care (Signed)
Immediate Anesthesia Transfer of Care Note  Patient: ONISHA CEDENO  Procedure(s) Performed: ESOPHAGOGASTRODUODENOSCOPY (EGD) WITH PROPOFOL (N/A ) BIOPSY  Patient Location: PACU  Anesthesia Type:MAC  Level of Consciousness: oriented, drowsy, patient cooperative and responds to stimulation  Airway & Oxygen Therapy: Patient Spontanous Breathing and Patient connected to face mask oxygen  Post-op Assessment: Report given to RN and Post -op Vital signs reviewed and stable  Post vital signs: Reviewed and stable  Last Vitals:  Vitals Value Taken Time  BP    Temp    Pulse    Resp    SpO2      Last Pain:  Vitals:   12/07/18 1454  TempSrc: Oral  PainSc: 0-No pain      Patients Stated Pain Goal: 3 (70/14/10 3013)  Complications: No apparent anesthesia complications

## 2018-12-07 NOTE — Op Note (Addendum)
[facility] Patient Name: Angie Brown Procedure Date: 12/07/2018 MRN: [id number] Attending MD: Justice Britain , MD Date of Birth: Sep 15, 1980 CSN: [id number] Age: 38 Admit Type: Inpatient Procedure:                Upper GI endoscopy Indications:              Abdominal pain in the right upper quadrant, Nausea                            with vomiting Providers:                Justice Britain, MD, Carlyn Reichert, RN, Laverda Sorenson, Technician, Dayle Points, CRNA Referring MD:             Dr. Lonny Prude ([facility]) Medicines:                Monitored Anesthesia Care Complications:            No immediate complications. Estimated Blood Loss:     Estimated blood loss was minimal. Procedure:                Pre-Anesthesia Assessment:                           - Prior to the procedure, a History and Physical                            was performed, and patient medications and                            allergies were reviewed. The patient's tolerance of                            previous anesthesia was also reviewed. The risks                            and benefits of the procedure and the sedation                            options and risks were discussed with the patient.                            All questions were answered, and informed consent                            was obtained. Prior Anticoagulants: The patient has                            taken no previous anticoagulant or antiplatelet                            agents. ASA Grade Assessment: III - A patient with  severe systemic disease. After reviewing the risks                            and benefits, the patient was deemed in                            satisfactory condition to undergo the procedure.                           After obtaining informed consent, the endoscope was                            passed under direct vision. Throughout the                 procedure, the patient's blood pressure, pulse, and                            oxygen saturations were monitored continuously. The                            [id number]-[id number] ([id number] Olympus gastroscope was                            introduced through the mouth, and advanced to the                            second part of duodenum. The upper GI endoscopy was                            accomplished without difficulty. The patient                            tolerated the procedure. Scope In: Scope Out: Findings:      No gross lesions were noted in the proximal esophagus and in the mid       esophagus.      Scattered islands of salmon-colored mucosa were present at 39 cm. No       other visible abnormalities were present. Biopsies were taken with a       cold forceps for histology to rule out Barrett's.      The Z-line was regular and was found 40 cm from the incisors.      No gross lesions were noted in the entire examined stomach. Biopsies       were taken with a cold forceps for histology and Helicobacter pylori       testing.      No gross lesions were noted in the duodenal bulb, in the first portion       of the duodenum and in the second portion of the duodenum. Biopsies were       taken with a cold forceps for histology to rule out enteropathy Impression:               - No gross lesions in proximal esophagus.                            Salmon-colored mucosa [employer]  at distal esophagus.                            Biopsied to rule out Barrett's. Z-line regular, 40                            cm from the incisors.                           - No gross lesions in the stomach. Biopsied for HP.                           - No gross lesions in the duodenal bulb, in the                            first portion of the duodenum and in the second                            portion of the duodenum. Biopsied for enteropathy. Moderate Sedation:      Not Applicable - Patient had care per  Anesthesia. Recommendation:           - The patient will be observed post-procedure,                            until all discharge criteria are met.                           - Return patient to hospital ward for ongoing care.                           - Observe patient's clinical course.                           - Recommend once daily 40 mg PO PPI x 69-month and                            then decrease to 20 mg daily thereafter.                           - No aspirin, ibuprofen, naproxen, or other                            non-steroidal anti-inflammatory drugs.                           - Resume previous diet.                           - The findings and recommendations were discussed                            with the patient.                           - The findings  and recommendations were discussed                            with the referring physician. Procedure Code(s):        --- Professional ---                           [phone], Esophagogastroduodenoscopy, flexible,                            transoral; with biopsy, single or multiple Diagnosis Code(s):        --- Professional ---                           K22.8, Other specified diseases of esophagus                           R10.11, Right upper quadrant pain                           R11.2, Nausea with vomiting, unspecified CPT copyright 2019 American Medical Association. All rights reserved. The codes documented in this report are preliminary and upon coder review may  be revised to meet current compliance requirements. Justice Britain, MD 12/07/2018 4:00:08 PM Number of Addenda: 0

## 2018-12-07 NOTE — Anesthesia Preprocedure Evaluation (Signed)
Anesthesia Evaluation  Patient identified by MRN, date of birth, ID band Patient awake    Reviewed: Allergy & Precautions, H&P , NPO status , Patient's Chart, lab work & pertinent test results  History of Anesthesia Complications (+) PONV and history of anesthetic complications  Airway Mallampati: II  TM Distance: >3 FB Neck ROM: Full    Dental no notable dental hx. (+) Teeth Intact, Dental Advisory Given   Pulmonary Current Smoker,    Pulmonary exam normal        Cardiovascular Normal cardiovascular exam+ dysrhythmias      Neuro/Psych  Headaches, PSYCHIATRIC DISORDERS Anxiety Depression  Neuromuscular disease    GI/Hepatic GERD  ,(+) Hepatitis -  Endo/Other  negative endocrine ROS  Renal/GU Renal disease     Musculoskeletal   Abdominal Normal abdominal exam  (+) + obese,   Peds  Hematology negative hematology ROS (+) anemia ,   Anesthesia Other Findings   Reproductive/Obstetrics negative OB ROS                             Anesthesia Physical  Anesthesia Plan  ASA: III  Anesthesia Plan: MAC   Post-op Pain Management:    Induction: Intravenous  PONV Risk Score and Plan: 2 and Dexamethasone, Propofol infusion, Ondansetron, Scopolamine patch - Pre-op and Treatment may vary due to age or medical condition  Airway Management Planned:   Additional Equipment:   Intra-op Plan:   Post-operative Plan:   Informed Consent: I have reviewed the patients History and Physical, chart, labs and discussed the procedure including the risks, benefits and alternatives for the proposed anesthesia with the patient or authorized representative who has indicated his/her understanding and acceptance.     Dental advisory given  Plan Discussed with: CRNA  Anesthesia Plan Comments:         Anesthesia Quick Evaluation

## 2018-12-07 NOTE — Progress Notes (Signed)
PROGRESS NOTE    Angie Brown  WUJ:[id number] DOB: 10/27/1980 DOA: 12/02/2018 PCP: Laqueta DueFurr, Sara M., MD   Brief Narrative: Angie Brown is a 38 y.o. female with a history of obesity, anxiety. Patient presented secondary to abdominal pain with associated nausea and vomiting and found to have acute hepatitis.   Assessment & Plan:   Principal Problem:   Hepatitis Active Problems:   Abdominal pain   Transaminitis   Non-intractable vomiting   NASH (nonalcoholic steatohepatitis)   Acute hepatitis AST/ALT trending down. Abdominal pain unchanged. GI on board. Hepatitis serologies negative. Immune markers negative to date. INR of 1.2 and normal bilirubin. Imaging, including MRCP, not suggestive of biliary etiology. Patient with a history of infectious mono and her EBV IgG is elevated; normal IgM. -GI recommendations today: EGD -Continue IV fluids; diet per GI recommendations  Nausea -Phenergan PO prn  Eye itching Possibly allergic. No concern for infectious conjunctivitis -Eye drops, warm compress  Morbid obesity Body mass index is 40.74 kg/m.   DVT prophylaxis: Lovenox Code Status:   Code Status: Full Code Family Communication: None at bedside Disposition Plan: Discharge home pending GI recommendations   Consultants:   [facility]  Procedures:   None  Antimicrobials:  None    Subjective: Abdominal pain continues to improve.  Objective: Vitals:   12/07/18 1344 12/07/18 1454 12/07/18 1550 12/07/18 1600  BP: (!) 144/78 (!) 158/104 108/79 (!) 129/91  Pulse: 77     Resp: 16 (!) 30 (!) 23 (!) 24  Temp: (!) 97.4 F (36.3 C) 98.1 F (36.7 C)    TempSrc: Oral Oral    SpO2: 97% 100% 100% 100%  Weight:  118 kg    Height:  5\' 7"  (1.702 m)      Intake/Output Summary (Last 24 hours) at 12/07/2018 1614 Last data filed at 12/07/2018 1553 Gross per 24 hour  Intake 560 ml  Output 0 ml  Net 560 ml   Filed Weights   12/02/18 2212 12/07/18 1454  Weight: 117.9 kg  118 kg    Examination:  General exam: Appears calm and comfortable Respiratory system: Clear to auscultation. Respiratory effort normal. Cardiovascular system: S1 & S2 heard, RRR. No murmurs, rubs, gallops or clicks. Gastrointestinal system: Abdomen is nondistended, soft and minimal tenderness. No organomegaly or masses felt. Normal bowel sounds heard. Central nervous system: Alert and oriented. No focal neurological deficits. Extremities: No edema. No calf tenderness Skin: No cyanosis. No rashes Psychiatry: Judgement and insight appear normal. Mood & affect appropriate.     Data Reviewed: I have personally reviewed following labs and imaging studies  CBC: Recent Labs  Lab 12/02/18 2241 12/03/18 0523 12/04/18 0222  WBC 10.0 8.7 5.7  NEUTROABS 8.0*  --   --   HGB 15.2* 14.2 12.1  HCT 44.3 43.2 37.9  MCV 94.9 100.0 101.9*  PLT 279 243 222   Basic Metabolic Panel: Recent Labs  Lab 12/02/18 2350 12/03/18 0523 12/04/18 0222 12/07/18 0222  NA 135 137 137 140  K 3.9 3.9 3.7 3.7  CL 103 103 106 106  CO2 23 24 24 27   GLUCOSE 130* 129* 127* 105*  BUN 6 6 5* <5*  CREATININE 0.66 0.68 0.69 0.67  CALCIUM 8.1* 8.3* 8.0* 8.6*   GFR: Estimated Creatinine Clearance (by C-G formula based on SCr of 0.67 mg/dL) Female: 956.2125.5 mL/min Female: 152.4 mL/min Liver Function Tests: Recent Labs  Lab 12/02/18 2350 12/03/18 0523 12/04/18 0222 12/05/18 0229 12/07/18 0222  AST 797* 584*  265* 131* 53*  ALT 758* 640* 456* 293* 142*  ALKPHOS 86 85 81 73 75  BILITOT 0.8 0.9 0.4 0.5 0.7  PROT 6.8 7.1 6.4* 6.2* 6.5  ALBUMIN 3.6 3.7 3.5 3.3* 3.3*   Recent Labs  Lab 12/02/18 2350  LIPASE 34   No results for input(s): AMMONIA in the last 168 hours. Coagulation Profile: Recent Labs  Lab 12/03/18 0356  INR 1.2   Cardiac Enzymes: Recent Labs  Lab 12/03/18 0523  CKTOTAL 19*   BNP (last 3 results) No results for input(s): PROBNP in the last 8760 hours. HbA1C: No results for  input(s): HGBA1C in the last 72 hours. CBG: No results for input(s): GLUCAP in the last 168 hours. Lipid Profile: No results for input(s): CHOL, HDL, LDLCALC, TRIG, CHOLHDL, LDLDIRECT in the last 72 hours. Thyroid Function Tests: No results for input(s): TSH, T4TOTAL, FREET4, T3FREE, THYROIDAB in the last 72 hours. Anemia Panel: Recent Labs    12/06/18 0230  FERRITIN 184  TIBC 261  IRON 47   Sepsis Labs: No results for input(s): PROCALCITON, LATICACIDVEN in the last 168 hours.  Recent Results (from the past 240 hour(s))  SARS Coronavirus 2 [facility] order, Performed in [facility] hospital lab) Nasopharyngeal     Status: None   Collection Time: 12/03/18  3:56 AM   Specimen: Nasopharyngeal  Result Value Ref Range Status   SARS Coronavirus 2 NEGATIVE NEGATIVE Final    Comment: (NOTE) If result is NEGATIVE SARS-CoV-2 target nucleic acids are NOT DETECTED. The SARS-CoV-2 RNA is generally detectable in upper and lower  respiratory specimens during the acute phase of infection. The lowest  concentration of SARS-CoV-2 viral copies this assay can detect is 250  copies / mL. A negative result does not preclude SARS-CoV-2 infection  and should not be used as the sole basis for treatment or other  patient management decisions.  A negative result may occur with  improper specimen collection / handling, submission of specimen other  than nasopharyngeal swab, presence of viral mutation(s) within the  areas targeted by this assay, and inadequate number of viral copies  (<250 copies / mL). A negative result must be combined with clinical  observations, patient history, and epidemiological information. If result is POSITIVE SARS-CoV-2 target nucleic acids are DETECTED. The SARS-CoV-2 RNA is generally detectable in upper and lower  respiratory specimens dur ing the acute phase of infection.  Positive  results are indicative of active infection with SARS-CoV-2.  Clinical  correlation with  patient history and other diagnostic information is  necessary to determine patient infection status.  Positive results do  not rule out bacterial infection or co-infection with other viruses. If result is PRESUMPTIVE POSTIVE SARS-CoV-2 nucleic acids MAY BE PRESENT.   A presumptive positive result was obtained on the submitted specimen  and confirmed on repeat testing.  While 2019 novel coronavirus  (SARS-CoV-2) nucleic acids may be present in the submitted sample  additional confirmatory testing may be necessary for epidemiological  and / or clinical management purposes  to differentiate between  SARS-CoV-2 and other Sarbecovirus currently known to infect humans.  If clinically indicated additional testing with an alternate test  methodology [phone]) is advised. The SARS-CoV-2 RNA is generally  detectable in upper and lower respiratory sp ecimens during the acute  phase of infection. The expected result is Negative. Fact Sheet for Patients:  StrictlyIdeas.no Fact Sheet for Healthcare Providers: BankingDealers.co.za This test is not yet approved or cleared by the [place] FDA and  has been authorized for detection and/or diagnosis of SARS-CoV-2 by FDA under an Emergency Use Authorization (EUA).  This EUA will remain in effect (meaning this test can be used) for the duration of the COVID-19 declaration under Section 564(b)(1) of the Act, 21 U.S.C. section 360bbb-3(b)(1), unless the authorization is terminated or revoked sooner. Performed at [facility], [address] [place], [place] [address]          Radiology Studies: Mr 3d Recon At Scanner  Result Date: 12/06/2018 CLINICAL DATA:  Right upper quadrant pain. Abnormal liver function tests. EXAM: MRI ABDOMEN WITHOUT AND WITH CONTRAST (INCLUDING MRCP) TECHNIQUE: Multiplanar multisequence MR imaging of the abdomen was performed both before and after the  administration of intravenous contrast. Heavily T2-weighted images of the biliary and pancreatic ducts were obtained, and three-dimensional MRCP images were rendered by post processing. CONTRAST:  14mL GADAVIST GADOBUTROL 1 MMOL/ML IV SOLN COMPARISON:  12/03/2018 abdominopelvic CT. FINDINGS: Portions of exam are minimally degraded secondary to motion and patient size. Lower chest: Normal heart size without pericardial or pleural effusion. Hepatobiliary: Moderate to marked hepatic steatosis. Hepatomegaly at 21.4 cm craniocaudal. No cirrhosis. No focal liver lesion. Cholecystectomy. Normal biliary tract, without choledocholithiasis. The common duct is normal, including on 82/3, measuring on the order of 3-4 mm. Pancreas:  Normal, without mass or ductal dilatation. Spleen:  Normal in size, without focal abnormality. Adrenals/Urinary Tract: Normal adrenal glands. Normal kidneys, without hydronephrosis. Stomach/Bowel: Normal stomach and abdominal bowel loops. Vascular/Lymphatic: Normal caliber of the aorta and branch vessels. No abdominal adenopathy. Other:  No ascites. Musculoskeletal: No acute osseous abnormality. IMPRESSION: 1. Hepatic steatosis and hepatomegaly. 2. No other explanation for pain or elevated liver function tests. Electronically Signed   By: Jeronimo Greaves M.D.   On: 12/06/2018 07:53   Mr Abdomen Mrcp Vivien Rossetti Contast  Result Date: 12/06/2018 CLINICAL DATA:  Right upper quadrant pain. Abnormal liver function tests. EXAM: MRI ABDOMEN WITHOUT AND WITH CONTRAST (INCLUDING MRCP) TECHNIQUE: Multiplanar multisequence MR imaging of the abdomen was performed both before and after the administration of intravenous contrast. Heavily T2-weighted images of the biliary and pancreatic ducts were obtained, and three-dimensional MRCP images were rendered by post processing. CONTRAST:  16mL GADAVIST GADOBUTROL 1 MMOL/ML IV SOLN COMPARISON:  12/03/2018 abdominopelvic CT. FINDINGS: Portions of exam are minimally degraded  secondary to motion and patient size. Lower chest: Normal heart size without pericardial or pleural effusion. Hepatobiliary: Moderate to marked hepatic steatosis. Hepatomegaly at 21.4 cm craniocaudal. No cirrhosis. No focal liver lesion. Cholecystectomy. Normal biliary tract, without choledocholithiasis. The common duct is normal, including on 82/3, measuring on the order of 3-4 mm. Pancreas:  Normal, without mass or ductal dilatation. Spleen:  Normal in size, without focal abnormality. Adrenals/Urinary Tract: Normal adrenal glands. Normal kidneys, without hydronephrosis. Stomach/Bowel: Normal stomach and abdominal bowel loops. Vascular/Lymphatic: Normal caliber of the aorta and branch vessels. No abdominal adenopathy. Other:  No ascites. Musculoskeletal: No acute osseous abnormality. IMPRESSION: 1. Hepatic steatosis and hepatomegaly. 2. No other explanation for pain or elevated liver function tests. Electronically Signed   By: Jeronimo Greaves M.D.   On: 12/06/2018 07:53        Scheduled Meds:  carvedilol  6.25 mg Oral QPM   diphenhydrAMINE  25 mg Oral QHS   enoxaparin (LOVENOX) injection  40 mg Subcutaneous Daily   escitalopram  10 mg Oral QHS   fexofenadine  60 mg Oral BID   fluticasone  1 spray Each Nare QHS   gabapentin  900 mg Oral QHS   ketotifen  1 drop Right Eye BID   meloxicam  15 mg Oral QHS   montelukast  10 mg Oral QHS   pantoprazole  40 mg Oral BID   polyethylene glycol  17 g Oral Daily   scopolamine  1 patch Transdermal Q72H   scopolamine       senna-docusate  1 tablet Oral BID   Continuous Infusions:  sodium chloride 1,000 mL (12/07/18 0607)     LOS: 4 days     Jacquelin Hawkingalph Ailene Royal, MD [facility] 12/07/2018, 4:14 PM  If 7PM-7AM, please contact night-coverage [email or web address]

## 2018-12-08 MED ORDER — PROMETHAZINE HCL 25 MG PO TABS: 25.0000 mg | ORAL_TABLET | Freq: Four times a day (QID) | ORAL | 0 refills | Status: DC | PRN

## 2018-12-08 MED ORDER — OXYCODONE HCL 5 MG PO CAPS: 5.0000 mg | ORAL_CAPSULE | Freq: Four times a day (QID) | ORAL | 0 refills | Status: DC | PRN

## 2018-12-08 MED ORDER — OMEPRAZOLE 40 MG PO CPDR: 40.0000 mg | DELAYED_RELEASE_CAPSULE | Freq: Two times a day (BID) | ORAL | 0 refills | Status: DC

## 2018-12-08 MED ORDER — KETOROLAC TROMETHAMINE 30 MG/ML IJ SOLN
15.0000 mg | Freq: Once | INTRAMUSCULAR | Status: AC
  Administered 2018-12-08: 15 mg via INTRAVENOUS
  Filled 2018-12-08: qty 1

## 2018-12-08 NOTE — Discharge Summary (Signed)
Physician Discharge Summary  Angie Brown GGY:[id number] DOB: May 02, 1980 DOA: 12/02/2018  PCP: Karleen Hampshire., MD  Admit date: 12/02/2018 Discharge date: 12/08/2018  Admitted From: Home Disposition: Home  Recommendations for Outpatient Follow-up:  1. Follow up with PCP in 1 week 2. Please obtain BMP/CBC in one week 3. Follow up with GI 4. Please follow up on the following pending results: Ceruloplasmin, hepatitis B surface/core ab, hepatitis A ab, esophageal biopsy, alpha-1 antitrypsin phenotype  Home Health: None Equipment/Devices: None  Discharge Condition: Stable CODE STATUS: Full code Diet recommendation: Sodium restricted   Brief/Interim Summary:  Admission HPI written by Etta Quill, DO   Chief Complaint: Abd pain, N/V  HPI: Angie Brown is a 38 y.o. adult with medical history significant of obesity, anxiety, numerous allergies.  Patient presents to the ED with c/o N/V and R sided abdominal pain.  Symptoms onset 3 days ago, initially as mild discomfort with nausea and vomiting but pain has worsened.  Unable to sleep on her right side (which he usually sleeps on).  Did feel like she had a fever last evening.  Unable to hold POs including liquids down.  No recent travel.  No tylenol use, no drug use.   ED Course: LFTs in the 700s.  CT abd/pelvis without acute findings.  Does have fatty liver.  Gallbladder surgically absent, CBD looks nl.  And despite AST/ALT elevation, Tbili and ALK are nl.    Hospital course:  Acute hepatitis Peak AST/ALT of 797/758 respectively. Trended down during admission with time. GI on board. Hepatitis serologies negative. Immune markers negative to date. INR of 1.2 and normal bilirubin. Imaging, including MRCP, not suggestive of biliary etiology. Patient with a history of infectious mono and her EBV IgG is elevated; normal IgM. Labs pending as mentioned above. Abdominal pain persistent but improved. EGD significant for  salmon-colored mucosa biopsied to rule out Barrett's esophagus. Discharged on oxycodone since she cannot take Percocet for this new acute pain. Outpatient GI follow-up.  Nausea Discharge with Phenergan  NASH As seen on imaging. Discussed need for weight loss. Outpatient follow-up.  Eye itching Possibly allergic. No concern for infectious conjunctivitis. Continue allergic eye drops, warm compress.  Morbid obesity Body mass index is 40.74 kg/m.  Discharge Diagnoses:  Principal Problem:   Hepatitis Active Problems:   Abdominal pain   Transaminitis   Non-intractable vomiting   NASH (nonalcoholic steatohepatitis)    Discharge Instructions   Allergies as of 12/08/2018      Reactions   Loratadine Shortness Of Breath, Swelling, Palpitations   hypoventilation    Wasp Venom Anaphylaxis   Sumatriptan Other (See Comments)   sinus pain, sinus on fire and face hurts   Doxycycline Nausea And Vomiting   Ibuprofen Nausea And Vomiting   GI upset   Methocarbamol Rash      Medication List    STOP taking these medications   meloxicam 15 MG tablet Commonly known as: MOBIC     TAKE these medications   albuterol 108 (90 Base) MCG/ACT inhaler Commonly known as: VENTOLIN HFA Inhale 1-2 puffs into the lungs every 6 (six) hours as needed for wheezing or shortness of breath.   carvedilol 6.25 MG tablet Commonly known as: COREG Take 6.25 mg by mouth every evening.   clonazePAM 1 MG tablet Commonly known as: KLONOPIN Take 1 mg by mouth at bedtime as needed for anxiety.   cyclobenzaprine 10 MG tablet Commonly known as: FLEXERIL Take 10 mg by mouth  at bedtime.   diphenhydrAMINE 25 mg capsule Commonly known as: BENADRYL Take 25 mg by mouth at bedtime.   docusate sodium 100 MG capsule Commonly known as: COLACE Take 100 mg by mouth daily as needed for mild constipation.   EpiPen 2-Pak 0.3 mg/0.3 mL Soaj injection Generic drug: EPINEPHrine Inject 0.3 mLs (0.3 mg total) into  the muscle as needed (for allergic reaction).   escitalopram 10 MG tablet Commonly known as: LEXAPRO Take 10 mg by mouth at bedtime.   famotidine 20 MG tablet Commonly known as: PEPCID Take 1 tablet (20 mg total) by mouth daily for 6 days. What changed: when to take this   fexofenadine 60 MG tablet Commonly known as: ALLEGRA Take 1 tablet (60 mg total) by mouth 2 (two) times daily for 3 days.   fluticasone 50 MCG/ACT nasal spray Commonly known as: FLONASE Place 1 spray into both nostrils at bedtime.   gabapentin 300 MG capsule Commonly known as: NEURONTIN Take 900 mg by mouth at bedtime.   montelukast 10 MG tablet Commonly known as: SINGULAIR Take 10 mg by mouth at bedtime.   omeprazole 40 MG capsule Commonly known as: PRILOSEC Take 1 capsule (40 mg total) by mouth 2 (two) times daily. What changed: when to take this   oxycodone 5 MG capsule Commonly known as: OXY-IR Take 1 capsule (5 mg total) by mouth every 6 (six) hours as needed.   promethazine 25 MG tablet Commonly known as: PHENERGAN Take 1 tablet (25 mg total) by mouth every 6 (six) hours as needed for nausea or vomiting.   rizatriptan 5 MG disintegrating tablet Commonly known as: MAXALT-MLT Take 5 mg by mouth as needed for migraine.      Follow-up Information    Karleen Hampshire., MD. Schedule an appointment as soon as possible for a visit in 1 week(s).   Specialty: Internal Medicine Contact information: [address] [place] [address] [phone]        Mansouraty, Telford Nab., MD. Schedule an appointment as soon as possible for a visit.   Specialties: Gastroenterology, Internal Medicine Contact information: [place] [address] [phone]          Allergies  Allergen Reactions   Loratadine Shortness Of Breath, Swelling and Palpitations    hypoventilation    Wasp Venom Anaphylaxis   Sumatriptan Other (See Comments)    sinus pain, sinus on fire and  face hurts   Doxycycline Nausea And Vomiting   Ibuprofen Nausea And Vomiting    GI upset   Methocarbamol Rash    Consultations:  [facility]   Procedures/Studies: Ct Abdomen Pelvis W Contrast  Result Date: 12/03/2018 CLINICAL DATA:  Abdominal pain, appendicitis suspected, right lower quadrant pain and vomiting for 3 days EXAM: CT ABDOMEN AND PELVIS WITH CONTRAST TECHNIQUE: Multidetector CT imaging of the abdomen and pelvis was performed using the standard protocol following bolus administration of intravenous contrast. CONTRAST:  159m OMNIPAQUE IOHEXOL 300 MG/ML  SOLN COMPARISON:  CT abdomen pelvis 02/22/2017 FINDINGS: Lower chest: Dependent atelectasis. Lung bases are otherwise clear. Normal heart size. No pericardial effusion. Hepatobiliary: Diffuse hepatic hypoattenuation compatible with hepatic steatosis. No focal liver abnormality is seen. Patient is post cholecystectomy. Slight prominence of the biliary tree likely related to reservoir effect. No calcified intraductal gallstones. Pancreas: Fatty replacement of the pancreas. No pancreatic ductal dilatation or surrounding inflammatory changes. Spleen: Normal in size without focal abnormality. Small accessory splenule Adrenals/Urinary Tract: Normal adrenal glands. Mild  bilateral symmetric perinephric stranding, a nonspecific finding though may correlate for decreased renal function. Kidneys are otherwise unremarkable, without renal calculi, suspicious lesion, or hydronephrosis. Bladder is unremarkable. Stomach/Bowel: Distal esophagus, stomach and duodenal sweep are unremarkable. No bowel wall thickening or dilatation. No evidence of obstruction. A normal appendix is visualized. Vascular/Lymphatic: No significant vascular findings are present. No enlarged abdominal or pelvic lymph nodes. Reproductive: Post hysterectomy with retained ovaries. No concerning adnexal lesions. Other: No abdominopelvic free fluid or free gas. No bowel containing  hernias. Musculoskeletal: Prior L5-S1 posterior spinal fusion with grade 1 anterolisthesis L5 on S1 stable from prior. IMPRESSION: 1. No evidence of acute intra-abdominal process. Normal appendix. 2. Hepatic steatosis. 3. Post cholecystectomy and hysterectomy. Electronically Signed   By: Lovena Le M.D.   On: 12/03/2018 02:27   Mr 3d Recon At Scanner  Result Date: 12/06/2018 CLINICAL DATA:  Right upper quadrant pain. Abnormal liver function tests. EXAM: MRI ABDOMEN WITHOUT AND WITH CONTRAST (INCLUDING MRCP) TECHNIQUE: Multiplanar multisequence MR imaging of the abdomen was performed both before and after the administration of intravenous contrast. Heavily T2-weighted images of the biliary and pancreatic ducts were obtained, and three-dimensional MRCP images were rendered by post processing. CONTRAST:  57m GADAVIST GADOBUTROL 1 MMOL/ML IV SOLN COMPARISON:  12/03/2018 abdominopelvic CT. FINDINGS: Portions of exam are minimally degraded secondary to motion and patient size. Lower chest: Normal heart size without pericardial or pleural effusion. Hepatobiliary: Moderate to marked hepatic steatosis. Hepatomegaly at 21.4 cm craniocaudal. No cirrhosis. No focal liver lesion. Cholecystectomy. Normal biliary tract, without choledocholithiasis. The common duct is normal, including on 82/3, measuring on the order of 3-4 mm. Pancreas:  Normal, without mass or ductal dilatation. Spleen:  Normal in size, without focal abnormality. Adrenals/Urinary Tract: Normal adrenal glands. Normal kidneys, without hydronephrosis. Stomach/Bowel: Normal stomach and abdominal bowel loops. Vascular/Lymphatic: Normal caliber of the aorta and branch vessels. No abdominal adenopathy. Other:  No ascites. Musculoskeletal: No acute osseous abnormality. IMPRESSION: 1. Hepatic steatosis and hepatomegaly. 2. No other explanation for pain or elevated liver function tests. Electronically Signed   By: KAbigail MiyamotoM.D.   On: 12/06/2018 07:53   Mr  Abdomen Mrcp WMoise BoringContast  Result Date: 12/06/2018 CLINICAL DATA:  Right upper quadrant pain. Abnormal liver function tests. EXAM: MRI ABDOMEN WITHOUT AND WITH CONTRAST (INCLUDING MRCP) TECHNIQUE: Multiplanar multisequence MR imaging of the abdomen was performed both before and after the administration of intravenous contrast. Heavily T2-weighted images of the biliary and pancreatic ducts were obtained, and three-dimensional MRCP images were rendered by post processing. CONTRAST:  146mGADAVIST GADOBUTROL 1 MMOL/ML IV SOLN COMPARISON:  12/03/2018 abdominopelvic CT. FINDINGS: Portions of exam are minimally degraded secondary to motion and patient size. Lower chest: Normal heart size without pericardial or pleural effusion. Hepatobiliary: Moderate to marked hepatic steatosis. Hepatomegaly at 21.4 cm craniocaudal. No cirrhosis. No focal liver lesion. Cholecystectomy. Normal biliary tract, without choledocholithiasis. The common duct is normal, including on 82/3, measuring on the order of 3-4 mm. Pancreas:  Normal, without mass or ductal dilatation. Spleen:  Normal in size, without focal abnormality. Adrenals/Urinary Tract: Normal adrenal glands. Normal kidneys, without hydronephrosis. Stomach/Bowel: Normal stomach and abdominal bowel loops. Vascular/Lymphatic: Normal caliber of the aorta and branch vessels. No abdominal adenopathy. Other:  No ascites. Musculoskeletal: No acute osseous abnormality. IMPRESSION: 1. Hepatic steatosis and hepatomegaly. 2. No other explanation for pain or elevated liver function tests. Electronically Signed   By: KyAbigail Miyamoto.D.   On: 12/06/2018 07:53  9/19: EGD Findings:      No gross lesions were noted in the proximal esophagus and in the mid       esophagus.      Scattered islands of salmon-colored mucosa were present at 39 cm. No       other visible abnormalities were present. Biopsies were taken with a       cold forceps for histology to rule out Barrett's.      The  Z-line was regular and was found 40 cm from the incisors.      No gross lesions were noted in the entire examined stomach. Biopsies       were taken with a cold forceps for histology and Helicobacter pylori       testing.      No gross lesions were noted in the duodenal bulb, in the first portion       of the duodenum and in the second portion of the duodenum. Biopsies were       taken with a cold forceps for histology to rule out enteropathy Impression:               - No gross lesions in proximal esophagus.                            Salmon-colored mucosa island at distal esophagus.                            Biopsied to rule out Barrett's. Z-line regular, 40                            cm from the incisors.                           - No gross lesions in the stomach. Biopsied for HP.                           - No gross lesions in the duodenal bulb, in the                            first portion of the duodenum and in the second                            portion of the duodenum. Biopsied for enteropathy. Moderate Sedation:      Not Applicable - Patient had care per Anesthesia. Recommendation:           - The patient will be observed post-procedure,                            until all discharge criteria are met.                           - Return patient to hospital ward for ongoing care.                           - Observe patient's clinical course.                           -  Recommend once daily 40 mg PO PPI x 51-month and                            then decrease to 20 mg daily thereafter.                           - No aspirin, ibuprofen, naproxen, or other                            non-steroidal anti-inflammatory drugs.                           - Resume previous diet.                           - The findings and recommendations were discussed                            with the patient.                           - The findings and recommendations were discussed                             with the referring physician.   Subjective: Abdominal pain improved but present.  Discharge Exam: Vitals:   12/07/18 2212 12/08/18 0608  BP: (!) 144/90 (!) 137/92  Pulse: 78 74  Resp: 18 19  Temp: 98.2 F (36.8 C) 97.8 F (36.6 C)  SpO2: 97% 95%   Vitals:   12/07/18 1550 12/07/18 1600 12/07/18 2212 12/08/18 0608  BP: 108/79 (!) 129/91 (!) 144/90 (!) 137/92  Pulse:   78 74  Resp: (!) 23 (!) '24 18 19  '$ Temp:   98.2 F (36.8 C) 97.8 F (36.6 C)  TempSrc:   Oral Oral  SpO2: 100% 100% 97% 95%  Weight:      Height:        General: Pt is alert, awake, not in acute distress Cardiovascular: RRR, S1/S2 +, no rubs, no gallops Respiratory: CTA bilaterally, no wheezing, no rhonchi Abdominal: Soft, RUQ tenderness, ND, bowel sounds + Extremities: no edema, no cyanosis    The results of significant diagnostics from this hospitalization (including imaging, microbiology, ancillary and laboratory) are listed below for reference.     Microbiology: Recent Results (from the past 240 hour(s))  SARS Coronavirus 2 ([facility] Nasopharyngeal     Status: None   Collection Time: 12/03/18  3:56 AM   Specimen: Nasopharyngeal  Result Value Ref Range Status   SARS Coronavirus 2 NEGATIVE NEGATIVE Final    Comment: (NOTE) If result is NEGATIVE SARS-CoV-2 target nucleic acids are NOT DETECTED. The SARS-CoV-2 RNA is generally detectable in upper and lower  respiratory specimens during the acute phase of infection. The lowest  concentration of SARS-CoV-2 viral copies this assay can detect is 250  copies / mL. A negative result does not preclude SARS-CoV-2 infection  and should not be used as the sole basis for treatment or other  patient management decisions.  A negative result may occur with  improper specimen collection / handling, submission of specimen other  than  nasopharyngeal swab, presence of viral mutation(s) within the  areas  targeted by this assay, and inadequate number of viral copies  (<250 copies / mL). A negative result must be combined with clinical  observations, patient history, and epidemiological information. If result is POSITIVE SARS-CoV-2 target nucleic acids are DETECTED. The SARS-CoV-2 RNA is generally detectable in upper and lower  respiratory specimens dur ing the acute phase of infection.  Positive  results are indicative of active infection with SARS-CoV-2.  Clinical  correlation with patient history and other diagnostic information is  necessary to determine patient infection status.  Positive results do  not rule out bacterial infection or co-infection with other viruses. If result is PRESUMPTIVE POSTIVE SARS-CoV-2 nucleic acids MAY BE PRESENT.   A presumptive positive result was obtained on the submitted specimen  and confirmed on repeat testing.  While 2019 novel coronavirus  (SARS-CoV-2) nucleic acids may be present in the submitted sample  additional confirmatory testing may be necessary for epidemiological  and / or clinical management purposes  to differentiate between  SARS-CoV-2 and other Sarbecovirus currently known to infect humans.  If clinically indicated additional testing with an alternate test  methodology [phone]) is advised. The SARS-CoV-2 RNA is generally  detectable in upper and lower respiratory sp ecimens during the acute  phase of infection. The expected result is Negative. Fact Sheet for Patients:  StrictlyIdeas.no Fact Sheet for Healthcare Providers: BankingDealers.co.za This test is not yet approved or cleared by the [place] FDA and has been authorized for detection and/or diagnosis of SARS-CoV-2 by FDA under an Emergency Use Authorization (EUA).  This EUA will remain in effect (meaning this test can be used) for the duration of the COVID-19 declaration under Section 564(b)(1) of the Act, 21 U.S.C. section  360bbb-3(b)(1), unless the authorization is terminated or revoked sooner. Performed at [facility], [place] [address], [place], [place] [address]      Labs: BNP (last 3 results) No results for input(s): BNP in the last 8760 hours. Basic Metabolic Panel: Recent Labs  Lab 12/02/18 2350 12/03/18 0523 12/04/18 0222 12/07/18 0222  NA 135 137 137 140  K 3.9 3.9 3.7 3.7  CL 103 103 106 106  CO2 '23 24 24 27  '$ GLUCOSE 130* 129* 127* 105*  BUN 6 6 5* <5*  CREATININE 0.66 0.68 0.69 0.67  CALCIUM 8.1* 8.3* 8.0* 8.6*   Liver Function Tests: Recent Labs  Lab 12/02/18 2350 12/03/18 0523 12/04/18 0222 12/05/18 0229 12/07/18 0222  AST 797* 584* 265* 131* 53*  ALT 758* 640* 456* 293* 142*  ALKPHOS 86 85 81 73 75  BILITOT 0.8 0.9 0.4 0.5 0.7  PROT 6.8 7.1 6.4* 6.2* 6.5  ALBUMIN 3.6 3.7 3.5 3.3* 3.3*   Recent Labs  Lab 12/02/18 2350  LIPASE 34   No results for input(s): AMMONIA in the last 168 hours. CBC: Recent Labs  Lab 12/02/18 2241 12/03/18 0523 12/04/18 0222  WBC 10.0 8.7 5.7  NEUTROABS 8.0*  --   --   HGB 15.2* 14.2 12.1  HCT 44.3 43.2 37.9  MCV 94.9 100.0 101.9*  PLT 279 243 222   Cardiac Enzymes: Recent Labs  Lab 12/03/18 0523  CKTOTAL 19*   BNP: Invalid input(s): POCBNP CBG: No results for input(s): GLUCAP in the last 168 hours. D-Dimer No results for input(s): DDIMER in the last 72 hours. Hgb A1c No results for input(s): HGBA1C in the last 72 hours. Lipid Profile No results for input(s): CHOL, HDL,  LDLCALC, TRIG, CHOLHDL, LDLDIRECT in the last 72 hours. Thyroid function studies No results for input(s): TSH, T4TOTAL, T3FREE, THYROIDAB in the last 72 hours.  Invalid input(s): FREET3 Anemia work up Recent Labs    12/06/18 0230  FERRITIN 184  TIBC 261  IRON 47   Urinalysis    Component Value Date/Time   COLORURINE YELLOW 12/03/2018 0356   APPEARANCEUR HAZY (A) 12/03/2018 0356   LABSPEC >1.046 (H) 12/03/2018 0356   PHURINE  5.0 12/03/2018 0356   GLUCOSEU NEGATIVE 12/03/2018 0356   GLUCOSEU NEGATIVE 05/29/2007 1430   HGBUR SMALL (A) 12/03/2018 0356   HGBUR negative 12/23/2008 1131   BILIRUBINUR NEGATIVE 12/03/2018 0356   KETONESUR 5 (A) 12/03/2018 0356   PROTEINUR NEGATIVE 12/03/2018 0356   UROBILINOGEN 0.2 05/19/2013 2319   NITRITE POSITIVE (A) 12/03/2018 0356   LEUKOCYTESUR MODERATE (A) 12/03/2018 0356   Sepsis Labs Invalid input(s): PROCALCITONIN,  WBC,  LACTICIDVEN Microbiology Recent Results (from the past 240 hour(s))  SARS Coronavirus 2 [facility] order, Performed in [place] hospital lab) Nasopharyngeal     Status: None   Collection Time: 12/03/18  3:56 AM   Specimen: Nasopharyngeal  Result Value Ref Range Status   SARS Coronavirus 2 NEGATIVE NEGATIVE Final    Comment: (NOTE) If result is NEGATIVE SARS-CoV-2 target nucleic acids are NOT DETECTED. The SARS-CoV-2 RNA is generally detectable in upper and lower  respiratory specimens during the acute phase of infection. The lowest  concentration of SARS-CoV-2 viral copies this assay can detect is 250  copies / mL. A negative result does not preclude SARS-CoV-2 infection  and should not be used as the sole basis for treatment or other  patient management decisions.  A negative result may occur with  improper specimen collection / handling, submission of specimen other  than nasopharyngeal swab, presence of viral mutation(s) within the  areas targeted by this assay, and inadequate number of viral copies  (<250 copies / mL). A negative result must be combined with clinical  observations, patient history, and epidemiological information. If result is POSITIVE SARS-CoV-2 target nucleic acids are DETECTED. The SARS-CoV-2 RNA is generally detectable in upper and lower  respiratory specimens dur ing the acute phase of infection.  Positive  results are indicative of active infection with SARS-CoV-2.  Clinical  correlation with patient history and  other diagnostic information is  necessary to determine patient infection status.  Positive results do  not rule out bacterial infection or co-infection with other viruses. If result is PRESUMPTIVE POSTIVE SARS-CoV-2 nucleic acids MAY BE PRESENT.   A presumptive positive result was obtained on the submitted specimen  and confirmed on repeat testing.  While 2019 novel coronavirus  (SARS-CoV-2) nucleic acids may be present in the submitted sample  additional confirmatory testing may be necessary for epidemiological  and / or clinical management purposes  to differentiate between  SARS-CoV-2 and other Sarbecovirus currently known to infect humans.  If clinically indicated additional testing with an alternate test  methodology [phone]) is advised. The SARS-CoV-2 RNA is generally  detectable in upper and lower respiratory sp ecimens during the acute  phase of infection. The expected result is Negative. Fact Sheet for Patients:  StrictlyIdeas.no Fact Sheet for Healthcare Providers: BankingDealers.co.za This test is not yet approved or cleared by the [place] FDA and has been authorized for detection and/or diagnosis of SARS-CoV-2 by FDA under an Emergency Use Authorization (EUA).  This EUA will remain in effect (meaning this test can be used) for the  duration of the COVID-19 declaration under Section 564(b)(1) of the Act, 21 U.S.C. section 360bbb-3(b)(1), unless the authorization is terminated or revoked sooner. Performed at [facility], [place] [address], [place], [place] [address]      Time coordinating discharge: 35 minutes  SIGNED:   Cordelia Poche, MD [facility] 12/08/2018, 9:41 AM

## 2018-12-08 NOTE — Discharge Instructions (Signed)
Angie Brown,  You were in the hospital because of liver inflammation. No specific cause was found. Please follow-up with your PCP and GI physician.

## 2018-12-08 NOTE — Plan of Care (Signed)
Pt ready for DC home 

## 2018-12-09 ENCOUNTER — Other Ambulatory Visit: Payer: Self-pay

## 2018-12-09 ENCOUNTER — Telehealth: Payer: Self-pay

## 2018-12-09 DIAGNOSIS — K759 Inflammatory liver disease, unspecified: Secondary | ICD-10-CM

## 2018-12-09 LAB — HEPATITIS B SURFACE ANTIBODY, QUANTITATIVE: Hep B S AB Quant (Post): <3.1 m[IU]/mL — ABNORMAL LOW (ref >9.9)

## 2018-12-09 LAB — ALPHA-1 ANTITRYPSIN PHENOTYPE: A-1 Antitrypsin, Ser: 176 mg/dL (ref 100–188)

## 2018-12-09 LAB — CERULOPLASMIN: Ceruloplasmin: 24.7 mg/dL (ref 19.0–39.0)

## 2018-12-09 LAB — HEPATITIS A ANTIBODY, TOTAL: hep A Total Ab: NEGATIVE

## 2018-12-09 LAB — SURGICAL PATHOLOGY

## 2018-12-09 LAB — HEPATITIS B CORE ANTIBODY, TOTAL: Hep B Core Total Ab: NEGATIVE

## 2018-12-09 NOTE — Telephone Encounter (Signed)
-----   Message from [name], MD sent at 12/07/2018  4:12 PM EDT ----- Regarding: Follow up Angie Brown, This patient was seen during her hospital stay with acute hepatitis NOS.She will need a hepatic function panel and INR to be performed in 2 weeks.Follow-up in 3 to 4 weeks with Amy or Janett Billow or myself.I will follow-up her biopsy results when they return.Thanks.GM

## 2018-12-09 NOTE — Telephone Encounter (Signed)
appt made with Dr Rush Landmark on 01/09/19 at 850 am pt to get labs in 2 weeks. Order in [place].  Information sent to the pt via My Chart

## 2018-12-10 ENCOUNTER — Encounter (HOSPITAL_COMMUNITY): Payer: Self-pay | Admitting: Gastroenterology

## 2018-12-11 ENCOUNTER — Encounter: Payer: Self-pay | Admitting: Gastroenterology

## 2019-01-09 ENCOUNTER — Other Ambulatory Visit (INDEPENDENT_AMBULATORY_CARE_PROVIDER_SITE_OTHER): Payer: Medicaid Other

## 2019-01-09 ENCOUNTER — Encounter: Payer: Self-pay | Admitting: Gastroenterology

## 2019-01-09 ENCOUNTER — Other Ambulatory Visit: Payer: Self-pay

## 2019-01-09 ENCOUNTER — Ambulatory Visit (INDEPENDENT_AMBULATORY_CARE_PROVIDER_SITE_OTHER): Payer: Medicaid Other | Admitting: Gastroenterology

## 2019-01-09 VITALS — BP 120/80 | HR 105 | Temp 98.4°F | Ht 67.25 in | Wt 260.0 lb

## 2019-01-09 DIAGNOSIS — R112 Nausea with vomiting, unspecified: Secondary | ICD-10-CM | POA: Diagnosis not present

## 2019-01-09 DIAGNOSIS — R198 Other specified symptoms and signs involving the digestive system and abdomen: Secondary | ICD-10-CM | POA: Diagnosis not present

## 2019-01-09 DIAGNOSIS — K2990 Gastroduodenitis, unspecified, without bleeding: Secondary | ICD-10-CM

## 2019-01-09 DIAGNOSIS — K2970 Gastritis, unspecified, without bleeding: Secondary | ICD-10-CM | POA: Diagnosis not present

## 2019-01-09 DIAGNOSIS — K760 Fatty (change of) liver, not elsewhere classified: Secondary | ICD-10-CM

## 2019-01-09 DIAGNOSIS — R945 Abnormal results of liver function studies: Secondary | ICD-10-CM

## 2019-01-09 DIAGNOSIS — R7989 Other specified abnormal findings of blood chemistry: Secondary | ICD-10-CM

## 2019-01-09 DIAGNOSIS — R1011 Right upper quadrant pain: Secondary | ICD-10-CM

## 2019-01-09 LAB — COMPREHENSIVE METABOLIC PANEL
ALT: 47 U/L — ABNORMAL HIGH (ref 0–35)
AST: 78 U/L — ABNORMAL HIGH (ref 0–37)
Albumin: 4.1 g/dL (ref 3.5–5.2)
Alkaline Phosphatase: 92 U/L (ref 39–117)
BUN: 7 mg/dL (ref 6–23)
CO2: 29 mEq/L (ref 19–32)
Calcium: 9.2 mg/dL (ref 8.4–10.5)
Chloride: 100 mEq/L (ref 96–112)
Creatinine, Ser: 0.73 mg/dL (ref 0.40–1.20)
GFR: 88.49 mL/min (ref >60.00)
Glucose, Bld: 110 mg/dL — ABNORMAL HIGH (ref 70–99)
Potassium: 3.8 mEq/L (ref 3.5–5.1)
Sodium: 137 mEq/L (ref 135–145)
Total Bilirubin: 0.4 mg/dL (ref 0.2–1.2)
Total Protein: 7.4 g/dL (ref 6.0–8.3)

## 2019-01-09 LAB — LIPASE: Lipase: 30 U/L (ref 11.0–59.0)

## 2019-01-09 LAB — AMYLASE: Amylase: 15 U/L — ABNORMAL LOW (ref 27–131)

## 2019-01-09 LAB — PROTIME-INR
INR: 1 ratio (ref 0.8–1.0)
Prothrombin Time: 11.9 s (ref 9.6–13.1)

## 2019-01-09 LAB — CORTISOL: Cortisol, Plasma: 7.2 ug/dL

## 2019-01-09 MED ORDER — SCOPOLAMINE 1 MG/3DAYS TD PT72: 1.0000 | MEDICATED_PATCH | TRANSDERMAL | 1 refills | Status: DC

## 2019-01-09 MED ORDER — OMEPRAZOLE 40 MG PO CPDR: 40.0000 mg | DELAYED_RELEASE_CAPSULE | Freq: Two times a day (BID) | ORAL | 2 refills | Status: DC

## 2019-01-09 NOTE — Progress Notes (Signed)
[place] VISIT   Primary Care Provider Karleen Hampshire., MD [address] [place] [address] [phone]  Patient Profile: Angie Brown is a 38 y.o. adult with a pmh significant for asthma/bronchitis, depression, arrhythmia (PVCs), hypothyroidism, status post cholecystectomy, likely fatty liver disease.  The patient presents to the [facility] for an evaluation and management of problem(s) noted below:  Problem List 1. Abnormal LFTs   2. Abdominal pain, right upper quadrant   3. Non-intractable vomiting with nausea, unspecified vomiting type   4. Fatty liver   5. Gastritis and gastroduodenitis   6. Change in bowel function     History of Present Illness This is a patient that I met in the hospital in September in the setting of abdominal pain and abnormal liver biochemical testing.  The patient underwent an extensive work-up while she was hospitalized including a CT abdomen/pelvis as well as an MRI/MRCP.  Liver tests were trending down towards the end of her hospitalization and she was never jaundiced.  An EGD was performed with results as below.  The patient was to return in 2 weeks for follow-up of her liver tests.  The patient had not followed up.  Today, the patient follows up and has been doing better until the last few days.  After she left the hospital she was seen by her primary care doctor and she had thyroid medications initiated.  She is still in the process of getting used to her medications.  She was only taking PPI once in the evening and she forgets at times.  She has had to take Phenergan on a daily basis due to nausea persisting.  Within the last 24 to 48 hours she had to the point where she was having episodes of feeling like she needed to collapse and she was having recurrent discomfort and nausea.  She almost went to the ED but did not take.  She was initiated on pravastatin for short period of time but this was  stopped until it was felt that her thyroid medication was fully adjusted.  Patient states that her stools are clay colored at this time.  She denies darkened urine.  She denies any pruritus.  GI Review of Systems Positive as above including early satiety, bloating, abdominal distention Negative for pyrosis, dysphagia, odynophagia, melena, hematochezia  Review of Systems General: Denies fevers/chills/weight loss HEENT: Denies oral lesions Cardiovascular: Denies chest pain Pulmonary: Denies shortness of breath Gastroenterological: See HPI Genitourinary: Denies darkened urine Hematological: Denies easy bruising/bleeding Dermatological: Denies jaundice Psychological: Mood is anxious about getting better   Medications Current Outpatient Medications  Medication Sig Dispense Refill  . albuterol (VENTOLIN HFA) 108 (90 Base) MCG/ACT inhaler Inhale 1-2 puffs into the lungs every 6 (six) hours as needed for wheezing or shortness of breath.    . carvedilol (COREG) 6.25 MG tablet Take 6.25 mg by mouth every evening.    . clonazePAM (KLONOPIN) 1 MG tablet Take 1 mg by mouth at bedtime.     . cyclobenzaprine (FLEXERIL) 10 MG tablet Take 10 mg by mouth at bedtime.     . diphenhydrAMINE (BENADRYL) 25 mg capsule Take 25 mg by mouth at bedtime.    Marland Kitchen EPIPEN 2-PAK 0.3 MG/0.3ML SOAJ injection Inject 0.3 mLs (0.3 mg total) into the muscle as needed (for allergic reaction). 2 each 0  . escitalopram (LEXAPRO) 10 MG tablet Take 10 mg by mouth at bedtime.    . fluticasone (FLONASE) 50 MCG/ACT  nasal spray Place 1 spray into both nostrils at bedtime.    . gabapentin (NEURONTIN) 300 MG capsule Take 900 mg by mouth at bedtime.    Marland Kitchen levothyroxine (SYNTHROID) 50 MCG tablet Take 50 mcg by mouth daily before breakfast.    . montelukast (SINGULAIR) 10 MG tablet Take 10 mg by mouth at bedtime.    Marland Kitchen omeprazole (PRILOSEC) 40 MG capsule Take 1 capsule (40 mg total) by mouth 2 (two) times daily at 8 am and 10 pm. 60 capsule  2  . promethazine (PHENERGAN) 25 MG tablet Take 1 tablet (25 mg total) by mouth every 6 (six) hours as needed for nausea or vomiting. 10 tablet 0  . rizatriptan (MAXALT-MLT) 5 MG disintegrating tablet Take 5 mg by mouth daily as needed for migraine.     . Fructose-Dextrose-Phosphor Acd (NAUSEA CONTROL PO) Take 1 patch by mouth every 3 (three) days.    Marland Kitchen scopolamine (TRANSDERM-SCOP, 1.5 MG,) 1 MG/3DAYS Place 1 patch (1.5 mg total) onto the skin every 3 (three) days. 6 patch 1   No current facility-administered medications for this visit.     Allergies Allergies  Allergen Reactions  . Loratadine Shortness Of Breath, Swelling and Palpitations    hypoventilation   . Wasp Venom Anaphylaxis  . Sumatriptan Other (See Comments)    sinus pain, sinus on fire and face hurts  . Doxycycline Nausea And Vomiting  . Ibuprofen Nausea And Vomiting    GI upset  . Methocarbamol Rash    Histories Past Medical History:  Diagnosis Date  . Asthma   . Bronchitis   . Depression   . Dysrhythmia    PVCs- stress related  . Hypoglycemia   . Hypothyroidism   . PONV (postoperative nausea and vomiting)    pt prefers scop patch   Past Surgical History:  Procedure Laterality Date  . ABDOMINAL HYSTERECTOMY    . BACK SURGERY  07/2009   spinal fusion L 5-S1  . BIOPSY  12/07/2018   Procedure: BIOPSY;  Surgeon: Rush Landmark Telford Nab., MD;  Location: [facility];  Service: Gastroenterology;;  . BLADDER SUSPENSION N/A 03/11/2014   Procedure: TRANSVAGINAL TAPE (TVT) PROCEDURE;  Surgeon: Cheri Fowler, MD;  Location: [facility];  Service: Gynecology;  Laterality: N/A;  . CHOLECYSTECTOMY    . ESOPHAGOGASTRODUODENOSCOPY (EGD) WITH PROPOFOL N/A 12/07/2018   Procedure: ESOPHAGOGASTRODUODENOSCOPY (EGD) WITH PROPOFOL;  Surgeon: Rush Landmark Telford Nab., MD;  Location: [facility];  Service: Gastroenterology;  Laterality: N/A;  . TONSILLECTOMY     Social History   Socioeconomic History  . Marital status: Divorced     Spouse name: Not on file  . Number of children: 3  . Years of education: Not on file  . Highest education level: Not on file  Occupational History  . Occupation: housewife  Social Needs  . Financial resource strain: Not on file  . Food insecurity    Worry: Not on file    Inability: Not on file  . Transportation needs    Medical: Not on file    Non-medical: Not on file  Tobacco Use  . Smoking status: Former Smoker    Packs/day: 0.50    Types: Cigarettes  . Smokeless tobacco: Never Used  Substance and Sexual Activity  . Alcohol use: Not Currently  . Drug use: No  . Sexual activity: Yes    Partners: Male    Birth control/protection: Surgical  Lifestyle  . Physical activity    Days per week: Not on file  Minutes per session: Not on file  . Stress: Not on file  Relationships  . Social Herbalist on phone: Not on file    Gets together: Not on file    Attends religious service: Not on file    Active member of club or organization: Not on file    Attends meetings of clubs or organizations: Not on file    Relationship status: Not on file  . Intimate partner violence    Fear of current or ex partner: Not on file    Emotionally abused: Not on file    Physically abused: Not on file    Forced sexual activity: Not on file  Other Topics Concern  . Not on file  Social History Narrative  . Not on file   Family History  Problem Relation Age of Onset  . Liver disease Mother        Fatty liver, NASH  . GI Bleed Mother   . Hypothyroidism Mother   . Hypothyroidism Sister   . Liver disease Sister        fatty liver  . Liver cancer Maternal Grandfather   . Hypothyroidism Sister   . Colon cancer Neg Hx   . Esophageal cancer Neg Hx   . Rectal cancer Neg Hx   . Inflammatory bowel disease Neg Hx   . Pancreatic cancer Neg Hx   . Stomach cancer Neg Hx    I have reviewed his medical, social, and family history in detail and updated the electronic medical record as  necessary.    PHYSICAL EXAMINATION  BP 120/80   Pulse (!) 105   Temp 98.4 F (36.9 C)   Ht 5' 7.25" (1.708 m)   Wt 260 lb (117.9 kg)   BMI 40.42 kg/m  Wt Readings from Last 3 Encounters:  01/10/19 260 lb (117.9 kg)  01/09/19 260 lb (117.9 kg)  12/07/18 260 lb 2.1 oz (118 kg)  GEN: NAD, appears stated age, doesn't appear chronically ill PSYCH: Cooperative, without pressured speech EYE: Conjunctivae pink, sclerae anicteric ENT: MMM, without oral ulcers NECK: Supple, enlarged neck girth CV: RR without R/Gs  RESP: CTAB posteriorly, without wheezing GI: NABS, soft, rounded, obese, mild tenderness to palpation deeply in the right upper quadrant/midepigastrium, no volitional guarding, hepatomegaly appreciated once again MSK/EXT: Trace bilateral lower extremity edema (1/2+) SKIN: No jaundice, no spider angiomata, no concerning rashes NEURO:  Alert & Oriented x 3, no focal deficits, no evidence of asterixis   REVIEW OF DATA  I reviewed the following data at the time of this encounter:  GI Procedures and Studies  September 2020 EGD - No gross lesions in proximal esophagus. Salmon-colored mucosa island at distal esophagus. Biopsied to rule out Barrett's. Z-line regular, 40 cm from the incisors. - No gross lesions in the stomach. Biopsied for HP. - No gross lesions in the duodenal bulb, in the first portion of the duodenum and in the second portion of the duodenum. Biopsied for enteropathy. DIAGNOSIS:  A. DUODENUM, BIOPSY:  - Duodenal mucosa slight Brunner gland hyperplasia.  - No features of sprue, active inflammation or granulomas.  B. STOMACH, BIOPSY:  - Antral and oxyntic mucosa with slight chronic inflammation.  - Warthin-Starry negative for Helicobacter pylori.  - No intestinal metaplasia, dysplasia or malignancy.  C. ESOPHAGUS, DISTAL, BIOPSY:  - Gastroesophageal mucosa with inflammation consistent with reflux.  - No intestinal metaplasia, dysplasia or malignancy.   Laboratory Studies  Reviewed those in epic   03/05/2015 17:49  11/01/2015 16:09 12/02/2018 23:50 12/03/2018 05:23 12/04/2018 02:22 12/05/2018 02:29 12/07/2018 02:22  AST 46 (H) 32 797 (H) 584 (H) 265 (H) 131 (H) 53 (H)  ALT 51 24 758 (H) 640 (H) 456 (H) 293 (H) 142 (H)  Alkaline Phosphatase 80 91 86 85 81 73 75  Total Bilirubin 0.8 0.8 0.8 0.9 0.4 0.5 0.7  Bilirubin, Direct      0.2   Indirect Bilirubin      0.3      12/03/2018 03:56 12/08/2018 03:05  Hep A Ab, IgM Negative   Hep A Ab, Total  Negative  Hepatitis B Surface Ag Negative   Hep B Core Ab, IgM Negative   Hepatitis B-Post  <3.1 (L)  Hep B Core Total Ab  Negative  HCV Ab <0.1     02/19/2009 19:02 12/03/2018 03:56 12/06/2018 02:30  A-1 Antitrypsin, Ser   176  ANA Ab, IFA  Negative   LKM1 Ab  1.1   Mitochondrial M2 Ab, IgG  <20.0   Tissue Transglutaminase Ab, IgA 0.1 U/ML      12/06/2018 02:30  A-1 Antitrypsin Pheno MM    12/03/2018 03:56  F-Actin IgG 6    Imaging Studies  September 2020 MRI/MRCP IMPRESSION: 1. Hepatic steatosis and hepatomegaly. 2. No other explanation for pain or elevated liver function tests.  CT abdomen pelvis with contrast IMPRESSION: 1. No evidence of acute intra-abdominal process. Normal appendix. 2. Hepatic steatosis. 3. Post cholecystectomy and hysterectomy.   ASSESSMENT  Mr. Wilborn is a 38 y.o. adult with a pmh significant for asthma/bronchitis, depression, arrhythmia (PVCs), hypothyroidism, status post cholecystectomy, likely fatty liver disease.  The patient is seen today for evaluation and management of:  1. Abnormal LFTs   2. Abdominal pain, right upper quadrant   3. Non-intractable vomiting with nausea, unspecified vomiting type   4. Fatty liver   5. Gastritis and gastroduodenitis   6. Change in bowel function    The patient is hemodynamically stable.  From a clinical perspective she is improved from her hospitalization but she continues to have ongoing symptoms.  She is concerned and  describes colicky stools but she shows no evidence on physical exam to suggest that she has a elevation of her bilirubin she never did during her hospitalization.  We need to recheck labs to better understand her status however.  If she shows elevation of true bilirubin elevation and progressive liver test abnormalities even though nothing has shown up on any of her serological work-up we may need to consider additional work-up and potential liver biopsy.  I suspect that she does have some underlying nonalcoholic fatty liver disease however with that being said we do not usually see.  In time the patient is getting acute hepatitis and had improvement clinically.  Her initial presentation had been concerning for the possibility of biliary colic even though she is postcholecystectomy.  MRI imaging has not suggested any evidence of biliary ductal disorder/disease.  If she continues to have issues we may query the role of an endoscopic ultrasound.  She needs to be on a higher dose PPI at this point in time and we are going to increase her back up to twice daily to see how that does in regards to helping some of her symptoms.  She will continue Phenergan as needed.  As she has issues with constipation normally and Zofran constipated her further she will need to maintain the Phenergan for now.  We are going to try scopolamine patches on an  as-needed basis for her to see if that will help with her nausea as it did help with postoperative nausea and vomiting and she has had success with this in the past.  She does understand the anticholinergic effects of scopolamine patch and because she has had issues with urinary tract infection she may be at risk of developing urinary retention and dry mouth.  She will be on looking for the symptoms if she is using the scopolamine patch.  We will see her back in follow-up based on her laboratories.  We rule out a few other things in regards to her nausea and vomiting for potential  etiologies such as adrenal insufficiency.  I wonder how much of her hypothyroidism may be playing a role with some of her liver test abnormalities.  Will be interesting to see what her thyroid function looks like as she was for and see how her liver tests follow through.  If she continues to have liver test abnormalities over the course of 6 months to a year we will consider the role of liver biopsy.  She may need some additional serologies but for now we have done a pretty extensive work-up currently to see how her liver tests look now.  Even though the MRI/MRCP is very sensitive for biliary duct issues she never had a right upper quadrant ultrasound and will plan to proceed with that since she still having discomfort in that area.  All patient questions were answered, to the best of my ability, and the patient agrees to the aforementioned plan of action with follow-up as indicated.   PLAN  Laboratories as outlined below Fecal elastase to be sent Query small intestine bacterial overgrowth as a possible etiology based on her fecal elastase if it is negative Liver biopsy to be considered if after 6 to 12 months she has persistent elevation liver test abnormalities Abdominal ultrasound to be ordered May consider an endoscopic ultrasound to ensure no microlithiasis is noted if symptoms persist Phenergan as needed Scopolamine patch as needed to be trialed Weight loss important and key in nonalcoholic fatty liver disease as well as good management of hypothyroidism   Orders Placed This Encounter  Procedures  . US Abdomen Limited RUQ  . Comp Met (CMET)  . Amylase  . Lipase  . Cortisol  . INR/PT  . Pancreatic Elastase, Fecal    New Prescriptions   SCOPOLAMINE (TRANSDERM-SCOP, 1.5 MG,) 1 MG/3DAYS    Place 1 patch (1.5 mg total) onto the skin every 3 (three) days.   Modified Medications   Modified Medication Previous Medication   OMEPRAZOLE (PRILOSEC) 40 MG CAPSULE omeprazole (PRILOSEC) 40 MG  capsule      Take 1 capsule (40 mg total) by mouth 2 (two) times daily at 8 am and 10 pm.    Take 40 mg by mouth at bedtime.    Planned Follow Up No follow-ups on file.   Justice Britain, MD [facility] Office # [phone]

## 2019-01-09 NOTE — Patient Instructions (Signed)
We have sent the following medications to your pharmacy for you to pick up at your convenience: Omeprazole and Scopolamine patches  Your provider has requested that you go to the basement level for lab work before leaving today. Press "B" on the elevator. The lab is located at the first door on the left as you exit the elevator.  You have been scheduled for an abdominal ultrasound at [facility] (1st floor of hospital) on 01/15/19 at 9:00am. Please arrive 15 minutes prior to your appointment for registration. Make certain not to have anything to eat or drink 6 hours prior to your appointment. Should you need to reschedule your appointment, please contact radiology at [phone]. This test typically takes about 30 minutes to perform.  Increase your Omeprazole to twice daily for the next 4 weeks.  New rx has been sent to your pharmacy.   Scopolamine patches has also been sent to your pharmacy- Keep patch on for 72hours as directed.   If you are age 74 or younger, your body mass index should be between 19-25. Your Body mass index is 40.42 kg/m. If this is out of the aformentioned range listed, please consider follow up with your Primary Care Provider.   Thank you for choosing me and [facility].  Dr. Rush Landmark

## 2019-01-10 ENCOUNTER — Other Ambulatory Visit: Payer: Self-pay

## 2019-01-10 ENCOUNTER — Encounter (HOSPITAL_COMMUNITY): Payer: Self-pay

## 2019-01-10 ENCOUNTER — Emergency Department (HOSPITAL_COMMUNITY)
Admission: EM | Admit: 2019-01-10 | Discharge: 2019-01-10 | Disposition: A | Discharge status: Home / Self Care | Payer: Medicaid Other | Attending: Emergency Medicine | Admitting: Emergency Medicine

## 2019-01-10 DIAGNOSIS — Z87891 Personal history of nicotine dependence: Secondary | ICD-10-CM | POA: Diagnosis not present

## 2019-01-10 DIAGNOSIS — J45909 Unspecified asthma, uncomplicated: Secondary | ICD-10-CM | POA: Diagnosis not present

## 2019-01-10 DIAGNOSIS — Z79899 Other long term (current) drug therapy: Secondary | ICD-10-CM | POA: Diagnosis not present

## 2019-01-10 DIAGNOSIS — R55 Syncope and collapse: Secondary | ICD-10-CM | POA: Diagnosis present

## 2019-01-10 DIAGNOSIS — E039 Hypothyroidism, unspecified: Secondary | ICD-10-CM | POA: Insufficient documentation

## 2019-01-10 LAB — BASIC METABOLIC PANEL
Anion gap: 8 (ref 5–15)
BUN: 8 mg/dL (ref 6–20)
CO2: 23 mmol/L (ref 22–32)
Calcium: 8.4 mg/dL — ABNORMAL LOW (ref 8.9–10.3)
Chloride: 105 mmol/L (ref 98–111)
Creatinine, Ser: 0.64 mg/dL (ref 0.44–1.00)
GFR calc Af Amer: >60 mL/min (ref >60)
GFR calc non Af Amer: >60 mL/min (ref >60)
Glucose, Bld: 102 mg/dL — ABNORMAL HIGH (ref 70–99)
Potassium: 4.5 mmol/L (ref 3.5–5.1)
Sodium: 136 mmol/L (ref 135–145)

## 2019-01-10 LAB — CBC WITH DIFFERENTIAL/PLATELET
Abs Immature Granulocytes: 0.03 10*3/uL (ref 0.00–0.07)
Basophils Absolute: 0.1 10*3/uL (ref 0.0–0.1)
Basophils Relative: 1 %
Eosinophils Absolute: 0.5 10*3/uL (ref 0.0–0.5)
Eosinophils Relative: 7 %
HCT: 41.2 % (ref 36.0–46.0)
Hemoglobin: 13.8 g/dL (ref 12.0–15.0)
Immature Granulocytes: 0 %
Lymphocytes Relative: 17 %
Lymphs Abs: 1.2 10*3/uL (ref 0.7–4.0)
MCH: 32.5 pg (ref 26.0–34.0)
MCHC: 33.5 g/dL (ref 30.0–36.0)
MCV: 97.2 fL (ref 80.0–100.0)
Monocytes Absolute: 0.4 10*3/uL (ref 0.1–1.0)
Monocytes Relative: 5 %
Neutro Abs: 5 10*3/uL (ref 1.7–7.7)
Neutrophils Relative %: 70 %
Platelets: 232 10*3/uL (ref 150–400)
RBC: 4.24 MIL/uL (ref 3.87–5.11)
RDW: 12.2 % (ref 11.5–15.5)
WBC: 7.1 10*3/uL (ref 4.0–10.5)
nRBC: 0 % (ref 0.0–0.2)

## 2019-01-10 LAB — I-STAT BETA HCG BLOOD, ED (MC, WL, AP ONLY): I-stat hCG, quantitative: <5 m[IU]/mL (ref <5)

## 2019-01-10 LAB — CBG MONITORING, ED: Glucose-Capillary: 107 mg/dL — ABNORMAL HIGH (ref 70–99)

## 2019-01-10 MED ORDER — PROMETHAZINE HCL 25 MG/ML IJ SOLN
25.0000 mg | Freq: Once | INTRAMUSCULAR | Status: AC
  Administered 2019-01-10: 17:00:00 25 mg via INTRAVENOUS
  Filled 2019-01-10: qty 1

## 2019-01-10 MED ORDER — SODIUM CHLORIDE 0.9 % IV BOLUS
1000.0000 mL | Freq: Once | INTRAVENOUS | Status: AC
  Administered 2019-01-10: 1000 mL via INTRAVENOUS

## 2019-01-10 NOTE — ED Provider Notes (Signed)
[facility]-EMERGENCY DEPT Provider Note   CSN: [id number] Arrival date & time: 01/10/19  1421     History   Chief Complaint Chief Complaint  Patient presents with  . Loss of Consciousness    HPI Angie Brown is a 38 y.o. adult.     The history is provided by the patient and medical records. No language interpreter was used.  Loss of Consciousness Associated symptoms: nausea   Associated symptoms: no vomiting    Angie Brown is a 38 y.o. adult  with a PMH as listed below who presents to the Emergency Department complaining of syncopal episode just prior to arrival. Reports history of similar in the past. Patient states that she felt flushed and lightheaded.  She called her daughter into the room and tells me that her daughter lowered her down into the bed.  She did not fall or hit her head.  Denies any arthralgias or myalgias.  Denies any chest pain or shortness of breath.  Has had chronic abdominal pain secondary to her liver that is being followed closely by GI.  Denies headache or visual changes.  Reports taking one of her Klonopin this morning, but denies any other medications.   Past Medical History:  Diagnosis Date  . Asthma   . Bronchitis   . Depression   . Dysrhythmia    PVCs- stress related  . Hypoglycemia   . Hypothyroidism   . PONV (postoperative nausea and vomiting)    pt prefers scop patch    Patient Active Problem List   Diagnosis Date Noted  . Non-intractable vomiting   . NASH (nonalcoholic steatohepatitis)   . Hepatitis 12/03/2018  . Transaminitis 12/03/2018  . 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 Abdominal pain 06/08/2017  . Chronic constipation 02/14/2017  . Asthma 01/26/2017  . Anaphylaxis 09/29/2016  . Elevated glucose 09/29/2016  . Chronic pain 06/29/2016  . 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 Voiding dysfunction 03/08/2016  . Other insomnia 03/01/2016  . Elevated blood pressure reading  01/05/2016  . GAD (generalized anxiety disorder) 01/05/2016  . Gastroesophageal reflux disease without esophagitis 01/05/2016  . Recurrent major depressive disorder (HCC) 01/05/2016  . Stress incontinence in female 03/11/2014  . HYPOTENSION 03/18/2010  . ARM PAIN 04/28/2009  . ABNORMAL INVOLUNTARY MOVEMENTS 04/28/2009  . FACIAL PARESTHESIA 04/28/2009  . HEADACHE 04/26/2009  . WHEEZING 03/23/2009  . DIARRHEA, ACUTE, CHRONIC 02/19/2009  . HYDRONEPHROSIS, RIGHT 02/18/2009  . PYELONEPHRITIS, HX OF 02/18/2009  . MIGRAINES, HX OF 02/18/2009  . LUMBAR RADICULOPATHY, RIGHT 12/23/2008  . ASTHMATIC BRONCHITIS, ACUTE 01/22/2008  . BACK PAIN 01/22/2008  . ELEVATED BLOOD PRESSURE WITHOUT DIAGNOSIS OF HYPERTENSION 01/22/2008  . OBESITY, MORBID 05/29/2007  . INSOMNIA-SLEEP DISORDER-UNSPEC 05/29/2007  . HYPERSOMNIA 05/29/2007  . ANEMIA-NOS 01/01/2007  . ANXIETY 01/01/2007  . DEPRESSION 01/01/2007  . COMMON MIGRAINE 01/01/2007  . ASTHMA 01/01/2007  . SYNCOPE 01/01/2007    Past Surgical History:  Procedure Laterality Date  . ABDOMINAL HYSTERECTOMY    . BACK SURGERY  07/2009   spinal fusion L 5-S1  . BIOPSY  12/07/2018   Procedure: BIOPSY;  Surgeon: Meridee ScoreMansouraty, Netty StarringGabriel Jr., MD;  Location: [facility];  Service: Gastroenterology;;  . BLADDER SUSPENSION N/A 03/11/2014   Procedure: TRANSVAGINAL TAPE (TVT) PROCEDURE;  Surgeon: Lavina Hammanodd Meisinger, MD;  Location: [facility];  Service: Gynecology;  Laterality: N/A;  . CHOLECYSTECTOMY    . ESOPHAGOGASTRODUODENOSCOPY (EGD) WITH PROPOFOL N/A 12/07/2018   Procedure: ESOPHAGOGASTRODUODENOSCOPY (EGD) WITH PROPOFOL;  Surgeon:  Mansouraty, Telford Nab., MD;  Location: [facility];  Service: Gastroenterology;  Laterality: N/A;  . TONSILLECTOMY       OB History   No obstetric history on file.      Home Medications    Prior to Admission medications   Medication Sig Start Date End Date Taking? Authorizing Provider  albuterol (VENTOLIN HFA) 108 (90 Base)  MCG/ACT inhaler Inhale 1-2 puffs into the lungs every 6 (six) hours as needed for wheezing or shortness of breath.   Yes [provider]  carvedilol (COREG) 6.25 MG tablet Take 6.25 mg by mouth every evening.   Yes [provider]  clonazePAM (KLONOPIN) 1 MG tablet Take 1 mg by mouth at bedtime.  06/18/18  Yes [provider]  cyclobenzaprine (FLEXERIL) 10 MG tablet Take 10 mg by mouth at bedtime.    Yes [provider]  diphenhydrAMINE (BENADRYL) 25 mg capsule Take 25 mg by mouth at bedtime.   Yes [provider]  EPIPEN 2-PAK 0.3 MG/0.3ML SOAJ injection Inject 0.3 mLs (0.3 mg total) into the muscle as needed (for allergic reaction). 10/13/18  Yes Lavina Hamman, MD  escitalopram (LEXAPRO) 10 MG tablet Take 10 mg by mouth at bedtime.   Yes [provider]  fluticasone (FLONASE) 50 MCG/ACT nasal spray Place 1 spray into both nostrils at bedtime.   Yes [provider]  Fructose-Dextrose-Phosphor Acd (NAUSEA CONTROL PO) Take 1 patch by mouth every 3 (three) days.   Yes [provider]  gabapentin (NEURONTIN) 300 MG capsule Take 900 mg by mouth at bedtime.   Yes [provider]  levothyroxine (SYNTHROID) 50 MCG tablet Take 50 mcg by mouth daily before breakfast.   Yes [provider]  montelukast (SINGULAIR) 10 MG tablet Take 10 mg by mouth at bedtime.   Yes [provider]  omeprazole (PRILOSEC) 40 MG capsule Take 1 capsule (40 mg total) by mouth 2 (two) times daily at 8 am and 10 pm. 01/09/19  Yes Mansouraty, Telford Nab., MD  promethazine (PHENERGAN) 25 MG tablet Take 1 tablet (25 mg total) by mouth every 6 (six) hours as needed for nausea or vomiting. 12/08/18  Yes Mariel Aloe, MD  rizatriptan (MAXALT-MLT) 5 MG disintegrating tablet Take 5 mg by mouth daily as needed for migraine.  09/25/18  Yes [provider]  scopolamine (TRANSDERM-SCOP, 1.5 MG,) 1 MG/3DAYS Place 1 patch (1.5 mg total) onto  the skin every 3 (three) days. 01/09/19   Mansouraty, Telford Nab., MD    Family History Family History  Problem Relation Age of Onset  . Liver disease Mother        Fatty liver, NASH  . GI Bleed Mother   . Hypothyroidism Mother   . Hypothyroidism Sister   . Liver disease Sister        fatty liver  . Liver cancer Maternal Grandfather   . Hypothyroidism Sister   . Colon cancer Neg Hx   . Esophageal cancer Neg Hx   . Rectal cancer Neg Hx     Social History Social History   Tobacco Use  . Smoking status: Former Smoker    Packs/day: 0.50    Types: Cigarettes  . Smokeless tobacco: Never Used  Substance Use Topics  . Alcohol use: Not Currently  . Drug use: No     Allergies   Loratadine, Wasp venom, Sumatriptan, Doxycycline, Ibuprofen, and Methocarbamol   Review of Systems Review of Systems  Cardiovascular: Positive for syncope.  Gastrointestinal: Positive  for abdominal pain and nausea. Negative for constipation, diarrhea and vomiting.  Neurological: Positive for syncope.  All other systems reviewed and are negative.    Physical Exam Updated Vital Signs BP (!) 128/93   Pulse 90   Temp 98.2 F (36.8 C)   Resp 18   Ht  (1.702 m)   Wt 117.9 kg   SpO2 96%   BMI 40.72 kg/m   Physical Exam Vitals signs and nursing note reviewed.  Constitutional:      General: He is not in acute distress.    Appearance: He is well-developed.  HENT:     Head: Normocephalic and atraumatic.  Neck:     Musculoskeletal: Neck supple.  Cardiovascular:     Rate and Rhythm: Normal rate and regular rhythm.     Heart sounds: Normal heart sounds. No murmur.  Pulmonary:     Effort: Pulmonary effort is normal. No respiratory distress.     Breath sounds: Normal breath sounds.  Musculoskeletal:        General: No swelling or tenderness.     Right lower leg: No edema.     Left lower leg: No edema.  Skin:    General: Skin is warm and dry.  Neurological:     Mental Status: He is  alert and oriented to person, place, and time.     Comments: Alert, oriented, thought content appropriate. Able to give a coherent history. Speech is clear and goal oriented, able to follow commands.  Cranial Nerves:  II:  Peripheral visual fields grossly normal, pupils equal, round, reactive to light III, IV, VI: EOM intact bilaterally, ptosis not present V,VII: smile symmetric, eyes kept closed tightly against resistance, facial light touch sensation equal VIII: hearing grossly normal IX, X: symmetric soft palate movement, uvula elevates symmetrically  XI: bilateral shoulder shrug symmetric and strong XII: midline tongue extension 5/5 muscle strength in upper and lower extremities bilaterally including strong and equal grip strength and dorsiflexion/plantar flexion Sensory to light touch normal in all four extremities.      ED Treatments / Results  Labs (all labs ordered are listed, but only abnormal results are displayed) Labs Reviewed  BASIC METABOLIC PANEL - Abnormal; Notable for the following components:      Result Value   Glucose, Bld 102 (*)    Calcium 8.4 (*)    All other components within normal limits  CBG MONITORING, ED - Abnormal; Notable for the following components:   Glucose-Capillary 107 (*)    All other components within normal limits  CBC WITH DIFFERENTIAL/PLATELET  I-STAT BETA HCG BLOOD, ED ([facility], [facility], [facility] ONLY)    EKG EKG Interpretation  Date/Time:  Friday January 10 2019 14:45:52 EDT Ventricular Rate:  96 PR Interval:    QRS Duration: 83 QT Interval:  348 QTC Calculation: 440 R Axis:   49 Text Interpretation:  Sinus rhythm unremarkable ecg Confirmed by Gerhard Munch [id number]) on 01/10/2019 5:25:31 PM   Radiology No results found.  Procedures Procedures (including critical care time)  Medications Ordered in ED Medications  sodium chloride 0.9 % bolus 1,000 mL (0 mLs Intravenous Stopped 01/10/19 1703)  promethazine (PHENERGAN) injection 25 mg (25  mg Intravenous Given 01/10/19 1700)     Initial Impression / Assessment and Plan / ED Course  I have reviewed the triage vital signs and the nursing notes.  Pertinent labs & imaging results that were available during my care of the patient were reviewed by me and considered in my  medical decision making (see chart for details).     Angie Brown is a 38 y.o. adult who presents to ED for evaluation of syncopal episode just prior to arrival. On exam, patient is afebrile, hemodynamically stable with no focal neuro deficits and benign cardiopulmonary exam. EKG NSR. No WPW or arrhythmia. Labs reviewed and reassuring. Patient with no personal or family history of sudden, young cardiac death. No hx of CHF. No complaints of chest pain or shortness of breath. Low risk per [facility] Syncope Rule.   Evaluation does not show pathology that would require ongoing emergent intervention or inpatient treatment. Will discharge to home with close follow up. Reasons to return to ER were discussed.   Pt has remained hemodynamically stable throughout their time in the ED.   BP (!) 128/93   Pulse 90   Temp 98.2 F (36.8 C)   Resp 18   Ht 5\' 7"  (1.702 m)   Wt 117.9 kg   SpO2 96%   BMI 40.72 kg/m   Patient understands return precautions and follow up care. All questions answered.   Patient discussed with Dr. who agrees with treatment plan.    Final Clinical Impressions(s) / ED Diagnoses   Final diagnoses:  Syncope, unspecified syncope type    ED Discharge Orders    None       , Jeraldine Loots, PA-C 01/10/19 1732    01/12/19, MD 01/11/19 1321

## 2019-01-10 NOTE — ED Notes (Addendum)
Pt verbalizes understanding of DC instructions. Pt belongings returned and is wheelchair out of the out of ED.

## 2019-01-10 NOTE — Discharge Instructions (Signed)
It was my pleasure taking care of you today!   Follow up with your doctors.   Return to ER for new or worsening symptoms, any additional concerns.

## 2019-01-10 NOTE — ED Triage Notes (Signed)
Per EMS patient had syncopal episode today after bending over to pick object up. Patient had LOC for 67minutes. Hx of thyroid issues, recent change in medications. Patient did not hit head. Patient reports pain in back.

## 2019-01-12 ENCOUNTER — Encounter: Payer: Self-pay | Admitting: Gastroenterology

## 2019-01-12 DIAGNOSIS — R1011 Right upper quadrant pain: Secondary | ICD-10-CM | POA: Insufficient documentation

## 2019-01-12 DIAGNOSIS — K2990 Gastroduodenitis, unspecified, without bleeding: Secondary | ICD-10-CM | POA: Insufficient documentation

## 2019-01-12 DIAGNOSIS — R7989 Other specified abnormal findings of blood chemistry: Secondary | ICD-10-CM | POA: Insufficient documentation

## 2019-01-12 DIAGNOSIS — K760 Fatty (change of) liver, not elsewhere classified: Secondary | ICD-10-CM | POA: Insufficient documentation

## 2019-01-12 DIAGNOSIS — K2970 Gastritis, unspecified, without bleeding: Secondary | ICD-10-CM | POA: Insufficient documentation

## 2019-01-12 DIAGNOSIS — R945 Abnormal results of liver function studies: Secondary | ICD-10-CM | POA: Insufficient documentation

## 2019-01-12 DIAGNOSIS — R198 Other specified symptoms and signs involving the digestive system and abdomen: Secondary | ICD-10-CM | POA: Insufficient documentation

## 2019-01-15 ENCOUNTER — Ambulatory Visit (HOSPITAL_COMMUNITY)
Admission: RE | Admit: 2019-01-15 | Discharge: 2019-01-15 | Disposition: A | Discharge status: Home / Self Care | Payer: Medicaid Other | Source: Ambulatory Visit | Attending: Gastroenterology | Admitting: Gastroenterology

## 2019-01-15 ENCOUNTER — Other Ambulatory Visit: Payer: Self-pay

## 2019-01-15 DIAGNOSIS — R945 Abnormal results of liver function studies: Secondary | ICD-10-CM | POA: Diagnosis present

## 2019-01-15 DIAGNOSIS — K760 Fatty (change of) liver, not elsewhere classified: Secondary | ICD-10-CM | POA: Insufficient documentation

## 2019-01-15 DIAGNOSIS — R7989 Other specified abnormal findings of blood chemistry: Secondary | ICD-10-CM

## 2019-01-15 DIAGNOSIS — K2990 Gastroduodenitis, unspecified, without bleeding: Secondary | ICD-10-CM | POA: Insufficient documentation

## 2019-01-15 DIAGNOSIS — R198 Other specified symptoms and signs involving the digestive system and abdomen: Secondary | ICD-10-CM | POA: Diagnosis present

## 2019-01-15 DIAGNOSIS — K2970 Gastritis, unspecified, without bleeding: Secondary | ICD-10-CM | POA: Diagnosis present

## 2019-01-20 ENCOUNTER — Other Ambulatory Visit: Payer: Self-pay

## 2019-01-20 DIAGNOSIS — R945 Abnormal results of liver function studies: Secondary | ICD-10-CM

## 2019-01-20 DIAGNOSIS — K760 Fatty (change of) liver, not elsewhere classified: Secondary | ICD-10-CM

## 2019-01-20 DIAGNOSIS — K759 Inflammatory liver disease, unspecified: Secondary | ICD-10-CM

## 2019-01-20 DIAGNOSIS — R7989 Other specified abnormal findings of blood chemistry: Secondary | ICD-10-CM

## 2019-01-21 ENCOUNTER — Other Ambulatory Visit: Payer: Medicaid Other

## 2019-01-21 DIAGNOSIS — K2970 Gastritis, unspecified, without bleeding: Secondary | ICD-10-CM

## 2019-01-21 DIAGNOSIS — R7989 Other specified abnormal findings of blood chemistry: Secondary | ICD-10-CM

## 2019-01-21 DIAGNOSIS — R198 Other specified symptoms and signs involving the digestive system and abdomen: Secondary | ICD-10-CM

## 2019-01-21 DIAGNOSIS — K760 Fatty (change of) liver, not elsewhere classified: Secondary | ICD-10-CM

## 2019-01-21 DIAGNOSIS — R945 Abnormal results of liver function studies: Secondary | ICD-10-CM

## 2019-01-28 LAB — PANCREATIC ELASTASE, FECAL: Pancreatic Elastase-1, Stool: >500 mcg/g

## 2019-02-05 DIAGNOSIS — F4542 Pain disorder with related psychological factors: Secondary | ICD-10-CM | POA: Insufficient documentation

## 2019-04-01 ENCOUNTER — Other Ambulatory Visit: Payer: Self-pay

## 2019-04-01 ENCOUNTER — Encounter: Payer: Self-pay | Admitting: Emergency Medicine

## 2019-04-01 ENCOUNTER — Ambulatory Visit
Admission: EM | Admit: 2019-04-01 | Discharge: 2019-04-01 | Disposition: A | Discharge status: Home / Self Care | Payer: Medicaid Other | Attending: Emergency Medicine | Admitting: Emergency Medicine

## 2019-04-01 DIAGNOSIS — R05 Cough: Secondary | ICD-10-CM | POA: Diagnosis not present

## 2019-04-01 DIAGNOSIS — R059 Cough, unspecified: Secondary | ICD-10-CM

## 2019-04-01 DIAGNOSIS — Z20822 Contact with and (suspected) exposure to covid-19: Secondary | ICD-10-CM

## 2019-04-01 MED ORDER — AZITHROMYCIN 250 MG PO TABS: 250.0000 mg | ORAL_TABLET | Freq: Every day | ORAL | 0 refills | Status: DC

## 2019-04-01 MED ORDER — ALBUTEROL SULFATE HFA 108 (90 BASE) MCG/ACT IN AERS: 1.0000 | INHALATION_SPRAY | Freq: Four times a day (QID) | RESPIRATORY_TRACT | 0 refills | Status: AC | PRN

## 2019-04-01 MED ORDER — AEROCHAMBER PLUS FLO-VU MEDIUM MISC: 1.0000 | Freq: Once | 0 refills | Status: AC

## 2019-04-01 MED ORDER — PREDNISONE 20 MG PO TABS: 20.0000 mg | ORAL_TABLET | Freq: Every day | ORAL | 0 refills | Status: DC

## 2019-04-01 NOTE — ED Provider Notes (Addendum)
[facility]    CSN: [id number] Arrival date & time: 04/01/19  1325      History   Chief Complaint Chief Complaint  Patient presents with  . Exposure to COVID    HPI Angie Brown is a 39 y.o. adult w/ h/o asthma, hypothyroidism  Presenting for Covid testing: Exposure: daughter who tested positive last monday [place] of exposure[place] Any fever, symptoms since exposure: yes - nasal congestion, cough, sore throat, loss of smell.  Used sudafed and albuterol inhaler BID x 3 days w/ relief of symptoms.   Past Medical History:  Diagnosis Date  . Asthma   . Bronchitis   . Depression   . Dysrhythmia    PVCs- stress related  . Hypoglycemia   . Hypothyroidism   . PONV (postoperative nausea and vomiting)    pt prefers scop patch    Patient Active Problem List   Diagnosis Date Noted  . Change in bowel function 01/12/2019  . Fatty liver 01/12/2019  . Gastritis and gastroduodenitis 01/12/2019  . Abnormal LFTs 01/12/2019  . Abdominal pain, right upper quadrant 01/12/2019  . Non-intractable vomiting   . NASH (nonalcoholic steatohepatitis)   . Hepatitis 12/03/2018  . Transaminitis 12/03/2018  . 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 Abdominal pain 06/08/2017  . Chronic constipation 02/14/2017  . Asthma 01/26/2017  . Anaphylaxis 09/29/2016  . Elevated glucose 09/29/2016  . Chronic pain 06/29/2016  . 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 Voiding dysfunction 03/08/2016  . Other insomnia 03/01/2016  . Elevated blood pressure reading 01/05/2016  . GAD (generalized anxiety disorder) 01/05/2016  . Gastroesophageal reflux disease without esophagitis 01/05/2016  . Recurrent major depressive disorder (HCC) 01/05/2016  . Stress incontinence in female 03/11/2014  . HYPOTENSION 03/18/2010  . ARM PAIN 04/28/2009  . ABNORMAL INVOLUNTARY MOVEMENTS 04/28/2009  . FACIAL PARESTHESIA 04/28/2009  . HEADACHE 04/26/2009  . WHEEZING 03/23/2009  .  DIARRHEA, ACUTE, CHRONIC 02/19/2009  . HYDRONEPHROSIS, RIGHT 02/18/2009  . PYELONEPHRITIS, HX OF 02/18/2009  . MIGRAINES, HX OF 02/18/2009  . LUMBAR RADICULOPATHY, RIGHT 12/23/2008  . ASTHMATIC BRONCHITIS, ACUTE 01/22/2008  . BACK PAIN 01/22/2008  . ELEVATED BLOOD PRESSURE WITHOUT DIAGNOSIS OF HYPERTENSION 01/22/2008  . OBESITY, MORBID 05/29/2007  . INSOMNIA-SLEEP DISORDER-UNSPEC 05/29/2007  . HYPERSOMNIA 05/29/2007  . ANEMIA-NOS 01/01/2007  . ANXIETY 01/01/2007  . DEPRESSION 01/01/2007  . COMMON MIGRAINE 01/01/2007  . ASTHMA 01/01/2007  . SYNCOPE 01/01/2007    Past Surgical History:  Procedure Laterality Date  . ABDOMINAL HYSTERECTOMY    . BACK SURGERY  07/2009   spinal fusion L 5-S1  . BIOPSY  12/07/2018   Procedure: BIOPSY;  Surgeon: Meridee Score Netty Starring., MD;  Location: [facility];  Service: Gastroenterology;;  . BLADDER SUSPENSION N/A 03/11/2014   Procedure: TRANSVAGINAL TAPE (TVT) PROCEDURE;  Surgeon: Lavina Hamman, MD;  Location: [facility];  Service: Gynecology;  Laterality: N/A;  . CHOLECYSTECTOMY    . ESOPHAGOGASTRODUODENOSCOPY (EGD) WITH PROPOFOL N/A 12/07/2018   Procedure: ESOPHAGOGASTRODUODENOSCOPY (EGD) WITH PROPOFOL;  Surgeon: Meridee Score Netty Starring., MD;  Location: [facility];  Service: Gastroenterology;  Laterality: N/A;  . TONSILLECTOMY      OB History   No obstetric history on file.      Home Medications    Prior to Admission medications   Medication Sig Start Date End Date Taking? Authorizing Provider  rizatriptan (MAXALT-MLT) 5 MG disintegrating tablet Take 5 mg by mouth daily as needed for migraine.  09/25/18  Yes [provider]  albuterol (  VENTOLIN HFA) 108 (90 Base) MCG/ACT inhaler Inhale 1-2 puffs into the lungs every 6 (six) hours as needed for wheezing or shortness of breath. 04/01/19   Hall-Potvin, [place], PA-C  azithromycin (ZITHROMAX) 250 MG tablet Take 1 tablet (250 mg total) by mouth daily. Take first 2 tablets together, then 1  every day until finished. 04/01/19   Hall-Potvin, [place], PA-C  carvedilol (COREG) 6.25 MG tablet Take 6.25 mg by mouth every evening.    [provider]  clonazePAM (KLONOPIN) 1 MG tablet Take 1 mg by mouth at bedtime.  06/18/18   [provider]  cyclobenzaprine (FLEXERIL) 10 MG tablet Take 10 mg by mouth at bedtime.     [provider]  diphenhydrAMINE (BENADRYL) 25 mg capsule Take 25 mg by mouth at bedtime.    [provider]  EPIPEN 2-PAK 0.3 MG/0.3ML SOAJ injection Inject 0.3 mLs (0.3 mg total) into the muscle as needed (for allergic reaction). 10/13/18   Rolly Salter, MD  escitalopram (LEXAPRO) 10 MG tablet Take 10 mg by mouth at bedtime.    [provider]  fluticasone (FLONASE) 50 MCG/ACT nasal spray Place 1 spray into both nostrils at bedtime.    [provider]  Fructose-Dextrose-Phosphor Acd (NAUSEA CONTROL PO) Take 1 patch by mouth every 3 (three) days.    [provider]  gabapentin (NEURONTIN) 300 MG capsule Take 900 mg by mouth at bedtime.    [provider]  levothyroxine (SYNTHROID) 50 MCG tablet Take 50 mcg by mouth daily before breakfast.    [provider]  montelukast (SINGULAIR) 10 MG tablet Take 10 mg by mouth at bedtime.    [provider]  omeprazole (PRILOSEC) 40 MG capsule Take 1 capsule (40 mg total) by mouth 2 (two) times daily at 8 am and 10 pm. 01/09/19   Mansouraty, Netty Starring., MD  predniSONE (DELTASONE) 20 MG tablet Take 1 tablet (20 mg total) by mouth daily. 04/01/19   Hall-Potvin, [place], PA-C  promethazine (PHENERGAN) 25 MG tablet Take 1 tablet (25 mg total) by mouth every 6 (six) hours as needed for nausea or vomiting. 12/08/18   Narda Bonds, MD  scopolamine (TRANSDERM-SCOP, 1.5 MG,) 1 MG/3DAYS Place 1 patch (1.5 mg total) onto the skin every 3 (three) days. 01/09/19   Mansouraty, Netty Starring., MD  Spacer/Aero-Holding Chambers (AEROCHAMBER PLUS FLO-VU MEDIUM) MISC 1  each by Other route once for 1 dose. 04/01/19 04/01/19  Hall-Potvin, [place], PA-C    Family History Family History  Problem Relation Age of Onset  . Liver disease Mother        Fatty liver, NASH  . GI Bleed Mother   . Hypothyroidism Mother   . Hypothyroidism Sister   . Liver disease Sister        fatty liver  . Liver cancer Maternal Grandfather   . Hypothyroidism Sister   . Colon cancer Neg Hx   . Esophageal cancer Neg Hx   . Rectal cancer Neg Hx   . Inflammatory bowel disease Neg Hx   . Pancreatic cancer Neg Hx   . Stomach cancer Neg Hx     Social History Social History   Tobacco Use  . Smoking status: Current Every Day Smoker    Packs/day: 0.50    Types: Cigarettes  . Smokeless tobacco: Never Used  Substance Use Topics  . Alcohol use: Not Currently  . Drug use: No     Allergies   Loratadine, Wasp venom, Sumatriptan, Doxycycline, Ibuprofen, and  Methocarbamol   Review of Systems Review of Systems  Constitutional: Positive for appetite change. Negative for activity change, fatigue and fever.  HENT: Positive for congestion and sore throat. Negative for dental problem, ear pain, facial swelling, hearing loss, sinus pain, trouble swallowing and voice change.   Eyes: Negative for photophobia, pain and visual disturbance.  Respiratory: Positive for cough and shortness of breath. Negative for chest tightness, wheezing and stridor.        W/ exertion  Cardiovascular: Negative for chest pain, palpitations and leg swelling.  Gastrointestinal: Negative for diarrhea and vomiting.  Musculoskeletal: Negative for arthralgias and myalgias.  Neurological: Negative for dizziness and headaches.     Physical Exam Triage Vital Signs ED Triage Vitals  Enc Vitals Group     BP 04/01/19 1432 112/86     Pulse Rate 04/01/19 1342 (!) 109     Resp 04/01/19 1342 18     Temp 04/01/19 1342 98.2 F (36.8 C)     Temp Source 04/01/19 1342 Temporal     SpO2 04/01/19 1342 96 %     Weight  --      Height --      Head Circumference --      Peak Flow --      Pain Score 04/01/19 1345 8     Pain Loc --      Pain Edu? --      Excl. in [place]? --    No data found.  Updated Vital Signs BP 112/86 (BP Location: Left Arm) Comment: Taken by APP  Pulse (!) 109   Temp 98.2 F (36.8 C) (Temporal)   Resp 18   SpO2 96%   Visual Acuity Right Eye Distance:   Left Eye Distance:   Bilateral Distance:    Right Eye Near:   Left Eye Near:    Bilateral Near:     Physical Exam Constitutional:      General: He is not in acute distress.    Appearance: He is obese. He is ill-appearing. He is not toxic-appearing or diaphoretic.  HENT:     Head: Normocephalic and atraumatic.     Mouth/Throat:     Mouth: Mucous membranes are moist.     Pharynx: Oropharynx is clear. No oropharyngeal exudate or posterior oropharyngeal erythema.  Eyes:     General: No scleral icterus.    Conjunctiva/sclera: Conjunctivae normal.     Pupils: Pupils are equal, round, and reactive to light.  Neck:     Comments: Trachea midline, negative JVD Cardiovascular:     Rate and Rhythm: Regular rhythm. Tachycardia present.     Heart sounds: No murmur. No gallop.      Comments: HR 103 at bedside Pulmonary:     Effort: Pulmonary effort is normal. No respiratory distress.     Breath sounds: No wheezing, rhonchi or rales.     Comments: Decreased BS bilaterally Musculoskeletal:        General: Normal range of motion.     Cervical back: Neck supple. No tenderness.     Right lower leg: No edema.     Left lower leg: No edema.  Lymphadenopathy:     Cervical: No cervical adenopathy.  Skin:    General: Skin is warm.     Capillary Refill: Capillary refill takes less than 2 seconds.     Coloration: Skin is not jaundiced or pale.     Findings: No rash.  Neurological:     General: No focal deficit present.  Mental Status: He is alert and oriented to person, place, and time.      UC Treatments / Results   Labs (all labs ordered are listed, but only abnormal results are displayed) Labs Reviewed  NOVEL CORONAVIRUS, NAA    EKG   Radiology No results found.  Procedures Procedures (including critical care time)  Medications Ordered in UC Medications - No data to display  Initial Impression / Assessment and Plan / UC Course  I have reviewed the triage vital signs and the nursing notes.  Pertinent labs & imaging results that were available during my care of the patient were reviewed by me and considered in my medical decision making (see chart for details).     Patient afebrile, nontoxic, with SpO2 96%.  Covid PCR pending.  Patient to quarantine until results are back.  We will continue supportive management and add Zpack & prednisone for bronchitis vs asthma flare second to viral infection.  Return precautions discussed, patient verbalized understanding and is agreeable to plan. Final Clinical Impressions(s) / UC Diagnoses   Final diagnoses:  Exposure to COVID-19 virus  Cough     Discharge Instructions     Your COVID test is pending - it is important to quarantine / isolate at home until your results are back. If you test positive and would like further evaluation for persistent or worsening symptoms, you may schedule an E-visit or virtual (video) visit throughout the [facility] app or website.  PLEASE NOTE: If you develop severe chest pain or shortness of breath please go to the ER or call [phone] for further evaluation --> DO NOT schedule electronic or virtual visits for this. Please call our office for further guidance / recommendations as needed.    ED Prescriptions    Medication Sig Dispense Auth. Provider   albuterol (VENTOLIN HFA) 108 (90 Base) MCG/ACT inhaler Inhale 1-2 puffs into the lungs every 6 (six) hours as needed for wheezing or shortness of breath. 18 g Hall-Potvin, [place], PA-C   Spacer/Aero-Holding Chambers (AEROCHAMBER PLUS FLO-VU MEDIUM) MISC 1  each by Other route once for 1 dose. 1 each Hall-Potvin, [place], PA-C   predniSONE (DELTASONE) 20 MG tablet Take 1 tablet (20 mg total) by mouth daily. 5 tablet Hall-Potvin, [place], PA-C   azithromycin (ZITHROMAX) 250 MG tablet Take 1 tablet (250 mg total) by mouth daily. Take first 2 tablets together, then 1 every day until finished. 6 tablet Hall-Potvin, [place], PA-C     PDMP not reviewed this encounter.   Hall-Potvin, [place], PA-C 04/01/19 1445    Hall-Potvin, [place], [place] 04/01/19 2053

## 2019-04-01 NOTE — ED Triage Notes (Signed)
Pt presents to [facility] for assessment of nasal congestion, chest congestion, cough, sore throat and loss of sense of smell.

## 2019-04-01 NOTE — Discharge Instructions (Addendum)
Your COVID test is pending - it is important to quarantine / isolate at home until your results are back. °If you test positive and would like further evaluation for persistent or worsening symptoms, you may schedule an E-visit or virtual (video) visit throughout the [facility] MyChart app or website. ° °PLEASE NOTE: If you develop severe chest pain or shortness of breath please go to the ER or call [id number] for further evaluation --> DO NOT schedule electronic or virtual visits for this. °Please call our office for further guidance / recommendations as needed. °

## 2019-04-02 LAB — NOVEL CORONAVIRUS, NAA: SARS-CoV-2, NAA: DETECTED — AB

## 2019-04-03 ENCOUNTER — Telehealth (HOSPITAL_COMMUNITY): Payer: Self-pay | Admitting: Emergency Medicine

## 2019-04-03 NOTE — Telephone Encounter (Signed)

## 2019-05-12 ENCOUNTER — Other Ambulatory Visit: Payer: Self-pay | Admitting: Gastroenterology

## 2019-09-09 DIAGNOSIS — E538 Deficiency of other specified B group vitamins: Secondary | ICD-10-CM | POA: Insufficient documentation

## 2019-09-09 DIAGNOSIS — R5382 Chronic fatigue, unspecified: Secondary | ICD-10-CM | POA: Insufficient documentation

## 2019-09-15 ENCOUNTER — Emergency Department (HOSPITAL_COMMUNITY): Payer: Medicaid Other

## 2019-09-15 ENCOUNTER — Encounter (HOSPITAL_COMMUNITY): Payer: Self-pay

## 2019-09-15 ENCOUNTER — Emergency Department (HOSPITAL_COMMUNITY)
Admission: EM | Admit: 2019-09-15 | Discharge: 2019-09-15 | Disposition: A | Discharge status: Home / Self Care | Payer: Medicaid Other | Attending: Emergency Medicine | Admitting: Emergency Medicine

## 2019-09-15 ENCOUNTER — Other Ambulatory Visit: Payer: Self-pay

## 2019-09-15 DIAGNOSIS — E039 Hypothyroidism, unspecified: Secondary | ICD-10-CM | POA: Diagnosis not present

## 2019-09-15 DIAGNOSIS — R109 Unspecified abdominal pain: Secondary | ICD-10-CM | POA: Diagnosis present

## 2019-09-15 DIAGNOSIS — F17210 Nicotine dependence, cigarettes, uncomplicated: Secondary | ICD-10-CM | POA: Insufficient documentation

## 2019-09-15 DIAGNOSIS — R000 Tachycardia, unspecified: Secondary | ICD-10-CM

## 2019-09-15 DIAGNOSIS — Z79899 Other long term (current) drug therapy: Secondary | ICD-10-CM | POA: Insufficient documentation

## 2019-09-15 DIAGNOSIS — J45909 Unspecified asthma, uncomplicated: Secondary | ICD-10-CM | POA: Diagnosis not present

## 2019-09-15 DIAGNOSIS — N12 Tubulo-interstitial nephritis, not specified as acute or chronic: Secondary | ICD-10-CM

## 2019-09-15 HISTORY — DX: Disorder of kidney and ureter, unspecified: N28.9

## 2019-09-15 LAB — COMPREHENSIVE METABOLIC PANEL
ALT: 36 U/L (ref 0–44)
AST: 44 U/L — ABNORMAL HIGH (ref 15–41)
Albumin: 3.8 g/dL (ref 3.5–5.0)
Alkaline Phosphatase: 107 U/L (ref 38–126)
Anion gap: 12 (ref 5–15)
BUN: 14 mg/dL (ref 6–20)
CO2: 23 mmol/L (ref 22–32)
Calcium: 8.6 mg/dL — ABNORMAL LOW (ref 8.9–10.3)
Chloride: 95 mmol/L — ABNORMAL LOW (ref 98–111)
Creatinine, Ser: 1.31 mg/dL — ABNORMAL HIGH (ref 0.44–1.00)
GFR calc Af Amer: 59 mL/min — ABNORMAL LOW (ref >60)
GFR calc non Af Amer: 51 mL/min — ABNORMAL LOW (ref >60)
Glucose, Bld: 156 mg/dL — ABNORMAL HIGH (ref 70–99)
Potassium: 4.3 mmol/L (ref 3.5–5.1)
Sodium: 130 mmol/L — ABNORMAL LOW (ref 135–145)
Total Bilirubin: 2 mg/dL — ABNORMAL HIGH (ref 0.3–1.2)
Total Protein: 8.2 g/dL — ABNORMAL HIGH (ref 6.5–8.1)

## 2019-09-15 LAB — LACTIC ACID, PLASMA: Lactic Acid, Venous: 1.7 mmol/L (ref 0.5–1.9)

## 2019-09-15 LAB — CBC WITH DIFFERENTIAL/PLATELET
Abs Immature Granulocytes: 0.1 10*3/uL — ABNORMAL HIGH (ref 0.00–0.07)
Basophils Absolute: 0 10*3/uL (ref 0.0–0.1)
Basophils Relative: 0 %
Eosinophils Absolute: 0 10*3/uL (ref 0.0–0.5)
Eosinophils Relative: 0 %
HCT: 46.9 % — ABNORMAL HIGH (ref 36.0–46.0)
Hemoglobin: 15.9 g/dL — ABNORMAL HIGH (ref 12.0–15.0)
Immature Granulocytes: 1 %
Lymphocytes Relative: 5 %
Lymphs Abs: 0.7 10*3/uL (ref 0.7–4.0)
MCH: 32.5 pg (ref 26.0–34.0)
MCHC: 33.9 g/dL (ref 30.0–36.0)
MCV: 95.9 fL (ref 80.0–100.0)
Monocytes Absolute: 0.5 10*3/uL (ref 0.1–1.0)
Monocytes Relative: 3 %
Neutro Abs: 13.5 10*3/uL — ABNORMAL HIGH (ref 1.7–7.7)
Neutrophils Relative %: 91 %
Platelets: 229 10*3/uL (ref 150–400)
RBC: 4.89 MIL/uL (ref 3.87–5.11)
RDW: 12.1 % (ref 11.5–15.5)
WBC: 14.8 10*3/uL — ABNORMAL HIGH (ref 4.0–10.5)
nRBC: 0.2 % (ref 0.0–0.2)

## 2019-09-15 LAB — URINALYSIS, ROUTINE W REFLEX MICROSCOPIC
Bilirubin Urine: NEGATIVE
Glucose, UA: NEGATIVE mg/dL
Ketones, ur: NEGATIVE mg/dL
Nitrite: POSITIVE — AB
Protein, ur: 100 mg/dL — AB
Specific Gravity, Urine: 1.017 (ref 1.005–1.030)
WBC, UA: >50 WBC/hpf — ABNORMAL HIGH (ref 0–5)
pH: 5 (ref 5.0–8.0)

## 2019-09-15 LAB — PREGNANCY, URINE: Preg Test, Ur: NEGATIVE

## 2019-09-15 LAB — I-STAT BETA HCG BLOOD, ED (MC, WL, AP ONLY): I-stat hCG, quantitative: 14.7 m[IU]/mL — ABNORMAL HIGH (ref <5)

## 2019-09-15 MED ORDER — ONDANSETRON HCL 4 MG/2ML IJ SOLN: 4.0000 mg | Freq: Once | INTRAMUSCULAR | Status: DC

## 2019-09-15 MED ORDER — SODIUM CHLORIDE 0.9 % IV BOLUS
1000.0000 mL | Freq: Once | INTRAVENOUS | Status: AC
  Administered 2019-09-15: 1000 mL via INTRAVENOUS

## 2019-09-15 MED ORDER — DIPHENHYDRAMINE HCL 50 MG/ML IJ SOLN
25.0000 mg | Freq: Once | INTRAMUSCULAR | Status: AC
  Administered 2019-09-15: 25 mg via INTRAVENOUS
  Filled 2019-09-15: qty 1

## 2019-09-15 MED ORDER — HYDROMORPHONE HCL 1 MG/ML IJ SOLN
1.0000 mg | Freq: Once | INTRAMUSCULAR | Status: AC
  Administered 2019-09-15: 1 mg via INTRAVENOUS
  Filled 2019-09-15: qty 1

## 2019-09-15 MED ORDER — OXYCODONE-ACETAMINOPHEN 5-325 MG PO TABS: 1.0000 | ORAL_TABLET | Freq: Four times a day (QID) | ORAL | 0 refills | Status: DC | PRN

## 2019-09-15 MED ORDER — SODIUM CHLORIDE 0.9 % IV SOLN
1.0000 g | Freq: Once | INTRAVENOUS | Status: AC
  Administered 2019-09-15: 1 g via INTRAVENOUS
  Filled 2019-09-15: qty 10

## 2019-09-15 MED ORDER — PROMETHAZINE HCL 25 MG PO TABS: 25.0000 mg | ORAL_TABLET | Freq: Four times a day (QID) | ORAL | 0 refills | Status: DC | PRN

## 2019-09-15 MED ORDER — PROMETHAZINE HCL 25 MG/ML IJ SOLN
25.0000 mg | Freq: Once | INTRAMUSCULAR | Status: AC
  Administered 2019-09-15: 25 mg via INTRAVENOUS
  Filled 2019-09-15: qty 1

## 2019-09-15 MED ORDER — METOCLOPRAMIDE HCL 5 MG/ML IJ SOLN
10.0000 mg | Freq: Once | INTRAMUSCULAR | Status: AC
  Administered 2019-09-15: 10 mg via INTRAVENOUS
  Filled 2019-09-15: qty 2

## 2019-09-15 MED ORDER — CEPHALEXIN 500 MG PO CAPS: 500.0000 mg | ORAL_CAPSULE | Freq: Four times a day (QID) | ORAL | 0 refills | Status: DC

## 2019-09-15 MED ORDER — MORPHINE SULFATE (PF) 4 MG/ML IV SOLN
4.0000 mg | Freq: Once | INTRAVENOUS | Status: AC
  Administered 2019-09-15: 4 mg via INTRAVENOUS
  Filled 2019-09-15: qty 1

## 2019-09-15 NOTE — ED Notes (Signed)
Patient unable to void at this time. Patient only voided a small amount enough for culture. Culture at bedside. Patient aware we need another UA.

## 2019-09-15 NOTE — ED Provider Notes (Signed)
[facility]-EMERGENCY DEPT Provider Note   CSN: [id number] Arrival date & time: 09/15/19  1254     History Chief Complaint  Patient presents with  . Flank Pain  . Emesis  . Dysuria    Angie Brown is a 39 y.o. adult hx of depression, previous kidney stone, here with flank pain. Patient states that she has L flank pain for the last 3 days. It is sharp and has no radiation. Patient has associated dysuria and decreased urine output. States that she had a hysterectomy already and denies vaginal discharge. Had previous kidney stones in the past that didn't require surgery.   The history is provided by the patient.       Past Medical History:  Diagnosis Date  . Asthma   . Bronchitis   . Depression   . Dysrhythmia    PVCs- stress related  . Hypoglycemia   . Hypothyroidism   . PONV (postoperative nausea and vomiting)    pt prefers scop patch  . Renal disorder    2008    Patient Active Problem List   Diagnosis Date Noted  . Change in bowel function 01/12/2019  . Fatty liver 01/12/2019  . Gastritis and gastroduodenitis 01/12/2019  . Abnormal LFTs 01/12/2019  . Abdominal pain, right upper quadrant 01/12/2019  . Non-intractable vomiting   . NASH (nonalcoholic steatohepatitis)   . Hepatitis 12/03/2018  . Transaminitis 12/03/2018  . 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 Abdominal pain 06/08/2017  . Chronic constipation 02/14/2017  . Asthma 01/26/2017  . Anaphylaxis 09/29/2016  . Elevated glucose 09/29/2016  . Chronic pain 06/29/2016  . 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 Voiding dysfunction 03/08/2016  . Other insomnia 03/01/2016  . Elevated blood pressure reading 01/05/2016  . GAD (generalized anxiety disorder) 01/05/2016  . Gastroesophageal reflux disease without esophagitis 01/05/2016  . Recurrent major depressive disorder (HCC) 01/05/2016  . Stress incontinence in female 03/11/2014  . HYPOTENSION 03/18/2010  . ARM  PAIN 04/28/2009  . ABNORMAL INVOLUNTARY MOVEMENTS 04/28/2009  . FACIAL PARESTHESIA 04/28/2009  . HEADACHE 04/26/2009  . WHEEZING 03/23/2009  . DIARRHEA, ACUTE, CHRONIC 02/19/2009  . HYDRONEPHROSIS, RIGHT 02/18/2009  . PYELONEPHRITIS, HX OF 02/18/2009  . MIGRAINES, HX OF 02/18/2009  . LUMBAR RADICULOPATHY, RIGHT 12/23/2008  . ASTHMATIC BRONCHITIS, ACUTE 01/22/2008  . BACK PAIN 01/22/2008  . ELEVATED BLOOD PRESSURE WITHOUT DIAGNOSIS OF HYPERTENSION 01/22/2008  . OBESITY, MORBID 05/29/2007  . INSOMNIA-SLEEP DISORDER-UNSPEC 05/29/2007  . HYPERSOMNIA 05/29/2007  . ANEMIA-NOS 01/01/2007  . ANXIETY 01/01/2007  . DEPRESSION 01/01/2007  . COMMON MIGRAINE 01/01/2007  . ASTHMA 01/01/2007  . SYNCOPE 01/01/2007    Past Surgical History:  Procedure Laterality Date  . ABDOMINAL HYSTERECTOMY    . BACK SURGERY  07/2009   spinal fusion L 5-S1  . BIOPSY  12/07/2018   Procedure: BIOPSY;  Surgeon: Meridee Score Netty Starring., MD;  Location: [facility];  Service: Gastroenterology;;  . BLADDER SUSPENSION N/A 03/11/2014   Procedure: TRANSVAGINAL TAPE (TVT) PROCEDURE;  Surgeon: Lavina Hamman, MD;  Location: [facility];  Service: Gynecology;  Laterality: N/A;  . CHOLECYSTECTOMY    . ESOPHAGOGASTRODUODENOSCOPY (EGD) WITH PROPOFOL N/A 12/07/2018   Procedure: ESOPHAGOGASTRODUODENOSCOPY (EGD) WITH PROPOFOL;  Surgeon: Meridee Score Netty Starring., MD;  Location: [facility];  Service: Gastroenterology;  Laterality: N/A;  . TONSILLECTOMY       OB History   No obstetric history on file.     Family History  Problem Relation Age of Onset  . Liver disease  Mother        Fatty liver, NASH  . GI Bleed Mother   . Hypothyroidism Mother   . Hypothyroidism Sister   . Liver disease Sister        fatty liver  . Liver cancer Maternal Grandfather   . Hypothyroidism Sister   . Colon cancer Neg Hx   . Esophageal cancer Neg Hx   . Rectal cancer Neg Hx   . Inflammatory bowel disease Neg Hx   . Pancreatic cancer Neg Hx    . Stomach cancer Neg Hx     Social History   Tobacco Use  . Smoking status: Current Every Day Smoker    Packs/day: 0.50    Types: Cigarettes  . Smokeless tobacco: Never Used  Vaping Use  . Vaping Use: Some days  . Substances: Nicotine, Flavoring  Substance Use Topics  . Alcohol use: Not Currently  . Drug use: No    Home Medications Prior to Admission medications   Medication Sig Start Date End Date Taking? Authorizing Provider  albuterol (PROVENTIL) (2.5 MG/3ML) 0.083% nebulizer solution Take 2.5 mg by nebulization every 6 (six) hours as needed for wheezing or shortness of breath.  04/03/19  Yes [provider]  albuterol (VENTOLIN HFA) 108 (90 Base) MCG/ACT inhaler Inhale 1-2 puffs into the lungs every 6 (six) hours as needed for wheezing or shortness of breath. 04/01/19  Yes Hall-Potvin, [place], PA-C  carvedilol (COREG) 6.25 MG tablet Take 6.25 mg by mouth every evening.   Yes [provider]  clonazePAM (KLONOPIN) 1 MG tablet Take 1 mg by mouth 2 (two) times daily.  06/18/18  Yes [provider]  cyclobenzaprine (FLEXERIL) 10 MG tablet Take 10 mg by mouth at bedtime.    Yes [provider]  diphenhydrAMINE (BENADRYL) 25 mg capsule Take 25 mg by mouth at bedtime as needed for allergies.    Yes [provider]  EPIPEN 2-PAK 0.3 MG/0.3ML SOAJ injection Inject 0.3 mLs (0.3 mg total) into the muscle as needed (for allergic reaction). 10/13/18  Yes Rolly SalterPatel, Pranav M, MD  fluticasone (FLONASE) 50 MCG/ACT nasal spray Place 1 spray into both nostrils at bedtime.   Yes [provider]  folic acid (FOLVITE) 1 MG tablet Take 1 mg by mouth daily. 09/09/19  Yes [provider]  gabapentin (NEURONTIN) 300 MG capsule Take 900 mg by mouth at bedtime.   Yes [provider]  levothyroxine (SYNTHROID) 50 MCG tablet Take 50 mcg by mouth every evening.    Yes [provider]  meloxicam (MOBIC) 15 MG tablet Take 15 mg by mouth  at bedtime.  08/26/19  Yes [provider]  montelukast (SINGULAIR) 10 MG tablet Take 10 mg by mouth at bedtime.   Yes [provider]  omeprazole (PRILOSEC) 40 MG capsule Take 1 capsule (40 mg total) by mouth 2 (two) times daily at 8 am and 10 pm. 01/09/19  Yes Mansouraty, Netty StarringGabriel Jr., MD  promethazine (PHENERGAN) 25 MG tablet Take 1 tablet (25 mg total) by mouth every 6 (six) hours as needed for nausea or vomiting. 12/08/18  Yes Narda BondsNettey, Ralph A, MD  rizatriptan (MAXALT-MLT) 5 MG disintegrating tablet Take 5 mg by mouth daily as needed for migraine.  09/25/18  Yes [provider]  TRANSDERM-SCOP, 1.5 MG, 1 MG/3DAYS PLACE 1 PATCH ONTO THE SKIN EVERY 3 DAYS. Patient taking differently: Place 1 patch onto the skin every 3 (three) days.  05/12/19  Yes Mansouraty, Netty StarringGabriel Jr., MD  venlafaxine  XR (EFFEXOR-XR) 75 MG 24 hr capsule Take 75 mg by mouth every evening.  09/09/19  Yes [provider]  azithromycin (ZITHROMAX) 250 MG tablet Take 1 tablet (250 mg total) by mouth daily. Take first 2 tablets together, then 1 every day until finished. Patient not taking: Reported on 09/15/2019 04/01/19   Hall-Potvin, [place], PA-C  buPROPion (WELLBUTRIN XL) 150 MG 24 hr tablet Take 150 mg by mouth daily. Patient not taking: Reported on 09/15/2019 08/26/19   [provider]  escitalopram (LEXAPRO) 10 MG tablet Take 10 mg by mouth at bedtime. Patient not taking: Reported on 09/15/2019    [provider]  Fructose-Dextrose-Phosphor Acd (NAUSEA CONTROL PO) Take 1 patch by mouth every 3 (three) days. Patient not taking: Reported on 09/15/2019    [provider]  oxyCODONE-acetaminophen (PERCOCET) 10-325 MG tablet Take 1 tablet by mouth every 8 (eight) hours as needed. Patient not taking: Reported on 09/15/2019 09/04/19   [provider]  predniSONE (DELTASONE) 20 MG tablet Take 1 tablet (20 mg total) by mouth daily. Patient not taking: Reported on 09/15/2019  04/01/19   Hall-Potvin, [place], PA-C  Vitamin D, Ergocalciferol, (DRISDOL) 1.25 MG (50000 UNIT) CAPS capsule Take 50,000 Units by mouth once a week. 09/09/19   [provider]    Allergies    Loratadine, Wasp venom, Sumatriptan, Doxycycline, Ibuprofen, and Methocarbamol  Review of Systems   Review of Systems  Gastrointestinal:       L flank pain   All other systems reviewed and are negative.   Physical Exam Updated Vital Signs BP 118/70   Pulse (!) 115   Temp (!) 97.5 F (36.4 C) (Oral)   Resp 14   Ht 5\' 7"  (1.702 m)   Wt 108.9 kg   SpO2 94%   BMI 37.59 kg/m   Physical Exam Vitals and nursing note reviewed.  HENT:     Head: Normocephalic.     Nose: Nose normal.     Mouth/Throat:     Mouth: Mucous membranes are moist.  Eyes:     Extraocular Movements: Extraocular movements intact.     Pupils: Pupils are equal, round, and reactive to light.  Cardiovascular:     Rate and Rhythm: Normal rate and regular rhythm.     Pulses: Normal pulses.     Heart sounds: Normal heart sounds.  Pulmonary:     Effort: Pulmonary effort is normal.     Breath sounds: Normal breath sounds.  Abdominal:     General: Abdomen is flat.     Palpations: Abdomen is soft.     Comments: Mild L CVAT   Musculoskeletal:        General: Normal range of motion.     Cervical back: Normal range of motion.  Skin:    General: Skin is warm.     Capillary Refill: Capillary refill takes less than 2 seconds.  Neurological:     General: No focal deficit present.     Mental Status: He is alert and oriented to person, place, and time.  Psychiatric:        Mood and Affect: Mood normal.        Behavior: Behavior normal.     ED Results / Procedures / Treatments   Labs (all labs ordered are listed, but only abnormal results are displayed) Labs Reviewed  URINALYSIS, ROUTINE W REFLEX MICROSCOPIC - Abnormal; Notable for the following components:      Result Value   Color, Urine YELLOW (*)  APPearance TURBID (*)    Hgb urine dipstick SMALL (*)    Protein, ur 100 (*)    Nitrite POSITIVE (*)    Leukocytes,Ua MODERATE (*)    WBC, UA >50 (*)    Bacteria, UA MANY (*)    All other components within normal limits  CBC WITH DIFFERENTIAL/PLATELET - Abnormal; Notable for the following components:   WBC 14.8 (*)    Hemoglobin 15.9 (*)    HCT 46.9 (*)    Neutro Abs 13.5 (*)    Abs Immature Granulocytes 0.10 (*)    All other components within normal limits  COMPREHENSIVE METABOLIC PANEL - Abnormal; Notable for the following components:   Sodium 130 (*)    Chloride 95 (*)    Glucose, Bld 156 (*)    Creatinine, Ser 1.31 (*)    Calcium 8.6 (*)    Total Protein 8.2 (*)    AST 44 (*)    Total Bilirubin 2.0 (*)    GFR calc non Af Amer 51 (*)    GFR calc Af Amer 59 (*)    All other components within normal limits  I-STAT BETA HCG BLOOD, ED ([facility], [facility], [facility] ONLY) - Abnormal; Notable for the following components:   I-stat hCG, quantitative 14.7 (*)    All other components within normal limits  CULTURE, BLOOD (ROUTINE X 2)  CULTURE, BLOOD (ROUTINE X 2)  URINE CULTURE  PREGNANCY, URINE  LACTIC ACID, PLASMA    EKG None  Radiology CT Renal Stone Study  Result Date: 09/15/2019 CLINICAL DATA:  Left flank pain, vomiting EXAM: CT ABDOMEN AND PELVIS WITHOUT CONTRAST TECHNIQUE: Multidetector CT imaging of the abdomen and pelvis was performed following the standard protocol without IV contrast. COMPARISON:  None. FINDINGS: Lower chest: Platelike atelectasis in the right lung base. Left lung base clear. Heart is normal size. Hepatobiliary: Prior cholecystectomy. Diffuse fatty infiltration of the liver. No focal hepatic abnormality. Pancreas: No focal abnormality or ductal dilatation. Spleen: No focal abnormality.  Normal size. Adrenals/Urinary Tract: Perinephric stranding noted around the left kidney. No hydronephrosis. No renal or ureteral stones. Adrenal glands and urinary bladder unremarkable.  Stomach/Bowel: Stomach, large and small bowel grossly unremarkable. Normal appendix. Vascular/Lymphatic: No evidence of aneurysm or adenopathy. Reproductive: Prior hysterectomy.  No adnexal masses. Other: No free fluid or free air. Musculoskeletal: No acute bony abnormality. IMPRESSION: Extensive stranding noted around the left kidney without hydronephrosis or renal or ureteral stones. This could be related to pyelonephritis or recently passed stone. Diffuse fatty infiltration of the liver. Electronically Signed   By: Charlett Nose M.D.   On: 09/15/2019 18:54    Procedures Procedures (including critical care time)  Medications Ordered in ED Medications  sodium chloride 0.9 % bolus 1,000 mL (0 mLs Intravenous Stopped 09/15/19 2035)  promethazine (PHENERGAN) injection 25 mg (25 mg Intravenous Given 09/15/19 1732)  morphine 4 MG/ML injection 4 mg (4 mg Intravenous Given 09/15/19 1734)  cefTRIAXone (ROCEPHIN) 1 g in sodium chloride 0.9 % 100 mL IVPB (0 g Intravenous Stopped 09/15/19 2035)  sodium chloride 0.9 % bolus 1,000 mL (0 mLs Intravenous Stopped 09/15/19 2036)  sodium chloride 0.9 % bolus 1,000 mL (1,000 mLs Intravenous New Bag/Given 09/15/19 2032)  metoCLOPramide (REGLAN) injection 10 mg (10 mg Intravenous Given 09/15/19 2031)  diphenhydrAMINE (BENADRYL) injection 25 mg (25 mg Intravenous Given 09/15/19 2031)  HYDROmorphone (DILAUDID) injection 1 mg (1 mg Intravenous Given 09/15/19 2031)    ED Course  I have reviewed the triage vital signs  and the nursing notes.  Pertinent labs & imaging results that were available during my care of the patient were reviewed by me and considered in my medical decision making (see chart for details).    MDM Rules/Calculators/A&P                          Angie Brown is a 39 y.o. adult here with L flank pain. Consider pyelo vs renal colic. Will get cbc, cmp, UA, CT renal stone. Will hydrate and reassess.   10:34 PM Labs showed WBC 15. Lactate nl. UA + UTI. CT  showed fat stranding around left kidney consistent with passed stone vs pyelo. Patient given IVF and HR is still around 115. However, she is afebrile and doesn't appear septic. Given rocephin in the ED. Will dc home with keflex. Urine and blood cultures sent. No vomiting in the ED. Stable for discharge. Gave strict return precautions.   Final Clinical Impression(s) / ED Diagnoses Final diagnoses:  None    Rx / DC Orders ED Discharge Orders    None       Charlynne Pander, MD 09/15/19 2235

## 2019-09-15 NOTE — Discharge Instructions (Signed)
Take keflex four times daily for a week for kidney infection   Stay hydrated   Take phenergan for nausea   You have been prescribed oxycodone by your doctor. I ordered 15 extra pills in case you run out. Before you fill it, please make sure its ok with your pain doctor   See your doctor in a week for follow up and reassessment   Return to ER if you have worse flank pain, fever, vomiting.

## 2019-09-15 NOTE — ED Notes (Signed)
Cultures collected before starting antibiotics.

## 2019-09-15 NOTE — ED Notes (Signed)
Pt transported to CT ?

## 2019-09-15 NOTE — ED Triage Notes (Signed)
Patient c/o dysuria x 2 days. Patient states she has been using Azo. Patient also reports a sudden onset of left flank pain and vomiting since this AM.

## 2019-09-16 ENCOUNTER — Telehealth (HOSPITAL_BASED_OUTPATIENT_CLINIC_OR_DEPARTMENT_OTHER): Payer: Self-pay | Admitting: Emergency Medicine

## 2019-09-16 LAB — BLOOD CULTURE ID PANEL (REFLEXED)

## 2019-09-18 LAB — CULTURE, BLOOD (ROUTINE X 2)
Special Requests: ADEQUATE
Special Requests: ADEQUATE

## 2019-09-18 LAB — URINE CULTURE: Culture: >=100000 — AB

## 2019-09-19 ENCOUNTER — Telehealth: Payer: Self-pay | Admitting: *Deleted

## 2019-09-19 ENCOUNTER — Encounter (HOSPITAL_COMMUNITY): Payer: Self-pay

## 2019-09-19 ENCOUNTER — Other Ambulatory Visit: Payer: Self-pay

## 2019-09-19 ENCOUNTER — Emergency Department (HOSPITAL_COMMUNITY)
Admission: EM | Admit: 2019-09-19 | Discharge: 2019-09-19 | Disposition: A | Discharge status: Home / Self Care | Payer: Medicaid Other | Attending: Emergency Medicine | Admitting: Emergency Medicine

## 2019-09-19 DIAGNOSIS — N10 Acute tubulo-interstitial nephritis: Secondary | ICD-10-CM | POA: Insufficient documentation

## 2019-09-19 DIAGNOSIS — F17210 Nicotine dependence, cigarettes, uncomplicated: Secondary | ICD-10-CM | POA: Diagnosis not present

## 2019-09-19 DIAGNOSIS — J45909 Unspecified asthma, uncomplicated: Secondary | ICD-10-CM | POA: Insufficient documentation

## 2019-09-19 DIAGNOSIS — Z79899 Other long term (current) drug therapy: Secondary | ICD-10-CM | POA: Insufficient documentation

## 2019-09-19 DIAGNOSIS — B9689 Other specified bacterial agents as the cause of diseases classified elsewhere: Secondary | ICD-10-CM | POA: Insufficient documentation

## 2019-09-19 DIAGNOSIS — Z7951 Long term (current) use of inhaled steroids: Secondary | ICD-10-CM | POA: Insufficient documentation

## 2019-09-19 DIAGNOSIS — I10 Essential (primary) hypertension: Secondary | ICD-10-CM | POA: Diagnosis not present

## 2019-09-19 DIAGNOSIS — E039 Hypothyroidism, unspecified: Secondary | ICD-10-CM | POA: Insufficient documentation

## 2019-09-19 DIAGNOSIS — B9620 Unspecified Escherichia coli [E. coli] as the cause of diseases classified elsewhere: Secondary | ICD-10-CM | POA: Insufficient documentation

## 2019-09-19 DIAGNOSIS — R7881 Bacteremia: Secondary | ICD-10-CM

## 2019-09-19 DIAGNOSIS — R799 Abnormal finding of blood chemistry, unspecified: Secondary | ICD-10-CM | POA: Diagnosis present

## 2019-09-19 LAB — CBC WITH DIFFERENTIAL/PLATELET
Abs Immature Granulocytes: 0.04 10*3/uL (ref 0.00–0.07)
Basophils Absolute: 0 10*3/uL (ref 0.0–0.1)
Basophils Relative: 1 %
Eosinophils Absolute: 0.1 10*3/uL (ref 0.0–0.5)
Eosinophils Relative: 2 %
HCT: 33.9 % — ABNORMAL LOW (ref 36.0–46.0)
Hemoglobin: 11.2 g/dL — ABNORMAL LOW (ref 12.0–15.0)
Immature Granulocytes: 1 %
Lymphocytes Relative: 11 %
Lymphs Abs: 0.6 10*3/uL — ABNORMAL LOW (ref 0.7–4.0)
MCH: 31.6 pg (ref 26.0–34.0)
MCHC: 33 g/dL (ref 30.0–36.0)
MCV: 95.8 fL (ref 80.0–100.0)
Monocytes Absolute: 0.4 10*3/uL (ref 0.1–1.0)
Monocytes Relative: 6 %
Neutro Abs: 4.5 10*3/uL (ref 1.7–7.7)
Neutrophils Relative %: 79 %
Platelets: 183 10*3/uL (ref 150–400)
RBC: 3.54 MIL/uL — ABNORMAL LOW (ref 3.87–5.11)
RDW: 12.3 % (ref 11.5–15.5)
WBC: 5.7 10*3/uL (ref 4.0–10.5)
nRBC: 0 % (ref 0.0–0.2)

## 2019-09-19 LAB — BASIC METABOLIC PANEL
Anion gap: 14 (ref 5–15)
BUN: 8 mg/dL (ref 6–20)
CO2: 26 mmol/L (ref 22–32)
Calcium: 8.2 mg/dL — ABNORMAL LOW (ref 8.9–10.3)
Chloride: 95 mmol/L — ABNORMAL LOW (ref 98–111)
Creatinine, Ser: 0.89 mg/dL (ref 0.44–1.00)
GFR calc Af Amer: >60 mL/min (ref >60)
GFR calc non Af Amer: >60 mL/min (ref >60)
Glucose, Bld: 119 mg/dL — ABNORMAL HIGH (ref 70–99)
Potassium: 3.5 mmol/L (ref 3.5–5.1)
Sodium: 135 mmol/L (ref 135–145)

## 2019-09-19 MED ORDER — ONDANSETRON HCL 4 MG/2ML IJ SOLN
4.0000 mg | Freq: Once | INTRAMUSCULAR | Status: AC
  Administered 2019-09-19: 4 mg via INTRAVENOUS
  Filled 2019-09-19: qty 2

## 2019-09-19 MED ORDER — PROMETHAZINE HCL 25 MG PO TABS: 25.0000 mg | ORAL_TABLET | Freq: Four times a day (QID) | ORAL | 0 refills | Status: DC | PRN

## 2019-09-19 MED ORDER — SULFAMETHOXAZOLE-TRIMETHOPRIM 800-160 MG PO TABS: 1.0000 | ORAL_TABLET | Freq: Two times a day (BID) | ORAL | 0 refills | Status: AC

## 2019-09-19 MED ORDER — PROMETHAZINE HCL 25 MG PO TABS
25.0000 mg | ORAL_TABLET | Freq: Once | ORAL | Status: AC
  Administered 2019-09-19: 25 mg via ORAL
  Filled 2019-09-19: qty 1

## 2019-09-19 MED ORDER — SODIUM CHLORIDE 0.9 % IV BOLUS
1000.0000 mL | Freq: Once | INTRAVENOUS | Status: AC
  Administered 2019-09-19: 1000 mL via INTRAVENOUS

## 2019-09-19 MED ORDER — SODIUM CHLORIDE 0.9 % IV SOLN
2.0000 g | Freq: Once | INTRAVENOUS | Status: AC
  Administered 2019-09-19: 2 g via INTRAVENOUS
  Filled 2019-09-19: qty 20

## 2019-09-19 NOTE — Telephone Encounter (Signed)
Contacted by patient in response to am voicemail.  States she will have her daughter bring her in.  Emphasized to patient importance of getting proper treatment for (+) blood culture.  Verbalized understanding and states she will come back.

## 2019-09-19 NOTE — ED Notes (Signed)
An After Visit Summary was printed and given to the patient. Discharge instructions given and no further questions at this time.  Pt leaving with sister.  

## 2019-09-19 NOTE — Telephone Encounter (Signed)
Post ED Visit - Positive Culture Follow-up: Successful Patient Follow-Up  Culture assessed and recommendations reviewed by:  []  , Pharm.D. []  Enzo Bi, Pharm.D., BCPS AQ-ID []  , Pharm.D., BCPS []  Celedonio Miyamoto, Pharm.D., BCPS []  [place], Garvin Fila.D., BCPS, AAHIVP []  , Pharm.D., BCPS, AAHIVP []  Georgina Pillion, PharmD, BCPS []  , PharmD, BCPS []  [place], PharmD, BCPS []  [address], PharmD  Positive blood culture  []  Patient discharged without antimicrobial prescription and treatment is now indicated []  Organism is resistant to prescribed ED discharge antimicrobial [x]  Patient with positive blood cultures  Second attempt to contact patient regarding (+) blood cultures and need to return to ED.  Left voicemail and letter sent to address on file.    [facility] 09/19/2019, 11:47 AM

## 2019-09-19 NOTE — Discharge Instructions (Signed)
It is important that you stay hydrated. Take the antibiotic, Bactrim, starting tomorrow,, as directed until gone. You can take Phenergan as directed for nausea. Follow-up with your primary care provider within 2 days. Return to the emergency department for persistent high fever, uncontrollable vomiting, or severely worsening symptoms.

## 2019-09-19 NOTE — ED Triage Notes (Signed)
Patient states that she was called today and notified that she had E. Coli and Enterobacteriaceae present.

## 2019-09-19 NOTE — ED Provider Notes (Signed)
[name]-EMERGENCY DEPT Provider Note   CSN: [id number] Arrival date & time: 09/19/19  1643     History Chief Complaint  Patient presents with  . Abnormal Lab    Angie Brown is a 39 y.o. female presenting to the ED with positive blood cultures.  Patient was seen on 09/15/2019 for pyelonephritis.  She was called today with positive blood cultures for E. coli and enterobacteriaceae.  Her urine culture also grew E. coli, sensitive to the Keflex she is prescribed.  She states 2 days ago she stopped taking the Keflex because she was feeling nauseous with vomiting.  Her pain has been persisting.  Her symptoms have not worsened or gotten any better.  She had fever yesterday, however has not treated any fever today and is afebrile in triage.  She endorses left flank pain.  She endorses urinary frequency though no other urinary symptoms.  The history is provided by the patient and medical records.       Past Medical History:  Diagnosis Date  . Asthma   . Bronchitis   . Depression   . Dysrhythmia    PVCs- stress related  . Hypoglycemia   . Hypothyroidism   . PONV (postoperative nausea and vomiting)    pt prefers scop patch  . Renal disorder    2008    Patient Active Problem List   Diagnosis Date Noted  . Change in bowel function 01/12/2019  . Fatty liver 01/12/2019  . Gastritis and gastroduodenitis 01/12/2019  . Abnormal LFTs 01/12/2019  . Abdominal pain, right upper quadrant 01/12/2019  . Non-intractable vomiting   . NASH (nonalcoholic steatohepatitis)   . Hepatitis 12/03/2018  . Transaminitis 12/03/2018  . 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 Abdominal pain 06/08/2017  . Chronic constipation 02/14/2017  . Asthma 01/26/2017  . Anaphylaxis 09/29/2016  . Elevated glucose 09/29/2016  . Chronic pain 06/29/2016  . 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 Voiding dysfunction 03/08/2016  . Other insomnia 03/01/2016  . Elevated  blood pressure reading 01/05/2016  . GAD (generalized anxiety disorder) 01/05/2016  . Gastroesophageal reflux disease without esophagitis 01/05/2016  . Recurrent major depressive disorder (HCC) 01/05/2016  . Stress incontinence in female 03/11/2014  . HYPOTENSION 03/18/2010  . ARM PAIN 04/28/2009  . ABNORMAL INVOLUNTARY MOVEMENTS 04/28/2009  . FACIAL PARESTHESIA 04/28/2009  . HEADACHE 04/26/2009  . WHEEZING 03/23/2009  . DIARRHEA, ACUTE, CHRONIC 02/19/2009  . HYDRONEPHROSIS, RIGHT 02/18/2009  . PYELONEPHRITIS, HX OF 02/18/2009  . MIGRAINES, HX OF 02/18/2009  . LUMBAR RADICULOPATHY, RIGHT 12/23/2008  . ASTHMATIC BRONCHITIS, ACUTE 01/22/2008  . BACK PAIN 01/22/2008  . ELEVATED BLOOD PRESSURE WITHOUT DIAGNOSIS OF HYPERTENSION 01/22/2008  . OBESITY, MORBID 05/29/2007  . INSOMNIA-SLEEP DISORDER-UNSPEC 05/29/2007  . HYPERSOMNIA 05/29/2007  . ANEMIA-NOS 01/01/2007  . ANXIETY 01/01/2007  . DEPRESSION 01/01/2007  . COMMON MIGRAINE 01/01/2007  . ASTHMA 01/01/2007  . SYNCOPE 01/01/2007    Past Surgical History:  Procedure Laterality Date  . ABDOMINAL HYSTERECTOMY    . BACK SURGERY  07/2009   spinal fusion L 5-S1  . BIOPSY  12/07/2018   Procedure: BIOPSY;  Surgeon: Meridee Score Netty Starring., MD;  Location: [facility];  Service: Gastroenterology;;  . BLADDER SUSPENSION N/A 03/11/2014   Procedure: TRANSVAGINAL TAPE (TVT) PROCEDURE;  Surgeon: Lavina Hamman, MD;  Location: [facility];  Service: Gynecology;  Laterality: N/A;  . CHOLECYSTECTOMY    . ESOPHAGOGASTRODUODENOSCOPY (EGD) WITH PROPOFOL N/A 12/07/2018   Procedure: ESOPHAGOGASTRODUODENOSCOPY (EGD) WITH PROPOFOL;  Surgeon:  Mansouraty, Netty Starring., MD;  Location: [facility];  Service: Gastroenterology;  Laterality: N/A;  . TONSILLECTOMY       OB History   No obstetric history on file.     Family History  Problem Relation Age of Onset  . Liver disease Mother        Fatty liver, NASH  . GI Bleed Mother   . Hypothyroidism Mother    . Hypothyroidism Sister   . Liver disease Sister        fatty liver  . Liver cancer Maternal Grandfather   . Hypothyroidism Sister   . Colon cancer Neg Hx   . Esophageal cancer Neg Hx   . Rectal cancer Neg Hx   . Inflammatory bowel disease Neg Hx   . Pancreatic cancer Neg Hx   . Stomach cancer Neg Hx     Social History   Tobacco Use  . Smoking status: Current Every Day Smoker    Packs/day: 0.50    Types: Cigarettes  . Smokeless tobacco: Never Used  Vaping Use  . Vaping Use: Some days  . Substances: Nicotine, Flavoring  Substance Use Topics  . Alcohol use: Not Currently  . Drug use: No    Home Medications Prior to Admission medications   Medication Sig Start Date End Date Taking? Authorizing Provider  albuterol (PROVENTIL) (2.5 MG/3ML) 0.083% nebulizer solution Take 2.5 mg by nebulization every 6 (six) hours as needed for wheezing or shortness of breath.  04/03/19   [provider]  albuterol (VENTOLIN HFA) 108 (90 Base) MCG/ACT inhaler Inhale 1-2 puffs into the lungs every 6 (six) hours as needed for wheezing or shortness of breath. 04/01/19   Hall-Potvin, [place], PA-C  azithromycin (ZITHROMAX) 250 MG tablet Take 1 tablet (250 mg total) by mouth daily. Take first 2 tablets together, then 1 every day until finished. Patient not taking: Reported on 09/15/2019 04/01/19   Hall-Potvin, [place], PA-C  buPROPion (WELLBUTRIN XL) 150 MG 24 hr tablet Take 150 mg by mouth daily. Patient not taking: Reported on 09/15/2019 08/26/19   [provider]  carvedilol (COREG) 6.25 MG tablet Take 6.25 mg by mouth every evening.    [provider]  clonazePAM (KLONOPIN) 1 MG tablet Take 1 mg by mouth 2 (two) times daily.  06/18/18   [provider]  cyclobenzaprine (FLEXERIL) 10 MG tablet Take 10 mg by mouth at bedtime.     [provider]  diphenhydrAMINE (BENADRYL) 25 mg capsule Take 25 mg by mouth at bedtime as needed for allergies.     [provider]  EPIPEN 2-PAK 0.3 MG/0.3ML SOAJ injection Inject 0.3 mLs (0.3 mg total) into the muscle as needed (for allergic reaction). 10/13/18   Rolly Salter, MD  escitalopram (LEXAPRO) 10 MG tablet Take 10 mg by mouth at bedtime. Patient not taking: Reported on 09/15/2019    [provider]  fluticasone (FLONASE) 50 MCG/ACT nasal spray Place 1 spray into both nostrils at bedtime.    [provider]  folic acid (FOLVITE) 1 MG tablet Take 1 mg by mouth daily. 09/09/19   [provider]  Fructose-Dextrose-Phosphor Acd (NAUSEA CONTROL PO) Take 1 patch by mouth every 3 (three) days. Patient not taking: Reported on 09/15/2019    [provider]  gabapentin (NEURONTIN) 300 MG capsule Take 900 mg by mouth at bedtime.    [provider]  levothyroxine (SYNTHROID) 50 MCG tablet Take 50 mcg by mouth every evening.     [provider]  meloxicam (MOBIC) 15 MG tablet Take 15 mg by mouth at bedtime.  08/26/19   [provider]  montelukast (SINGULAIR) 10 MG tablet Take 10 mg by mouth at bedtime.    [provider]  omeprazole (PRILOSEC) 40 MG capsule Take 1 capsule (40 mg total) by mouth 2 (two) times daily at 8 am and 10 pm. 01/09/19   Mansouraty, Netty Starring., MD  oxyCODONE-acetaminophen (PERCOCET) 10-325 MG tablet Take 1 tablet by mouth every 8 (eight) hours as needed. Patient not taking: Reported on 09/15/2019 09/04/19   [provider]  oxyCODONE-acetaminophen (PERCOCET) 5-325 MG tablet Take 1 tablet by mouth every 6 (six) hours as needed. 09/15/19   Charlynne Pander, MD  predniSONE (DELTASONE) 20 MG tablet Take 1 tablet (20 mg total) by mouth daily. Patient not taking: Reported on 09/15/2019 04/01/19   Hall-Potvin, [place], PA-C  promethazine (PHENERGAN) 25 MG tablet Take 1 tablet (25 mg total) by mouth every 6 (six) hours as needed for nausea or vomiting. 09/15/19   Charlynne Pander, MD  promethazine (PHENERGAN) 25 MG tablet  Take 1 tablet (25 mg total) by mouth every 6 (six) hours as needed for nausea or vomiting. 09/19/19   [place] Britten, [place] N, PA-C  rizatriptan (MAXALT-MLT) 5 MG disintegrating tablet Take 5 mg by mouth daily as needed for migraine.  09/25/18   [provider]  sulfamethoxazole-trimethoprim (BACTRIM DS) 800-160 MG tablet Take 1 tablet by mouth 2 (two) times daily for 10 days. 09/19/19 09/29/19  Sandrina Heaton, [place] N, PA-C  TRANSDERM-SCOP, 1.5 MG, 1 MG/3DAYS PLACE 1 PATCH ONTO THE SKIN EVERY 3 DAYS. Patient taking differently: Place 1 patch onto the skin every 3 (three) days.  05/12/19   Mansouraty, Netty Starring., MD  venlafaxine XR (EFFEXOR-XR) 75 MG 24 hr capsule Take 75 mg by mouth every evening.  09/09/19   [provider]  Vitamin D, Ergocalciferol, (DRISDOL) 1.25 MG (50000 UNIT) CAPS capsule Take 50,000 Units by mouth once a week. 09/09/19   [provider]    Allergies    Loratadine, Wasp venom, Sumatriptan, Doxycycline, Ibuprofen, and Methocarbamol  Review of Systems   Review of Systems  All other systems reviewed and are negative.   Physical Exam Updated Vital Signs BP 121/82   Pulse 97   Temp 98.5 F (36.9 C) (Oral)   Resp 18   Ht 5\' 7"  (1.702 m)   Wt 108.9 kg   SpO2 99%   BMI 37.59 kg/m   Physical Exam Vitals and nursing note reviewed.  Constitutional:      General: She is not in acute distress.    Appearance: She is well-developed.  HENT:     Head: Normocephalic and atraumatic.  Eyes:     Conjunctiva/sclera: Conjunctivae normal.  Cardiovascular:     Rate and Rhythm: Regular rhythm. Tachycardia present.  Pulmonary:     Effort: Pulmonary effort is normal. No respiratory distress.     Breath sounds: Normal breath sounds.  Abdominal:     General: Bowel sounds are normal.     Palpations: Abdomen is soft.     Tenderness: There is left CVA tenderness.  Skin:    General: Skin is warm.  Neurological:     Mental Status: She is alert.  Psychiatric:         Behavior: Behavior normal.     ED Results / Procedures / Treatments   Labs (all labs ordered are listed, but only abnormal results are displayed) Labs Reviewed  BASIC METABOLIC PANEL - Abnormal; Notable for the following components:      Result Value   Chloride 95 (*)    Glucose, Bld 119 (*)    Calcium 8.2 (*)    All other components within normal limits  CBC WITH DIFFERENTIAL/PLATELET - Abnormal; Notable for the following components:   RBC 3.54 (*)    Hemoglobin 11.2 (*)    HCT 33.9 (*)    Lymphs Abs 0.6 (*)    All other components within normal limits    EKG None  Radiology No results found.  Procedures Procedures (including critical care time)  Medications Ordered in ED Medications  ondansetron (ZOFRAN) injection 4 mg (4 mg Intravenous Given 09/19/19 1806)  cefTRIAXone (ROCEPHIN) 2 g in sodium chloride 0.9 % 100 mL IVPB (0 g Intravenous Stopped 09/19/19 1907)  sodium chloride 0.9 % bolus 1,000 mL (0 mLs Intravenous Stopped 09/19/19 1945)  promethazine (PHENERGAN) tablet 25 mg (25 mg Oral Given 09/19/19 1942)    ED Course  I have reviewed the triage vital signs and the nursing notes.  Pertinent labs & imaging results that were available during my care of the patient were reviewed by me and considered in my medical decision making (see chart for details).    MDM Rules/Calculators/A&P                          Patient presenting to the ED after recent diagnosis of pyelonephritis on 09/15/2019 with positive blood cultures.  Blood cultures grew E. coli and Enterobacter species.  Unfortunately, patient stopped taking her prescribed Keflex 2 days ago due to nausea and vomiting.  She was unaware this was a common symptom of pyelonephritis.  Her symptoms have not worsened, however they have not improved.  She reports fever yesterday, however is afebrile here today with the absence of antipyretics.  She is slightly tachycardic on arrival, afebrile, normal blood pressure.  She is given  IV fluids, a dose of Rocephin, and antiemetics.  Heart rate improved, labs are very reassuring, leukocytosis, initial ED visit has resolved.  Normal renal function.  Will discharge patient on Bactrim, and instructed to discontinue the Keflex.  This should cover E. coli and Enterobacter species as well.  She is instructed of importance of adhering to antibiotic regimen and importance of close follow-up.  She is instructed of strict return precautions.  She verbalized understanding and agrees with care plan.  Prescribed refill of Phenergan to help with her nausea.  Safe for discharge.  Patient with Dr. Effie ShyWentz.  Discussed results, findings, treatment and follow up. Patient advised of return precautions. Patient verbalized understanding and agreed with plan.  Final Clinical Impression(s) / ED Diagnoses Final diagnoses:  Pyelonephritis, acute  E coli bacteremia  Bacteremia due to Enterobacter species    Rx / DC Orders ED Discharge Orders         Ordered    promethazine (PHENERGAN) 25 MG tablet  Every 6 hours PRN     Discontinue  Reprint     09/19/19 1930    sulfamethoxazole-trimethoprim (BACTRIM DS) 800-160 MG tablet  2 times daily     Discontinue  Reprint     09/19/19 1930           Julien Oscar, [place] N, PA-C 09/19/19 2107    Mancel BaleWentz, [place], MD 09/20/19 [id number]

## 2019-09-30 ENCOUNTER — Other Ambulatory Visit: Payer: Self-pay | Admitting: Gastroenterology

## 2019-10-09 ENCOUNTER — Other Ambulatory Visit: Payer: Self-pay | Admitting: Gastroenterology

## 2019-10-09 ENCOUNTER — Telehealth: Payer: Self-pay | Admitting: Gastroenterology

## 2019-10-09 MED ORDER — SCOPOLAMINE 1 MG/3DAYS TD PT72: MEDICATED_PATCH | TRANSDERMAL | 0 refills | Status: DC

## 2019-10-09 NOTE — Telephone Encounter (Addendum)
Refill sent with no refills until pt is seen in office on Aug 5th 2021.

## 2019-10-23 ENCOUNTER — Ambulatory Visit: Payer: Medicaid Other | Admitting: Gastroenterology

## 2019-11-26 ENCOUNTER — Other Ambulatory Visit: Payer: Self-pay | Admitting: Gastroenterology

## 2019-12-19 ENCOUNTER — Encounter: Payer: Self-pay | Admitting: Gastroenterology

## 2019-12-19 ENCOUNTER — Other Ambulatory Visit (INDEPENDENT_AMBULATORY_CARE_PROVIDER_SITE_OTHER): Payer: Medicaid Other

## 2019-12-19 ENCOUNTER — Ambulatory Visit (INDEPENDENT_AMBULATORY_CARE_PROVIDER_SITE_OTHER): Payer: Medicaid Other | Admitting: Gastroenterology

## 2019-12-19 VITALS — BP 130/88 | HR 90 | Ht 67.0 in | Wt 224.8 lb

## 2019-12-19 DIAGNOSIS — R7989 Other specified abnormal findings of blood chemistry: Secondary | ICD-10-CM

## 2019-12-19 DIAGNOSIS — K649 Unspecified hemorrhoids: Secondary | ICD-10-CM

## 2019-12-19 DIAGNOSIS — R635 Abnormal weight gain: Secondary | ICD-10-CM

## 2019-12-19 DIAGNOSIS — K625 Hemorrhage of anus and rectum: Secondary | ICD-10-CM

## 2019-12-19 DIAGNOSIS — R945 Abnormal results of liver function studies: Secondary | ICD-10-CM

## 2019-12-19 DIAGNOSIS — R110 Nausea: Secondary | ICD-10-CM

## 2019-12-19 LAB — COMPREHENSIVE METABOLIC PANEL
ALT: 15 U/L (ref 0–35)
AST: 21 U/L (ref 0–37)
Albumin: 3.9 g/dL (ref 3.5–5.2)
Alkaline Phosphatase: 91 U/L (ref 39–117)
BUN: 8 mg/dL (ref 6–23)
CO2: 28 mEq/L (ref 19–32)
Calcium: 9 mg/dL (ref 8.4–10.5)
Chloride: 101 mEq/L (ref 96–112)
Creatinine, Ser: 0.84 mg/dL (ref 0.40–1.20)
GFR: 74.9 mL/min (ref >60.00)
Glucose, Bld: 99 mg/dL (ref 70–99)
Potassium: 3.8 mEq/L (ref 3.5–5.1)
Sodium: 137 mEq/L (ref 135–145)
Total Bilirubin: 0.4 mg/dL (ref 0.2–1.2)
Total Protein: 7.3 g/dL (ref 6.0–8.3)

## 2019-12-19 LAB — IBC + FERRITIN
Ferritin: 96.8 ng/mL (ref 10.0–291.0)
Iron: 60 ug/dL (ref 42–145)
Saturation Ratios: 18.8 % — ABNORMAL LOW (ref 20.0–50.0)
Transferrin: 228 mg/dL (ref 212.0–360.0)

## 2019-12-19 LAB — CBC
HCT: 38.3 % (ref 36.0–46.0)
Hemoglobin: 13.1 g/dL (ref 12.0–15.0)
MCHC: 34.2 g/dL (ref 30.0–36.0)
MCV: 92.2 fl (ref 78.0–100.0)
Platelets: 256 10*3/uL (ref 150.0–400.0)
RBC: 4.15 Mil/uL (ref 3.87–5.11)
RDW: 13.4 % (ref 11.5–15.5)
WBC: 7.6 10*3/uL (ref 4.0–10.5)

## 2019-12-19 LAB — PROTIME-INR
INR: 1 ratio (ref 0.8–1.0)
Prothrombin Time: 10.8 s (ref 9.6–13.1)

## 2019-12-19 MED ORDER — SCOPOLAMINE 1 MG/3DAYS TD PT72: MEDICATED_PATCH | TRANSDERMAL | 1 refills | Status: DC

## 2019-12-19 MED ORDER — SUPREP BOWEL PREP KIT 17.5-3.13-1.6 GM/177ML PO SOLN
1.0000 | ORAL | 0 refills | Status: DC
Start: 2019-12-19 — End: 2020-01-06

## 2019-12-19 MED ORDER — PROMETHAZINE HCL 25 MG PO TABS: 25.0000 mg | ORAL_TABLET | Freq: Four times a day (QID) | ORAL | 1 refills | Status: DC | PRN

## 2019-12-19 NOTE — Patient Instructions (Addendum)
You have been scheduled for an endoscopy and colonoscopy. Please follow the written instructions given to you at your visit today. Please pick up your prep supplies at the pharmacy within the next 1-3 days. If you use inhalers (even only as needed), please bring them with you on the day of your procedure.   We have sent the following medications to your pharmacy for you to pick up at your convenience: Suprep, Phenergan, Scopolamine Patches.   Referral has been sent to Dr. Dalbert Garnet for [facility]. If you have not heard from their in office in 1-2 weeks, please contact them at [phone].   Your provider has requested that you go to the basement level for lab work before leaving today. Press "B" on the elevator. The lab is located at the first door on the left as you exit the elevator.  Due to recent changes in healthcare laws, you may see the results of your imaging and laboratory studies on MyChart before your provider has had a chance to review them.  We understand that in some cases there may be results that are confusing or concerning to you. Not all laboratory results come back in the same time frame and the provider may be waiting for multiple results in order to interpret others.  Please give [place] 48 hours in order for your provider to thoroughly review all the results before contacting the office for clarification of your results.   If you are age 40 or older, your body mass index should be between 23-30. Your Body mass index is 35.21 kg/m. If this is out of the aforementioned range listed, please consider follow up with your Primary Care Provider.  If you are age 40 or younger, your body mass index should be between 19-25. Your Body mass index is 35.21 kg/m. If this is out of the aformentioned range listed, please consider follow up with your Primary Care Provider.    Thank you for choosing me and [facility].  Dr. Meridee Score

## 2019-12-22 ENCOUNTER — Encounter: Payer: Self-pay | Admitting: Gastroenterology

## 2019-12-22 DIAGNOSIS — K625 Hemorrhage of anus and rectum: Secondary | ICD-10-CM | POA: Insufficient documentation

## 2019-12-22 DIAGNOSIS — R110 Nausea: Secondary | ICD-10-CM | POA: Insufficient documentation

## 2019-12-22 DIAGNOSIS — K649 Unspecified hemorrhoids: Secondary | ICD-10-CM | POA: Insufficient documentation

## 2019-12-22 NOTE — Progress Notes (Signed)
[place] VISIT   Primary Care Provider Karleen Hampshire., MD [address] [place] [address] [phone]  Patient Profile: Angie Brown is a 39 y.o. female with a pmh significant for asthma/bronchitis, depression, arrhythmia (PVCs), hypothyroidism, hemorrhoidal disease, status post cholecystectomy, likely fatty liver disease.  The patient presents to the [facility] for an evaluation and management of problem(s) noted below:  Problem List 1. BRBPR (bright red blood per rectum)   2. Abnormal LFTs   3. Nausea without vomiting   4. Hemorrhoids, unspecified hemorrhoid type     History of Present Illness Please see initial consultation note for full details of HPI  Interval History This is the first visit for the patient in a year.  She has been dealing with kidney issues most recently as well as a kidney infection.  Even with this occurring and having been treated, the patient continues to have daily nausea.  She uses scopolamine patches and antiemetic therapy and switches them every few days in effort of trying to let her be functional.  She has been able to lose some weight which has been helpful for her.  She does not believe this is a result of the persistent nausea.  Is still taking PPI.  She still takes NSAIDs as needed.  She has noted some episodes of bright red blood per rectum.  She also is interested in seeing how her liver tests look compared to prior.  Last colonoscopy was many years ago and showed hemorrhoidal disease.  The patient denies any issues with jaundice, scleral icterus, pruritus, darkened/amber urine, clay-colored stools, LE edema, hematemesis, coffee-ground emesis, confusion.  GI Review of Systems Positive as above including bloating  Negative for dysphagia, odynophagia, melena   Review of Systems General: Denies fevers/chills Cardiovascular: Denies chest pain Pulmonary: Denies shortness of  breath Gastroenterological: See HPI Genitourinary: Denies darkened urine Hematological: Denies easy bruising/bleeding Dermatological: Denies jaundice Psychological: Mood is stable   Medications Current Outpatient Medications  Medication Sig Dispense Refill  . albuterol (PROVENTIL) (2.5 MG/3ML) 0.083% nebulizer solution Take 2.5 mg by nebulization every 6 (six) hours as needed for wheezing or shortness of breath.     Marland Kitchen albuterol (VENTOLIN HFA) 108 (90 Base) MCG/ACT inhaler Inhale 1-2 puffs into the lungs every 6 (six) hours as needed for wheezing or shortness of breath. 18 g 0  . carvedilol (COREG) 6.25 MG tablet Take 6.25 mg by mouth every evening.    . clonazePAM (KLONOPIN) 1 MG tablet Take 1 mg by mouth 2 (two) times daily.     . cyclobenzaprine (FLEXERIL) 10 MG tablet Take 10 mg by mouth at bedtime.     . diphenhydrAMINE (BENADRYL) 25 mg capsule Take 25 mg by mouth at bedtime as needed for allergies.     Marland Kitchen EPIPEN 2-PAK 0.3 MG/0.3ML SOAJ injection Inject 0.3 mLs (0.3 mg total) into the muscle as needed (for allergic reaction). 2 each 0  . fluticasone (FLONASE) 50 MCG/ACT nasal spray Place 1 spray into both nostrils at bedtime.    . folic acid (FOLVITE) 1 MG tablet Take 1 mg by mouth daily.    Marland Kitchen gabapentin (NEURONTIN) 300 MG capsule Take 900 mg by mouth at bedtime.    Marland Kitchen levothyroxine (SYNTHROID) 50 MCG tablet Take 50 mcg by mouth every evening.     . meloxicam (MOBIC) 15 MG tablet Take 15 mg by mouth at bedtime.     . montelukast (SINGULAIR) 10 MG tablet Take 10  mg by mouth at bedtime.    Marland Kitchen omeprazole (PRILOSEC) 40 MG capsule Take 1 capsule (40 mg total) by mouth 2 (two) times daily at 8 am and 10 pm. 60 capsule 2  . oxyCODONE-acetaminophen (PERCOCET) 10-325 MG tablet Take 1 tablet by mouth every 8 (eight) hours as needed for pain.    . promethazine (PHENERGAN) 25 MG tablet Take 1 tablet (25 mg total) by mouth every 6 (six) hours as needed for nausea or vomiting. 20 tablet 0  .  promethazine (PHENERGAN) 25 MG tablet Take 1 tablet (25 mg total) by mouth every 6 (six) hours as needed for nausea or vomiting. 30 tablet 1  . scopolamine (TRANSDERM-SCOP, 1.5 MG,) 1 MG/3DAYS PLACE 1 PATCH ONTO THE SKIN EVERY 3 DAYS. 6 patch 1  . Vitamin D, Ergocalciferol, (DRISDOL) 1.25 MG (50000 UNIT) CAPS capsule Take 50,000 Units by mouth once a week.    . Na Sulfate-K Sulfate-Mg Sulf (SUPREP BOWEL PREP KIT) 17.5-3.13-1.6 GM/177ML SOLN Take 1 kit by mouth as directed. For colonoscopy prep 354 mL 0   No current facility-administered medications for this visit.    Allergies Allergies  Allergen Reactions  . Loratadine Shortness Of Breath, Swelling and Palpitations    hypoventilation   . Wasp Venom Anaphylaxis  . Sumatriptan Other (See Comments)    sinus pain, sinus on fire and face hurts  . Doxycycline Nausea And Vomiting  . Ibuprofen Nausea And Vomiting    GI upset  . Methocarbamol Rash    Histories Past Medical History:  Diagnosis Date  . Asthma   . Bronchitis   . Depression   . Dysrhythmia    PVCs- stress related  . Hypoglycemia   . Hypothyroidism   . PONV (postoperative nausea and vomiting)    pt prefers scop patch  . Renal disorder    2008   Past Surgical History:  Procedure Laterality Date  . ABDOMINAL HYSTERECTOMY    . BACK SURGERY  07/2009   spinal fusion L 5-S1  . BIOPSY  12/07/2018   Procedure: BIOPSY;  Surgeon: Rush Landmark Telford Nab., MD;  Location: [facility];  Service: Gastroenterology;;  . BLADDER SUSPENSION N/A 03/11/2014   Procedure: TRANSVAGINAL TAPE (TVT) PROCEDURE;  Surgeon: Cheri Fowler, MD;  Location: [facility];  Service: Gynecology;  Laterality: N/A;  . CHOLECYSTECTOMY    . ESOPHAGOGASTRODUODENOSCOPY (EGD) WITH PROPOFOL N/A 12/07/2018   Procedure: ESOPHAGOGASTRODUODENOSCOPY (EGD) WITH PROPOFOL;  Surgeon: Rush Landmark Telford Nab., MD;  Location: [facility];  Service: Gastroenterology;  Laterality: N/A;  . TONSILLECTOMY AND ADENOIDECTOMY      Social History   Socioeconomic History  . Marital status: Divorced    Spouse name: Not on file  . Number of children: 3  . Years of education: Not on file  . Highest education level: Not on file  Occupational History  . Occupation: housewife  Tobacco Use  . Smoking status: Current Every Day Smoker    Packs/day: 0.50    Types: Cigarettes  . Smokeless tobacco: Never Used  Vaping Use  . Vaping Use: Some days  . Substances: Nicotine, Flavoring  Substance and Sexual Activity  . Alcohol use: Not Currently  . Drug use: No  . Sexual activity: Yes    Partners: Male    Birth control/protection: Surgical  Other Topics Concern  . Not on file  Social History Narrative  . Not on file   Social Determinants of Health   Financial Resource Strain:   . Difficulty of Paying Living Expenses: Not on  file  Food Insecurity:   . Worried About Charity fundraiser in the Last Year: Not on file  . Ran Out of Food in the Last Year: Not on file  Transportation Needs:   . Lack of Transportation (Medical): Not on file  . Lack of Transportation (Non-Medical): Not on file  Physical Activity:   . Days of Exercise per Week: Not on file  . Minutes of Exercise per Session: Not on file  Stress:   . Feeling of Stress : Not on file  Social Connections:   . Frequency of Communication with Friends and Family: Not on file  . Frequency of Social Gatherings with Friends and Family: Not on file  . Attends Religious Services: Not on file  . Active Member of Clubs or Organizations: Not on file  . Attends Archivist Meetings: Not on file  . Marital Status: Not on file  Intimate Partner Violence:   . Fear of Current or Ex-Partner: Not on file  . Emotionally Abused: Not on file  . Physically Abused: Not on file  . Sexually Abused: Not on file   Family History  Problem Relation Age of Onset  . Liver disease Mother        Fatty liver, NASH  . GI Bleed Mother   . Hypothyroidism Mother   .  Hypothyroidism Sister   . Liver disease Sister        fatty liver  . Liver cancer Maternal Grandfather   . Liver disease Maternal Grandfather        fatty liver  . Hypothyroidism Sister   . Liver disease Sister        fatty liver  . Thyroid cancer Sister   . Colon cancer Neg Hx   . Esophageal cancer Neg Hx   . Rectal cancer Neg Hx   . Inflammatory bowel disease Neg Hx   . Pancreatic cancer Neg Hx   . Stomach cancer Neg Hx    I have reviewed her medical, social, and family history in detail and updated the electronic medical record as necessary.    PHYSICAL EXAMINATION  BP 130/88   Pulse 90   Ht $R'5\' 7"'Fc$  (1.702 m)   Wt 224 lb 12.8 oz (102 kg)   SpO2 97%   BMI 35.21 kg/m  Wt Readings from Last 3 Encounters:  12/19/19 224 lb 12.8 oz (102 kg)  09/19/19 240 lb (108.9 kg)  09/15/19 240 lb (108.9 kg)  GEN: NAD, appears stated age, doesn't appear chronically ill PSYCH: Cooperative, without pressured speech EYE: Conjunctivae pink, sclerae anicteric ENT: MMM CV: Nontachycardic RESP: No audible wheezing GI: NABS, soft, rounded, obese, this to palpation in midepigastrium, no guarding, no rebound MSK/EXT: Bilateral pedal edema present SKIN: No jaundice, no spider angiomata NEURO:  Alert & Oriented x 3, no focal deficits   REVIEW OF DATA  I reviewed the following data at the time of this encounter:  GI Procedures and Studies  Previously reviewed  Laboratory Studies  Reviewed those in epic but nothing done within the last year  Imaging Studies  No new imaging to review   ASSESSMENT  Ms. Roebuck is a 39 y.o. female with a pmh significant for asthma/bronchitis, depression, arrhythmia (PVCs), hypothyroidism, hemorrhoidal disease, status post cholecystectomy, likely fatty liver disease.  The patient is seen today for evaluation and management of:  1. BRBPR (bright red blood per rectum)   2. Abnormal LFTs   3. Nausea without vomiting   4. Hemorrhoids, unspecified  hemorrhoid type     The patient is hemodynamically stable.  Clinically however, she has new onset bright red blood per rectum.  Sounds to be anorectal and most likely hemorrhoidal based on previous colonoscopy years ago.  However this has been many years a diagnostic colonoscopy is recommended.  If the patient has significant internal hemorrhoidal disease burden then we will hopefully be able to consider role of banding versus if she has external hemorrhoidal disease the need for colorectal surgery evaluation.  The patient's abnormal LFT pattern needs to be further define and see where things stand almost a year since her last evaluation.  We will see how her labs look and then determine further work-up that may be required.  We previously discussed the potential role of a liver biopsy and will keep that in mind based on how things are although I suspect that her fatty liver seems to be the more likely etiology.  Hopefully we see some benefit with the weight loss that she has been able to maintain.  Patient's symptoms of nausea without vomiting that have persisted and not clearly defined either.  We will need to consider gastric emptying issues for the patient if things persist.  A follow-up diagnostic endoscopy will be performed based on the patient's persistent symptoms.  The risks and benefits of endoscopic evaluation were discussed with the patient; these include but are not limited to the risk of perforation, infection, bleeding, missed lesions, lack of diagnosis, severe illness requiring hospitalization, as well as anesthesia and sedation related illnesses.  The patient is agreeable to proceed.  All patient questions were answered to the best of my ability, and the patient agrees to the aforementioned plan of action with follow-up as indicated.   PLAN  Laboratories as outlined below to be obtained Consider SIBO evaluation We will see how LFT pattern looks and consider role of liver biopsy Previously considered EUS to  ensure no microlithiasis and we may consider that in the future Phenergan as needed Scopolamine as needed Continue weight loss exercise and good blood pressure control are key to possibility of fatty liver disease but also for heart health Rule out alpha galactosidase deficiency Referral to [facility] weight management to be placed per patient desire I agree with the role of this although mental health optimization will be key prior to his any sort of bariatric interventions   Orders Placed This Encounter  Procedures  . CBC  . Comp Met (CMET)  . IBC + Ferritin  . INR/PT  . Alpha galactosidase  . Ambulatory referral to Gastroenterology  . Amb Ref to Medical Weight Management    New Prescriptions   NA SULFATE-K SULFATE-MG SULF (SUPREP BOWEL PREP KIT) 17.5-3.13-1.6 GM/177ML SOLN    Take 1 kit by mouth as directed. For colonoscopy prep   Modified Medications   Modified Medication Previous Medication   PROMETHAZINE (PHENERGAN) 25 MG TABLET promethazine (PHENERGAN) 25 MG tablet      Take 1 tablet (25 mg total) by mouth every 6 (six) hours as needed for nausea or vomiting.    Take 1 tablet (25 mg total) by mouth every 6 (six) hours as needed for nausea or vomiting.   SCOPOLAMINE (TRANSDERM-SCOP, 1.5 MG,) 1 MG/3DAYS scopolamine (TRANSDERM-SCOP, 1.5 MG,) 1 MG/3DAYS      PLACE 1 PATCH ONTO THE SKIN EVERY 3 DAYS.    PLACE 1 PATCH ONTO THE SKIN EVERY 3 DAYS.    Planned Follow Up No follow-ups on file.   Total Time  in [place] and in [place] for patient including independent/personal interpretation/review of prior testing, medical history, examination, medication adjustment, communicating results with the patient directly, and documentation with the EHR is 30 minutes.   Justice Britain, MD [facility] Office # [phone]

## 2019-12-25 LAB — ALPHA GALACTOSIDASE: Alpha-Galactosidase activity: 21.5 nmol/hr/mg prt — ABNORMAL LOW (ref >35.5)

## 2020-01-02 ENCOUNTER — Other Ambulatory Visit: Payer: Self-pay | Admitting: Gastroenterology

## 2020-01-02 LAB — SARS CORONAVIRUS 2 (TAT 6-24 HRS): SARS Coronavirus 2: NEGATIVE

## 2020-01-05 ENCOUNTER — Other Ambulatory Visit: Payer: Self-pay

## 2020-01-06 ENCOUNTER — Ambulatory Visit (AMBULATORY_SURGERY_CENTER): Payer: Medicaid Other | Admitting: Gastroenterology

## 2020-01-06 ENCOUNTER — Other Ambulatory Visit: Payer: Self-pay

## 2020-01-06 ENCOUNTER — Encounter: Payer: Self-pay | Admitting: Gastroenterology

## 2020-01-06 VITALS — BP 137/98 | HR 97 | Temp 96.8°F | Resp 19 | Ht 67.0 in | Wt 224.0 lb

## 2020-01-06 DIAGNOSIS — K2970 Gastritis, unspecified, without bleeding: Secondary | ICD-10-CM

## 2020-01-06 DIAGNOSIS — R110 Nausea: Secondary | ICD-10-CM

## 2020-01-06 DIAGNOSIS — R945 Abnormal results of liver function studies: Secondary | ICD-10-CM

## 2020-01-06 DIAGNOSIS — K219 Gastro-esophageal reflux disease without esophagitis: Secondary | ICD-10-CM

## 2020-01-06 DIAGNOSIS — K641 Second degree hemorrhoids: Secondary | ICD-10-CM | POA: Diagnosis not present

## 2020-01-06 DIAGNOSIS — K2950 Unspecified chronic gastritis without bleeding: Secondary | ICD-10-CM | POA: Diagnosis not present

## 2020-01-06 DIAGNOSIS — K921 Melena: Secondary | ICD-10-CM

## 2020-01-06 DIAGNOSIS — K625 Hemorrhage of anus and rectum: Secondary | ICD-10-CM

## 2020-01-06 DIAGNOSIS — K209 Esophagitis, unspecified without bleeding: Secondary | ICD-10-CM | POA: Diagnosis not present

## 2020-01-06 DIAGNOSIS — R7989 Other specified abnormal findings of blood chemistry: Secondary | ICD-10-CM

## 2020-01-06 MED ORDER — SODIUM CHLORIDE 0.9 % IV SOLN: 500.0000 mL | Freq: Once | INTRAVENOUS | Status: DC

## 2020-01-06 NOTE — Progress Notes (Signed)
Called to room to assist during endoscopic procedure.  Patient ID and intended procedure confirmed with present staff. Received instructions for my participation in the procedure from the performing physician.  

## 2020-01-06 NOTE — Op Note (Signed)
[place] [facility] Patient Name: Angie Brown Procedure Date: 01/06/2020 8:55 AM MRN: [id number] Endoscopist: Justice Britain , MD Age: 39 Referring MD:  Date of Birth: 06-10-1980 Gender: Female Account #: [id number] Procedure:                Colonoscopy Indications:              Hematochezia, Rectal bleeding Medicines:                Monitored Anesthesia Care Procedure:                Pre-Anesthesia Assessment:                           - Prior to the procedure, a History and Physical                            was performed, and patient medications and                            allergies were reviewed. The patient's tolerance of                            previous anesthesia was also reviewed. The risks                            and benefits of the procedure and the sedation                            options and risks were discussed with the patient.                            All questions were answered, and informed consent                            was obtained. Prior Anticoagulants: The patient has                            taken no previous anticoagulant or antiplatelet                            agents. ASA Grade Assessment: III - A patient with                            severe systemic disease. After reviewing the risks                            and benefits, the patient was deemed in                            satisfactory condition to undergo the procedure.                           After obtaining informed consent, the colonoscope  was passed under direct vision. Throughout the                            procedure, the patient's blood pressure, pulse, and                            oxygen saturations were monitored continuously. The                            Colonoscope was introduced through the anus and                            advanced to the the cecum, identified by                            appendiceal orifice and ileocecal  valve. The                            colonoscopy was performed without difficulty. The                            patient tolerated the procedure. The quality of the                            bowel preparation was good. The ileocecal valve,                            appendiceal orifice, and rectum were photographed. Scope In: 9:10:20 AM Scope Out: 9:23:23 AM Scope Withdrawal Time: 0 hours 9 minutes 11 seconds  Total Procedure Duration: 0 hours 13 minutes 3 seconds  Findings:                 The digital rectal exam findings include                            hemorrhoids. Pertinent negatives include no                            palpable rectal lesions.                           Normal mucosa was found in the entire colon.                           Non-bleeding non-thrombosed external and internal                            hemorrhoids were found during retroflexion, during                            perianal exam and during digital exam. The                            hemorrhoids were Grade II (internal hemorrhoids  that prolapse but reduce spontaneously). Complications:            No immediate complications. Estimated Blood Loss:     Estimated blood loss: none. Impression:               - Hemorrhoids found on digital rectal exam.                           - Normal mucosa in the entire examined colon.                           - Non-bleeding non-thrombosed external and internal                            hemorrhoids. Recommendation:           - The patient will be observed post-procedure,                            until all discharge criteria are met.                           - Discharge patient to home.                           - Patient has a contact number available for                            emergencies. The signs and symptoms of potential                            delayed complications were discussed with the                            patient.  Return to normal activities tomorrow.                            Written discharge instructions were provided to the                            patient.                           - High fiber diet.                           - Use FiberCon 1-2 tablets PO daily.                           - I will review endoscopic imaging with my partners                            who may be able to offer consideration of                            hemorrhoidal banding - though you have both  internal/external hemorrhoids and we may end up                            considering referral to colorectal surgery if                            necessary.                           - We must keep bowels moving nicely and so Miralax                            daily or every other day to allow soft stools is                            important.                           - Colace 200 mg daily can be helpful as well.                           - Repeat colonoscopy in 10 years for screening                            purposes.                           - The findings and recommendations were discussed                            with the patient. Justice Britain, MD 01/06/2020 9:35:33 AM

## 2020-01-06 NOTE — Op Note (Signed)
[facility] Patient Name: Angie Brown Procedure Date: 01/06/2020 8:55 AM MRN: [id number] Endoscopist: Corliss Parish , MD Age: 39 Referring MD:  Date of Birth: 1980-06-11 Gender: Female Account #: [id number] Procedure:                Upper GI endoscopy Indications:              Nausea with vomiting, Persistent vomiting of                            unknown cause, Personal history of peptic ulcer                            disease, Recent low Alpha-galactosidase enzyme                            activity (not clear true Red meat allergy) Medicines:                Monitored Anesthesia Care Procedure:                Pre-Anesthesia Assessment:                           - Prior to the procedure, a History and Physical                            was performed, and patient medications and                            allergies were reviewed. The patient's tolerance of                            previous anesthesia was also reviewed. The risks                            and benefits of the procedure and the sedation                            options and risks were discussed with the patient.                            All questions were answered, and informed consent                            was obtained. Prior Anticoagulants: The patient has                            taken no previous anticoagulant or antiplatelet                            agents. ASA Grade Assessment: III - A patient with                            severe systemic disease. After reviewing the risks  and benefits, the patient was deemed in                            satisfactory condition to undergo the procedure.                           After obtaining informed consent, the endoscope was                            passed under direct vision. Throughout the                            procedure, the patient's blood pressure, pulse, and                            oxygen saturations  were monitored continuously. The                            Endoscope was introduced through the mouth, and                            advanced to the second part of duodenum. The upper                            GI endoscopy was accomplished without difficulty.                            The patient tolerated the procedure. Scope In: Scope Out: Findings:                 No gross lesions were noted in the proximal                            esophagus and in the mid esophagus.                           Scattered islands of salmon-colored mucosa were                            present at 38 cm. No other visible abnormalities                            were present. Mucosa was biopsied with a cold                            forceps for histology to rule out Barrett's                            esophagus.                           Patchy mildly erythematous mucosa without bleeding                            was found in the gastric body  and in the gastric                            antrum.                           No other gross lesions were noted in the entire                            examined stomach. Biopsies were taken with a cold                            forceps for histology and Helicobacter pylori                            testing.                           No gross lesions were noted in the duodenal bulb,                            in the first portion of the duodenum and in the                            second portion of the duodenum - previously                            biopsied and negative. Complications:            No immediate complications. Estimated Blood Loss:     Estimated blood loss was minimal. Impression:               - No gross lesions in esophagus proximally.                            Salmon-colored mucosa suggestive of short-segment                            Barrett's esophagus. Biopsied.                           - Erythematous mucosa in the gastric body  and                            antrum. No other gross lesions in the stomach.                            Biopsied.                           - No gross lesions in the duodenal bulb, in the                            first portion of the duodenum and in the second  portion of the duodenum. Recommendation:           - Proceed to scheduled colonoscopy.                           - Await pathology results.                           - Observe patient's clinical course.                           - Continue current medications.                           - The findings and recommendations were discussed                            with the patient. Corliss Parish, MD 01/06/2020 9:31:45 AM

## 2020-01-06 NOTE — Progress Notes (Signed)
To PACU, VSS. Report to [email or web address].tb 

## 2020-01-06 NOTE — Patient Instructions (Signed)
Handouts provided on gastritis, hemorrhoids and high-fiber diet.   Follow a High-fiber diet.   Use FiberCon 1-2 tablets by mouth daily. (This is an over-the-counter medication.) We must keep bowels moving nicely and so Miralax daily or every other day to allow soft stools is important. Colace 200mg  daily can be helpful as well. (Both of these are over-the-counter medications.)  YOU HAD AN ENDOSCOPIC PROCEDURE TODAY AT THE [facility]:   Refer to the procedure report that was given to you for any specific questions about what was found during the examination.  If the procedure report does not answer your questions, please call your gastroenterologist to clarify.  If you requested that your care partner not be given the details of your procedure findings, then the procedure report has been included in a sealed envelope for you to review at your convenience later.  YOU SHOULD EXPECT: Some feelings of bloating in the abdomen. Passage of more gas than usual.  Walking can help get rid of the air that was put into your GI tract during the procedure and reduce the bloating. If you had a lower endoscopy (such as a colonoscopy or flexible sigmoidoscopy) you may notice spotting of blood in your stool or on the toilet paper. If you underwent a bowel prep for your procedure, you may not have a normal bowel movement for a few days.  Please Note:  You might notice some irritation and congestion in your nose or some drainage.  This is from the oxygen used during your procedure.  There is no need for concern and it should clear up in a day or so.  SYMPTOMS TO REPORT IMMEDIATELY:   Following lower endoscopy (colonoscopy or flexible sigmoidoscopy):  Excessive amounts of blood in the stool  Significant tenderness or worsening of abdominal pains  Swelling of the abdomen that is new, acute  Fever of 100F or higher   Following upper endoscopy (EGD)  Vomiting of blood or coffee ground material  New  chest pain or pain under the shoulder blades  Painful or persistently difficult swallowing  New shortness of breath  Fever of 100F or higher  Black, tarry-looking stools  For urgent or emergent issues, a gastroenterologist can be reached at any hour by calling [phone]. Do not use MyChart messaging for urgent concerns.    DIET:  We do recommend a small meal at first, but then you may proceed to your regular diet.  Drink plenty of fluids but you should avoid alcoholic beverages for 24 hours.  ACTIVITY:  You should plan to take it easy for the rest of today and you should NOT DRIVE or use heavy machinery until tomorrow (because of the sedation medicines used during the test).    FOLLOW UP: Our staff will call the number listed on your records 48-72 hours following your procedure to check on you and address any questions or concerns that you may have regarding the information given to you following your procedure. If we do not reach you, we will leave a message.  We will attempt to reach you two times.  During this call, we will ask if you have developed any symptoms of COVID 19. If you develop any symptoms (ie: fever, flu-like symptoms, shortness of breath, cough etc.) before then, please call [phone].  If you test positive for Covid 19 in the 2 weeks post procedure, please call and report this information to [phone].    If any biopsies were taken you will be  contacted by phone or by letter within the next 1-3 weeks.  Please call us at [phone] if you have not heard about the biopsies in 3 weeks.    SIGNATURES/CONFIDENTIALITY: You and/or your care partner have signed paperwork which will be entered into your electronic medical record.  These signatures attest to the fact that that the information above on your After Visit Summary has been reviewed and is understood.  Full responsibility of the confidentiality of this discharge information lies with you and/or your care-partner.

## 2020-01-08 ENCOUNTER — Other Ambulatory Visit: Payer: Self-pay

## 2020-01-08 ENCOUNTER — Telehealth: Payer: Self-pay

## 2020-01-08 MED ORDER — DICYCLOMINE HCL 10 MG PO CAPS: 10.0000 mg | ORAL_CAPSULE | Freq: Three times a day (TID) | ORAL | 3 refills | Status: DC

## 2020-01-08 NOTE — Telephone Encounter (Signed)
  Follow up Call-  Call back number 01/06/2020  Post procedure Call Back phone  # [phone]  Permission to leave phone message Yes  Some recent data might be hidden     Patient questions:  Do you have a fever, pain , or abdominal swelling? Yes Pain Score  3*  Patient states she has been having intermittent stomach cramping since having the procedures done.  She thinks it may be from the biopsies taken but says she used to take Bentyl for this type of cramping but no longer has a prescription available for this medication.  Have you tolerated food without any problems? Yes.    Have you been able to return to your normal activities? Yes.    Do you have any questions about your discharge instructions: Diet   No. Medications  No. Follow up visit  No.  Do you have questions or concerns about your Care? No.  Actions: * If pain score is 4 or above: No action needed, pain <4.  1. Have you developed a fever since your procedure? no  2.   Have you had an respiratory symptoms (SOB or cough) since your procedure? no  3.   Have you tested positive for COVID 19 since your procedure no  4.   Have you had any family members/close contacts diagnosed with the COVID 19 since your procedure?  no   If yes to any of these questions please route to Laverna Peace, RN and Karlton Lemon, RN

## 2020-01-08 NOTE — Telephone Encounter (Signed)
Thank you for the update. I am sorry that she is not feeling well. Angie Brown, since the patient has felt previously better when taking the Bentyl for cramping please send a new prescription for her with multiple refills. Please put her on the follow-up list to talk and see how she is doing tomorrow. Thanks. GM

## 2020-01-08 NOTE — Telephone Encounter (Signed)
Prescription sent will call pt tomorrow to get an update

## 2020-01-09 NOTE — Telephone Encounter (Signed)
The pt states that the Bentyl is working "amazing"  No current problems at this time and she will call if she has any recurrence of symptoms.

## 2020-01-09 NOTE — Telephone Encounter (Signed)
Great to hear. Thanks. GM

## 2020-01-21 ENCOUNTER — Encounter: Payer: Self-pay | Admitting: Gastroenterology

## 2020-03-09 ENCOUNTER — Ambulatory Visit: Payer: Medicaid Other

## 2020-03-16 ENCOUNTER — Encounter: Payer: Self-pay | Admitting: Physical Therapy

## 2020-03-16 ENCOUNTER — Other Ambulatory Visit: Payer: Self-pay

## 2020-03-16 ENCOUNTER — Ambulatory Visit: Payer: Medicaid Other | Attending: Student | Admitting: Physical Therapy

## 2020-03-16 DIAGNOSIS — G8929 Other chronic pain: Secondary | ICD-10-CM | POA: Diagnosis present

## 2020-03-16 DIAGNOSIS — M545 Low back pain, unspecified: Secondary | ICD-10-CM | POA: Diagnosis not present

## 2020-03-16 DIAGNOSIS — M62830 Muscle spasm of back: Secondary | ICD-10-CM | POA: Insufficient documentation

## 2020-03-16 DIAGNOSIS — M6281 Muscle weakness (generalized): Secondary | ICD-10-CM | POA: Diagnosis present

## 2020-03-16 DIAGNOSIS — R293 Abnormal posture: Secondary | ICD-10-CM | POA: Diagnosis present

## 2020-03-16 NOTE — Therapy (Addendum)
[facility] [address] [place], [place], [address] Phone: [phone]   Fax:  [phone]  Physical Therapy Evaluation  Patient Details  Name: Angie Brown MRN: [id number] Date of Birth: [birth year] Referring Provider (PT): Val Eagle D, NP   Encounter Date: 03/16/2020   PT End of Session - 03/16/20 1017    Visit Number 1    Number of Visits 13    Date for PT Re-Evaluation 05/11/20    Authorization Type [employer] MCD    PT Start Time 1016    PT Stop Time 1100    PT Time Calculation (min) 44 min    Activity Tolerance Patient tolerated treatment well    Behavior During Therapy [facility] for tasks assessed/performed           Past Medical History:  Diagnosis Date  . Allergy    Wasps  . Asthma   . Bronchitis   . Depression   . Dysrhythmia    PVCs- stress related  . Hypoglycemia   . Hypothyroidism   . PONV (postoperative nausea and vomiting)    pt prefers scop patch  . Renal disorder    2008    Past Surgical History:  Procedure Laterality Date  . ABDOMINAL HYSTERECTOMY    . BACK SURGERY  07/2009   spinal fusion L 5-S1  . BIOPSY  12/07/2018   Procedure: BIOPSY;  Surgeon: Meridee Score Netty Starring., MD;  Location: [facility];  Service: Gastroenterology;;  . BLADDER SUSPENSION N/A 03/11/2014   Procedure: TRANSVAGINAL TAPE (TVT) PROCEDURE;  Surgeon: Lavina Hamman, MD;  Location: [facility];  Service: Gynecology;  Laterality: N/A;  . CHOLECYSTECTOMY    . ESOPHAGOGASTRODUODENOSCOPY (EGD) WITH PROPOFOL N/A 12/07/2018   Procedure: ESOPHAGOGASTRODUODENOSCOPY (EGD) WITH PROPOFOL;  Surgeon: Meridee Score Netty Starring., MD;  Location: [facility];  Service: Gastroenterology;  Laterality: N/A;  . TONSILLECTOMY AND ADENOIDECTOMY      There were no vitals filed for this visit.    Subjective Assessment - 03/16/20 1020    Subjective pt is a 39 y.o F with CC of chronic low back pain that has been ongoing for over 10 years. She  reports having issues with her back going out for 6 months ago with no specific onset. When the back goes out it causes her to fall, and esitmates about 24 falls in the last 6 months . She reports having numbness int he LLE from the knee to her foot, she reports getting referred "lightening bolts" into both legs R>L. She reports numbness in the thighs in the groin since 2011, and denies any incontinence. or other red flags.    Pertinent History per pt report she had L5-S1 fusion in 2011.    Limitations Standing;Walking;Lifting;Sitting    How long can you sit comfortably? 20 min    How long can you stand comfortably? 15 min    How long can you walk comfortably? 15 min    Diagnostic tests nothing recent    Patient Stated Goals to decrease pain    Currently in Pain? Yes    Pain Score 10-Worst pain ever   Declined going to the hospital   Pain Location Back    Pain Orientation Left;Right    Pain Descriptors / Indicators Aching;Sore;Tightness;Sharp    Pain Type Chronic pain    Pain Radiating Towards bil LE into the ankle with L>R    Pain Onset More than a month ago    Pain Frequency Constant    Aggravating Factors  any activity, standing/ walking, moving around    Pain Relieving Factors pain medication, muscle relaxer. heat    Effect of Pain on Daily Activities limited postureal tolerance, limited endurance. limited sleeping tolerance.              [facility] - 03/16/20 1028      Assessment   Medical Diagnosis Low back pain, unspecified (M54.50)    Referring Provider (PT) Val EagleBergman, Meghan D, NP    Onset Date/Surgical Date --   10 years ago   Hand Dominance Right    Next MD Visit 03/18/2020    Prior Therapy yes      Precautions   Precautions None      Restrictions   Weight Bearing Restrictions No      Balance Screen   Has the patient fallen in the past 6 months Yes    How many times? 24   esitatmed   Has the patient had a decrease in activity level because of a fear of  falling?  Yes    Is the patient reluctant to leave their home because of a fear of falling?  Yes      Home Environment   Living Environment Private residence    Living Arrangements Children    Available Help at Discharge Family    Type of Home House    Home Access Stairs to enter    Entrance Stairs-Number of Steps 4    Entrance Stairs-Rails None    Home Layout --   split level 1 step down going into room   Home Equipment Walker - 2 wheels      Prior Function   Level of Independence Independent with basic ADLs    Vocation --   filing for disability     Cognition   Overall Cognitive Status Within Functional Limits for tasks assessed      Observation/Other Assessments   Focus on Therapeutic Outcomes (FOTO)  MCD      Posture/Postural Control   Posture/Postural Control Postural limitations    Postural Limitations Rounded Shoulders;Forward head;Decreased lumbar lordosis      ROM / Strength   AROM / PROM / Strength AROM;Strength      AROM   Overall AROM Comments pt demonstrated RLE buckling when coming back to a neutral position.    AROM Assessment Site Lumbar    Lumbar Flexion 38   reproduced concordant pain   Lumbar Extension 8   pop going to end range   Lumbar - Right Side Bend 8    Lumbar - Left Side Bend 15      Strength   Overall Strength Comments limited effort given during strength assessment    Strength Assessment Site Hip;Knee    Right/Left Hip Right;Left    Right Hip Flexion 3/5    Right Hip ABduction 3/5    Left Hip Flexion 3/5    Left Hip ABduction 3-/5    Right/Left Knee Right;Left    Right Knee Flexion 3/5    Right Knee Extension 3/5    Left Knee Flexion 3/5    Left Knee Extension 3/5      Palpation   Palpation comment TTP along the bil lumbar paraspinals with guarding noted                      Objective measurements completed on examination: See above findings.  PT Short Term Goals - 03/16/20 1114      PT  SHORT TERM GOAL #1   Title pt to be IND with inital HEP    Baseline no previous HEP    Time 3    Period Weeks    Status New    Target Date 04/13/20      PT SHORT TERM GOAL #2   Title pt to verbalize and demo efficent posture and lifting mechanics to reduce and prevent low back pain    Baseline no previous knowledge of posture    Time 3    Period Weeks    Status New    Target Date 04/13/20      PT SHORT TERM GOAL #3   Title reduce lumbar paraspinal spasm to reduce pain to </= 8/10 and promote trunk mobility    Baseline currently 10/10 and limited mobility due to muscle spasm in the back.    Time 3    Period Weeks    Status New    Target Date 04/13/20      PT SHORT TERM GOAL #4   Title to assess berg balance and update LTG's    Time 1    Period Weeks    Status New    Target Date 03/30/20             PT Long Term Goals - 03/16/20 1116      PT LONG TERM GOAL #1   Title increase trunk flexion and bil sidebending by >/= 10 degrees with </= 5/10 max pain for funcitonal mobility required for ADLS    Baseline see flow sheet pain 10/10 today    Time 6    Period Weeks    Status New    Target Date 05/11/20      PT LONG TERM GOAL #2   Title increase gross LE strength to >/= 4/5 to promote hip/ knee stability with standing/ walking    Baseline see flow sheet    Time 6    Period Weeks    Status New    Target Date 05/11/20      PT LONG TERM GOAL #3   Title pt to be able sit, stand and walk for >/= 30 min with </= 5/10 max pain for functional endurance for community ambulatin and in home ADLs    Baseline seated 20 min, standing/ walking 15 min max of 10/10 pain    Time 6    Period Weeks    Status New    Target Date 05/11/20      PT LONG TERM GOAL #4   Title pt to report no falls for >/= 2 weeks for improvement in balance and safety and  QOL.    Baseline --    Time 6    Period Weeks    Status New    Target Date 05/11/20      PT LONG TERM GOAL #5   Title pt to be  IND with all HEP given and is able to maintain and progress current LOF IND.    Baseline no previous HEP    Time 6    Period Weeks    Status New    Target Date 05/11/20                  Plan - 03/16/20 1103    Clinical Impression Statement pt presents to OPPT with chronic low back pain over 10 years ago. She reports hx or lumbar fusion  at L5-S1 in 2011. limited assessment due to high pain report and irritability .She demonstates limited trunk mobility secondary to pain and guarding noting reproduction of concordant symptoms with flexion and demonstrated RLE instability with returning to neural. Gross weakness in bil LE with cues for direction / assessment. She would benefit from physical therapy to decrease low back pain, increase trunk mobility, improve gross hip / LE strength and maximize her function by addressing the deficits listed.    Personal Factors and Comorbidities Comorbidity 3+    Comorbidities hx of depression, asthma, chronic pain management    Examination-Activity Limitations Lift;Locomotion Level;Stand;Stairs;Bend;Carry;Sleep;Squat    Stability/Clinical Decision Making Unstable/Unpredictable    Clinical Decision Making High    Rehab Potential Fair    PT Frequency 2x / week   initial MCD auth 1 x aweek for 3 weeks.   PT Duration 6 weeks    PT Treatment/Interventions ADLs/Self Care Home Management;Cryotherapy;Electrical Stimulation;Iontophoresis /ml Dexamethasone;Moist Heat;Traction;Ultrasound;Stair training;Gait training;Therapeutic exercise;Therapeutic activities;Balance training;Neuromuscular re-education;Manual techniques;Dry needling;Patient/family education;Taping    PT Next Visit Plan review/ update HEP PRN, assess BERG balance vs tug, gross hip/ core strengthening, high irritability, STW for lumbar paraspinals, modalities for pain    PT Home Exercise Plan [id number] - LTR, seated ab set, hooklying glute set, posterior pelvic tilt, supine marching,    Consulted and  Agree with Plan of Care Patient           Patient will benefit from skilled therapeutic intervention in order to improve the following deficits and impairments:  Abnormal gait,Improper body mechanics,Pain,Postural dysfunction,Increased muscle spasms,Decreased strength,Decreased mobility,Decreased activity tolerance,Decreased endurance,Decreased range of motion  Visit Diagnosis: Chronic bilateral low back pain, unspecified whether sciatica present  Muscle spasm of back  Abnormal posture  Muscle weakness (generalized)     Problem List Patient Active Problem List   Diagnosis Date Noted  . BRBPR (bright red blood per rectum) 12/22/2019  . Nausea without vomiting 12/22/2019  . Hemorrhoids 12/22/2019  . Change in bowel function 01/12/2019  . Fatty liver 01/12/2019  . Gastritis and gastroduodenitis 01/12/2019  . Abnormal LFTs 01/12/2019  . Abdominal pain, right upper quadrant 01/12/2019  . Non-intractable vomiting   . NASH (nonalcoholic steatohepatitis)   . Hepatitis 12/03/2018  . Transaminitis 12/03/2018  . 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 Abdominal pain 06/08/2017  . Chronic constipation 02/14/2017  . Asthma 01/26/2017  . Anaphylaxis 09/29/2016  . Elevated glucose 09/29/2016  . Chronic pain 06/29/2016  . 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 Voiding dysfunction 03/08/2016  . Other insomnia 03/01/2016  . Elevated blood pressure reading 01/05/2016  . GAD (generalized anxiety disorder) 01/05/2016  . Gastroesophageal reflux disease without esophagitis 01/05/2016  . Recurrent major depressive disorder (HCC) 01/05/2016  . Stress incontinence in female 03/11/2014  . HYPOTENSION 03/18/2010  . ARM PAIN 04/28/2009  . ABNORMAL INVOLUNTARY MOVEMENTS 04/28/2009  . FACIAL PARESTHESIA 04/28/2009  . HEADACHE 04/26/2009  . WHEEZING 03/23/2009  . DIARRHEA, ACUTE, CHRONIC 02/19/2009  . HYDRONEPHROSIS, RIGHT 02/18/2009  . PYELONEPHRITIS, HX OF 02/18/2009   . MIGRAINES, HX OF 02/18/2009  . LUMBAR RADICULOPATHY, RIGHT 12/23/2008  . ASTHMATIC BRONCHITIS, ACUTE 01/22/2008  . BACK PAIN 01/22/2008  . ELEVATED BLOOD PRESSURE WITHOUT DIAGNOSIS OF HYPERTENSION 01/22/2008  . OBESITY, MORBID 05/29/2007  . INSOMNIA-SLEEP DISORDER-UNSPEC 05/29/2007  . HYPERSOMNIA 05/29/2007  . ANEMIA-NOS 01/01/2007  . ANXIETY 01/01/2007  . DEPRESSION 01/01/2007  . COMMON MIGRAINE 01/01/2007  . ASTHMA 01/01/2007  . SYNCOPE 01/01/2007   Hugh Kamara PT, DPT, LAT, ATC  03/16/20  11:30 AM      [facility] [address] [place], [place], [address] Phone: [phone]   Fax:  [phone]  Name: SUSI GOSLIN MRN: [id number] Date of Birth: 02-18-1981     [facility] Authorization   Choose one: Rehabilitative  Standardized Assessment or Functional Outcome Tool: See Pain Assessment  Score or Percent Disability: pain 10/10  Body Parts Treated (Select each separately):  1. Lumbopelvic. Overall deficits/functional limitations for body part selected: severe

## 2020-03-17 ENCOUNTER — Other Ambulatory Visit: Payer: Self-pay | Admitting: Gastroenterology

## 2020-04-07 ENCOUNTER — Ambulatory Visit: Payer: Medicaid Other | Admitting: Physical Therapy

## 2020-04-10 ENCOUNTER — Encounter (INDEPENDENT_AMBULATORY_CARE_PROVIDER_SITE_OTHER): Payer: Self-pay

## 2020-04-14 ENCOUNTER — Ambulatory Visit: Payer: Medicaid Other | Admitting: Physical Therapy

## 2020-04-21 ENCOUNTER — Ambulatory Visit: Payer: Self-pay | Admitting: Physical Therapy

## 2020-04-27 ENCOUNTER — Other Ambulatory Visit: Payer: Self-pay

## 2020-04-27 ENCOUNTER — Ambulatory Visit: Payer: Self-pay | Admitting: Physical Therapy

## 2020-04-27 ENCOUNTER — Ambulatory Visit
Admission: EM | Admit: 2020-04-27 | Discharge: 2020-04-27 | Disposition: A | Discharge status: Home / Self Care | Payer: Medicaid Other | Attending: Internal Medicine | Admitting: Internal Medicine

## 2020-04-27 DIAGNOSIS — J209 Acute bronchitis, unspecified: Secondary | ICD-10-CM | POA: Diagnosis not present

## 2020-04-27 DIAGNOSIS — Z20822 Contact with and (suspected) exposure to covid-19: Secondary | ICD-10-CM

## 2020-04-27 DIAGNOSIS — R21 Rash and other nonspecific skin eruption: Secondary | ICD-10-CM | POA: Diagnosis not present

## 2020-04-27 MED ORDER — AZITHROMYCIN 250 MG PO TABS: 250.0000 mg | ORAL_TABLET | Freq: Every day | ORAL | 0 refills | Status: DC

## 2020-04-27 MED ORDER — MUPIROCIN CALCIUM 2 % EX CREA: 1.0000 "application " | TOPICAL_CREAM | Freq: Two times a day (BID) | CUTANEOUS | 0 refills | Status: DC

## 2020-04-27 MED ORDER — BENZONATATE 200 MG PO CAPS: 200.0000 mg | ORAL_CAPSULE | Freq: Two times a day (BID) | ORAL | 0 refills | Status: DC | PRN

## 2020-04-27 NOTE — Discharge Instructions (Signed)
Follow up with [facility] ones your family Dr refers you to them

## 2020-04-27 NOTE — ED Triage Notes (Signed)
Pt c/o sore throat, cough, headache, and chest congestion x 3days. Pt also c/o a soft swollen area to lt knee with no injury. States had positive covid exposure.

## 2020-04-27 NOTE — ED Provider Notes (Signed)
[facility]    CSN: [id number] Arrival date & time: 04/27/20  1612      History   Chief Complaint Chief Complaint  Patient presents with  . Cough    HPI Angie Brown is a 40 y.o. female presents with 2 problems 1- pt woke with swelling on R medial thigh. Was painful this am, and has been applying icy hot patches this am. Denies an injury  2- URI onset  X 3 days. Had fever of 100.5 2 days ago. Has been chilling and having sweats.  Covid test at home was negative  Her cough is non productive Has been having coughing fits. Her pulse ox has droped to 95%. Had URI at the end of January. Has hx of asthamtic  bronchitis. Uses proair and has not used her nebs. She is a smoker. Had covid in January this year.   3- itching on face have where she gets a bump and she has been picking at them. Some of them look like pimples, the neck ones have  Drained. Ran out of her Bactroban and would like a refill since she tends to get impetigo.  Past Medical History:  Diagnosis Date  . Allergy    Wasps  . Asthma   . Bronchitis   . Depression   . Dysrhythmia    PVCs- stress related  . Hypoglycemia   . Hypothyroidism   . PONV (postoperative nausea and vomiting)    pt prefers scop patch  . Renal disorder    2008    Patient Active Problem List   Diagnosis Date Noted  . BRBPR (bright red blood per rectum) 12/22/2019  . Nausea without vomiting 12/22/2019  . Hemorrhoids 12/22/2019  . Change in bowel function 01/12/2019  . Fatty liver 01/12/2019  . Gastritis and gastroduodenitis 01/12/2019  . Abnormal LFTs 01/12/2019  . Abdominal pain, right upper quadrant 01/12/2019  . Non-intractable vomiting   . NASH (nonalcoholic steatohepatitis)   . Hepatitis 12/03/2018  . Transaminitis 12/03/2018  . 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 Abdominal pain 06/08/2017  . Chronic constipation 02/14/2017  . Asthma 01/26/2017  . Anaphylaxis 09/29/2016  . Elevated glucose  09/29/2016  . Chronic pain 06/29/2016  . 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 Voiding dysfunction 03/08/2016  . Other insomnia 03/01/2016  . Elevated blood pressure reading 01/05/2016  . GAD (generalized anxiety disorder) 01/05/2016  . Gastroesophageal reflux disease without esophagitis 01/05/2016  . Recurrent major depressive disorder (HCC) 01/05/2016  . Stress incontinence in female 03/11/2014  . HYPOTENSION 03/18/2010  . ARM PAIN 04/28/2009  . ABNORMAL INVOLUNTARY MOVEMENTS 04/28/2009  . FACIAL PARESTHESIA 04/28/2009  . HEADACHE 04/26/2009  . WHEEZING 03/23/2009  . DIARRHEA, ACUTE, CHRONIC 02/19/2009  . HYDRONEPHROSIS, RIGHT 02/18/2009  . PYELONEPHRITIS, HX OF 02/18/2009  . MIGRAINES, HX OF 02/18/2009  . LUMBAR RADICULOPATHY, RIGHT 12/23/2008  . ASTHMATIC BRONCHITIS, ACUTE 01/22/2008  . BACK PAIN 01/22/2008  . ELEVATED BLOOD PRESSURE WITHOUT DIAGNOSIS OF HYPERTENSION 01/22/2008  . OBESITY, MORBID 05/29/2007  . INSOMNIA-SLEEP DISORDER-UNSPEC 05/29/2007  . HYPERSOMNIA 05/29/2007  . ANEMIA-NOS 01/01/2007  . ANXIETY 01/01/2007  . DEPRESSION 01/01/2007  . COMMON MIGRAINE 01/01/2007  . ASTHMA 01/01/2007  . SYNCOPE 01/01/2007    Past Surgical History:  Procedure Laterality Date  . ABDOMINAL HYSTERECTOMY    . BACK SURGERY  07/2009   spinal fusion L 5-S1  . BIOPSY  12/07/2018   Procedure: BIOPSY;  Surgeon: Meridee Score Netty Starring., MD;  Location: [facility];  Service: Gastroenterology;;  . BLADDER SUSPENSION N/A 03/11/2014   Procedure: TRANSVAGINAL TAPE (TVT) PROCEDURE;  Surgeon: Lavina Hammanodd Meisinger, MD;  Location: [facility];  Service: Gynecology;  Laterality: N/A;  . CHOLECYSTECTOMY    . ESOPHAGOGASTRODUODENOSCOPY (EGD) WITH PROPOFOL N/A 12/07/2018   Procedure: ESOPHAGOGASTRODUODENOSCOPY (EGD) WITH PROPOFOL;  Surgeon: Meridee ScoreMansouraty, Netty StarringGabriel Jr., MD;  Location: [facility];  Service: Gastroenterology;  Laterality: N/A;  . TONSILLECTOMY AND ADENOIDECTOMY      OB History    No obstetric history on file.      Home Medications    Prior to Admission medications   Medication Sig Start Date End Date Taking? Authorizing Provider  azithromycin (ZITHROMAX) 250 MG tablet Take 1 tablet (250 mg total) by mouth daily. Take first 2 tablets together, then 1 every day until finished. 04/27/20  Yes Rodriguez-Southworth, Nettie ElmSylvia, PA-C  benzonatate (TESSALON) 200 MG capsule Take 1 capsule (200 mg total) by mouth 2 (two) times daily as needed for cough. 04/27/20  Yes Rodriguez-Southworth, Nettie ElmSylvia, PA-C  mupirocin cream (BACTROBAN) 2 % Apply 1 application topically 2 (two) times daily. 04/27/20  Yes Rodriguez-Southworth, Nettie ElmSylvia, PA-C  albuterol (PROVENTIL) (2.5 MG/3ML) 0.083% nebulizer solution Take 2.5 mg by nebulization every 6 (six) hours as needed for wheezing or shortness of breath.  04/03/19   [provider]  albuterol (VENTOLIN HFA) 108 (90 Base) MCG/ACT inhaler Inhale 1-2 puffs into the lungs every 6 (six) hours as needed for wheezing or shortness of breath. 04/01/19   Hall-Potvin, [place], PA-C  carvedilol (COREG) 6.25 MG tablet Take 6.25 mg by mouth every evening.    [provider]  clonazePAM (KLONOPIN) 1 MG tablet Take 1 mg by mouth 2 (two) times daily. 06/18/18   [provider]  cyclobenzaprine (FLEXERIL) 10 MG tablet Take 10 mg by mouth at bedtime.    [provider]  dicyclomine (BENTYL) 10 MG capsule Take 1 capsule (10 mg total) by mouth 4 (four) times daily -  before meals and at bedtime. 01/08/20   Mansouraty, Netty StarringGabriel Jr., MD  diphenhydrAMINE (BENADRYL) 25 mg capsule Take 25 mg by mouth at bedtime as needed for allergies.     [provider]  EPIPEN 2-PAK 0.3 MG/0.3ML SOAJ injection Inject 0.3 mLs (0.3 mg total) into the muscle as needed (for allergic reaction). 10/13/18   Rolly SalterPatel, Pranav M, MD  fluticasone (FLONASE) 50 MCG/ACT nasal spray Place 1 spray into both nostrils at bedtime.    [provider]  folic acid  (FOLVITE) 1 MG tablet Take 1 mg by mouth daily. 09/09/19   [provider]  gabapentin (NEURONTIN) 300 MG capsule Take 900 mg by mouth at bedtime.    [provider]  levothyroxine (SYNTHROID) 50 MCG tablet Take 50 mcg by mouth every evening.     [provider]  meloxicam (MOBIC) 15 MG tablet Take 15 mg by mouth at bedtime.  08/26/19   [provider]  montelukast (SINGULAIR) 10 MG tablet Take 10 mg by mouth at bedtime.    [provider]  omeprazole (PRILOSEC) 40 MG capsule Take 1 capsule (40 mg total) by mouth 2 (two) times daily at 8 am and 10 pm. 01/09/19   Mansouraty, Netty StarringGabriel Jr., MD  oxyCODONE-acetaminophen (PERCOCET) 10-325 MG tablet Take 1 tablet by mouth every 8 (eight) hours as needed for pain. Patient not taking: Reported on 03/16/2020    [provider]  promethazine (PHENERGAN) 25 MG tablet Take 1 tablet (25 mg total) by mouth every 6 (six) hours as needed  for nausea or vomiting. 09/19/19   Robinson, [place] N, PA-C  promethazine (PHENERGAN) 25 MG tablet TAKE 1 TABLET BY MOUTH EVERY 6 HOURS AS NEEDED FOR NAUSEA OR VOMITING. 03/17/20   Mansouraty, Netty Starring., MD  scopolamine (TRANSDERM-SCOP, 1.5 MG,) 1 MG/3DAYS PLACE 1 PATCH ONTO THE SKIN EVERY 3 DAYS. 12/19/19   Mansouraty, Netty Starring., MD  Vitamin D, Ergocalciferol, (DRISDOL) 1.25 MG (50000 UNIT) CAPS capsule Take 50,000 Units by mouth once a week. 09/09/19   [provider]  vortioxetine HBr (TRINTELLIX) 10 MG TABS tablet Take 10 mg by mouth daily.    [provider]    Family History Family History  Problem Relation Age of Onset  . Liver disease Mother        Fatty liver, NASH  . GI Bleed Mother   . Hypothyroidism Mother   . Hypothyroidism Sister   . Liver disease Sister        fatty liver  . Liver cancer Maternal Grandfather   . Liver disease Maternal Grandfather        fatty liver  . Hypothyroidism Sister   . Liver disease Sister        fatty liver  .  Thyroid cancer Sister   . Colon cancer Neg Hx   . Esophageal cancer Neg Hx   . Rectal cancer Neg Hx   . Inflammatory bowel disease Neg Hx   . Pancreatic cancer Neg Hx   . Stomach cancer Neg Hx     Social History Social History   Tobacco Use  . Smoking status: Current Every Day Smoker    Packs/day: 0.50    Types: Cigarettes  . Smokeless tobacco: Never Used  Vaping Use  . Vaping Use: Some days  . Substances: Nicotine, Flavoring  Substance Use Topics  . Alcohol use: Not Currently  . Drug use: No     Allergies   Loratadine, Wasp venom, Sumatriptan, Doxycycline, Ibuprofen, and Methocarbamol   Review of Systems Review of Systems  Constitutional: Positive for appetite change, chills, diaphoresis, fatigue and fever.  HENT: Positive for congestion and postnasal drip. Negative for ear discharge, ear pain, rhinorrhea and sore throat.   Respiratory: Positive for cough, chest tightness and wheezing. Negative for shortness of breath.   Gastrointestinal: Positive for nausea. Negative for diarrhea and vomiting.  Musculoskeletal: Positive for joint swelling and myalgias. Negative for gait problem.  Skin: Positive for wound. Negative for rash.  Neurological: Negative for headaches.  Hematological: Negative for adenopathy.     Physical Exam Triage Vital Signs ED Triage Vitals  Enc Vitals Group     BP 04/27/20 1651 (!) 158/116     Pulse Rate 04/27/20 1651 97     Resp 04/27/20 1651 18     Temp 04/27/20 1651 98 F (36.7 C)     Temp Source 04/27/20 1651 Oral     SpO2 04/27/20 1651 98 %     Weight --      Height --      Head Circumference --      Peak Flow --      Pain Score 04/27/20 1652 5     Pain Loc --      Pain Edu? --      Excl. in GC? --    No data found.  Updated Vital Signs BP (!) 158/116 (BP Location: Left Arm)   Pulse 97   Temp 98 F (36.7 C) (Oral)   Resp 18   SpO2 98%  Visual Acuity Right Eye Distance:   Left Eye Distance:   Bilateral Distance:     Right Eye Near:   Left Eye Near:    Bilateral Near:     Physical Exam  Physical Exam Vitals signs and nursing note reviewed.  Constitutional:      General: She is not in acute distress.    Appearance: Normal appearance. She is not ill-appearing, toxic-appearing or diaphoretic.  HENT:     Head: Normocephalic.     Right Ear: Tympanic membrane, ear canal and external ear normal.     Left Ear: Tympanic membrane, ear canal and external ear normal.     Nose: Nose normal.     Mouth/Throat:     Mouth: Mucous membranes are moist.  Eyes:     General: No scleral icterus.       Right eye: No discharge.        Left eye: No discharge.     Conjunctiva/sclera: Conjunctivae normal.  Neck:     Musculoskeletal: Neck supple. No neck rigidity.  Cardiovascular:     Rate and Rhythm: Normal rate and regular rhythm.     Heart sounds: No murmur.  Pulmonary:     Effort: Pulmonary effort is normal.     Breath sounds: Normal breath sounds.   Musculoskeletal: Normal range of motion. Has soft mildly tender mass at the end of her medial quad muscle/ suprapatellar region. Is not hot or red.  Lymphadenopathy:     Cervical: No cervical adenopathy.  Skin:    General: Skin is warm and dry.     Coloration: Skin is not jaundiced.     Findings: has multiple scabs on her face. None of them have honey crusting  Neurological:     Mental Status: She is alert and oriented to person, place, and time.     Gait: Gait normal.  Psychiatric:        Mood and Affect: Mood normal.        Behavior: Behavior normal.        Thought Content: Thought content normal.        Judgment: Judgment normal.    UC Treatments / Results  Labs (all labs ordered are listed, but only abnormal results are displayed) Labs Reviewed  NOVEL CORONAVIRUS, NAA    EKG   Radiology No results found.  Procedures Procedures (including critical care time)  Medications Ordered in UC Medications - No data to display  Initial  Impression / Assessment and Plan / UC Course  I have reviewed the triage vital signs and the nursing notes. I placed her on Zpak, I refilled her Bactroban, and sent Tessalon Perles as noted. See instructions.   Final Clinical Impressions(s) / UC Diagnoses   Final diagnoses:  Encounter for screening laboratory testing for COVID-19 virus  Rash and nonspecific skin eruption  Suspected COVID-19 virus infection  Acute bronchitis, unspecified organism     Discharge Instructions     Follow up with [facility] ones your family Dr refers you to them    ED Prescriptions    Medication Sig Dispense Auth. Provider   azithromycin (ZITHROMAX) 250 MG tablet Take 1 tablet (250 mg total) by mouth daily. Take first 2 tablets together, then 1 every day until finished. 6 tablet Rodriguez-Southworth, Kierstan Auer, PA-C   benzonatate (TESSALON) 200 MG capsule Take 1 capsule (200 mg total) by mouth 2 (two) times daily as needed for cough. 30 capsule Rodriguez-Southworth, Korey Prashad, PA-C   mupirocin cream (BACTROBAN) 2 %  Apply 1 application topically 2 (two) times daily. 30 g Rodriguez-Southworth, Nettie Elm, PA-C     PDMP not reviewed this encounter.   Garey Ham, Cordelia Poche 04/27/20 2218

## 2020-04-28 LAB — SARS-COV-2, NAA 2 DAY TAT

## 2020-04-28 LAB — NOVEL CORONAVIRUS, NAA: SARS-CoV-2, NAA: NOT DETECTED

## 2020-04-29 ENCOUNTER — Encounter: Payer: Medicaid Other | Admitting: Physical Therapy

## 2020-05-04 ENCOUNTER — Encounter: Payer: Medicaid Other | Admitting: Physical Therapy

## 2020-05-06 ENCOUNTER — Encounter: Payer: Medicaid Other | Admitting: Physical Therapy

## 2020-05-11 ENCOUNTER — Ambulatory Visit: Payer: Medicaid Other | Admitting: Physical Therapy

## 2020-05-13 ENCOUNTER — Ambulatory Visit: Payer: Self-pay | Admitting: Physical Therapy

## 2020-05-14 DIAGNOSIS — F331 Major depressive disorder, recurrent, moderate: Secondary | ICD-10-CM | POA: Insufficient documentation

## 2020-05-15 ENCOUNTER — Other Ambulatory Visit: Payer: Self-pay | Admitting: Gastroenterology

## 2020-05-15 DIAGNOSIS — R110 Nausea: Secondary | ICD-10-CM

## 2020-05-17 NOTE — Telephone Encounter (Signed)
Okay to refill Scopolamine?

## 2020-05-20 ENCOUNTER — Emergency Department (HOSPITAL_COMMUNITY): Payer: Medicaid Other

## 2020-05-20 ENCOUNTER — Emergency Department (HOSPITAL_COMMUNITY)
Admission: EM | Admit: 2020-05-20 | Discharge: 2020-05-20 | Disposition: A | Discharge status: Home / Self Care | Payer: Medicaid Other | Attending: Emergency Medicine | Admitting: Emergency Medicine

## 2020-05-20 ENCOUNTER — Other Ambulatory Visit: Payer: Self-pay

## 2020-05-20 DIAGNOSIS — I10 Essential (primary) hypertension: Secondary | ICD-10-CM | POA: Diagnosis not present

## 2020-05-20 DIAGNOSIS — F17210 Nicotine dependence, cigarettes, uncomplicated: Secondary | ICD-10-CM | POA: Diagnosis not present

## 2020-05-20 DIAGNOSIS — Z7951 Long term (current) use of inhaled steroids: Secondary | ICD-10-CM | POA: Diagnosis not present

## 2020-05-20 DIAGNOSIS — Z79899 Other long term (current) drug therapy: Secondary | ICD-10-CM | POA: Diagnosis not present

## 2020-05-20 DIAGNOSIS — M79651 Pain in right thigh: Secondary | ICD-10-CM | POA: Insufficient documentation

## 2020-05-20 DIAGNOSIS — E039 Hypothyroidism, unspecified: Secondary | ICD-10-CM | POA: Insufficient documentation

## 2020-05-20 DIAGNOSIS — N390 Urinary tract infection, site not specified: Secondary | ICD-10-CM | POA: Diagnosis not present

## 2020-05-20 DIAGNOSIS — R55 Syncope and collapse: Secondary | ICD-10-CM | POA: Insufficient documentation

## 2020-05-20 DIAGNOSIS — M25511 Pain in right shoulder: Secondary | ICD-10-CM | POA: Diagnosis not present

## 2020-05-20 DIAGNOSIS — J45909 Unspecified asthma, uncomplicated: Secondary | ICD-10-CM | POA: Insufficient documentation

## 2020-05-20 DIAGNOSIS — R4182 Altered mental status, unspecified: Secondary | ICD-10-CM | POA: Diagnosis present

## 2020-05-20 DIAGNOSIS — W19XXXA Unspecified fall, initial encounter: Secondary | ICD-10-CM

## 2020-05-20 LAB — URINALYSIS, ROUTINE W REFLEX MICROSCOPIC
Bilirubin Urine: NEGATIVE
Glucose, UA: NEGATIVE mg/dL
Hgb urine dipstick: NEGATIVE
Ketones, ur: NEGATIVE mg/dL
Nitrite: POSITIVE — AB
Protein, ur: NEGATIVE mg/dL
Specific Gravity, Urine: 1.012 (ref 1.005–1.030)
WBC, UA: >50 WBC/hpf — ABNORMAL HIGH (ref 0–5)
pH: 6 (ref 5.0–8.0)

## 2020-05-20 LAB — BASIC METABOLIC PANEL
Anion gap: 13 (ref 5–15)
BUN: 6 mg/dL (ref 6–20)
CO2: 25 mmol/L (ref 22–32)
Calcium: 9 mg/dL (ref 8.9–10.3)
Chloride: 101 mmol/L (ref 98–111)
Creatinine, Ser: 0.93 mg/dL (ref 0.44–1.00)
GFR, Estimated: >60 mL/min (ref >60)
Glucose, Bld: 108 mg/dL — ABNORMAL HIGH (ref 70–99)
Potassium: 3.4 mmol/L — ABNORMAL LOW (ref 3.5–5.1)
Sodium: 139 mmol/L (ref 135–145)

## 2020-05-20 LAB — CBC WITH DIFFERENTIAL/PLATELET
Abs Immature Granulocytes: 0.02 10*3/uL (ref 0.00–0.07)
Basophils Absolute: 0 10*3/uL (ref 0.0–0.1)
Basophils Relative: 1 %
Eosinophils Absolute: 0.2 10*3/uL (ref 0.0–0.5)
Eosinophils Relative: 3 %
HCT: 40 % (ref 36.0–46.0)
Hemoglobin: 13.4 g/dL (ref 12.0–15.0)
Immature Granulocytes: 0 %
Lymphocytes Relative: 30 %
Lymphs Abs: 1.9 10*3/uL (ref 0.7–4.0)
MCH: 29.7 pg (ref 26.0–34.0)
MCHC: 33.5 g/dL (ref 30.0–36.0)
MCV: 88.7 fL (ref 80.0–100.0)
Monocytes Absolute: 0.3 10*3/uL (ref 0.1–1.0)
Monocytes Relative: 6 %
Neutro Abs: 3.8 10*3/uL (ref 1.7–7.7)
Neutrophils Relative %: 60 %
Platelets: 270 10*3/uL (ref 150–400)
RBC: 4.51 MIL/uL (ref 3.87–5.11)
RDW: 12.2 % (ref 11.5–15.5)
WBC: 6.2 10*3/uL (ref 4.0–10.5)
nRBC: 0 % (ref 0.0–0.2)

## 2020-05-20 LAB — CBG MONITORING, ED: Glucose-Capillary: 104 mg/dL — ABNORMAL HIGH (ref 70–99)

## 2020-05-20 LAB — RAPID URINE DRUG SCREEN, HOSP PERFORMED
Amphetamines: NOT DETECTED
Barbiturates: NOT DETECTED
Benzodiazepines: POSITIVE — AB
Cocaine: NOT DETECTED
Opiates: POSITIVE — AB
Tetrahydrocannabinol: POSITIVE — AB

## 2020-05-20 LAB — TSH: TSH: 0.572 u[IU]/mL (ref 0.350–4.500)

## 2020-05-20 MED ORDER — MORPHINE SULFATE (PF) 4 MG/ML IV SOLN
4.0000 mg | Freq: Once | INTRAVENOUS | Status: AC
Start: 2020-05-20 — End: 2020-05-20
  Administered 2020-05-20: 4 mg via INTRAVENOUS
  Filled 2020-05-20: qty 1

## 2020-05-20 MED ORDER — PROMETHAZINE HCL 25 MG/ML IJ SOLN
25.0000 mg | Freq: Once | INTRAMUSCULAR | Status: AC
  Administered 2020-05-20: 25 mg via INTRAVENOUS
  Filled 2020-05-20: qty 1

## 2020-05-20 MED ORDER — SULFAMETHOXAZOLE-TRIMETHOPRIM 800-160 MG PO TABS: 1.0000 | ORAL_TABLET | Freq: Two times a day (BID) | ORAL | 0 refills | Status: AC

## 2020-05-20 MED ORDER — CEPHALEXIN 500 MG PO CAPS: 500.0000 mg | ORAL_CAPSULE | Freq: Four times a day (QID) | ORAL | 0 refills | Status: DC

## 2020-05-20 MED ORDER — PROMETHAZINE HCL 25 MG PO TABS: 25.0000 mg | ORAL_TABLET | Freq: Four times a day (QID) | ORAL | 0 refills | Status: DC | PRN

## 2020-05-20 MED ORDER — SODIUM CHLORIDE 0.9 % IV SOLN
1.0000 g | Freq: Once | INTRAVENOUS | Status: AC
  Administered 2020-05-20: 1 g via INTRAVENOUS
  Filled 2020-05-20: qty 10

## 2020-05-20 NOTE — ED Notes (Signed)
Got patient on the monitor did ekg shown to Dr Effie Shy patient is resting with call bell in reach gotr patient some warm blankets

## 2020-05-20 NOTE — Discharge Instructions (Signed)
You have been evaluated for your symptoms.  You appear to have a urinary tract infection that can contribute to your symptoms.  Please take antibiotic as prescribed.  Take Phenergan as needed for nausea.  Follow-up closely with your doctor for further care.  Return if you have any concern.

## 2020-05-20 NOTE — ED Triage Notes (Signed)
Pt BIB [facility] w/ complaints of AMS. Pt has suffered 2 falls within the last week. Initially fell on Sunday with and unknown down time. Was found by daughter. Pt fell today again. Unable to recall any events. Pt reporting pain on R side of head/face, R shoulder and R hip area. Pt does fall frequently due to having instability to the L side as she has a hx of spinal fusion per EMS. Pt AOx3 not oriented to time. EMS did place patient in c-collar. All VSS with EMS.

## 2020-05-20 NOTE — ED Notes (Signed)
Patient transported to X-ray 

## 2020-05-20 NOTE — ED Notes (Signed)
Pt was assisted up out of her bed and ambulated around her bed a couple of times. The pt walked great but stated that she felt more nauseous and dizzy when standing and walking.

## 2020-05-20 NOTE — ED Notes (Signed)
Pt unable to complete orthostatic VS

## 2020-05-20 NOTE — ED Provider Notes (Signed)
[facility] EMERGENCY DEPARTMENT Provider Note   CSN: [id number] Arrival date & time: 05/20/20  1416     History Chief Complaint  Patient presents with  . Fall    a  . Altered Mental Status    Angie Brown is a 40 y.o. female.  The history is provided by the patient. No language interpreter was used.  Fall  Altered Mental Status    40 year old female significant pressure, depression, hypothyroidism, renal disorder, asthma, brought here via EMS for evaluation of fall and altered mental status.  Patient report she was at home and she believes she had a fall.  She is unable to tell me what caused her to.  She did endorse loss of consciousness.  She complains of pain to the right side of her forehead likely from the fall.  She also complaining of pain to her right shoulder and her right thigh.  Pain is moderate in severity, sharp with palpation.  She notes mild nausea but no vomiting.  She denies any fever chills no cold symptoms no chest pain shortness of breath abdominal pain dysuria focal numbness or focal weakness.  She does admits to occasional marijuana use but denies any alcohol use or other drug use.  No recent medication changes.  No history of seizures.  She recalls sleeping on her recliner last night.  She has not been vaccinated for COVID-19.  EMS did place patient in a c-collar upon arrival.  Patient denies any neck pain but does endorse low back pain.  Past Medical History:  Diagnosis Date  . Allergy    Wasps  . Asthma   . Bronchitis   . Depression   . Dysrhythmia    PVCs- stress related  . Hypoglycemia   . Hypothyroidism   . PONV (postoperative nausea and vomiting)    pt prefers scop patch  . Renal disorder    2008    Patient Active Problem List   Diagnosis Date Noted  . BRBPR (bright red blood per rectum) 12/22/2019  . Nausea without vomiting 12/22/2019  . Hemorrhoids 12/22/2019  . Change in bowel function 01/12/2019  . Fatty liver  01/12/2019  . Gastritis and gastroduodenitis 01/12/2019  . Abnormal LFTs 01/12/2019  . Abdominal pain, right upper quadrant 01/12/2019  . Non-intractable vomiting   . NASH (nonalcoholic steatohepatitis)   . Hepatitis 12/03/2018  . Transaminitis 12/03/2018  . 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 Abdominal pain 06/08/2017  . Chronic constipation 02/14/2017  . Asthma 01/26/2017  . Anaphylaxis 09/29/2016  . Elevated glucose 09/29/2016  . Chronic pain 06/29/2016  . 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 Voiding dysfunction 03/08/2016  . Other insomnia 03/01/2016  . Elevated blood pressure reading 01/05/2016  . GAD (generalized anxiety disorder) 01/05/2016  . Gastroesophageal reflux disease without esophagitis 01/05/2016  . Recurrent major depressive disorder (HCC) 01/05/2016  . Stress incontinence in female 03/11/2014  . HYPOTENSION 03/18/2010  . ARM PAIN 04/28/2009  . ABNORMAL INVOLUNTARY MOVEMENTS 04/28/2009  . FACIAL PARESTHESIA 04/28/2009  . HEADACHE 04/26/2009  . WHEEZING 03/23/2009  . DIARRHEA, ACUTE, CHRONIC 02/19/2009  . HYDRONEPHROSIS, RIGHT 02/18/2009  . PYELONEPHRITIS, HX OF 02/18/2009  . MIGRAINES, HX OF 02/18/2009  . LUMBAR RADICULOPATHY, RIGHT 12/23/2008  . ASTHMATIC BRONCHITIS, ACUTE 01/22/2008  . BACK PAIN 01/22/2008  . ELEVATED BLOOD PRESSURE WITHOUT DIAGNOSIS OF HYPERTENSION 01/22/2008  . OBESITY, MORBID 05/29/2007  . INSOMNIA-SLEEP DISORDER-UNSPEC 05/29/2007  . HYPERSOMNIA 05/29/2007  . ANEMIA-NOS 01/01/2007  . ANXIETY  01/01/2007  . DEPRESSION 01/01/2007  . COMMON MIGRAINE 01/01/2007  . ASTHMA 01/01/2007  . SYNCOPE 01/01/2007    Past Surgical History:  Procedure Laterality Date  . ABDOMINAL HYSTERECTOMY    . BACK SURGERY  07/2009   spinal fusion L 5-S1  . BIOPSY  12/07/2018   Procedure: BIOPSY;  Surgeon: Meridee Score Netty Starring., MD;  Location: [facility];  Service: Gastroenterology;;  . BLADDER SUSPENSION N/A 03/11/2014    Procedure: TRANSVAGINAL TAPE (TVT) PROCEDURE;  Surgeon: Lavina Hamman, MD;  Location: [facility];  Service: Gynecology;  Laterality: N/A;  . CHOLECYSTECTOMY    . ESOPHAGOGASTRODUODENOSCOPY (EGD) WITH PROPOFOL N/A 12/07/2018   Procedure: ESOPHAGOGASTRODUODENOSCOPY (EGD) WITH PROPOFOL;  Surgeon: Meridee Score Netty Starring., MD;  Location: [facility];  Service: Gastroenterology;  Laterality: N/A;  . TONSILLECTOMY AND ADENOIDECTOMY       OB History   No obstetric history on file.     Family History  Problem Relation Age of Onset  . Liver disease Mother        Fatty liver, NASH  . GI Bleed Mother   . Hypothyroidism Mother   . Hypothyroidism Sister   . Liver disease Sister        fatty liver  . Liver cancer Maternal Grandfather   . Liver disease Maternal Grandfather        fatty liver  . Hypothyroidism Sister   . Liver disease Sister        fatty liver  . Thyroid cancer Sister   . Colon cancer Neg Hx   . Esophageal cancer Neg Hx   . Rectal cancer Neg Hx   . Inflammatory bowel disease Neg Hx   . Pancreatic cancer Neg Hx   . Stomach cancer Neg Hx     Social History   Tobacco Use  . Smoking status: Current Every Day Smoker    Packs/day: 0.50    Types: Cigarettes  . Smokeless tobacco: Never Used  Vaping Use  . Vaping Use: Some days  . Substances: Nicotine, Flavoring  Substance Use Topics  . Alcohol use: Not Currently  . Drug use: No    Home Medications Prior to Admission medications   Medication Sig Start Date End Date Taking? Authorizing Provider  albuterol (PROVENTIL) (2.5 MG/3ML) 0.083% nebulizer solution Take 2.5 mg by nebulization every 6 (six) hours as needed for wheezing or shortness of breath.  04/03/19   [provider]  albuterol (VENTOLIN HFA) 108 (90 Base) MCG/ACT inhaler Inhale 1-2 puffs into the lungs every 6 (six) hours as needed for wheezing or shortness of breath. 04/01/19   Hall-Potvin, [place], PA-C  azithromycin (ZITHROMAX) 250 MG tablet Take 1  tablet (250 mg total) by mouth daily. Take first 2 tablets together, then 1 every day until finished. 04/27/20   Rodriguez-Southworth, Nettie Elm, PA-C  benzonatate (TESSALON) 200 MG capsule Take 1 capsule (200 mg total) by mouth 2 (two) times daily as needed for cough. 04/27/20   Rodriguez-Southworth, Nettie Elm, PA-C  carvedilol (COREG) 6.25 MG tablet Take 6.25 mg by mouth every evening.    [provider]  clonazePAM (KLONOPIN) 1 MG tablet Take 1 mg by mouth 2 (two) times daily. 06/18/18   [provider]  cyclobenzaprine (FLEXERIL) 10 MG tablet Take 10 mg by mouth at bedtime.    [provider]  dicyclomine (BENTYL) 10 MG capsule Take 1 capsule (10 mg total) by mouth 4 (four) times daily -  before meals and at bedtime. 01/08/20   Mansouraty, Netty Starring., MD  diphenhydrAMINE (BENADRYL) 25 mg capsule Take 25 mg by mouth at bedtime as needed for allergies.     [provider]  EPIPEN 2-PAK 0.3 MG/0.3ML SOAJ injection Inject 0.3 mLs (0.3 mg total) into the muscle as needed (for allergic reaction). 10/13/18   Rolly Salter, MD  fluticasone (FLONASE) 50 MCG/ACT nasal spray Place 1 spray into both nostrils at bedtime.    [provider]  folic acid (FOLVITE) 1 MG tablet Take 1 mg by mouth daily. 09/09/19   [provider]  gabapentin (NEURONTIN) 300 MG capsule Take 900 mg by mouth at bedtime.    [provider]  levothyroxine (SYNTHROID) 50 MCG tablet Take 50 mcg by mouth every evening.     [provider]  meloxicam (MOBIC) 15 MG tablet Take 15 mg by mouth at bedtime.  08/26/19   [provider]  montelukast (SINGULAIR) 10 MG tablet Take 10 mg by mouth at bedtime.    [provider]  mupirocin cream (BACTROBAN) 2 % Apply 1 application topically 2 (two) times daily. 04/27/20   Rodriguez-Southworth, Nettie Elm, PA-C  omeprazole (PRILOSEC) 40 MG capsule Take 1 capsule (40 mg total) by mouth 2 (two) times daily at 8 am and 10 pm. 01/09/19    Mansouraty, Netty Starring., MD  oxyCODONE-acetaminophen (PERCOCET) 10-325 MG tablet Take 1 tablet by mouth every 8 (eight) hours as needed for pain. Patient not taking: Reported on 03/16/2020    [provider]  promethazine (PHENERGAN) 25 MG tablet Take 1 tablet (25 mg total) by mouth every 6 (six) hours as needed for nausea or vomiting. 09/19/19   Robinson, [place] N, PA-C  promethazine (PHENERGAN) 25 MG tablet TAKE 1 TABLET BY MOUTH EVERY 6 HOURS AS NEEDED FOR NAUSEA OR VOMITING. 03/17/20   Mansouraty, Netty Starring., MD  TRANSDERM-SCOP 1 MG/3DAYS PLACE 1 PATCH ONTO THE SKIN EVERY 3 DAYS. 05/18/20   Mansouraty, Netty Starring., MD  Vitamin D, Ergocalciferol, (DRISDOL) 1.25 MG (50000 UNIT) CAPS capsule Take 50,000 Units by mouth once a week. 09/09/19   [provider]  vortioxetine HBr (TRINTELLIX) 10 MG TABS tablet Take 10 mg by mouth daily.    [provider]    Allergies    Loratadine, Wasp venom, Sumatriptan, Doxycycline, Ibuprofen, and Methocarbamol  Review of Systems   Review of Systems  All other systems reviewed and are negative.   Physical Exam Updated Vital Signs BP (!) 127/95 (BP Location: Right Arm)   Pulse 77   Temp 97.8 F (36.6 C) (Oral)   Resp 20   Ht 5\' 7"  (1.702 m)   Wt 103 kg   SpO2 100%   BMI 35.55 kg/m   Physical Exam Vitals and nursing note reviewed.  Constitutional:      General: She is not in acute distress.    Appearance: She is well-developed and well-nourished. She is obese.  HENT:     Head: Normocephalic.     Comments: Tenderness to right side of forehead and right orbital region without any crepitus emphysema or hematoma noted.  No midface tenderness.  No raccoon's eyes, no battle sign, no scalp tenderness Eyes:     Extraocular Movements: Extraocular movements intact.     Conjunctiva/sclera: Conjunctivae normal.     Pupils: Pupils are equal, round, and reactive to light.  Neck:     Comments: No cervical midline spine tenderness  crepitus or step-off. Cardiovascular:     Rate and Rhythm: Normal rate and regular rhythm.  Pulses: Normal pulses.     Heart sounds: Normal heart sounds.  Pulmonary:     Effort: Pulmonary effort is normal.     Breath sounds: Normal breath sounds.  Chest:     Chest wall: No tenderness.  Abdominal:     Palpations: Abdomen is soft.  Musculoskeletal:        General: Tenderness (Right hip: Tenderness to right thigh but right hip with full range of motion.) present.     Cervical back: Normal range of motion and neck supple. No tenderness.     Comments: Right shoulder: Mild tenderness to right shoulder on palpation with normal range of motion no swelling noted.  Skin:    Findings: No rash.  Neurological:     Mental Status: She is alert.     GCS: GCS eye subscore is 4. GCS verbal subscore is 5. GCS motor subscore is 6.     Cranial Nerves: Cranial nerves are intact.     Sensory: Sensation is intact.     Motor: Motor function is intact.     Comments: Alert and oriented x2.  Psychiatric:        Mood and Affect: Mood and affect and mood normal.     ED Results / Procedures / Treatments   Labs (all labs ordered are listed, but only abnormal results are displayed) Labs Reviewed  BASIC METABOLIC PANEL - Abnormal; Notable for the following components:      Result Value   Potassium 3.4 (*)    Glucose, Bld 108 (*)    All other components within normal limits  URINALYSIS, ROUTINE W REFLEX MICROSCOPIC - Abnormal; Notable for the following components:   APPearance CLOUDY (*)    Nitrite POSITIVE (*)    Leukocytes,Ua LARGE (*)    WBC, UA >50 (*)    Bacteria, UA MANY (*)    All other components within normal limits  RAPID URINE DRUG SCREEN, HOSP PERFORMED - Abnormal; Notable for the following components:   Opiates POSITIVE (*)    Benzodiazepines POSITIVE (*)    Tetrahydrocannabinol POSITIVE (*)    All other components within normal limits  CBG MONITORING, ED - Abnormal; Notable for the  following components:   Glucose-Capillary 104 (*)    All other components within normal limits  URINE CULTURE  CBC WITH DIFFERENTIAL/PLATELET  TSH    EKG EKG Interpretation  Date/Time:  Thursday May 20 2020 14:18:37 EST Ventricular Rate:  91 PR Interval:    QRS Duration: 90 QT Interval:  371 QTC Calculation: 457 R Axis:   72 Text Interpretation: Sinus rhythm since last tracing no significant change Confirmed by Mancel Bale [phone]) on 05/20/2020 2:28:29 PM   Radiology DG Lumbar Spine Complete  Result Date: 05/20/2020 CLINICAL DATA:  Low back pain after fall. EXAM: LUMBAR SPINE - COMPLETE 4+ VIEW COMPARISON:  December 11, 2019. FINDINGS: Status post surgical posterior fusion of L5-S1 with bilateral intrapedicular screw placement and interbody fusion. No fracture or spondylolisthesis is noted. Disc spaces are well-maintained. IMPRESSION: Postsurgical changes as described above. No acute abnormality seen in the lumbar spine. Electronically Signed   By: Lupita Raider M.D.   On: 05/20/2020 15:57   CT HEAD WO CONTRAST  Result Date: 05/20/2020 CLINICAL DATA:  Head trauma EXAM: CT HEAD WITHOUT CONTRAST TECHNIQUE: Contiguous axial images were obtained from the base of the skull through the vertex without intravenous contrast. COMPARISON:  CT 08/20/2018 FINDINGS: Brain: No evidence of acute infarction, hemorrhage, hydrocephalus, extra-axial collection or mass  lesion/mass effect. Vascular: No hyperdense vessel or unexpected calcification. Skull: Normal. Negative for fracture or focal lesion. Sinuses/Orbits: No acute finding. Other: None IMPRESSION: Negative non contrasted CT appearance of the brain. Electronically Signed   By: Jasmine PangKim  Fujinaga M.D.   On: 05/20/2020 16:36    Procedures Procedures   Medications Ordered in ED Medications  cefTRIAXone (ROCEPHIN) 1 g in sodium chloride 0.9 % 100 mL IVPB (0 g Intravenous Stopped 05/20/20 2025)  morphine 4 MG/ML injection 4 mg (4 mg Intravenous  Given 05/20/20 1955)  promethazine (PHENERGAN) injection 25 mg (25 mg Intravenous Given 05/20/20 1956)    ED Course  I have reviewed the triage vital signs and the nursing notes.  Pertinent labs & imaging results that were available during my care of the patient were reviewed by me and considered in my medical decision making (see chart for details).    MDM Rules/Calculators/A&P                          BP (!) 127/95 (BP Location: Right Arm)   Pulse 77   Temp 97.8 F (36.6 C) (Oral)   Resp 20   Ht 5\' 7"  (1.702 m)   Wt 103 kg   SpO2 100%   BMI 35.55 kg/m   Final Clinical Impression(s) / ED Diagnoses Final diagnoses:  Fall, initial encounter  Lower urinary tract infectious disease    Rx / DC Orders ED Discharge Orders         Ordered    promethazine (PHENERGAN) 25 MG tablet  Every 6 hours PRN        05/20/20 2046    cephALEXin (KEFLEX) 500 MG capsule  4 times daily        05/20/20 2046         2:45 PM Patient brought here for fall.  Unable to tell exactly what caused her fall.  She does complain of pain to her right forehead, lower back, right shoulder and right side.  Will obtain appropriate imaging.  She does not exhibit any symptoms suggestive of seizure activity.  Work-up initiated.  Upon further note reviewed, patient was seen and evaluated for moderate episodes of recurrent major depressive disorder.  Her symptoms today may be related to psychiatric illness. However, will check labs, head CT and UA.   6:27 PM Labs are reassuring, head CT scan unremarkable, x-ray of lumbar spine without acute finding, UA however shows cloudy appearance, large leukocyte esterase, greater than 50 WBC and many bacteria along with nitrite positive.  In the setting of confusion and a positive urinalysis concerning for UTI, will give Rocephin.  Patient ambulated without difficulty, she can be discharged home with Keflex as treatment for urinary tract infection. Some level of delirium may have  contributed to her sxs.  Pt also positive for opiate, benzo, and THC which may account for her fall and altered sensorium.     Fayrene Helperran, [place], PA-C 05/20/20 [id number]    Mancel BaleWentz, [place], MD 05/22/20 [phone]

## 2020-05-20 NOTE — ED Notes (Signed)
Brought out to lobby, Sort staff made aware pt is just waiting on friend to get car.

## 2020-05-20 NOTE — ED Notes (Signed)
Unable to get vitals pt in imaging

## 2020-05-23 LAB — URINE CULTURE: Culture: >=100000 — AB

## 2020-07-16 ENCOUNTER — Ambulatory Visit
Admission: RE | Admit: 2020-07-16 | Discharge: 2020-07-16 | Disposition: A | Discharge status: Home / Self Care | Payer: Medicaid Other | Source: Ambulatory Visit | Attending: Emergency Medicine | Admitting: Emergency Medicine

## 2020-07-16 ENCOUNTER — Other Ambulatory Visit: Payer: Self-pay

## 2020-07-16 VITALS — BP 142/95 | HR 100 | Temp 98.2°F | Resp 16

## 2020-07-16 DIAGNOSIS — J3489 Other specified disorders of nose and nasal sinuses: Secondary | ICD-10-CM | POA: Diagnosis not present

## 2020-07-16 DIAGNOSIS — G43909 Migraine, unspecified, not intractable, without status migrainosus: Secondary | ICD-10-CM | POA: Diagnosis not present

## 2020-07-16 MED ORDER — PREDNISONE 20 MG PO TABS: 40.0000 mg | ORAL_TABLET | Freq: Every day | ORAL | 0 refills | Status: AC

## 2020-07-16 MED ORDER — BENZONATATE 200 MG PO CAPS: 200.0000 mg | ORAL_CAPSULE | Freq: Three times a day (TID) | ORAL | 0 refills | Status: AC | PRN

## 2020-07-16 MED ORDER — AZITHROMYCIN 250 MG PO TABS: 250.0000 mg | ORAL_TABLET | Freq: Every day | ORAL | 0 refills | Status: DC

## 2020-07-16 MED ORDER — METOCLOPRAMIDE HCL 5 MG/ML IJ SOLN
5.0000 mg | Freq: Once | INTRAMUSCULAR | Status: AC
  Administered 2020-07-16: 5 mg via INTRAMUSCULAR

## 2020-07-16 MED ORDER — DEXAMETHASONE SODIUM PHOSPHATE 10 MG/ML IJ SOLN
10.0000 mg | Freq: Once | INTRAMUSCULAR | Status: AC
  Administered 2020-07-16: 10 mg via INTRAMUSCULAR

## 2020-07-16 MED ORDER — KETOROLAC TROMETHAMINE 30 MG/ML IJ SOLN
30.0000 mg | Freq: Once | INTRAMUSCULAR | Status: AC
  Administered 2020-07-16: 30 mg via INTRAMUSCULAR

## 2020-07-16 NOTE — Discharge Instructions (Addendum)
We gave you an injection of Toradol, Decadron and Reglan today for your headache Prednisone course x5 days Tessalon for cough Mucinex DM twice daily Rest and fluids Follow-up if not improving or worsening

## 2020-07-16 NOTE — ED Triage Notes (Addendum)
Pt present coughing with sore throat and nasal congestion. Symptoms started two days ago. Pt states she took an at home covid test yesterday and the result was negative. Pt states she is having a severe headache

## 2020-07-16 NOTE — ED Provider Notes (Signed)
[facility]    CSN: [id number] Arrival date & time: 07/16/20  1252      History   Chief Complaint Chief Complaint  Patient presents with  . Cough  . Nasal Congestion  . Sore Throat    HPI Angie Brown is a 40 y.o. female history of asthma, presenting today for evaluation of congestion.  Reports sinus pressure congestion, migraine x2 to 3 days.  Denies any fevers.  Migraine located on right orbital area.  Associated photophobia.  Mild nausea.  Took at home COVID test which was negative.  Reports history of similar related to allergies/asthma/bronchitis.  HPI  Past Medical History:  Diagnosis Date  . Allergy    Wasps  . Asthma   . Bronchitis   . Depression   . Dysrhythmia    PVCs- stress related  . Hypoglycemia   . Hypothyroidism   . PONV (postoperative nausea and vomiting)    pt prefers scop patch  . Renal disorder    2008    Patient Active Problem List   Diagnosis Date Noted  . BRBPR (bright red blood per rectum) 12/22/2019  . Nausea without vomiting 12/22/2019  . Hemorrhoids 12/22/2019  . Change in bowel function 01/12/2019  . Fatty liver 01/12/2019  . Gastritis and gastroduodenitis 01/12/2019  . Abnormal LFTs 01/12/2019  . Abdominal pain, right upper quadrant 01/12/2019  . Non-intractable vomiting   . NASH (nonalcoholic steatohepatitis)   . Hepatitis 12/03/2018  . Transaminitis 12/03/2018  . 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 Abdominal pain 06/08/2017  . Chronic constipation 02/14/2017  . Asthma 01/26/2017  . Anaphylaxis 09/29/2016  . Elevated glucose 09/29/2016  . Chronic pain 06/29/2016  . 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 Voiding dysfunction 03/08/2016  . Other insomnia 03/01/2016  . Elevated blood pressure reading 01/05/2016  . GAD (generalized anxiety disorder) 01/05/2016  . Gastroesophageal reflux disease without esophagitis 01/05/2016  . Recurrent major depressive disorder (HCC) 01/05/2016  .  Stress incontinence in female 03/11/2014  . HYPOTENSION 03/18/2010  . ARM PAIN 04/28/2009  . ABNORMAL INVOLUNTARY MOVEMENTS 04/28/2009  . FACIAL PARESTHESIA 04/28/2009  . HEADACHE 04/26/2009  . WHEEZING 03/23/2009  . DIARRHEA, ACUTE, CHRONIC 02/19/2009  . HYDRONEPHROSIS, RIGHT 02/18/2009  . PYELONEPHRITIS, HX OF 02/18/2009  . MIGRAINES, HX OF 02/18/2009  . LUMBAR RADICULOPATHY, RIGHT 12/23/2008  . ASTHMATIC BRONCHITIS, ACUTE 01/22/2008  . BACK PAIN 01/22/2008  . ELEVATED BLOOD PRESSURE WITHOUT DIAGNOSIS OF HYPERTENSION 01/22/2008  . OBESITY, MORBID 05/29/2007  . INSOMNIA-SLEEP DISORDER-UNSPEC 05/29/2007  . HYPERSOMNIA 05/29/2007  . ANEMIA-NOS 01/01/2007  . ANXIETY 01/01/2007  . DEPRESSION 01/01/2007  . COMMON MIGRAINE 01/01/2007  . ASTHMA 01/01/2007  . SYNCOPE 01/01/2007    Past Surgical History:  Procedure Laterality Date  . ABDOMINAL HYSTERECTOMY    . BACK SURGERY  07/2009   spinal fusion L 5-S1  . BIOPSY  12/07/2018   Procedure: BIOPSY;  Surgeon: Meridee Score Netty Starring., MD;  Location: [facility];  Service: Gastroenterology;;  . BLADDER SUSPENSION N/A 03/11/2014   Procedure: TRANSVAGINAL TAPE (TVT) PROCEDURE;  Surgeon: Lavina Hamman, MD;  Location: [facility];  Service: Gynecology;  Laterality: N/A;  . CHOLECYSTECTOMY    . ESOPHAGOGASTRODUODENOSCOPY (EGD) WITH PROPOFOL N/A 12/07/2018   Procedure: ESOPHAGOGASTRODUODENOSCOPY (EGD) WITH PROPOFOL;  Surgeon: Meridee Score Netty Starring., MD;  Location: [facility];  Service: Gastroenterology;  Laterality: N/A;  . TONSILLECTOMY AND ADENOIDECTOMY      OB History   No obstetric history on file.  Home Medications    Prior to Admission medications   Medication Sig Start Date End Date Taking? Authorizing Provider  benzonatate (TESSALON) 200 MG capsule Take 1 capsule (200 mg total) by mouth 3 (three) times daily as needed for up to 7 days for cough. 07/16/20 07/23/20 Yes Kolette Vey C, PA-C  predniSONE (DELTASONE) 20 MG tablet  Take 2 tablets (40 mg total) by mouth daily with breakfast for 5 days. 07/16/20 07/21/20 Yes Erinn Huskins C, PA-C  albuterol (PROVENTIL) (2.5 MG/3ML) 0.083% nebulizer solution Take 2.5 mg by nebulization every 6 (six) hours as needed for wheezing or shortness of breath.  04/03/19   [provider]  albuterol (VENTOLIN HFA) 108 (90 Base) MCG/ACT inhaler Inhale 1-2 puffs into the lungs every 6 (six) hours as needed for wheezing or shortness of breath. 04/01/19   Hall-Potvin, [place], PA-C  clonazePAM (KLONOPIN) 1 MG tablet Take 1 mg by mouth 2 (two) times daily. 06/18/18   [provider]  cyclobenzaprine (FLEXERIL) 10 MG tablet Take 10 mg by mouth at bedtime.    [provider]  dicyclomine (BENTYL) 10 MG capsule Take 1 capsule (10 mg total) by mouth 4 (four) times daily -  before meals and at bedtime. 01/08/20   Mansouraty, Netty Starring., MD  diphenhydrAMINE (BENADRYL) 25 mg capsule Take 25 mg by mouth at bedtime as needed for allergies.     [provider]  EPIPEN 2-PAK 0.3 MG/0.3ML SOAJ injection Inject 0.3 mLs (0.3 mg total) into the muscle as needed (for allergic reaction). Patient taking differently: Inject 0.3 mg into the muscle daily as needed for anaphylaxis. 10/13/18   Rolly Salter, MD  FLUoxetine (PROZAC) 10 MG tablet Take 10 mg by mouth 2 (two) times daily.    [provider]  fluticasone (FLONASE) 50 MCG/ACT nasal spray Place 1 spray into both nostrils at bedtime.    [provider]  folic acid (FOLVITE) 1 MG tablet Take 1 mg by mouth daily. 09/09/19   [provider]  gabapentin (NEURONTIN) 300 MG capsule Take 600 mg by mouth 2 (two) times daily.    [provider]  hydrOXYzine (VISTARIL) 25 MG capsule Take 25 mg by mouth 3 (three) times daily as needed for anxiety.    [provider]  montelukast (SINGULAIR) 10 MG tablet Take 10 mg by mouth at bedtime.    [provider]  Naphazoline-Pheniramine  (VISINE-A OP) Place 1 drop into both eyes daily as needed (red eyes).    [provider]  omeprazole (PRILOSEC) 40 MG capsule Take 1 capsule (40 mg total) by mouth 2 (two) times daily at 8 am and 10 pm. Patient taking differently: Take 40 mg by mouth daily. 01/09/19   Mansouraty, Netty Starring., MD  oxyCODONE-acetaminophen (PERCOCET) 10-325 MG tablet Take 1 tablet by mouth every 8 (eight) hours as needed for pain.    [provider]  promethazine (PHENERGAN) 25 MG tablet Take 1 tablet (25 mg total) by mouth every 6 (six) hours as needed. 05/20/20   Fayrene Helper, PA-C  rizatriptan (MAXALT) 10 MG tablet Take 10 mg by mouth as needed for migraine. May repeat in 2 hours if needed    [provider]  rosuvastatin (CRESTOR) 20 MG tablet Take 20 mg by mouth daily. 05/11/20   [provider]  TRANSDERM-SCOP 1 MG/3DAYS PLACE 1 PATCH ONTO THE SKIN EVERY 3 DAYS. Patient not taking: No sig reported 05/18/20   Mansouraty, Netty Starring., MD  Vitamin D, Ergocalciferol, (  DRISDOL) 1.25 MG (50000 UNIT) CAPS capsule Take 50,000 Units by mouth once a week. 09/09/19   [provider]    Family History Family History  Problem Relation Age of Onset  . Liver disease Mother        Fatty liver, NASH  . GI Bleed Mother   . Hypothyroidism Mother   . Hypothyroidism Sister   . Liver disease Sister        fatty liver  . Liver cancer Maternal Grandfather   . Liver disease Maternal Grandfather        fatty liver  . Hypothyroidism Sister   . Liver disease Sister        fatty liver  . Thyroid cancer Sister   . Colon cancer Neg Hx   . Esophageal cancer Neg Hx   . Rectal cancer Neg Hx   . Inflammatory bowel disease Neg Hx   . Pancreatic cancer Neg Hx   . Stomach cancer Neg Hx     Social History Social History   Tobacco Use  . Smoking status: Current Every Day Smoker    Packs/day: 0.50    Types: Cigarettes  . Smokeless tobacco: Never Used  Vaping Use  . Vaping Use: Some days   . Substances: Nicotine, Flavoring  Substance Use Topics  . Alcohol use: Not Currently  . Drug use: No     Allergies   Loratadine, Wasp venom, Sumatriptan, Tape, Doxycycline, Ibuprofen, and Methocarbamol   Review of Systems Review of Systems  Constitutional: Negative for activity change, appetite change, chills, fatigue and fever.  HENT: Positive for congestion, rhinorrhea and sinus pressure. Negative for ear pain, sore throat and trouble swallowing.   Eyes: Positive for photophobia. Negative for discharge and redness.  Respiratory: Positive for cough and chest tightness. Negative for shortness of breath.   Cardiovascular: Negative for chest pain.  Gastrointestinal: Positive for nausea. Negative for abdominal pain, diarrhea and vomiting.  Musculoskeletal: Negative for myalgias.  Skin: Negative for rash.  Neurological: Positive for headaches. Negative for dizziness and light-headedness.     Physical Exam Triage Vital Signs ED Triage Vitals  Enc Vitals Group     BP      Pulse      Resp      Temp      Temp src      SpO2      Weight      Height      Head Circumference      Peak Flow      Pain Score      Pain Loc      Pain Edu?      Excl. in GC?    No data found.  Updated Vital Signs BP (!) 142/95 (BP Location: Left Arm)   Pulse 100   Temp 98.2 F (36.8 C) (Oral)   Resp 16   SpO2 97%   Visual Acuity Right Eye Distance:   Left Eye Distance:   Bilateral Distance:    Right Eye Near:   Left Eye Near:    Bilateral Near:     Physical Exam Vitals and nursing note reviewed.  Constitutional:      Appearance: She is well-developed.     Comments: No acute distress  HENT:     Head: Normocephalic and atraumatic.     Ears:     Comments: Bilateral ears without tenderness to palpation of external auricle, tragus and mastoid, EAC's without erythema or swelling, TM's with good bony landmarks and  cone of light. Non erythematous.     Nose: Nose normal.      Mouth/Throat:     Comments: Oral mucosa pink and moist, no tonsillar enlargement or exudate. Posterior pharynx patent and nonerythematous, no uvula deviation or swelling. Normal phonation. Eyes:     Conjunctiva/sclera: Conjunctivae normal.  Cardiovascular:     Rate and Rhythm: Normal rate.  Pulmonary:     Effort: Pulmonary effort is normal. No respiratory distress.     Comments: Breathing comfortably at rest, CTABL, no wheezing, rales or other adventitious sounds auscultated Abdominal:     General: There is no distension.  Musculoskeletal:        General: Normal range of motion.     Cervical back: Neck supple.  Skin:    General: Skin is warm and dry.  Neurological:     Mental Status: She is alert and oriented to person, place, and time.      UC Treatments / Results  Labs (all labs ordered are listed, but only abnormal results are displayed) Labs Reviewed - No data to display  EKG   Radiology No results found.  Procedures Procedures (including critical care time)  Medications Ordered in UC Medications  ketorolac (TORADOL) 30 MG/ML injection 30 mg (has no administration in time range)  metoCLOPramide (REGLAN) injection 5 mg (has no administration in time range)  dexamethasone (DECADRON) injection 10 mg (has no administration in time range)    Initial Impression / Assessment and Plan / UC Course  I have reviewed the triage vital signs and the nursing notes.  Pertinent labs & imaging results that were available during my care of the patient were reviewed by me and considered in my medical decision making (see chart for details).     Sinusitis/migraine-treating with Toradol Decadron and Reglan today for migraine, continuing on prednisone x5 days, suspect likely inflammatory/allergic rhinitis rather than underlying bacterial cause at this point.  Continue symptomatic and supportive care of cough and congestion as well rest and fluids.  Sent prescription for azithromycin to  pharmacy to fill on Wednesday if not having any improvement  Discussed strict return precautions. Patient verbalized understanding and is agreeable with plan.  Final Clinical Impressions(s) / UC Diagnoses   Final diagnoses:  Migraine without status migrainosus, not intractable, unspecified migraine type  Sinus pressure     Discharge Instructions     We gave you an injection of Toradol, Decadron and Reglan today for your headache Prednisone course x5 days Tessalon for cough Mucinex DM twice daily Rest and fluids Follow-up if not improving or worsening     ED Prescriptions    Medication Sig Dispense Auth. Provider   predniSONE (DELTASONE) 20 MG tablet Take 2 tablets (40 mg total) by mouth daily with breakfast for 5 days. 10 tablet Kathryn Cosby C, PA-C   benzonatate (TESSALON) 200 MG capsule Take 1 capsule (200 mg total) by mouth 3 (three) times daily as needed for up to 7 days for cough. 28 capsule Letrell Attwood, [place] C, PA-C     PDMP not reviewed this encounter.   Shin Lamour, [place] C, PA-C 07/16/20 1330

## 2020-07-29 ENCOUNTER — Telehealth: Payer: Self-pay | Admitting: Internal Medicine

## 2020-07-29 NOTE — Telephone Encounter (Signed)
Pt called tonight. Reports 48 hours of severe nausea, upper abdominal bloating/swelling, dyspnea on exertion.  No fever.  No obvious jaundice.  No vomiting.  No blood in stool or melena. Has promethazine for nausea and scopolamine patch. Now out of promethazine.  Avoiding zofran as this previously caused constipation. Poor appetite, but able to drink fluids. She is worried about her liver function and she states this is how she felt when she was hospitalized for "hepatitis". She would like to avoid the ER.  I recommended continue fluids.  She is instructed to go to the ER if fever, worsening upper abd pain, inability to PO I called promethazine 25 mg every 6 hours PRN nausea to the [facility] on [address], [address], no refills.    Office to check on her tomorrow. She will need to come for CBC, CMP, INR APP visit if available  CC: Dr. Meridee Score, Hilma Favors, RN

## 2020-07-30 ENCOUNTER — Emergency Department (HOSPITAL_COMMUNITY)
Admission: EM | Admit: 2020-07-30 | Discharge: 2020-07-31 | Disposition: A | Discharge status: Home / Self Care | Payer: Medicaid Other | Attending: Emergency Medicine | Admitting: Emergency Medicine

## 2020-07-30 ENCOUNTER — Other Ambulatory Visit: Payer: Self-pay

## 2020-07-30 ENCOUNTER — Emergency Department (HOSPITAL_COMMUNITY): Payer: Medicaid Other

## 2020-07-30 ENCOUNTER — Encounter (HOSPITAL_COMMUNITY): Payer: Self-pay

## 2020-07-30 DIAGNOSIS — R1011 Right upper quadrant pain: Secondary | ICD-10-CM | POA: Insufficient documentation

## 2020-07-30 DIAGNOSIS — R110 Nausea: Secondary | ICD-10-CM | POA: Diagnosis not present

## 2020-07-30 DIAGNOSIS — Z87891 Personal history of nicotine dependence: Secondary | ICD-10-CM | POA: Diagnosis not present

## 2020-07-30 DIAGNOSIS — J45909 Unspecified asthma, uncomplicated: Secondary | ICD-10-CM | POA: Diagnosis not present

## 2020-07-30 DIAGNOSIS — E039 Hypothyroidism, unspecified: Secondary | ICD-10-CM | POA: Insufficient documentation

## 2020-07-30 DIAGNOSIS — Z79899 Other long term (current) drug therapy: Secondary | ICD-10-CM | POA: Diagnosis not present

## 2020-07-30 DIAGNOSIS — R630 Anorexia: Secondary | ICD-10-CM | POA: Insufficient documentation

## 2020-07-30 DIAGNOSIS — R109 Unspecified abdominal pain: Secondary | ICD-10-CM

## 2020-07-30 LAB — COMPREHENSIVE METABOLIC PANEL
ALT: 29 U/L (ref 0–44)
AST: 26 U/L (ref 15–41)
Albumin: 4 g/dL (ref 3.5–5.0)
Alkaline Phosphatase: 96 U/L (ref 38–126)
Anion gap: 8 (ref 5–15)
BUN: 11 mg/dL (ref 6–20)
CO2: 29 mmol/L (ref 22–32)
Calcium: 9.4 mg/dL (ref 8.9–10.3)
Chloride: 102 mmol/L (ref 98–111)
Creatinine, Ser: 0.85 mg/dL (ref 0.44–1.00)
GFR, Estimated: >60 mL/min (ref >60)
Glucose, Bld: 111 mg/dL — ABNORMAL HIGH (ref 70–99)
Potassium: 4.5 mmol/L (ref 3.5–5.1)
Sodium: 139 mmol/L (ref 135–145)
Total Bilirubin: 0.5 mg/dL (ref 0.3–1.2)
Total Protein: 7.8 g/dL (ref 6.5–8.1)

## 2020-07-30 LAB — CBC WITH DIFFERENTIAL/PLATELET
Abs Immature Granulocytes: 0.04 10*3/uL (ref 0.00–0.07)
Basophils Absolute: 0 10*3/uL (ref 0.0–0.1)
Basophils Relative: 0 %
Eosinophils Absolute: 0.1 10*3/uL (ref 0.0–0.5)
Eosinophils Relative: 1 %
HCT: 43.4 % (ref 36.0–46.0)
Hemoglobin: 14.1 g/dL (ref 12.0–15.0)
Immature Granulocytes: 0 %
Lymphocytes Relative: 23 %
Lymphs Abs: 2.6 10*3/uL (ref 0.7–4.0)
MCH: 29.1 pg (ref 26.0–34.0)
MCHC: 32.5 g/dL (ref 30.0–36.0)
MCV: 89.7 fL (ref 80.0–100.0)
Monocytes Absolute: 0.6 10*3/uL (ref 0.1–1.0)
Monocytes Relative: 5 %
Neutro Abs: 8 10*3/uL — ABNORMAL HIGH (ref 1.7–7.7)
Neutrophils Relative %: 71 %
Platelets: 295 10*3/uL (ref 150–400)
RBC: 4.84 MIL/uL (ref 3.87–5.11)
RDW: 12.5 % (ref 11.5–15.5)
WBC: 11.3 10*3/uL — ABNORMAL HIGH (ref 4.0–10.5)
nRBC: 0 % (ref 0.0–0.2)

## 2020-07-30 LAB — URINALYSIS, ROUTINE W REFLEX MICROSCOPIC
Bilirubin Urine: NEGATIVE
Glucose, UA: NEGATIVE mg/dL
Hgb urine dipstick: NEGATIVE
Ketones, ur: NEGATIVE mg/dL
Leukocytes,Ua: NEGATIVE
Nitrite: NEGATIVE
Protein, ur: NEGATIVE mg/dL
Specific Gravity, Urine: 1.014 (ref 1.005–1.030)
pH: 5 (ref 5.0–8.0)

## 2020-07-30 LAB — I-STAT BETA HCG BLOOD, ED (MC, WL, AP ONLY): I-stat hCG, quantitative: <5 m[IU]/mL (ref <5)

## 2020-07-30 LAB — LIPASE, BLOOD: Lipase: 32 U/L (ref 11–51)

## 2020-07-30 MED ORDER — ONDANSETRON 4 MG PO TBDP
4.0000 mg | ORAL_TABLET | Freq: Once | ORAL | Status: AC
  Administered 2020-07-30: 4 mg via ORAL
  Filled 2020-07-30: qty 1

## 2020-07-30 NOTE — ED Notes (Signed)
Patient informed a urine sample is needed. She said she could not supply one at this time.

## 2020-07-30 NOTE — Telephone Encounter (Signed)
Thanks for taking call.  Sorry was in the evening. Agree with plan of action. When labs return if abnormal will need to be evaluated by MD of the since I am away.

## 2020-07-30 NOTE — ED Provider Notes (Signed)
[facility]-EMERGENCY DEPT Provider Note   CSN: [id number] Arrival date & time: 07/30/20  1438     History Chief Complaint  Patient presents with  . Abdominal Pain    SHENICA HOLZHEIMER is a 40 y.o. female.  HPI      JENIYA FLANNIGAN is a 40 y.o. female, with a history of transaminitis, hepatitis, presenting to the ED with abdominal pain for the last 2 days. The pain is cramping and sharp, right-sided abdomen, worst in the right upper quadrant, waxing and waning. She states the last time she had similar pain it was "from the liver." She contacted her GI specialist and was advised to come to the ED for further evaluation. Pain is accompanied by nausea and loss of appetite.  She has been taking Phenergan.  Denies fever/chills, chest pain, shortness of breath, hematochezia/melena, diarrhea, vomiting, urinary symptoms, or any other complaints.  Past Medical History:  Diagnosis Date  . Allergy    Wasps  . Asthma   . Bronchitis   . Depression   . Dysrhythmia    PVCs- stress related  . Hypoglycemia   . Hypothyroidism   . PONV (postoperative nausea and vomiting)    pt prefers scop patch  . Renal disorder    2008    Patient Active Problem List   Diagnosis Date Noted  . BRBPR (bright red blood per rectum) 12/22/2019  . Nausea without vomiting 12/22/2019  . Hemorrhoids 12/22/2019  . Change in bowel function 01/12/2019  . Fatty liver 01/12/2019  . Gastritis and gastroduodenitis 01/12/2019  . Abnormal LFTs 01/12/2019  . Abdominal pain, right upper quadrant 01/12/2019  . Non-intractable vomiting   . NASH (nonalcoholic steatohepatitis)   . Hepatitis 12/03/2018  . Transaminitis 12/03/2018  . 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 Abdominal pain 06/08/2017  . Chronic constipation 02/14/2017  . Asthma 01/26/2017  . Anaphylaxis 09/29/2016  . Elevated glucose 09/29/2016  . Chronic pain 06/29/2016  . 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 Voiding dysfunction 03/08/2016  . Other insomnia 03/01/2016  . Elevated blood pressure reading 01/05/2016  . GAD (generalized anxiety disorder) 01/05/2016  . Gastroesophageal reflux disease without esophagitis 01/05/2016  . Recurrent major depressive disorder (HCC) 01/05/2016  . Stress incontinence in female 03/11/2014  . HYPOTENSION 03/18/2010  . ARM PAIN 04/28/2009  . ABNORMAL INVOLUNTARY MOVEMENTS 04/28/2009  . FACIAL PARESTHESIA 04/28/2009  . HEADACHE 04/26/2009  . WHEEZING 03/23/2009  . DIARRHEA, ACUTE, CHRONIC 02/19/2009  . HYDRONEPHROSIS, RIGHT 02/18/2009  . PYELONEPHRITIS, HX OF 02/18/2009  . MIGRAINES, HX OF 02/18/2009  . LUMBAR RADICULOPATHY, RIGHT 12/23/2008  . ASTHMATIC BRONCHITIS, ACUTE 01/22/2008  . BACK PAIN 01/22/2008  . ELEVATED BLOOD PRESSURE WITHOUT DIAGNOSIS OF HYPERTENSION 01/22/2008  . OBESITY, MORBID 05/29/2007  . INSOMNIA-SLEEP DISORDER-UNSPEC 05/29/2007  . HYPERSOMNIA 05/29/2007  . ANEMIA-NOS 01/01/2007  . ANXIETY 01/01/2007  . DEPRESSION 01/01/2007  . COMMON MIGRAINE 01/01/2007  . ASTHMA 01/01/2007  . SYNCOPE 01/01/2007    Past Surgical History:  Procedure Laterality Date  . ABDOMINAL HYSTERECTOMY    . BACK SURGERY  07/2009   spinal fusion L 5-S1  . BIOPSY  12/07/2018   Procedure: BIOPSY;  Surgeon: Meridee Score Netty Starring., MD;  Location: [facility];  Service: Gastroenterology;;  . BLADDER SUSPENSION N/A 03/11/2014   Procedure: TRANSVAGINAL TAPE (TVT) PROCEDURE;  Surgeon: Lavina Hamman, MD;  Location: [facility];  Service: Gynecology;  Laterality: N/A;  . CHOLECYSTECTOMY    . ESOPHAGOGASTRODUODENOSCOPY (EGD) WITH PROPOFOL N/A  12/07/2018   Procedure: ESOPHAGOGASTRODUODENOSCOPY (EGD) WITH PROPOFOL;  Surgeon: Meridee Score Netty Starring., MD;  Location: [facility];  Service: Gastroenterology;  Laterality: N/A;  . TONSILLECTOMY AND ADENOIDECTOMY       OB History   No obstetric history on file.     Family History  Problem Relation Age of Onset  .  Liver disease Mother        Fatty liver, NASH  . GI Bleed Mother   . Hypothyroidism Mother   . Hypothyroidism Sister   . Liver disease Sister        fatty liver  . Liver cancer Maternal Grandfather   . Liver disease Maternal Grandfather        fatty liver  . Hypothyroidism Sister   . Liver disease Sister        fatty liver  . Thyroid cancer Sister   . Colon cancer Neg Hx   . Esophageal cancer Neg Hx   . Rectal cancer Neg Hx   . Inflammatory bowel disease Neg Hx   . Pancreatic cancer Neg Hx   . Stomach cancer Neg Hx     Social History   Tobacco Use  . Smoking status: Former Smoker    Packs/day: 0.50    Types: Cigarettes  . Smokeless tobacco: Never Used  Vaping Use  . Vaping Use: Some days  . Substances: Nicotine, Flavoring  Substance Use Topics  . Alcohol use: Not Currently  . Drug use: No    Home Medications Prior to Admission medications   Medication Sig Start Date End Date Taking? Authorizing Provider  albuterol (PROVENTIL) (2.5 MG/3ML) 0.083% nebulizer solution Take 2.5 mg by nebulization every 6 (six) hours as needed for wheezing or shortness of breath.  04/03/19   [provider]  albuterol (VENTOLIN HFA) 108 (90 Base) MCG/ACT inhaler Inhale 1-2 puffs into the lungs every 6 (six) hours as needed for wheezing or shortness of breath. 04/01/19   Hall-Potvin, [place], PA-C  azithromycin (ZITHROMAX) 250 MG tablet Take 1 tablet (250 mg total) by mouth daily. Take first 2 tablets together, then 1 every day until finished. 08/11/20   Wieters, Hallie C, PA-C  clonazePAM (KLONOPIN) 1 MG tablet Take 1 mg by mouth 2 (two) times daily. 06/18/18   [provider]  cyclobenzaprine (FLEXERIL) 10 MG tablet Take 10 mg by mouth at bedtime.    [provider]  dicyclomine (BENTYL) 10 MG capsule Take 1 capsule (10 mg total) by mouth 4 (four) times daily -  before meals and at bedtime. 01/08/20   Mansouraty, Netty Starring., MD  diphenhydrAMINE (BENADRYL) 25 mg  capsule Take 25 mg by mouth at bedtime as needed for allergies.     [provider]  EPIPEN 2-PAK 0.3 MG/0.3ML SOAJ injection Inject 0.3 mLs (0.3 mg total) into the muscle as needed (for allergic reaction). Patient taking differently: Inject 0.3 mg into the muscle daily as needed for anaphylaxis. 10/13/18   Rolly Salter, MD  FLUoxetine (PROZAC) 10 MG tablet Take 10 mg by mouth 2 (two) times daily.    [provider]  fluticasone (FLONASE) 50 MCG/ACT nasal spray Place 1 spray into both nostrils at bedtime.    [provider]  folic acid (FOLVITE) 1 MG tablet Take 1 mg by mouth daily. 09/09/19   [provider]  gabapentin (NEURONTIN) 300 MG capsule Take 600 mg by mouth 2 (two) times daily.    [provider]  hydrOXYzine (VISTARIL) 25 MG capsule Take 25 mg  by mouth 3 (three) times daily as needed for anxiety.    [provider]  montelukast (SINGULAIR) 10 MG tablet Take 10 mg by mouth at bedtime.    [provider]  Naphazoline-Pheniramine (VISINE-A OP) Place 1 drop into both eyes daily as needed (red eyes).    [provider]  omeprazole (PRILOSEC) 40 MG capsule Take 1 capsule (40 mg total) by mouth 2 (two) times daily at 8 am and 10 pm. Patient taking differently: Take 40 mg by mouth daily. 01/09/19   Mansouraty, Netty Starring., MD  oxyCODONE-acetaminophen (PERCOCET) 10-325 MG tablet Take 1 tablet by mouth every 8 (eight) hours as needed for pain.    [provider]  promethazine (PHENERGAN) 25 MG tablet Take 1 tablet (25 mg total) by mouth every 6 (six) hours as needed. 05/20/20   Fayrene Helper, PA-C  rizatriptan (MAXALT) 10 MG tablet Take 10 mg by mouth as needed for migraine. May repeat in 2 hours if needed    [provider]  rosuvastatin (CRESTOR) 20 MG tablet Take 20 mg by mouth daily. 05/11/20   [provider]  TRANSDERM-SCOP 1 MG/3DAYS PLACE 1 PATCH ONTO THE SKIN EVERY 3 DAYS. Patient not taking: No  sig reported 05/18/20   Mansouraty, Netty Starring., MD  Vitamin D, Ergocalciferol, (DRISDOL) 1.25 MG (50000 UNIT) CAPS capsule Take 50,000 Units by mouth once a week. 09/09/19   [provider]    Allergies    Loratadine, Wasp venom, Sumatriptan, Tape, Doxycycline, Ibuprofen, and Methocarbamol  Review of Systems   Review of Systems  Constitutional: Negative for chills, diaphoresis and fever.  Respiratory: Negative for cough and shortness of breath.   Cardiovascular: Negative for chest pain.  Gastrointestinal: Positive for abdominal pain and nausea. Negative for blood in stool, diarrhea and vomiting.  Genitourinary: Negative for difficulty urinating, dysuria and hematuria.  Musculoskeletal: Negative for back pain.  Neurological: Negative for syncope and weakness.  All other systems reviewed and are negative.   Physical Exam Updated Vital Signs BP 123/90   Pulse 95   Temp 98.2 F (36.8 C) (Oral)   Resp 18   Ht 5\' 7"  (1.702 m)   Wt 104.3 kg   SpO2 99%   BMI 36.02 kg/m   Physical Exam Vitals and nursing note reviewed.  Constitutional:      General: She is not in acute distress.    Appearance: She is well-developed. She is obese. She is not diaphoretic.  HENT:     Head: Normocephalic and atraumatic.     Mouth/Throat:     Mouth: Mucous membranes are moist.     Pharynx: Oropharynx is clear.  Eyes:     Conjunctiva/sclera: Conjunctivae normal.  Cardiovascular:     Rate and Rhythm: Normal rate and regular rhythm.     Pulses: Normal pulses.          Radial pulses are 2+ on the right side and 2+ on the left side.       Posterior tibial pulses are 2+ on the right side and 2+ on the left side.     Comments: Tactile temperature in the extremities appropriate and equal bilaterally. Pulmonary:     Effort: Pulmonary effort is normal. No respiratory distress.     Breath sounds: Normal breath sounds.  Abdominal:     Palpations: Abdomen is soft.     Tenderness: There is abdominal  tenderness. There is no guarding.    Musculoskeletal:     Cervical back: Neck  supple.     Right lower leg: No edema.     Left lower leg: No edema.  Lymphadenopathy:     Cervical: No cervical adenopathy.  Skin:    General: Skin is warm and dry.  Neurological:     Mental Status: She is alert.  Psychiatric:        Mood and Affect: Mood and affect normal.        Speech: Speech normal.        Behavior: Behavior normal.     ED Results / Procedures / Treatments   Labs (all labs ordered are listed, but only abnormal results are displayed) Labs Reviewed  CBC WITH DIFFERENTIAL/PLATELET - Abnormal; Notable for the following components:      Result Value   WBC 11.3 (*)    Neutro Abs 8.0 (*)    All other components within normal limits  COMPREHENSIVE METABOLIC PANEL - Abnormal; Notable for the following components:   Glucose, Bld 111 (*)    All other components within normal limits  LIPASE, BLOOD  URINALYSIS, ROUTINE W REFLEX MICROSCOPIC  I-STAT BETA HCG BLOOD, ED ([facility], [facility], [facility] ONLY)    EKG None  Radiology US Abdomen Limited  Result Date: 07/30/2020 CLINICAL DATA:  Right upper quadrant pain EXAM: ULTRASOUND ABDOMEN LIMITED RIGHT UPPER QUADRANT COMPARISON:  January 15, 2019 ultrasound right upper quadrant; CT abdomen and pelvis September 15, 2019 FINDINGS: Gallbladder: Surgically absent. Common bile duct: Diameter: 3 mm. No intrahepatic or extrahepatic biliary duct dilatation. Liver: No focal lesion identified. Liver echogenicity is increased diffusely. Portal vein is patent on color Doppler imaging with normal direction of blood flow towards the liver. Other: None. IMPRESSION: 1.  Gallbladder absent. 2. Diffuse increase in liver echogenicity, a finding indicative of hepatic steatosis. No focal liver lesions evident. Note that the sensitivity of ultrasound for detection of focal liver lesions is diminished in this circumstance. Electronically Signed   By: Bretta BangWilliam  Woodruff III M.D.   On:  07/30/2020 18:01    Procedures Procedures   Medications Ordered in ED Medications  ondansetron (ZOFRAN-ODT) disintegrating tablet 4 mg (4 mg Oral Given 07/30/20 2313)    ED Course  I have reviewed the triage vital signs and the nursing notes.  Pertinent labs & imaging results that were available during my care of the patient were reviewed by me and considered in my medical decision making (see chart for details).    MDM Rules/Calculators/A&P                          Patient presents with abdominal pain for the past 2 days. Patient is nontoxic appearing, afebrile, not on my evaluation tachycardic, not tachypneic, not hypotensive, maintains excellent SPO2 on room air, and is in no apparent distress.   I have reviewed the patient's chart to obtain more information.   I reviewed and interpreted the patient's labs and available radiological studies.  End of shift patient care handoff report given to [employer] Energyob Browning, PA-C. Plan: Pending CT of the abdomen.  If unremarkable, patient can likely be discharged with GI follow-up.    Final Clinical Impression(s) / ED Diagnoses Final diagnoses:  None    Rx / DC Orders ED Discharge Orders    None       Concepcion LivingJoy, Berklee Battey C, PA-C 07/31/20 0010    Mancel BaleWentz, Elliott, MD 08/02/20 1559

## 2020-07-30 NOTE — Telephone Encounter (Signed)
Inbound call from patient following up on her phone call last night. States when she starts pushing fluids, eating, and getting up that's when pain starts and stay nauseous. Patient states she picked up medication sent to [facility]. Says it helped about the same as always does but not much relief. Best contact number [phone]

## 2020-07-30 NOTE — ED Provider Notes (Signed)
Emergency Medicine Provider Triage Evaluation Note  Angie Brown , a 40 y.o. female  was evaluated in triage.  Pt complains of 3 days of right side pain, feels like liver is swollen. Call to her GI yesterday and tried to avoid coming to the ER. Sees GI for hepatitis in past. Nausea, taking Phenergan, last had a 1PM today. No vomiting, no fevers.   Review of Systems  Positive: Abdominal pain, nausea  Negative: Vomiting, fever, changes in bowel or bladder habits  Physical Exam  There were no vitals taken for this visit. Gen:   Awake, no distress   Resp:  Normal effort  MSK:   Moves extremities without difficulty  Other:    Medical Decision Making  Medically screening exam initiated at 4:21 PM.  Appropriate orders placed.  Angie Brown was informed that the remainder of the evaluation will be completed by another provider, this initial triage assessment does not replace that evaluation, and the importance of remaining in the ED until their evaluation is complete.     Jeannie Fend, PA-C 07/30/20 1624    Mancel Bale, MD 08/02/20 [phone]

## 2020-07-30 NOTE — ED Triage Notes (Signed)
Patient states "my liver is swollen."  patient tc/o nausea and states no relief with prescribed Phenergan. Patient states she last took at Phenergan.

## 2020-07-30 NOTE — Telephone Encounter (Signed)
I spoke with the pt and she continues to feel the same, nausea and abd pain and bloating.  She has followed Dr Lauro Franklin recommendations without much relief. She has been advised that Dr Meridee Score would like her to have labs today. She is going to try and come in if she can get a ride.  She states if not she will go to the ED.  Labs have been entered

## 2020-07-31 ENCOUNTER — Emergency Department (HOSPITAL_COMMUNITY): Payer: Medicaid Other

## 2020-07-31 MED ORDER — KETOROLAC TROMETHAMINE 60 MG/2ML IM SOLN
30.0000 mg | Freq: Once | INTRAMUSCULAR | Status: AC
  Administered 2020-07-31: 30 mg via INTRAMUSCULAR

## 2020-07-31 MED ORDER — KETOROLAC TROMETHAMINE 30 MG/ML IJ SOLN
30.0000 mg | Freq: Once | INTRAMUSCULAR | Status: DC
  Filled 2020-07-31: qty 1

## 2020-07-31 NOTE — ED Provider Notes (Signed)
1:04 AM Patient reassessed.  Pain improved. CT shows no acute emergent condition.  Appears stable for discharge and outpatient follow-up.   Roxy Horseman, PA-C 07/31/20 0104    Glynn Octave, MD 07/31/20 [phone]

## 2020-07-31 NOTE — ED Notes (Addendum)
Pt transported to CT. Ambulatory.

## 2020-08-17 ENCOUNTER — Ambulatory Visit: Payer: Medicaid Other | Admitting: Gastroenterology

## 2020-08-17 ENCOUNTER — Encounter: Payer: Self-pay | Admitting: Gastroenterology

## 2020-08-17 VITALS — BP 110/80 | HR 111 | Ht 67.0 in | Wt 235.0 lb

## 2020-08-17 DIAGNOSIS — K649 Unspecified hemorrhoids: Secondary | ICD-10-CM

## 2020-08-17 DIAGNOSIS — K625 Hemorrhage of anus and rectum: Secondary | ICD-10-CM

## 2020-08-17 DIAGNOSIS — E756 Lipid storage disorder, unspecified: Secondary | ICD-10-CM

## 2020-08-17 DIAGNOSIS — K644 Residual hemorrhoidal skin tags: Secondary | ICD-10-CM

## 2020-08-17 DIAGNOSIS — R1011 Right upper quadrant pain: Secondary | ICD-10-CM | POA: Diagnosis not present

## 2020-08-17 DIAGNOSIS — R634 Abnormal weight loss: Secondary | ICD-10-CM

## 2020-08-17 DIAGNOSIS — K642 Third degree hemorrhoids: Secondary | ICD-10-CM | POA: Diagnosis not present

## 2020-08-17 DIAGNOSIS — K5900 Constipation, unspecified: Secondary | ICD-10-CM

## 2020-08-17 DIAGNOSIS — E8809 Other disorders of plasma-protein metabolism, not elsewhere classified: Secondary | ICD-10-CM

## 2020-08-17 NOTE — Progress Notes (Signed)
GASTROENTEROLOGY OUTPATIENT CLINIC VISIT   Primary Care Provider Laqueta Due., MD [address] [place] [address] [phone]  Patient Profile: Angie Brown is a 40 y.o. 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  The patient presents to the [facility] for an evaluation and management of problem(s) noted below:  Problem List 1. Grade III hemorrhoids   2. External hemorrhoids   3. Bright red blood per rectum   4. RUQ pain   5. Unintentional weight loss   6. Constipation, unspecified constipation type   7. Alpha galactosidase deficiency     History of Present Illness Please see initial consultation note and prior progress notes for full details of HPI.  Interval History The patient returns for a follow-up.  She was recently evaluated in the emergency department for recurrent abdominal pain/nausea/vomiting.  Cross-sectional imaging was unremarkable.  She called our office for further insights and had some medication thoughts for her nausea/vomiting.  The patient has had some improvement since that visit but is still having issues.  She continues to deal with constipation.  She has had bright red blood per rectum and feels that her hemorrhoids are causing her more issues at this time.  She has used over-the-counter medications with some effect.  Bright red blood per rectum occurs at least 2 times per week.  She continues to use her PPI therapy.  She remains on her antiemetics and scopolamine patches.  She has had recurrent right upper quadrant abdominal discomfort that continues to be reminiscent to her of her gallbladder pains though imaging has not shown a dilated duct.    The patient denies any issues with  jaundice, scleral icterus, generalized pruritus, darkened/amber urine, clay-colored stools, LE edema, abdominal distention, confusion.  The patient has experienced a weight gain.  Her psychiatrist has adjusted some of her medications in an effort of trying to help with her pain she is on a new medication for the last few weeks though she does not know if it is helping with any of her abdominal discomforts.  GI Review of Systems Positive as above including continued bloating and abdominal distention Negative for dysphagia, odynophagia, coffee-ground emesis, hematemesis  Review of Systems General: Denies fevers/chills Cardiovascular: Denies chest pain Pulmonary: Denies shortness of breath Gastroenterological: See HPI Genitourinary: Denies darkened urine Hematological: Denies easy bruising/bleeding Dermatological: Denies jaundice Psychological: Mood is anxious to find an answer to her issues   Medications Current Outpatient Medications  Medication Sig Dispense Refill  . albuterol (PROVENTIL) (2.5 MG/3ML) 0.083% nebulizer solution Take 2.5 mg by nebulization every 6 (six) hours as needed for wheezing or shortness of breath.     Marland Kitchen albuterol (VENTOLIN HFA) 108 (90 Base) MCG/ACT inhaler Inhale 1-2 puffs into the lungs every 6 (six) hours as needed for wheezing or shortness of breath. 18 g 0  . clonazePAM (KLONOPIN) 1 MG tablet Take 1 mg by mouth 2 (two) times daily.    Marland Kitchen dicyclomine (BENTYL) 10 MG capsule Take 1 capsule (10 mg total) by mouth 4 (four) times daily -  before meals and at bedtime. 90 capsule 3  . diphenhydrAMINE (BENADRYL) 25 mg capsule Take 25 mg by mouth at bedtime as needed for allergies.     Marland Kitchen EPIPEN 2-PAK 0.3 MG/0.3ML SOAJ injection Inject 0.3 mLs (0.3 mg total) into the muscle as  needed (for allergic reaction). (Patient taking differently: Inject 0.3 mg into the muscle daily as needed for anaphylaxis.) 2 each 0  . fluticasone (FLONASE) 50 MCG/ACT nasal spray Place 1 spray into  both nostrils at bedtime.    . folic acid (FOLVITE) 1 MG tablet Take 1 mg by mouth daily.    . hydrOXYzine (VISTARIL) 25 MG capsule Take 25 mg by mouth 3 (three) times daily as needed for anxiety.    . montelukast (SINGULAIR) 10 MG tablet Take 10 mg by mouth at bedtime.    Marland Kitchen omeprazole (PRILOSEC) 40 MG capsule Take 1 capsule (40 mg total) by mouth 2 (two) times daily at 8 am and 10 pm. (Patient taking differently: Take 40 mg by mouth daily.) 60 capsule 2  . promethazine (PHENERGAN) 25 MG tablet Take 1 tablet (25 mg total) by mouth every 6 (six) hours as needed. 15 tablet 0  . rizatriptan (MAXALT) 10 MG tablet Take 10 mg by mouth as needed for migraine. May repeat in 2 hours if needed    . rosuvastatin (CRESTOR) 20 MG tablet Take 20 mg by mouth daily.    . TRANSDERM-SCOP 1 MG/3DAYS PLACE 1 PATCH ONTO THE SKIN EVERY 3 DAYS. 10 patch 4  . Vitamin D, Ergocalciferol, (DRISDOL) 1.25 MG (50000 UNIT) CAPS capsule Take 50,000 Units by mouth once a week.     No current facility-administered medications for this visit.    Allergies Allergies  Allergen Reactions  . Loratadine Shortness Of Breath, Swelling and Palpitations    hypoventilation   . Wasp Venom Anaphylaxis  . Sumatriptan Other (See Comments)    sinus pain, sinus on fire and face hurts  . Tape Other (See Comments)    unknown  . Doxycycline Nausea And Vomiting  . Ibuprofen Nausea And Vomiting    GI upset  . Methocarbamol Rash    Histories Past Medical History:  Diagnosis Date  . Allergy    Wasps  . Asthma   . Bronchitis   . Depression   . Dysrhythmia    PVCs- stress related  . Hypoglycemia   . Hypothyroidism   . PONV (postoperative nausea and vomiting)    pt prefers scop patch  . Renal disorder    2008   Past Surgical History:  Procedure Laterality Date  . ABDOMINAL HYSTERECTOMY    . BACK SURGERY  07/2009   spinal fusion L 5-S1  . BIOPSY  12/07/2018   Procedure: BIOPSY;  Surgeon: Meridee Score Netty Starring., MD;  Location:  [facility];  Service: Gastroenterology;;  . BLADDER SUSPENSION N/A 03/11/2014   Procedure: TRANSVAGINAL TAPE (TVT) PROCEDURE;  Surgeon: Lavina Hamman, MD;  Location: [facility];  Service: Gynecology;  Laterality: N/A;  . CHOLECYSTECTOMY    . ESOPHAGOGASTRODUODENOSCOPY (EGD) WITH PROPOFOL N/A 12/07/2018   Procedure: ESOPHAGOGASTRODUODENOSCOPY (EGD) WITH PROPOFOL;  Surgeon: Meridee Score Netty Starring., MD;  Location: [facility];  Service: Gastroenterology;  Laterality: N/A;  . TONSILLECTOMY AND ADENOIDECTOMY     Social History   Socioeconomic History  . Marital status: Divorced    Spouse name: Not on file  . Number of children: 3  . Years of education: Not on file  . Highest education level: Not on file  Occupational History  . Occupation: housewife  Tobacco Use  . Smoking status: Former Smoker    Packs/day: 0.50    Types: Cigarettes  . Smokeless tobacco: Never Used  Vaping Use  . Vaping Use: Some days  . Substances: Nicotine, Flavoring  Substance and Sexual  Activity  . Alcohol use: Not Currently  . Drug use: No  . Sexual activity: Yes    Partners: Male    Birth control/protection: Surgical  Other Topics Concern  . Not on file  Social History Narrative  . Not on file   Social Determinants of Health   Financial Resource Strain: Not on file  Food Insecurity: Not on file  Transportation Needs: Not on file  Physical Activity: Not on file  Stress: Not on file  Social Connections: Not on file  Intimate Partner Violence: Not on file   Family History  Problem Relation Age of Onset  . Liver disease Mother        Fatty liver, NASH  . GI Bleed Mother   . Hypothyroidism Mother   . Hypothyroidism Sister   . Liver disease Sister        fatty liver  . Liver cancer Maternal Grandfather   . Liver disease Maternal Grandfather        fatty liver  . Hypothyroidism Sister   . Liver disease Sister        fatty liver  . Thyroid cancer Sister   . Colon cancer Neg Hx   . Esophageal  cancer Neg Hx   . Rectal cancer Neg Hx   . Inflammatory bowel disease Neg Hx   . Pancreatic cancer Neg Hx   . Stomach cancer Neg Hx    I have reviewed her medical, social, and family history in detail and updated the electronic medical record as necessary.    PHYSICAL EXAMINATION  BP 110/80   Pulse (!) 111   Ht 5\' 7"  (1.702 m)   Wt 235 lb (106.6 kg)   BMI 36.81 kg/m  Wt Readings from Last 3 Encounters:  08/17/20 235 lb (106.6 kg)  07/30/20 230 lb (104.3 kg)  05/20/20 227 lb (103 kg)  GEN: NAD, appears stated age, doesn't appear chronically ill PSYCH: Cooperative, without pressured speech EYE: Conjunctivae pink, sclerae anicteric ENT: Masked CV: Tachycardic without rubs or gallops RESP: No audible wheezing GI: NABS, soft, rounded, obese, tenderness to palpation in MEG and RUQ, mild volitional guarding with negative Carnett's sign, no rebound MSK/EXT: Bilateral pedal edema present SKIN: No jaundice, no spider angiomata NEURO:  Alert & Oriented x 3, no focal deficits   REVIEW OF DATA  I reviewed the following data at the time of this encounter:  GI Procedures and Studies  Previously reviewed  Laboratory Studies  Reviewed those in epic  Imaging Studies  No new imaging to review   ASSESSMENT  Ms. Roulhac is a 40 y.o. 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  The patient is seen today for evaluation and management of:  1. Grade III hemorrhoids   2. External hemorrhoids   3. Bright red blood per rectum   4. RUQ pain   5. Unintentional weight loss   6. Constipation, unspecified constipation type   7. Alpha galactosidase deficiency    The patient is hemodynamically stable.  However, clinically she continues to experience issues.  She is  had more significant issues with bright red blood per rectum and I suspect that this is a combination of her internal hemorrhoidal disease but potentially also her external hemorrhoids.  I think it is worth considering internal hemorrhoidal banding after discussion with the patient.  I will get her set  up with a clinic visit and hemorrhoidal banding visit with one of my partners.  We did discuss that sometimes banding internal hemorrhoids can significantly improve bright red blood per rectum although if the bleeding is occurring from the external hemorrhoidal tags then she understands that internal hemorrhoidal banding may not completely allow cessation of bleeding at which point she may need to be seen by our colorectal surgeons.  Patient's recurrent right upper quadrant discomfort is not clearly defined.  I think an upper endoscopic ultrasound to ensure that there is no retained stones is reasonable for this patient and will also allow [place] to reevaluate the stomach lining for peptic ulcer disease since we know that small stones less than 5 mm in size can be missed on MRI/MRCP.  Thankfully, her liver enzymes and her pancreatic enzymes remain in the normal range.  We did previously evaluate her for alpha gal enzyme activity and found that she might have a deficiency in this.  I wonder if as a heterozygote, some females may have symptoms of alpha gal enzyme deficiency that can lead to chronic abdominal pain and irritable bowel syndrome like symptoms in some individuals.  This could be causing her some issues but we will need to think about this further.  It is not clear that she has true alpha gal red meat allergy although she does note some worsening of symptoms by having red meats so she will try to minimize the intake of that as able.  We will need to consider a gastric emptying study if her symptoms persist.  Toileting techniques were discussed with her and Linzess samples were given to her in an effort of trying  to optimize her bowel habits and evacuation.  She will let us know if she has significant Improvement at which point we can send a prescription in.    The risks of an EUS including intestinal perforation, bleeding, infection, aspiration, and medication effects were discussed as was the possibility it may not give a definitive diagnosis if a biopsy is performed.  When a biopsy of the pancreas is done as part of the EUS, there is an additional risk of pancreatitis at the rate of about 1-2%.  It was explained that procedure related pancreatitis is typically mild, although it can be severe and even life threatening, which is why we do not perform random pancreatic biopsies and only biopsy a lesion/area we feel is concerning enough to warrant the risk.  The risks and benefits of endoscopic evaluation were discussed with the patient; these include but are not limited to the risk of perforation, infection, bleeding, missed lesions, lack of diagnosis, severe illness requiring hospitalization, as well as anesthesia and sedation related illnesses.  The patient is agreeable to proceed.  All patient questions were answered to the best of my ability, and the patient agrees to the aforementioned plan of action with follow-up as indicated.   PLAN  Proceed with scheduling EGD/EUS to rule out microcholedocholithiasis Consider gastric emptying study in future Patient working on weight management as able Continue working with psychiatry although we may want to consider the use of a TCA at some point in future we will have to get approval from psychiatry since she has recently been initiated on Cymbalta and we would not want to cause issues if we were to consider low-dose TCA Phenergan as needed Scopolamine patches as needed Consider SIBO breath testing in future Toileting techniques discussed with patient Continue Colace 200 mg daily Linzess 145 mcg samples  given to patient and if effective will give  prescription Hemorrhoidal banding visit/evaluation by Dr. Adela LankArmbruster in coming weeks for internal hemorrhoidal banding but if this fails then colorectal surgery referral for external hemorrhoidectomy can be considered   Orders Placed This Encounter  Procedures  . Procedural/ Surgical Case Request: UPPER ESOPHAGEAL ENDOSCOPIC ULTRASOUND (EUS)  . Ambulatory referral to Gastroenterology  . Ambulatory referral to Allergy    New Prescriptions   No medications on file   Modified Medications   No medications on file    Planned Follow Up No follow-ups on file.   Total Time in Face-to-Face and in Coordination of Care for patient including independent/personal interpretation/review of prior testing, medical history, examination, medication adjustment, communicating results with the patient directly, and documentation with the EHR is 30 minutes.   Corliss ParishGabriel Mansouraty, MD [facility] Office # [phone]

## 2020-08-17 NOTE — Patient Instructions (Addendum)
You have been scheduled for an endoscopy. Please follow written instructions given to you at your visit today. If you use inhalers (even only as needed), please bring them with you on the day of your procedure.  Continue Colace 200mg .   You have been given a sample of Linzess - Take 1 capsule by mouth once daily. Call office if Linzess works and we will send prescriptions   If you are age 41 or younger, your body mass index should be between 19-25. Your Body mass index is 36.81 kg/m. If this is out of the aformentioned range listed, please consider follow up with your Primary Care Provider.   __________________________________________________________  The [facility] providers would like to encourage you to use [facility] to communicate with providers for non-urgent requests or questions.  Due to long hold times on the telephone, sending your provider a message by [facility] may be a faster and more efficient way to get a response.  Please allow 48 business hours for a response.  Please remember that this is for non-urgent requests.   You have a hemorrhoid banding appointment with Dr. [facility] on: 10/12/20 @4 :00pm  Please see [facility] Handout.   We will contact your psychiatrist: 10/14/20 NP [phone] regarding a new medication. Dr. [name] will reach out to you after he has spoken with her.   You have been referred to an Allergist- [facility]. Please see information on AVS for contact and address information. If you have not heard from them in 1-2 weeks please contact office at [phone].   Thank you for choosing me and [facility].  Dr. Waterford

## 2020-08-20 ENCOUNTER — Encounter: Payer: Self-pay | Admitting: Gastroenterology

## 2020-08-20 DIAGNOSIS — E8809 Other disorders of plasma-protein metabolism, not elsewhere classified: Secondary | ICD-10-CM | POA: Insufficient documentation

## 2020-08-20 DIAGNOSIS — K625 Hemorrhage of anus and rectum: Secondary | ICD-10-CM | POA: Insufficient documentation

## 2020-08-20 DIAGNOSIS — R634 Abnormal weight loss: Secondary | ICD-10-CM | POA: Insufficient documentation

## 2020-08-20 DIAGNOSIS — K644 Residual hemorrhoidal skin tags: Secondary | ICD-10-CM | POA: Insufficient documentation

## 2020-08-20 DIAGNOSIS — K5900 Constipation, unspecified: Secondary | ICD-10-CM | POA: Insufficient documentation

## 2020-08-20 DIAGNOSIS — E756 Lipid storage disorder, unspecified: Secondary | ICD-10-CM | POA: Insufficient documentation

## 2020-08-20 DIAGNOSIS — R1011 Right upper quadrant pain: Secondary | ICD-10-CM | POA: Insufficient documentation

## 2020-08-30 ENCOUNTER — Telehealth: Payer: Self-pay | Admitting: Gastroenterology

## 2020-08-30 NOTE — Telephone Encounter (Signed)
Inbound call from patient calling in to inform that linzess samples are working and would like a prescription sent to [facility] on [address] in [place]. States she is Medicaid patient so will probably need prior authorization.

## 2020-08-31 MED ORDER — LINACLOTIDE 145 MCG PO CAPS: 145.0000 ug | ORAL_CAPSULE | Freq: Every day | ORAL | 2 refills | Status: DC

## 2020-08-31 NOTE — Telephone Encounter (Signed)
Prescription for Linzess - once daily sent to [facility] . Pt has been informed.

## 2020-09-16 ENCOUNTER — Ambulatory Visit: Payer: Medicaid Other | Admitting: Allergy

## 2020-09-27 ENCOUNTER — Telehealth: Payer: Self-pay | Admitting: Gastroenterology

## 2020-09-27 ENCOUNTER — Other Ambulatory Visit: Payer: Self-pay | Admitting: Gastroenterology

## 2020-09-27 NOTE — Telephone Encounter (Signed)
Please advise 

## 2020-09-27 NOTE — Telephone Encounter (Signed)
Inbound call from patient stating omeprazole is no longer helping and is requesting alternate medication be sent to the pharmacy.  Please advise.

## 2020-09-27 NOTE — Telephone Encounter (Signed)
Let us transition to Dexilant 30 mg daily. I see the patient has an upcoming appointment with Dr. Adela Lank, if she transitioning to Dr. Adela Lank or was this a mistake? Thanks. GM

## 2020-09-28 MED ORDER — DEXLANSOPRAZOLE 30 MG PO CPDR: 30.0000 mg | DELAYED_RELEASE_CAPSULE | Freq: Every day | ORAL | 3 refills | Status: DC

## 2020-09-28 NOTE — Telephone Encounter (Signed)
Left message for patient to return call to discuss medication changes.  Will continue efforts.   Patient is seeing Dr Adela Lank for hemorrhoid banding only in July.

## 2020-09-28 NOTE — Addendum Note (Signed)
Addended by: Lamona Curl on: 09/28/2020 04:43 PM   Modules accepted: Orders

## 2020-09-28 NOTE — Addendum Note (Signed)
Addended by: Lamona Curl on: 09/28/2020 04:44 PM   Modules accepted: Orders

## 2020-10-05 ENCOUNTER — Telehealth: Payer: Self-pay | Admitting: Gastroenterology

## 2020-10-05 ENCOUNTER — Telehealth (HOSPITAL_COMMUNITY): Payer: Self-pay

## 2020-10-05 NOTE — Telephone Encounter (Signed)
Inbound call from pt stating that her medication Dexlansoprazole needs a prior approval before her insurance will cover. Please advise. Thanks.

## 2020-10-12 ENCOUNTER — Encounter: Payer: Medicaid Other | Admitting: Gastroenterology

## 2020-10-12 NOTE — Telephone Encounter (Signed)
Received approval from [facility] for Dexlansoprazole  case# [id number]. Pt has been informed.

## 2020-10-12 NOTE — Telephone Encounter (Signed)
PA is still pending in Covermy meds for Scopolamine and Dexilant. I did reach to pt to let her know that it was still pending.

## 2020-10-13 NOTE — Telephone Encounter (Signed)
Received fax from [facility] -Scopolamine patches have been denied because patient has only tried promethazine and Scopolamine is not covered under pt's plan. They do indicate that they will pay for Transderm-Scop Patch.   Dr. Meridee Score- okay to change prescription to Transderm-Scop Patch?

## 2020-10-13 NOTE — Telephone Encounter (Signed)
Okay to use transdermal scopolamine patch. Same instructions to remove every 72 hours. Thanks. GM

## 2020-10-14 MED ORDER — TRANSDERM-SCOP (1.5 MG) 1 MG/3DAYS TD PT72: 1.0000 | MEDICATED_PATCH | TRANSDERMAL | 12 refills | Status: DC

## 2020-10-14 NOTE — Telephone Encounter (Signed)
Pt has been informed that Dexilant has been approved and Trans-Scop Patch (brand) has been sent to her pharmacy.

## 2020-10-25 ENCOUNTER — Ambulatory Visit (HOSPITAL_COMMUNITY): Payer: Medicaid Other | Admitting: Certified Registered"

## 2020-10-25 ENCOUNTER — Ambulatory Visit (HOSPITAL_COMMUNITY)
Admission: RE | Admit: 2020-10-25 | Discharge: 2020-10-25 | Disposition: A | Discharge status: Home / Self Care | Payer: Medicaid Other | Attending: Gastroenterology | Admitting: Gastroenterology

## 2020-10-25 ENCOUNTER — Encounter (HOSPITAL_COMMUNITY): Payer: Self-pay | Admitting: Gastroenterology

## 2020-10-25 ENCOUNTER — Encounter (HOSPITAL_COMMUNITY)
Admission: RE | Disposition: A | Discharge status: Home / Self Care | Payer: Self-pay | Source: Home / Self Care | Attending: Gastroenterology

## 2020-10-25 DIAGNOSIS — K869 Disease of pancreas, unspecified: Secondary | ICD-10-CM | POA: Diagnosis not present

## 2020-10-25 DIAGNOSIS — R112 Nausea with vomiting, unspecified: Secondary | ICD-10-CM | POA: Diagnosis present

## 2020-10-25 DIAGNOSIS — Z886 Allergy status to analgesic agent status: Secondary | ICD-10-CM | POA: Diagnosis not present

## 2020-10-25 DIAGNOSIS — E756 Lipid storage disorder, unspecified: Secondary | ICD-10-CM

## 2020-10-25 DIAGNOSIS — R634 Abnormal weight loss: Secondary | ICD-10-CM

## 2020-10-25 DIAGNOSIS — Z8349 Family history of other endocrine, nutritional and metabolic diseases: Secondary | ICD-10-CM | POA: Diagnosis not present

## 2020-10-25 DIAGNOSIS — K5900 Constipation, unspecified: Secondary | ICD-10-CM

## 2020-10-25 DIAGNOSIS — Z87891 Personal history of nicotine dependence: Secondary | ICD-10-CM | POA: Diagnosis not present

## 2020-10-25 DIAGNOSIS — Z800 Family history of malignant neoplasm of digestive organs: Secondary | ICD-10-CM | POA: Insufficient documentation

## 2020-10-25 DIAGNOSIS — K2970 Gastritis, unspecified, without bleeding: Secondary | ICD-10-CM | POA: Diagnosis not present

## 2020-10-25 DIAGNOSIS — Z888 Allergy status to other drugs, medicaments and biological substances status: Secondary | ICD-10-CM | POA: Diagnosis not present

## 2020-10-25 DIAGNOSIS — Z881 Allergy status to other antibiotic agents status: Secondary | ICD-10-CM | POA: Diagnosis not present

## 2020-10-25 DIAGNOSIS — R1011 Right upper quadrant pain: Secondary | ICD-10-CM

## 2020-10-25 DIAGNOSIS — K3189 Other diseases of stomach and duodenum: Secondary | ICD-10-CM | POA: Diagnosis not present

## 2020-10-25 DIAGNOSIS — Z91048 Other nonmedicinal substance allergy status: Secondary | ICD-10-CM | POA: Diagnosis not present

## 2020-10-25 DIAGNOSIS — Z91030 Bee allergy status: Secondary | ICD-10-CM | POA: Insufficient documentation

## 2020-10-25 DIAGNOSIS — E8809 Other disorders of plasma-protein metabolism, not elsewhere classified: Secondary | ICD-10-CM

## 2020-10-25 DIAGNOSIS — K642 Third degree hemorrhoids: Secondary | ICD-10-CM

## 2020-10-25 DIAGNOSIS — Z8379 Family history of other diseases of the digestive system: Secondary | ICD-10-CM | POA: Diagnosis not present

## 2020-10-25 HISTORY — PX: ESOPHAGOGASTRODUODENOSCOPY (EGD) WITH PROPOFOL: SHX5813

## 2020-10-25 HISTORY — DX: Family history of other specified conditions: Z84.89

## 2020-10-25 HISTORY — PX: UPPER ESOPHAGEAL ENDOSCOPIC ULTRASOUND (EUS): SHX6562

## 2020-10-25 HISTORY — PX: BIOPSY: SHX5522

## 2020-10-25 SURGERY — UPPER ESOPHAGEAL ENDOSCOPIC ULTRASOUND (EUS)
Anesthesia: Monitor Anesthesia Care

## 2020-10-25 MED ORDER — PROPOFOL 1000 MG/100ML IV EMUL
INTRAVENOUS | Status: AC
  Filled 2020-10-25: qty 100

## 2020-10-25 MED ORDER — PROPOFOL 10 MG/ML IV BOLUS
INTRAVENOUS | Status: DC | PRN
  Administered 2020-10-25: 30 mg via INTRAVENOUS
  Administered 2020-10-25 (×3): 20 mg via INTRAVENOUS

## 2020-10-25 MED ORDER — PROPOFOL 500 MG/50ML IV EMUL
INTRAVENOUS | Status: AC
  Filled 2020-10-25: qty 50

## 2020-10-25 MED ORDER — PROPOFOL 500 MG/50ML IV EMUL
INTRAVENOUS | Status: DC | PRN
  Administered 2020-10-25: 125 ug/kg/min via INTRAVENOUS

## 2020-10-25 MED ORDER — MIDAZOLAM HCL 5 MG/5ML IJ SOLN
INTRAMUSCULAR | Status: DC | PRN
  Administered 2020-10-25 (×2): 1 mg via INTRAVENOUS

## 2020-10-25 MED ORDER — AMISULPRIDE (ANTIEMETIC) 5 MG/2ML IV SOLN
10.0000 mg | Freq: Once | INTRAVENOUS | Status: AC
  Administered 2020-10-25: 10 mg via INTRAVENOUS
  Filled 2020-10-25: qty 4

## 2020-10-25 MED ORDER — LACTATED RINGERS IV SOLN: Freq: Once | INTRAVENOUS | Status: AC

## 2020-10-25 MED ORDER — SODIUM CHLORIDE 0.9 % IV SOLN: INTRAVENOUS | Status: DC

## 2020-10-25 MED ORDER — LACTATED RINGERS IV SOLN: INTRAVENOUS | Status: DC | PRN

## 2020-10-25 MED ORDER — LIDOCAINE 2% (20 MG/ML) 5 ML SYRINGE
INTRAMUSCULAR | Status: DC | PRN
  Administered 2020-10-25: 60 mg via INTRAVENOUS

## 2020-10-25 MED ORDER — SUCRALFATE 1 GM/10ML PO SUSP: 1.0000 g | Freq: Two times a day (BID) | ORAL | 1 refills | Status: DC

## 2020-10-25 MED ORDER — MIDAZOLAM HCL 2 MG/2ML IJ SOLN
INTRAMUSCULAR | Status: AC
  Filled 2020-10-25: qty 2

## 2020-10-25 NOTE — Op Note (Signed)
[facility] Patient Name: Angie Brown Procedure Date: 10/25/2020 MRN: [id number] Attending MD: Justice Britain , MD Date of Birth: 1980/07/19 CSN: [id number] Age: 40 Admit Type: Outpatient Procedure:                Upper EUS Indications:              Rule out choledocholithiasis, Follow-up of                            gastritis, Nausea with vomiting Providers:                Justice Britain, MD, Carlyn Reichert, RN, Elspeth Cho Tech., Technician, [facility],                            CRNA Referring MD:             Cleone Slim. Furr Medicines:                Monitored Anesthesia Care Complications:            No immediate complications. Estimated Blood Loss:     Estimated blood loss was minimal. Procedure:                Pre-Anesthesia Assessment:                           - Prior to the procedure, a History and Physical                            was performed, and patient medications and                            allergies were reviewed. The patient's tolerance of                            previous anesthesia was also reviewed. The risks                            and benefits of the procedure and the sedation                            options and risks were discussed with the patient.                            All questions were answered, and informed consent                            was obtained. Prior Anticoagulants: The patient has                            taken no previous anticoagulant or antiplatelet                            agents except for  NSAID medication. ASA Grade                            Assessment: II - A patient with mild systemic                            disease. After reviewing the risks and benefits,                            the patient was deemed in satisfactory condition to                            undergo the procedure.                           After obtaining informed consent, the  endoscope was                            passed under direct vision. Throughout the                            procedure, the patient's blood pressure, pulse, and                            oxygen saturations were monitored continuously. The                            [id number]-H190 ([id number]) Olympus endoscope was introduced                            through the mouth, and advanced to the second part                            of duodenum. The [id number]-[id number] ([id number]) Olympus                            duodenoscope was introduced through the mouth, and                            advanced to the area of papilla. The [id number]                            ([id number]) Olympus linear ultrasound scope was                            introduced through the mouth, and advanced to the                            duodenum for ultrasound examination from the                            stomach and duodenum. The upper EUS was  accomplished without difficulty. The patient                            tolerated the procedure. Scope In: Scope Out: Findings:      ENDOSCOPIC FINDING: :      No gross lesions were noted in the entire esophagus.      The Z-line was regular and was found 40 cm from the incisors.      Striped mildly erythematous mucosa without bleeding was found in the       gastric antrum.      No other gross lesions were noted in the entire examined stomach.       Biopsies were taken with a cold forceps for histology and Helicobacter       pylori testing.      No gross lesions were noted in the duodenal bulb, in the first portion       of the duodenum and in the second portion of the duodenum.      ENDOSONOGRAPHIC FINDING: :      The pancreatic duct had a normal endosonographic appearance in the       pancreatic head (0.5 mm -> 0.6 mm), genu of the pancreas (0.4 mm), body       of the pancreas (0.6 mm) and tail of the pancreas (0.4 mm).      Pancreatic parenchymal abnormalities  were noted in the entire pancreas.       These consisted of hyperechoic strands.      Endosonographic imaging in the entire pancreas showed no mass-lesion.      There was no sign of significant endosonographic abnormality in the       common bile duct (2.2 mm -> 2.4 mm) and in the common hepatic duct (4.2       mm). No stones and ducts with regular contour were identified.      Endosonographic imaging of the ampulla showed no extrinsic compression,       intramural (subepithelial) lesion, mass, varices or wall thickening.      Endosonographic imaging in the visualized portion of the liver showed no       mass-lesion.      No malignant-appearing lymph nodes were visualized in the celiac region       (level 20), peripancreatic region and porta hepatis region.      On endosonographic examination, the right adrenal gland appeared       prominent. It measured 11 mm by 15 mm in maximal cross-sectional       diameter.      The celiac region was visualized. Impression:               EGD Impression:                           - No gross lesions in esophagus. Z-line regular, 40                            cm from the incisors.                           - Erythematous mucosa in the antrum. No other gross  lesions in the stomach. Biopsied.                           - No gross lesions in the duodenal bulb, in the                            first portion of the duodenum and in the second                            portion of the duodenum.                           EUS Impression:                           - The pancreatic duct had a normal endosonographic                            appearance in the pancreatic head, genu of the                            pancreas, body of the pancreas and tail of the                            pancreas.                           - Pancreatic parenchymal abnormalities consisting                            of hyperechoic strands were noted in  the entire                            pancreas. But otherwise a normal appearing pancreas                            without cyst/mass/lesion noted.                           - There was no sign of significant pathology in the                            common bile duct and in the common hepatic duct. No                            evidence of choledocholithiasis                           - No malignant-appearing lymph nodes were                            visualized in the celiac region (level 20),                            peripancreatic region and porta  hepatis region.                           - The right adrenal gland appeared prominent on                            endosonographic examination. Moderate Sedation:      Not Applicable - Patient had care per Anesthesia. Recommendation:           - The patient will be observed post-procedure,                            until all discharge criteria are met.                           - Discharge patient to home.                           - Patient has a contact number available for                            emergencies. The signs and symptoms of potential                            delayed complications were discussed with the                            patient. Return to normal activities tomorrow.                            Written discharge instructions were provided to the                            patient.                           - Resume previous diet.                           - Observe patient's clinical course.                           - Continue present medications.                           - Initiate Carafate twice daily (take no other                            medications 1 hour before/after administration).                           - Recommend MRI-Abdomen at patient convenience to                            better define right adrenal and peri-portal region                            (  contrasted would be best).                            - Proceed with SF-GES to further define persistent                            issues of Nausea/Vomiting.                           - The findings and recommendations were discussed                            with the patient.                           - The findings and recommendations were discussed                            with the patient's family. Procedure Code(s):        --- Professional ---                           [phone], Esophagogastroduodenoscopy, flexible,                            transoral; with endoscopic ultrasound examination                            limited to the esophagus, stomach or duodenum, and                            adjacent structures                           43239, Esophagogastroduodenoscopy, flexible,                            transoral; with biopsy, single or multiple Diagnosis Code(s):        --- Professional ---                           K31.89, Other diseases of stomach and duodenum                           K86.9, Disease of pancreas, unspecified                           I89.9, Noninfective disorder of lymphatic vessels                            and lymph nodes, unspecified                           E27.8, Other specified disorders of adrenal gland                           K29.70, Gastritis, unspecified, without bleeding  R11.2, Nausea with vomiting, unspecified CPT copyright 2019 American Medical Association. All rights reserved. The codes documented in this report are preliminary and upon coder review may  be revised to meet current compliance requirements. Justice Britain, MD 10/25/2020 9:48:57 AM Number of Addenda: 0

## 2020-10-25 NOTE — Anesthesia Preprocedure Evaluation (Signed)
Anesthesia Evaluation  Patient identified by MRN, date of birth, ID band Patient awake    Reviewed: Allergy & Precautions, NPO status , Patient's Chart, lab work & pertinent test results  Airway Mallampati: II  TM Distance: <3 FB Neck ROM: Full    Dental no notable dental hx.    Pulmonary neg pulmonary ROS, former smoker,    Pulmonary exam normal breath sounds clear to auscultation       Cardiovascular hypertension, Normal cardiovascular exam Rhythm:Regular Rate:Normal     Neuro/Psych negative neurological ROS  negative psych ROS   GI/Hepatic Neg liver ROS, GERD  ,  Endo/Other  Hypothyroidism Morbid obesity  Renal/GU negative Renal ROS  negative genitourinary   Musculoskeletal negative musculoskeletal ROS (+)   Abdominal   Peds negative pediatric ROS (+)  Hematology negative hematology ROS (+)   Anesthesia Other Findings   Reproductive/Obstetrics negative OB ROS                             Anesthesia Physical Anesthesia Plan  ASA: 3  Anesthesia Plan: MAC   Post-op Pain Management:    Induction: Intravenous  PONV Risk Score and Plan: 2 and Propofol infusion and Treatment may vary due to age or medical condition  Airway Management Planned: Simple Face Mask  Additional Equipment:   Intra-op Plan:   Post-operative Plan:   Informed Consent: I have reviewed the patients History and Physical, chart, labs and discussed the procedure including the risks, benefits and alternatives for the proposed anesthesia with the patient or authorized representative who has indicated his/her understanding and acceptance.     Dental advisory given  Plan Discussed with: CRNA and Surgeon  Anesthesia Plan Comments:         Anesthesia Quick Evaluation

## 2020-10-25 NOTE — Discharge Instructions (Signed)
YOU HAD AN ENDOSCOPIC PROCEDURE TODAY: Refer to the procedure report and other information in the discharge instructions given to you for any specific questions about what was found during the examination. If this information does not answer your questions, please call [facility] office at [phone] to clarify.   YOU SHOULD EXPECT: Some feelings of bloating in the abdomen. Passage of more gas than usual. Walking can help get rid of the air that was put into your GI tract during the procedure and reduce the bloating. If you had a lower endoscopy (such as a colonoscopy or flexible sigmoidoscopy) you may notice spotting of blood in your stool or on the toilet paper. Some abdominal soreness may be present for a day or two, also.  DIET: Your first meal following the procedure should be a light meal and then it is ok to progress to your normal diet. A half-sandwich or bowl of soup is an example of a good first meal. Heavy or fried foods are harder to digest and may make you feel nauseous or bloated. Drink plenty of fluids but you should avoid alcoholic beverages for 24 hours. If you had a esophageal dilation, please see attached instructions for diet.    ACTIVITY: Your care partner should take you home directly after the procedure. You should plan to take it easy, moving slowly for the rest of the day. You can resume normal activity the day after the procedure however YOU SHOULD NOT DRIVE, use power tools, machinery or perform tasks that involve climbing or major physical exertion for 24 hours (because of the sedation medicines used during the test).   SYMPTOMS TO REPORT IMMEDIATELY: A gastroenterologist can be reached at any hour. Please call [phone]  for any of the following symptoms:   Following upper endoscopy (EGD, EUS, ERCP, esophageal dilation) Vomiting of blood or coffee ground material  New, significant abdominal pain  New, significant chest pain or pain under the shoulder blades  Painful or  persistently difficult swallowing  New shortness of breath  Black, tarry-looking or red, bloody stools  FOLLOW UP:  If any biopsies were taken you will be contacted by phone or by letter within the next 1-3 weeks. Call [phone]  if you have not heard about the biopsies in 3 weeks.  Please also call with any specific questions about appointments or follow up tests.  

## 2020-10-25 NOTE — H&P (Signed)
GASTROENTEROLOGY PROCEDURE H&P NOTE   Primary Care Physician: Laqueta Due., MD  HPI: Angie Brown is a 40 y.o. female who presents for EGD/EUS to evaluate persistent nausea/vomiting and ensure no choledocholithiasis.  Past Medical History:  Diagnosis Date   Allergy    Wasps   Asthma    Bronchitis    Depression    Dysrhythmia    PVCs- stress related   Family history of adverse reaction to anesthesia    Hypoglycemia    Hypothyroidism    PONV (postoperative nausea and vomiting)    pt prefers scop patch   Renal disorder    2008   Past Surgical History:  Procedure Laterality Date   ABDOMINAL HYSTERECTOMY     BACK SURGERY  07/2009   spinal fusion L 5-S1   BIOPSY  12/07/2018   Procedure: BIOPSY;  Surgeon: Lemar Lofty., MD;  Location: [facility];  Service: Gastroenterology;;   BLADDER SUSPENSION N/A 03/11/2014   Procedure: TRANSVAGINAL TAPE (TVT) PROCEDURE;  Surgeon: Lavina Hamman, MD;  Location: [facility];  Service: Gynecology;  Laterality: N/A;   CHOLECYSTECTOMY     ESOPHAGOGASTRODUODENOSCOPY (EGD) WITH PROPOFOL N/A 12/07/2018   Procedure: ESOPHAGOGASTRODUODENOSCOPY (EGD) WITH PROPOFOL;  Surgeon: Meridee Score Netty Starring., MD;  Location: [facility];  Service: Gastroenterology;  Laterality: N/A;   TONSILLECTOMY AND ADENOIDECTOMY     Current Facility-Administered Medications  Medication Dose Route Frequency Provider Last Rate Last Admin   0.9 %  sodium chloride infusion   Intravenous Continuous Mansouraty, Netty Starring., MD       Allergies  Allergen Reactions   Loratadine Shortness Of Breath, Swelling and Palpitations    hypoventilation    Wasp Venom Anaphylaxis   Sumatriptan Other (See Comments)    sinus pain, sinus on fire and face hurts   Tape Other (See Comments)    unknown   Doxycycline Nausea And Vomiting   Ibuprofen Nausea And Vomiting    GI upset   Methocarbamol Rash   Family History  Problem Relation Age of Onset   Liver disease Mother         Fatty liver, NASH   GI Bleed Mother    Hypothyroidism Mother    Hypothyroidism Sister    Liver disease Sister        fatty liver   Liver cancer Maternal Grandfather    Liver disease Maternal Grandfather        fatty liver   Hypothyroidism Sister    Liver disease Sister        fatty liver   Thyroid cancer Sister    Colon cancer Neg Hx    Esophageal cancer Neg Hx    Rectal cancer Neg Hx    Inflammatory bowel disease Neg Hx    Pancreatic cancer Neg Hx    Stomach cancer Neg Hx    Social History   Socioeconomic History   Marital status: Divorced    Spouse name: Not on file   Number of children: 3   Years of education: Not on file   Highest education level: Not on file  Occupational History   Occupation: housewife  Tobacco Use   Smoking status: Former    Packs/day: 0.50    Types: Cigarettes   Smokeless tobacco: Never  Vaping Use   Vaping Use: Some days   Substances: Nicotine, Flavoring  Substance and Sexual Activity   Alcohol use: Not Currently   Drug use: No   Sexual activity: Yes    Partners: Male  Birth control/protection: Surgical  Other Topics Concern   Not on file  Social History Narrative   Not on file   Social Determinants of Health   Financial Resource Strain: Not on file  Food Insecurity: Not on file  Transportation Needs: Not on file  Physical Activity: Not on file  Stress: Not on file  Social Connections: Not on file  Intimate Partner Violence: Not on file    Physical Exam: Vital signs in last 24 hours: Temp:  [98.3 F (36.8 C)] 98.3 F (36.8 C) (08/08 0821) Pulse Rate:  [93] 93 (08/08 0821) Resp:  [9] 9 (08/08 0821) BP: (161)/(108) 161/108 (08/08 0821) SpO2:  [100 %] 100 % (08/08 0821) Weight:  [106.1 kg] 106.1 kg (08/08 0821)   GEN: NAD EYE: Sclerae anicteric ENT: MMM CV: Non-tachycardic GI: Soft, NT/ND NEURO:  Alert & Oriented x 3  Lab Results: No results for input(s): WBC, HGB, HCT, PLT in the last 72 hours. BMET No results  for input(s): NA, K, CL, CO2, GLUCOSE, BUN, CREATININE, CALCIUM in the last 72 hours. LFT No results for input(s): PROT, ALBUMIN, AST, ALT, ALKPHOS, BILITOT, BILIDIR, IBILI in the last 72 hours. PT/INR No results for input(s): LABPROT, INR in the last 72 hours.   Impression / Plan: This is a 40 y.o.female who presents for EGD/EUS to evaluate persistent nausea/vomiting and ensure no choledocholithiasis.  The risks of an EUS including intestinal perforation, bleeding, infection, aspiration, and medication effects were discussed as was the possibility it may not give a definitive diagnosis if a biopsy is performed.  When a biopsy of the pancreas is done as part of the EUS, there is an additional risk of pancreatitis at the rate of about 1-2%.  It was explained that procedure related pancreatitis is typically mild, although it can be severe and even life threatening, which is why we do not perform random pancreatic biopsies and only biopsy a lesion/area we feel is concerning enough to warrant the risk.  The risks and benefits of endoscopic evaluation were discussed with the patient; these include but are not limited to the risk of perforation, infection, bleeding, missed lesions, lack of diagnosis, severe illness requiring hospitalization, as well as anesthesia and sedation related illnesses.  The patient is agreeable to proceed.    Angie Parish, MD [facility] Office # [phone]

## 2020-10-25 NOTE — Anesthesia Procedure Notes (Signed)
Procedure Name: MAC Date/Time: 10/25/2020 8:44 AM Performed by: Williford, Peggy D, CRNA Pre-anesthesia Checklist: Patient identified, Emergency Drugs available, Suction available and Patient being monitored Oxygen Delivery Method: Simple face mask

## 2020-10-25 NOTE — Anesthesia Postprocedure Evaluation (Signed)
Anesthesia Post Note  Patient: Angie Brown  Procedure(s) Performed: UPPER ESOPHAGEAL ENDOSCOPIC ULTRASOUND (EUS) ESOPHAGOGASTRODUODENOSCOPY (EGD) WITH PROPOFOL BIOPSY     Patient location during evaluation: PACU Anesthesia Type: MAC Level of consciousness: awake and alert Pain management: pain level controlled Vital Signs Assessment: post-procedure vital signs reviewed and stable Respiratory status: spontaneous breathing, nonlabored ventilation, respiratory function stable and patient connected to nasal cannula oxygen Cardiovascular status: stable and blood pressure returned to baseline Postop Assessment: no apparent nausea or vomiting Anesthetic complications: no   No notable events documented.  Last Vitals:  Vitals:   10/25/20 0821 10/25/20 0935  BP: (!) 161/108 109/77  Pulse: 93 85  Resp: (!) 9 20  Temp: 36.8 C 36.6 C  SpO2: 100% 100%    Last Pain:  Vitals:   10/25/20 0935  TempSrc: Axillary  PainSc:                  [name] S

## 2020-10-25 NOTE — Transfer of Care (Signed)
Immediate Anesthesia Transfer of Care Note  Patient: Angie Brown  Procedure(s) Performed: UPPER ESOPHAGEAL ENDOSCOPIC ULTRASOUND (EUS) ESOPHAGOGASTRODUODENOSCOPY (EGD) WITH PROPOFOL BIOPSY  Patient Location: PACU  Anesthesia Type:MAC  Level of Consciousness: awake, alert  and oriented  Airway & Oxygen Therapy: Patient Spontanous Breathing and Patient connected to face mask oxygen  Post-op Assessment: Report given to RN and Post -op Vital signs reviewed and stable  Post vital signs: Reviewed and stable  Last Vitals:  Vitals Value Taken Time  BP    Temp    Pulse 87 10/25/20 0936  Resp 20 10/25/20 0936  SpO2 100 % 10/25/20 0936  Vitals shown include unvalidated device data.  Last Pain:  Vitals:   10/25/20 0821  TempSrc: Oral  PainSc: 5          Complications: No notable events documented.

## 2020-10-26 ENCOUNTER — Encounter: Payer: Self-pay | Admitting: Gastroenterology

## 2020-10-26 LAB — SURGICAL PATHOLOGY

## 2020-10-27 ENCOUNTER — Encounter (HOSPITAL_COMMUNITY): Payer: Self-pay | Admitting: Gastroenterology

## 2020-10-27 ENCOUNTER — Other Ambulatory Visit: Payer: Self-pay

## 2020-10-27 DIAGNOSIS — R112 Nausea with vomiting, unspecified: Secondary | ICD-10-CM

## 2020-10-27 DIAGNOSIS — Q891 Congenital malformations of adrenal gland: Secondary | ICD-10-CM

## 2020-11-09 ENCOUNTER — Encounter: Payer: Medicaid Other | Admitting: Gastroenterology

## 2020-11-10 ENCOUNTER — Encounter: Payer: Medicaid Other | Admitting: Gastroenterology

## 2020-11-11 ENCOUNTER — Ambulatory Visit: Payer: Medicaid Other | Admitting: Allergy

## 2020-11-20 ENCOUNTER — Other Ambulatory Visit: Payer: Self-pay | Admitting: Gastroenterology

## 2020-11-24 ENCOUNTER — Other Ambulatory Visit: Payer: Self-pay

## 2020-11-24 ENCOUNTER — Ambulatory Visit
Admission: RE | Admit: 2020-11-24 | Discharge: 2020-11-24 | Disposition: A | Discharge status: Home / Self Care | Payer: Medicaid Other | Source: Ambulatory Visit | Attending: Urgent Care | Admitting: Urgent Care

## 2020-11-24 VITALS — BP 134/93 | HR 110 | Temp 98.2°F | Resp 18

## 2020-11-24 DIAGNOSIS — H65192 Other acute nonsuppurative otitis media, left ear: Secondary | ICD-10-CM

## 2020-11-24 DIAGNOSIS — H9202 Otalgia, left ear: Secondary | ICD-10-CM

## 2020-11-24 DIAGNOSIS — J3089 Other allergic rhinitis: Secondary | ICD-10-CM

## 2020-11-24 MED ORDER — AMOXICILLIN 875 MG PO TABS: 875.0000 mg | ORAL_TABLET | Freq: Two times a day (BID) | ORAL | 0 refills | Status: DC

## 2020-11-24 MED ORDER — FLUCONAZOLE 150 MG PO TABS: 150.0000 mg | ORAL_TABLET | ORAL | 0 refills | Status: DC

## 2020-11-24 NOTE — ED Triage Notes (Signed)
Pt c/o lt ear pain since Sunday with pain radiating down to lt throat.

## 2020-11-24 NOTE — ED Provider Notes (Signed)
[facility]   MRN: [id number] DOB: 1980-04-04  Subjective:   Angie Brown is a 40 y.o. female presenting for 4-day history of acute onset persistent and worsening constant left ear pain that is radiating into her throat and neck.  Had swelling of her neck near her ear as well.  Has a history of ear infections, throat infections.  Has had her tonsils removed.  Had ear tubes as a kid.  Has a history of allergic rhinitis and takes her allergy medications every day for this including Singulair.  No current facility-administered medications for this encounter.  Current Outpatient Medications:    albuterol (PROVENTIL) (2.5 MG/3ML) 0.083% nebulizer solution, Take 2.5 mg by nebulization every 6 (six) hours as needed for wheezing or shortness of breath. , Disp: , Rfl:    albuterol (VENTOLIN HFA) 108 (90 Base) MCG/ACT inhaler, Inhale 1-2 puffs into the lungs every 6 (six) hours as needed for wheezing or shortness of breath., Disp: 18 g, Rfl: 0   clonazePAM (KLONOPIN) 1 MG tablet, Take 1 mg by mouth 2 (two) times daily., Disp: , Rfl:    Dexlansoprazole (DEXILANT) 30 MG capsule, Take 1 capsule (30 mg total) by mouth daily., Disp: 30 capsule, Rfl: 3   dicyclomine (BENTYL) 10 MG capsule, Take 1 capsule (10 mg total) by mouth 4 (four) times daily -  before meals and at bedtime., Disp: 90 capsule, Rfl: 3   diphenhydrAMINE (BENADRYL) 25 mg capsule, Take 25 mg by mouth at bedtime as needed for allergies. , Disp: , Rfl:    DULoxetine (CYMBALTA) 60 MG capsule, Take 60 mg by mouth daily., Disp: , Rfl:    EPIPEN 2-PAK 0.3 MG/0.3ML SOAJ injection, Inject 0.3 mLs (0.3 mg total) into the muscle as needed (for allergic reaction). (Patient taking differently: Inject 0.3 mg into the muscle daily as needed for anaphylaxis.), Disp: 2 each, Rfl: 0   FLUoxetine (PROZAC) 10 MG capsule, Take by mouth., Disp: , Rfl:    fluticasone (FLONASE) 50 MCG/ACT nasal spray, Place 1 spray into both nostrils at bedtime.,  Disp: , Rfl:    folic acid (FOLVITE) 1 MG tablet, Take 1 mg by mouth daily., Disp: , Rfl:    hydrOXYzine (VISTARIL) 25 MG capsule, Take 25 mg by mouth 3 (three) times daily as needed for anxiety., Disp: , Rfl:    linaclotide (LINZESS) 145 MCG CAPS capsule, Take 1 capsule (145 mcg total) by mouth daily before breakfast., Disp: 30 capsule, Rfl: 2   montelukast (SINGULAIR) 10 MG tablet, Take 10 mg by mouth at bedtime., Disp: , Rfl:    promethazine (PHENERGAN) 25 MG tablet, TAKE 1 TABLET BY MOUTH EVERY 6 HOURS AS NEEDED FOR NAUSEA OR VOMITING., Disp: 30 tablet, Rfl: 0   rizatriptan (MAXALT) 10 MG tablet, Take 10 mg by mouth as needed for migraine. May repeat in 2 hours if needed, Disp: , Rfl:    rosuvastatin (CRESTOR) 20 MG tablet, Take 20 mg by mouth daily., Disp: , Rfl:    sucralfate (CARAFATE) 1 GM/10ML suspension, Take 10 mLs (1 g total) by mouth 2 (two) times daily., Disp: 420 mL, Rfl: 1   TRANSDERM-SCOP, 1.5 MG, 1 MG/3DAYS, Place 1 patch (1.5 mg total) onto the skin every 3 (three) days., Disp: 10 patch, Rfl: 12   Vitamin D, Ergocalciferol, (DRISDOL) 1.25 MG (50000 UNIT) CAPS capsule, Take 50,000 Units by mouth once a week., Disp: , Rfl:    Allergies  Allergen Reactions   Loratadine Shortness Of Breath, Swelling and  Palpitations    hypoventilation    Wasp Venom Anaphylaxis   Sumatriptan Other (See Comments)    sinus pain, sinus on fire and face hurts   Tape Other (See Comments)    unknown   Doxycycline Nausea And Vomiting   Ibuprofen Nausea And Vomiting    GI upset   Methocarbamol Rash    Past Medical History:  Diagnosis Date   Allergy    Wasps   Asthma    Bronchitis    Depression    Dysrhythmia    PVCs- stress related   Family history of adverse reaction to anesthesia    Hypoglycemia    Hypothyroidism    PONV (postoperative nausea and vomiting)    pt prefers scop patch   Renal disorder    2008     Past Surgical History:  Procedure Laterality Date   ABDOMINAL  HYSTERECTOMY     BACK SURGERY  07/2009   spinal fusion L 5-S1   BIOPSY  12/07/2018   Procedure: BIOPSY;  Surgeon: Lemar Lofty., MD;  Location: [facility];  Service: Gastroenterology;;   BIOPSY  10/25/2020   Procedure: BIOPSY;  Surgeon: Lemar Lofty., MD;  Location: [facility];  Service: Gastroenterology;;   BLADDER SUSPENSION N/A 03/11/2014   Procedure: TRANSVAGINAL TAPE (TVT) PROCEDURE;  Surgeon: Lavina Hamman, MD;  Location: [facility];  Service: Gynecology;  Laterality: N/A;   CHOLECYSTECTOMY     ESOPHAGOGASTRODUODENOSCOPY (EGD) WITH PROPOFOL N/A 12/07/2018   Procedure: ESOPHAGOGASTRODUODENOSCOPY (EGD) WITH PROPOFOL;  Surgeon: Meridee Score Netty Starring., MD;  Location: [facility];  Service: Gastroenterology;  Laterality: N/A;   ESOPHAGOGASTRODUODENOSCOPY (EGD) WITH PROPOFOL N/A 10/25/2020   Procedure: ESOPHAGOGASTRODUODENOSCOPY (EGD) WITH PROPOFOL;  Surgeon: Meridee Score Netty Starring., MD;  Location: [facility];  Service: Gastroenterology;  Laterality: N/A;   TONSILLECTOMY AND ADENOIDECTOMY     UPPER ESOPHAGEAL ENDOSCOPIC ULTRASOUND (EUS) N/A 10/25/2020   Procedure: UPPER ESOPHAGEAL ENDOSCOPIC ULTRASOUND (EUS);  Surgeon: Lemar Lofty., MD;  Location: [facility];  Service: Gastroenterology;  Laterality: N/A;    Family History  Problem Relation Age of Onset   Liver disease Mother        Fatty liver, NASH   GI Bleed Mother    Hypothyroidism Mother    Hypothyroidism Sister    Liver disease Sister        fatty liver   Liver cancer Maternal Grandfather    Liver disease Maternal Grandfather        fatty liver   Hypothyroidism Sister    Liver disease Sister        fatty liver   Thyroid cancer Sister    Colon cancer Neg Hx    Esophageal cancer Neg Hx    Rectal cancer Neg Hx    Inflammatory bowel disease Neg Hx    Pancreatic cancer Neg Hx    Stomach cancer Neg Hx     Social History   Tobacco Use   Smoking status: Former    Packs/day: 0.50    Types:  Cigarettes   Smokeless tobacco: Never  Vaping Use   Vaping Use: Some days   Substances: Nicotine, Flavoring  Substance Use Topics   Alcohol use: Not Currently   Drug use: No    ROS   Objective:   Vitals: BP (!) 134/93 (BP Location: Left Arm)   Pulse (!) 110   Temp 98.2 F (36.8 C) (Oral)   Resp 18   SpO2 95%   Physical Exam Constitutional:      General: She  is not in acute distress.    Appearance: She is well-developed. She is obese. She is not ill-appearing, toxic-appearing or diaphoretic.  HENT:     Head: Normocephalic and atraumatic.     Right Ear: Tympanic membrane, ear canal and external ear normal. No drainage or tenderness. No middle ear effusion. There is no impacted cerumen. Tympanic membrane is not erythematous.     Left Ear: Ear canal normal. No drainage or tenderness.  No middle ear effusion. There is no impacted cerumen. No mastoid tenderness. Tympanic membrane is scarred, erythematous and bulging. Tympanic membrane is not perforated or retracted.     Nose: No congestion or rhinorrhea.     Mouth/Throat:     Mouth: Mucous membranes are moist. No oral lesions.     Pharynx: No pharyngeal swelling, oropharyngeal exudate, posterior oropharyngeal erythema or uvula swelling.     Tonsils: No tonsillar exudate or tonsillar abscesses.  Eyes:     General: No scleral icterus.       Right eye: No discharge.        Left eye: No discharge.     Extraocular Movements: Extraocular movements intact.     Right eye: Normal extraocular motion.     Left eye: Normal extraocular motion.     Conjunctiva/sclera: Conjunctivae normal.     Pupils: Pupils are equal, round, and reactive to light.  Cardiovascular:     Rate and Rhythm: Normal rate.  Pulmonary:     Effort: Pulmonary effort is normal.  Musculoskeletal:     Cervical back: Normal range of motion and neck supple.  Lymphadenopathy:     Cervical: No cervical adenopathy.  Skin:    General: Skin is warm and dry.   Neurological:     General: No focal deficit present.     Mental Status: She is alert and oriented to person, place, and time.  Psychiatric:        Mood and Affect: Mood normal.        Behavior: Behavior normal.        Thought Content: Thought content normal.        Judgment: Judgment normal.    Assessment and Plan :   PDMP not reviewed this encounter.  1. Other non-recurrent acute nonsuppurative otitis media of left ear   2. Left ear pain   3. Allergic rhinitis due to other allergic trigger, unspecified seasonality     Start amoxicillin to cover for otitis media. Use supportive care otherwise.  Maintain regular medications for allergic rhinitis.  Counseled patient on potential for adverse effects with medications prescribed/recommended today, ER and return-to-clinic precautions discussed, patient verbalized understanding.    Wallis Bamberg, PA-C 11/24/20 1200

## 2020-11-30 ENCOUNTER — Telehealth: Payer: Self-pay | Admitting: Gastroenterology

## 2020-11-30 NOTE — Telephone Encounter (Signed)
I have spoken with [name] and she has been advised that MRI and GES are required.  She will call the pt to set up.

## 2020-11-30 NOTE — Telephone Encounter (Signed)
Inbound call from [name] at [facility]. States patient is scheduled for Gastric emptying 9/15. She see she have for abd with and without contrast that is not yet scheduled. Wants a call back to discuss if this needs to be scheduled as well?  Best contact [phone] ask for [name].

## 2020-12-02 ENCOUNTER — Encounter (HOSPITAL_COMMUNITY)
Admission: RE | Admit: 2020-12-02 | Discharge: 2020-12-02 | Disposition: A | Discharge status: Home / Self Care | Payer: Medicaid Other | Source: Ambulatory Visit | Attending: Gastroenterology | Admitting: Gastroenterology

## 2020-12-02 ENCOUNTER — Other Ambulatory Visit: Payer: Self-pay

## 2020-12-02 DIAGNOSIS — Q891 Congenital malformations of adrenal gland: Secondary | ICD-10-CM | POA: Insufficient documentation

## 2020-12-02 DIAGNOSIS — R112 Nausea with vomiting, unspecified: Secondary | ICD-10-CM | POA: Diagnosis present

## 2020-12-02 MED ORDER — TECHNETIUM TC 99M SULFUR COLLOID
2.2000 | Freq: Once | INTRAVENOUS | Status: AC | PRN
  Administered 2020-12-02: 2.2 via ORAL

## 2020-12-13 ENCOUNTER — Telehealth: Payer: Self-pay | Admitting: Gastroenterology

## 2020-12-13 NOTE — Telephone Encounter (Signed)
Inbound call from pt requesting a call back stating she is having some rectal bleeding. Please advise. Thank you.

## 2020-12-13 NOTE — Telephone Encounter (Signed)
Left message on machine to call back  

## 2020-12-13 NOTE — Telephone Encounter (Signed)
Pt returned call and was transferred to nurse extension.  

## 2020-12-14 ENCOUNTER — Other Ambulatory Visit (HOSPITAL_COMMUNITY): Payer: Medicaid Other

## 2020-12-14 NOTE — Telephone Encounter (Signed)
Left message on machine to call back  

## 2020-12-15 ENCOUNTER — Encounter: Payer: Medicaid Other | Admitting: Gastroenterology

## 2020-12-15 NOTE — Telephone Encounter (Signed)
Pt returned call and was transferred to nurse vm. 

## 2020-12-15 NOTE — Telephone Encounter (Signed)
Left message on machine to call back   I have been unable to reach pt by phone will await further communication from the pt.

## 2020-12-16 ENCOUNTER — Ambulatory Visit (INDEPENDENT_AMBULATORY_CARE_PROVIDER_SITE_OTHER): Payer: Medicaid Other | Admitting: Allergy

## 2020-12-16 ENCOUNTER — Other Ambulatory Visit: Payer: Self-pay

## 2020-12-16 ENCOUNTER — Encounter: Payer: Self-pay | Admitting: Allergy

## 2020-12-16 VITALS — BP 132/76 | HR 92 | Temp 97.2°F | Resp 20 | Ht 67.0 in | Wt 236.4 lb

## 2020-12-16 DIAGNOSIS — Z91038 Other insect allergy status: Secondary | ICD-10-CM | POA: Diagnosis not present

## 2020-12-16 DIAGNOSIS — J310 Chronic rhinitis: Secondary | ICD-10-CM

## 2020-12-16 DIAGNOSIS — T781XXA Other adverse food reactions, not elsewhere classified, initial encounter: Secondary | ICD-10-CM | POA: Insufficient documentation

## 2020-12-16 DIAGNOSIS — T7819XD Other adverse food reactions, not elsewhere classified, subsequent encounter: Secondary | ICD-10-CM

## 2020-12-16 DIAGNOSIS — J45909 Unspecified asthma, uncomplicated: Secondary | ICD-10-CM

## 2020-12-16 DIAGNOSIS — T781XXD Other adverse food reactions, not elsewhere classified, subsequent encounter: Secondary | ICD-10-CM

## 2020-12-16 MED ORDER — EPINEPHRINE 0.3 MG/0.3ML IJ SOAJ: 0.3000 mg | INTRAMUSCULAR | 2 refills | Status: AC | PRN

## 2020-12-16 MED ORDER — MONTELUKAST SODIUM 10 MG PO TABS: 10.0000 mg | ORAL_TABLET | Freq: Every day | ORAL | 5 refills | Status: AC

## 2020-12-16 MED ORDER — FLUTICASONE PROPIONATE 50 MCG/ACT NA SUSP: 1.0000 | Freq: Two times a day (BID) | NASAL | 5 refills | Status: AC | PRN

## 2020-12-16 NOTE — Assessment & Plan Note (Signed)
Patient follows with GI and concerned whether she has alpha gal allergy. Does not recall being bitten by ticks recently. No immediate symptoms after eating red meat. Eggs cause indigestion.  Check alpha-gal panel for alpha-gal allergy. Given clinical history low suspicion of alpha-gal allergy.  Today's skin testing was negative to common foods but positive control was only borderline reactive.  . Continue to avoid red meat until bloodwork results are back.  . Continue diet recommended by GI.

## 2020-12-16 NOTE — Progress Notes (Signed)
New Patient Note  RE: [name] MRN: [id number] DOB: Feb 11, 1981 Date of Office Visit: 12/16/2020  Consult requested by: Mansouraty, Netty Starring.* Primary care provider: Laqueta Due., MD  Chief Complaint: Allergy Testing and Allergic Reaction (Wasp venom, alpha gal )  History of Present Illness: I had the pleasure of seeing Angie Brown for initial evaluation at the [facility] on 12/16/2020. She is a 40 y.o. female, who is referred here by Dr. Meridee Score (GI) for the evaluation of alpha gal allergy and hymenoptera allergy. She is accompanied today by her daughter who provided/contributed to the history.   Food: Patient follows with GI for her multiple GI issues.  She states that she had issues with an "enlarged liver" after eating red meat consistently. No recent tick bites. Denies any immediate symptoms after eating red meat and she didn't realize that pork was red meat and has been eating it without any issues.   She has nausea all the time which is at baseline.   Patient currently follows the low fodmap diet - mainly eating chicken, [place] and salad with some benefit. Eggs cause indigestion  Dietary History: patient has been eating other foods including lactaid milk, peanut, treenuts, sesame, shellfish, fish, limited soy, limited wheat, meats, fruits and vegetables.  Component     Latest Ref Rng & Units 12/19/2019  Alpha-Galactosidase activity     >35.5 nmol/hr/mg prt 21.5 (L)    Assessment and Plan: [name] is a 40 y.o. female with: Adverse food reaction Patient follows with GI and concerned whether she has alpha gal allergy. Does not recall being bitten by ticks recently. No immediate symptoms after eating red meat. Eggs cause indigestion. Check alpha-gal panel for alpha-gal allergy. Given clinical history low suspicion of alpha-gal allergy. Today's skin testing was negative to common foods but positive control was only borderline reactive.  Continue to  avoid red meat until bloodwork results are back.  Continue diet recommended by GI.  Reactive airway disease without complication Uses albuterol in the spring and fall due to chest tightness and coughing. Today's spirometry was normal. May use albuterol rescue inhaler 2 puffs every 4 to 6 hours as needed for shortness of breath, chest tightness, coughing, and wheezing. Monitor frequency of use.   Chronic rhinitis Symptoms in the spring and fall. Takes hydroxyzine, Singulair and Flonase prn with good benefit. Intolerant of Claritin. Today's skin testing showed: Negative to indoor/outdoor allergens but positive control was not very reactive. Get bloodwork for environmental allergy panel.  Use over the counter antihistamines such as Zyrtec (cetirizine), Allegra (fexofenadine), or Xyzal (levocetirizine) daily as needed.  May switch antihistamines every few months. Continue Singulair (montelukast) 10mg  daily at night. Use Flonase (fluticasone) nasal spray 1 spray per nostril twice a day as needed for nasal congestion.  Nasal saline spray (i.e., Simply Saline) or nasal saline lavage (i.e., NeilMed) is recommended as needed and prior to medicated nasal sprays.  Hymenoptera allergy Patient hospitalized in 2020 after stung by a wasp leading to pulselessness and needing multiple rounds of epi with epi drip. She started venom immunotherapy but was stopped due to facial swelling on the lowest dilution.  Continue to avoid. Get bloodwork for hymenoptera panel.  If positive, will discuss starting venom immunotherapy. If negative, recommend undergoing skin testing in our [facility] office next due to strong clinical history.  I have prescribed epinephrine injectable device and demonstrated proper use. For mild symptoms you can take over the counter antihistamines such  as Benadryl and monitor symptoms closely. If symptoms worsen or if you have severe symptoms including breathing issues, throat closure,  significant swelling, whole body hives, severe diarrhea and vomiting, lightheadedness then inject epinephrine and seek immediate medical care afterwards. Emergency action plan given.  Return in about 6 months (around 06/15/2021).  Meds ordered this encounter  Medications   montelukast (SINGULAIR) 10 MG tablet    Sig: Take 1 tablet (10 mg total) by mouth at bedtime.    Dispense:  30 tablet    Refill:  5   fluticasone (FLONASE) 50 MCG/ACT nasal spray    Sig: Place 1 spray into both nostrils 2 (two) times daily as needed (nasal congestion).    Dispense:  16 g    Refill:  5   EPINEPHrine 0.3 mg/0.3 mL IJ SOAJ injection    Sig: Inject 0.3 mg into the muscle as needed for anaphylaxis.    Dispense:  1 each    Refill:  2    May dispense generic/Mylan/Teva brand.    Lab Orders         Alpha-Gal Panel         Tryptase         Allergen Hymenoptera Panel         Allergens w/Total IgE Area 2      Other allergy screening: Asthma: yes Patient has seasonal asthma with chest tightness, coughing.  Usually has to use albuterol in the spring and fall. Sometimes has to use daily during flares for a few days.  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 Takes hydroxyzine and Singulair with good benefit.  Uses Flonase prn with good benefit. Skin testing in 2019 was positive to hay per patient report. No prior AIT.  Medication allergy: yes Claritin - cause hyperventilation. No issues with hydroxyzine.  Hymenoptera allergy: yes In 2020 patient got stung by a wasp and her heart stopped - requiring defibrillation and hospitalization. Patient apparently had epi x 6.  Patient was on venom immunotherapy but stopped due to reactions due to facial swelling at the lowest dilution.   2020 hospitalization: "History of present illness: As per the H and P dictated on admission, "40 year old female past medical history significant for severe allergic reaction to wasp,  arrhythmia, asthma, hypoglycemia and postoperative nausea vomiting.  According to the patient, she experience a reaction to wasp for the first time about a year ago.  Patient was referred to an allergist, but could not tolerate [place] desensitization.  Whilst removing logs of wood from the deck earlier today, the patient got stung by a wasp on the right thigh and developed severe anaphylactic reaction with reported pulselessness for about 6 minutes.  EpiPen was administered by patient's number after patient was stung by wasp, and EMS continued epi drip on arrival.  Pulselessness has resolved.  Patient is back to her baseline.  Hospitalist team has been asked to observe patient overnight.  No headache, no neck pain, no fever or chills, no shortness of breath, no chest tightness, no GI symptoms and no urinary symptoms."   Hospital Course:  Summary of his active problems in the hospital is as following. anaphylactic shock and cardiovascular collapse Severe anaphylactic reaction to wasp sting with pulselessness: Her Epipen was expired.  CPR for several minutes Observed patient overnight.  no issues  Epinephrine drip has been discontinued. Continue oral steroids, Pepcid and Benadryl. PCP to consider referral to allergist on discharge.   Patient was ambulatory without any  assistance. On the day of the discharge the patient's vitals were stable, and no other acute medical condition were reported by patient. the patient was felt safe to be discharge at Home with no therapy needed on discharge."  History of recurrent infections suggestive of immunodeficency: no  Diagnostics: Spirometry:  Tracings reviewed. Her effort: Good reproducible efforts. FVC: 3.24L FEV1: 2.54L, 77% predicted FEV1/FVC ratio: 78% Interpretation: Spirometry consistent with normal pattern.  Please see scanned spirometry results for details.  Skin Testing: Environmental allergy panel. Negative to indoor/outdoor allergens and  common foods however positive control was borderline reactive. Results discussed with patient/family.  Airborne Adult Perc - 12/16/20 1445     Time Antigen Placed 1445    Allergen Manufacturer Waynette Buttery    Location Back    Number of Test 59    Panel 1 Select    1. Control-Buffer 50% Glycerol Negative    2. Control-Histamine 1 mg/ml --   +/-   3. Albumin saline Negative    4. Bahia Negative    5. French Southern Territories Negative    6. Johnson Negative    7. Kentucky Blue Negative    8. Meadow Fescue Negative    9. Perennial Rye Negative    10. Sweet Vernal Negative    11. Timothy Negative    12. Cocklebur Negative    13. Burweed Marshelder Negative    14. Ragweed, short Negative    15. Ragweed, Giant Negative    16. Plantain,  English Negative    17. Lamb's Quarters Negative    18. Sheep Sorrell Negative    19. Rough Pigweed Negative    20. Marsh Elder, Rough Negative    21. Mugwort, Common Negative    22. Ash mix Negative    23. Birch mix Negative    24. Beech American Negative    25. Box, Elder Negative    26. Cedar, red Negative    27. Cottonwood, Guinea-Bissau Negative    28. Elm mix Negative    29. Hickory Negative    30. Maple mix Negative    31. Oak, [place] mix Negative    32. Pecan Pollen Negative    33. Pine mix Negative    34. Sycamore Eastern Negative    35. Walnut, Black Pollen Negative    36. Alternaria alternata Negative    37. Cladosporium Herbarum Negative    38. Aspergillus mix Negative    39. Penicillium mix Negative    40. Bipolaris sorokiniana (Helminthosporium) Negative    41. Drechslera spicifera (Curvularia) Negative    42. Mucor plumbeus Negative    43. Fusarium moniliforme Negative    44. Aureobasidium pullulans (pullulara) Negative    45. Rhizopus oryzae Negative    46. Botrytis cinera Negative    47. Epicoccum nigrum Negative    48. Phoma betae Negative    49. Candida Albicans Negative    50. Trichophyton mentagrophytes Negative    51. Mite, D Farinae   5,000 AU/ml Negative    52. Mite, D Pteronyssinus  5,000 AU/ml Negative    53. Cat Hair 10,000 BAU/ml Negative    54.  Dog Epithelia Negative    55. Mixed Feathers Negative    56. Horse Epithelia Negative    57. Cockroach, German Negative    58. Mouse Negative    59. Tobacco Leaf Negative             Food Perc - 12/16/20 1446       Test Information  Time Antigen Placed 1446    Allergen Manufacturer Waynette Buttery    Location Back    Number of allergen test 10    Food Select      Food   1. Peanut Negative    2. Soybean food Negative    3. Wheat, whole Negative    4. Sesame Negative    5. Milk, cow Negative    6. Egg White, chicken Negative    7. Casein Negative    8. Shellfish mix Negative    9. Fish mix Negative    10. 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   Constipation 08/20/2020   Unintentional weight loss 08/20/2020   RUQ pain 08/20/2020   Bright red blood per rectum 08/20/2020   External hemorrhoids 08/20/2020   BRBPR (bright red blood per rectum) 12/22/2019   Nausea without vomiting 12/22/2019   Hemorrhoids 12/22/2019   Change in bowel function 01/12/2019   Fatty liver 01/12/2019   Gastritis and gastroduodenitis 01/12/2019   Abnormal LFTs 01/12/2019   Abdominal pain, right upper quadrant 01/12/2019   Non-intractable vomiting    NASH (nonalcoholic steatohepatitis)    Hepatitis 12/03/2018   Transaminitis 12/03/2018   Anaphylactic reaction to wasp sting 10/12/2018   Abdominal pain 06/08/2017   Chronic constipation 02/14/2017   Asthma 01/26/2017   Anaphylaxis 09/29/2016   Elevated glucose 09/29/2016   Chronic pain 06/29/2016   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   Voiding  dysfunction 03/08/2016   Other insomnia 03/01/2016   Elevated blood pressure reading 01/05/2016   GAD (generalized anxiety disorder) 01/05/2016   Gastroesophageal reflux disease without esophagitis 01/05/2016   Recurrent major depressive disorder (HCC) 01/05/2016   Stress incontinence in female 03/11/2014   HYPOTENSION 03/18/2010   ARM PAIN 04/28/2009   ABNORMAL INVOLUNTARY MOVEMENTS 04/28/2009   FACIAL PARESTHESIA 04/28/2009   HEADACHE 04/26/2009   WHEEZING 03/23/2009   DIARRHEA, ACUTE, CHRONIC 02/19/2009   HYDRONEPHROSIS, RIGHT 02/18/2009   PYELONEPHRITIS, HX OF 02/18/2009   MIGRAINES, HX OF 02/18/2009   LUMBAR RADICULOPATHY, RIGHT 12/23/2008   ASTHMATIC BRONCHITIS, ACUTE 01/22/2008   BACK PAIN 01/22/2008   ELEVATED BLOOD PRESSURE WITHOUT DIAGNOSIS OF HYPERTENSION 01/22/2008   OBESITY, MORBID 05/29/2007   INSOMNIA-SLEEP DISORDER-UNSPEC 05/29/2007   HYPERSOMNIA 05/29/2007   ANEMIA-NOS 01/01/2007   ANXIETY 01/01/2007   DEPRESSION 01/01/2007   COMMON MIGRAINE 01/01/2007   ASTHMA 01/01/2007   SYNCOPE 01/01/2007   Past Medical History:  Diagnosis Date   Allergy    Wasps   Asthma    Bronchitis    Depression    Dysrhythmia    PVCs- stress related   Eczema    Family history of adverse reaction to anesthesia    Hypoglycemia    Hypothyroidism    PONV (postoperative nausea and vomiting)    pt prefers scop patch   Renal disorder    2008   Past Surgical History: Past Surgical History:  Procedure Laterality Date   ABDOMINAL HYSTERECTOMY     ADENOIDECTOMY     BACK SURGERY  07/2009   spinal fusion L 5-S1   BIOPSY  12/07/2018   Procedure: BIOPSY;  Surgeon: Lemar Lofty., MD;  Location: [facility];  Service: Gastroenterology;;   BIOPSY  10/25/2020   Procedure: BIOPSY;  Surgeon: Lemar Lofty., MD;  Location: [facility];  Service: Gastroenterology;;   BLADDER SUSPENSION N/A 03/11/2014   Procedure: TRANSVAGINAL TAPE (TVT) PROCEDURE;  Surgeon: Lavina Hamman, MD;  Location: [facility];  Service: Gynecology;  Laterality: N/A;   CHOLECYSTECTOMY     ESOPHAGOGASTRODUODENOSCOPY (EGD) WITH PROPOFOL N/A 12/07/2018   Procedure: ESOPHAGOGASTRODUODENOSCOPY (EGD) WITH PROPOFOL;  Surgeon: Meridee Score Netty Starring., MD;  Location: [facility];  Service: Gastroenterology;  Laterality: N/A;   ESOPHAGOGASTRODUODENOSCOPY (EGD) WITH PROPOFOL N/A 10/25/2020   Procedure: ESOPHAGOGASTRODUODENOSCOPY (EGD) WITH PROPOFOL;  Surgeon: Meridee Score Netty Starring., MD;  Location: [facility];  Service: Gastroenterology;  Laterality: N/A;   TONSILLECTOMY AND ADENOIDECTOMY     TYMPANOSTOMY TUBE PLACEMENT     UPPER ESOPHAGEAL ENDOSCOPIC ULTRASOUND (EUS) N/A 10/25/2020   Procedure: UPPER ESOPHAGEAL ENDOSCOPIC ULTRASOUND (EUS);  Surgeon: Lemar Lofty., MD;  Location: [facility];  Service: Gastroenterology;  Laterality: N/A;   Medication List:  Current Outpatient Medications  Medication Sig Dispense Refill   albuterol (PROVENTIL) (2.5 MG/3ML) 0.083% nebulizer solution Take 2.5 mg by nebulization every 6 (six) hours as needed for wheezing or shortness of breath.      albuterol (VENTOLIN HFA) 108 (90 Base) MCG/ACT inhaler Inhale 1-2 puffs into the lungs every 6 (six) hours as needed for wheezing or shortness of breath. 18 g 0   amoxicillin (AMOXIL) 875 MG tablet Take 1 tablet (875 mg total) by mouth 2 (two) times daily. 20 tablet 0   clonazePAM (KLONOPIN) 1 MG tablet Take 1 mg by mouth 2 (two) times daily.     Dexlansoprazole (DEXILANT) 30 MG capsule Take 1 capsule (30 mg total) by mouth daily. 30 capsule 3   dicyclomine (BENTYL) 10 MG capsule Take 1 capsule (10 mg total) by mouth 4 (four) times daily -  before meals and at bedtime. 90 capsule 3   diphenhydrAMINE (BENADRYL) 25 mg capsule Take 25 mg by mouth at bedtime as needed for allergies.      DULoxetine (CYMBALTA) 60 MG capsule Take 60 mg by mouth daily.     EPINEPHrine 0.3 mg/0.3 mL IJ SOAJ injection Inject 0.3 mg  into the muscle as needed for anaphylaxis. 1 each 2   fluconazole (DIFLUCAN) 150 MG tablet Take 1 tablet (150 mg total) by mouth once a week. 2 tablet 0   fluticasone (FLONASE) 50 MCG/ACT nasal spray Place 1 spray into both nostrils 2 (two) times daily as needed (nasal congestion). 16 g 5   folic acid (FOLVITE) 1 MG tablet Take 1 mg by mouth daily.     hydrOXYzine (VISTARIL) 25 MG capsule Take 25 mg by mouth 3 (three) times daily as needed for anxiety.     linaclotide (LINZESS) 145 MCG CAPS capsule Take 1 capsule (145 mcg total) by mouth daily before breakfast. 30 capsule 2   montelukast (SINGULAIR) 10 MG tablet Take 1 tablet (10 mg total) by mouth at bedtime. 30 tablet 5   promethazine (PHENERGAN) 25 MG tablet TAKE 1 TABLET BY MOUTH EVERY 6 HOURS AS NEEDED FOR NAUSEA OR VOMITING. 30 tablet 0   sucralfate (CARAFATE) 1 GM/10ML suspension Take 10 mLs (1 g total) by mouth 2 (two) times daily. 420 mL 1   TRANSDERM-SCOP, 1.5 MG, 1 MG/3DAYS Place 1 patch (1.5 mg total) onto the skin every 3 (three) days. 10 patch 12   Vitamin D, Ergocalciferol, (DRISDOL) 1.25 MG (50000 UNIT) CAPS capsule Take 50,000 Units by mouth once a week.  No current facility-administered medications for this visit.   Allergies: Allergies  Allergen Reactions   Loratadine Shortness Of Breath, Swelling and Palpitations    hypoventilation    Wasp Venom Anaphylaxis   Sumatriptan Other (See Comments)    sinus pain, sinus on fire and face hurts   Tape Other (See Comments)    unknown   Doxycycline Nausea And Vomiting   Ibuprofen Nausea And Vomiting    GI upset   Methocarbamol Rash   Social History: Social History   Socioeconomic History   Marital status: Divorced    Spouse name: Not on file   Number of children: 3   Years of education: Not on file   Highest education level: Not on file  Occupational History   Occupation: housewife  Tobacco Use   Smoking status: Former    Packs/day: 0.50    Types: Cigarettes    Smokeless tobacco: Never  Vaping Use   Vaping Use: Some days   Substances: Nicotine, Flavoring  Substance and Sexual Activity   Alcohol use: Not Currently   Drug use: No   Sexual activity: Yes    Partners: Male    Birth control/protection: Surgical  Other Topics Concern   Not on file  Social History Narrative   Not on file   Social Determinants of Health   Financial Resource Strain: Not on file  Food Insecurity: Not on file  Transportation Needs: Not on file  Physical Activity: Not on file  Stress: Not on file  Social Connections: Not on file   Lives in a 41 year old house. Smoking: < 5 cigs per day since 1994 Occupation: disabled  Environmental History: Water Damage/mildew in the house: no Carpet in the family room: no Carpet in the bedroom: no Heating:  gas and electric Cooling: central Pet: yes 2 dogs - 1 indoors and 1 outdoors.   Family History: Family History  Problem Relation Age of Onset   Asthma Mother    Allergic rhinitis Mother    Liver disease Mother        Fatty liver, NASH   GI Bleed Mother    Hypothyroidism Mother    Allergic rhinitis Father    Hypothyroidism Sister    Liver disease Sister        fatty liver   Asthma Sister    Allergic rhinitis Sister    Hypothyroidism Sister    Liver disease Sister        fatty liver   Thyroid cancer Sister    Liver cancer Maternal Grandfather    Liver disease Maternal Grandfather        fatty liver   Colon cancer Neg Hx    Esophageal cancer Neg Hx    Rectal cancer Neg Hx    Inflammatory bowel disease Neg Hx    Pancreatic cancer Neg Hx    Stomach cancer Neg Hx    Review of Systems  Constitutional:  Negative for appetite change, chills, fever and unexpected weight change.  HENT:  Positive for congestion, postnasal drip and sinus pressure. Negative for rhinorrhea.   Eyes:  Positive for itching.  Respiratory:  Negative for cough, chest tightness, shortness of breath and wheezing.   Cardiovascular:   Negative for chest pain.  Gastrointestinal:  Positive for abdominal pain, constipation and nausea.  Genitourinary:  Negative for difficulty urinating.  Skin:  Negative for rash.  Neurological:  Negative for headaches.   Objective: BP 132/76 (BP Location: Left Arm, Patient Position: Sitting, Cuff Size: Large)  Pulse 92   Temp (!) 97.2 F (36.2 C) (Temporal)   Resp 20   Ht  (1.702 m)   Wt 236 lb 6.4 oz (107.2 kg)   SpO2 97%   BMI 37.03 kg/m  Body mass index is 37.03 kg/m. Physical Exam Vitals and nursing note reviewed.  Constitutional:      Appearance: Normal appearance. She is well-developed. She is obese.  HENT:     Head: Normocephalic and atraumatic.     Right Ear: Tympanic membrane and external ear normal.     Left Ear: Tympanic membrane and external ear normal.     Nose: Nose normal.     Mouth/Throat:     Mouth: Mucous membranes are moist.     Pharynx: Oropharynx is clear.  Eyes:     Conjunctiva/sclera: Conjunctivae normal.  Cardiovascular:     Rate and Rhythm: Normal rate and regular rhythm.     Heart sounds: Normal heart sounds. No murmur heard.   No friction rub. No gallop.  Pulmonary:     Effort: Pulmonary effort is normal.     Breath sounds: Normal breath sounds. No wheezing, rhonchi or rales.  Musculoskeletal:     Cervical back: Neck supple.  Skin:    General: Skin is warm.     Findings: No rash.  Neurological:     Mental Status: She is alert and oriented to person, place, and time.  Psychiatric:        Behavior: Behavior normal.  The plan was reviewed with the patient/family, and all questions/concerned were addressed.  It was my pleasure to see Saudia today and participate in her care. Please feel free to contact me with any questions or concerns.  Sincerely,  Wyline Mood, DO Allergy & Immunology  [facility] office: [phone] [facility] office: [phone]

## 2020-12-16 NOTE — Assessment & Plan Note (Signed)
Symptoms in the spring and fall. Takes hydroxyzine, Singulair and Flonase prn with good benefit. Intolerant of Claritin.  Today's skin testing showed: Negative to indoor/outdoor allergens but positive control was not very reactive.  Get bloodwork for environmental allergy panel.   Use over the counter antihistamines such as Zyrtec (cetirizine), Allegra (fexofenadine), or Xyzal (levocetirizine) daily as needed.  May switch antihistamines every few months.  Continue Singulair (montelukast) 10mg  daily at night. . Use Flonase (fluticasone) nasal spray 1 spray per nostril twice a day as needed for nasal congestion.   Nasal saline spray (i.e., Simply Saline) or nasal saline lavage (i.e., NeilMed) is recommended as needed and prior to medicated nasal sprays.

## 2020-12-16 NOTE — Assessment & Plan Note (Signed)
Uses albuterol in the spring and fall due to chest tightness and coughing.  Today's spirometry was normal. . May use albuterol rescue inhaler 2 puffs every 4 to 6 hours as needed for shortness of breath, chest tightness, coughing, and wheezing. Monitor frequency of use.

## 2020-12-16 NOTE — Patient Instructions (Addendum)
Today's skin testing showed: Negative to indoor/outdoor allergens.  Food: Continue to avoid red meat. Check bloodwork for alpha-gal IgE which is different than what was drawn.  Continue diet recommended by your GI doctor.   Rhinitis  Will double check the skin testing with bloodwork.  Use over the counter antihistamines such as Zyrtec (cetirizine), Allegra (fexofenadine), or Xyzal (levocetirizine) daily as needed.  May switch antihistamines every few months. Continue Singulair (montelukast) 10mg  daily at night. Use Flonase (fluticasone) nasal spray 1 spray per nostril twice a day as needed for nasal congestion.  Nasal saline spray (i.e., Simply Saline) or nasal saline lavage (i.e., NeilMed) is recommended as needed and prior to medicated nasal sprays.  Breathing: Normal breathing test today. May use albuterol rescue inhaler 2 puffs every 4 to 6 hours as needed for shortness of breath, chest tightness, coughing, and wheezing. Monitor frequency of use.   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 Get bloodwork We are ordering labs, so please allow 1-2 weeks for the results to come back. With the newly implemented Cures Act, the labs might be visible to you at the same time that they become visible to me. However, I will not address the results until all of the results are back, so please be patient.  In the meantime, continue recommendations in your patient instructions, including avoidance measures (if applicable), until you hear from me. I have prescribed epinephrine injectable device and demonstrated proper use. For mild symptoms you can take over the counter antihistamines such as Benadryl and monitor symptoms closely. If symptoms worsen or if you have severe symptoms including breathing issues, throat closure, significant swelling, whole body hives, severe diarrhea and vomiting, lightheadedness then inject epinephrine and seek immediate medical care afterwards. Emergency action plan  given.  Follow up in 6 months or sooner if needed.

## 2020-12-16 NOTE — Assessment & Plan Note (Signed)
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 She started venom immunotherapy but was stopped due to facial swelling on the lowest dilution.  . Continue to avoid. . Get bloodwork for hymenoptera panel.  o If positive, will discuss starting venom immunotherapy. o If negative, recommend undergoing skin testing in our [facility] office next due to strong clinical history.  . I have prescribed epinephrine injectable device and demonstrated proper use. For mild symptoms you can take over the counter antihistamines such as Benadryl and monitor symptoms closely. If symptoms worsen or if you have severe symptoms including breathing issues, throat closure, significant swelling, whole body hives, severe diarrhea and vomiting, lightheadedness then inject epinephrine and seek immediate medical care afterwards. . Emergency action plan given.

## 2020-12-18 ENCOUNTER — Other Ambulatory Visit: Payer: Self-pay | Admitting: Gastroenterology

## 2020-12-23 ENCOUNTER — Other Ambulatory Visit: Payer: Self-pay

## 2020-12-23 ENCOUNTER — Telehealth: Payer: Self-pay | Admitting: Allergy

## 2020-12-23 ENCOUNTER — Ambulatory Visit (HOSPITAL_COMMUNITY)
Admission: RE | Admit: 2020-12-23 | Discharge: 2020-12-23 | Disposition: A | Discharge status: Home / Self Care | Payer: Medicaid Other | Source: Ambulatory Visit | Attending: Gastroenterology | Admitting: Gastroenterology

## 2020-12-23 DIAGNOSIS — R112 Nausea with vomiting, unspecified: Secondary | ICD-10-CM | POA: Insufficient documentation

## 2020-12-23 DIAGNOSIS — Q891 Congenital malformations of adrenal gland: Secondary | ICD-10-CM | POA: Insufficient documentation

## 2020-12-23 MED ORDER — GADOBUTROL 1 MMOL/ML IV SOLN
10.0000 mL | Freq: Once | INTRAVENOUS | Status: AC | PRN
  Administered 2020-12-23: 10 mL via INTRAVENOUS

## 2020-12-23 NOTE — Telephone Encounter (Signed)
Is it ok to send in a prescription for Xyzal?

## 2020-12-23 NOTE — Telephone Encounter (Signed)
Patient would like a prescription sent in for Xyzal she said it really works. She said her insurance will pay for it if it is a prescription. [facility] on [address]. Last seen 12/13/20.

## 2020-12-24 MED ORDER — LEVOCETIRIZINE DIHYDROCHLORIDE 5 MG PO TABS: 5.0000 mg | ORAL_TABLET | Freq: Every evening | ORAL | 5 refills | Status: DC

## 2020-12-24 NOTE — Telephone Encounter (Signed)
K sent in.

## 2020-12-27 ENCOUNTER — Telehealth: Payer: Self-pay

## 2020-12-27 NOTE — Telephone Encounter (Signed)
Called and left detailed message for patient to confirm banding appointment on Friday 10-28 at 3:40pm with Dr. Adela Lank.  She has cancelled 5 banding appts and needs to let us know if she still wants to pursue bandings. Asked that she call to confirm appointment and plan to be here around 3:30 pm or cancel that appointment if no longer interested

## 2021-01-14 ENCOUNTER — Ambulatory Visit: Payer: Medicaid Other | Admitting: Gastroenterology

## 2021-01-14 ENCOUNTER — Encounter: Payer: Self-pay | Admitting: Gastroenterology

## 2021-01-14 VITALS — BP 140/110 | HR 112 | Ht 67.25 in | Wt 239.1 lb

## 2021-01-14 DIAGNOSIS — K642 Third degree hemorrhoids: Secondary | ICD-10-CM

## 2021-01-14 MED ORDER — PROMETHAZINE HCL 25 MG PO TABS: 25.0000 mg | ORAL_TABLET | Freq: Four times a day (QID) | ORAL | 1 refills | Status: DC | PRN

## 2021-01-14 MED ORDER — LINACLOTIDE 145 MCG PO CAPS: 145.0000 ug | ORAL_CAPSULE | Freq: Every day | ORAL | 2 refills | Status: DC

## 2021-01-14 NOTE — Progress Notes (Signed)
40 year old female referred here to see me today for hemorrhoid banding from Dr. Meridee Score.  She has had longstanding hemorrhoids, grade 3 prolapse, that bleed intermittently and cause irritation.  She has had a colonoscopy in October 2021 confirming internal hemorrhoids as a cause for her symptoms.  She has had these for several years since her children were born and she is requesting more definitive therapy.  We discussed hemorrhoid banding, risks and benefits.  She wishes to proceed.  She does have some chronic constipation for which she takes Linzess as needed.  She has not been using it more recently and has been slightly constipated recently.   Colonoscopy 01/06/2020: The digital rectal exam findings include hemorrhoids. Pertinent negatives include no palpable rectal lesions. Findings: - Normal mucosa was found in the entire colon. - Non-bleeding non-thrombosed external and internal hemorrhoids were found during retroflexion, during perianal exam and during digital exam. The hemorrhoids were Grade II (internal hemorrhoids that prolapse but reduce spontaneously).  PROCEDURE NOTE: The patient presents with symptomatic grade III  hemorrhoids, requesting rubber band ligation of his/her hemorrhoidal disease.  All risks, benefits and alternative forms of therapy were described and informed consent was obtained.  Anoscopy showed inflamed hemorrhoids in all areas with largest / most prolapse in the RP area on anoscopy.. The anorectum was pre-medicated with 0.125% nitroglycerin The decision was made to band the RP internal hemorrhoid, and the [facility] O'Regan System was used to perform band ligation without complication.  Digital anorectal examination was then performed to assure proper positioning of the band, and to adjust the banded tissue as required.  The patient was discharged home without pain or other issues.  Dietary and behavioral recommendations were given and along with follow-up  instructions.     The following adjunctive treatments were recommended: Resume linzess, prevent constipation/straining  The patient will return in 2-4 weeks for  follow-up and possible additional banding as required. No complications were encountered and the patient tolerated the procedure well.  Harlin Rain, MD [facility]

## 2021-01-14 NOTE — Patient Instructions (Addendum)
If you are age 41 or older, your body mass index should be between 23-30. Your Body mass index is 37.17 kg/m. If this is out of the aforementioned range listed, please consider follow up with your Primary Care Provider.  If you are age 38 or younger, your body mass index should be between 19-25. Your Body mass index is 37.17 kg/m. If this is out of the aformentioned range listed, please consider follow up with your Primary Care Provider.   ________________________________________________________  The [place] [facility] providers would like to encourage you to use [facility] to communicate with providers for non-urgent requests or questions.  Due to long hold times on the telephone, sending your provider a message by [facility] may be a faster and more efficient way to get a response.  Please allow 48 business hours for a response.  Please remember that this is for non-urgent requests.  _______________________________________________________   Lions PROCEDURE    FOLLOW-UP CARE   The procedure you have had should have been relatively painless since the banding of the area involved does not have nerve endings and there is no pain sensation.  The rubber band cuts off the blood supply to the hemorrhoid and the band may fall off as soon as 48 hours after the banding (the band may occasionally be seen in the toilet bowl following a bowel movement). You may notice a temporary feeling of fullness in the rectum which should respond adequately to plain Tylenol or Motrin.  Following the banding, avoid strenuous exercise that evening and resume full activity the next day.  A sitz bath (soaking in a warm tub) or bidet is soothing, and can be useful for cleansing the area after bowel movements.     To avoid constipation, take two tablespoons of natural wheat bran, natural oat bran, flax, Benefiber or any over the counter fiber supplement and increase your water intake to 7-8 glasses daily.    Unless you  have been prescribed anorectal medication, do not put anything inside your rectum for two weeks: No suppositories, enemas, fingers, etc.  Occasionally, you may have more bleeding than usual after the banding procedure.  This is often from the untreated hemorrhoids rather than the treated one.  Don't be concerned if there is a tablespoon or so of blood.  If there is more blood than this, lie flat with your bottom higher than your head and apply an ice pack to the area. If the bleeding does not stop within a half an hour or if you feel faint, call our office at [phone] or go to the emergency room.  Problems are not common; however, if there is a substantial amount of bleeding, severe pain, chills, fever or difficulty passing urine (very rare) or other problems, you should call us at [phone] or report to the nearest emergency room.  Do not stay seated continuously for more than 2-3 hours for a day or two after the procedure.  Tighten your buttock muscles 10-15 times every two hours and take 10-15 deep breaths every 1-2 hours.  Do not spend more than a few minutes on the toilet if you cannot empty your bowel; instead re-visit the toilet at a later time.    You are scheduled for your 2nd banding on Monday 11-14 at 3:40 pm.    We have sent the following medications to your pharmacy for you to pick up at your convenience: Linzess 145 mcg: Take once daily before breakfast Phenergan: Take every 6 hours  as needed  Thank you for entrusting me with your care and for choosing [facility], Dr. Ileene Patrick

## 2021-01-31 ENCOUNTER — Encounter: Payer: Self-pay | Admitting: Gastroenterology

## 2021-01-31 ENCOUNTER — Ambulatory Visit: Payer: Medicaid Other | Admitting: Gastroenterology

## 2021-01-31 VITALS — BP 122/100 | HR 92 | Ht 67.25 in | Wt 241.1 lb

## 2021-01-31 DIAGNOSIS — K642 Third degree hemorrhoids: Secondary | ICD-10-CM

## 2021-01-31 MED ORDER — AMBULATORY NON FORMULARY MEDICATION: 0 refills | Status: DC

## 2021-01-31 NOTE — Patient Instructions (Addendum)
If you are age 41 or older, your body mass index should be between 23-30. Your Body mass index is 37.49 kg/m. If this is out of the aforementioned range listed, please consider follow up with your Primary Care Provider.  If you are age 42 or younger, your body mass index should be between 19-25. Your Body mass index is 37.49 kg/m. If this is out of the aformentioned range listed, please consider follow up with your Primary Care Provider.   ________________________________________________________  The [place] [facility] providers would like to encourage you to use [facility] to communicate with providers for non-urgent requests or questions.  Due to long hold times on the telephone, sending your provider a message by [facility] may be a faster and more efficient way to get a response.  Please allow 48 business hours for a response.  Please remember that this is for non-urgent requests.  _______________________________________________________  [place] PROCEDURE    FOLLOW-UP CARE   The procedure you have had should have been relatively painless since the banding of the area involved does not have nerve endings and there is no pain sensation.  The rubber band cuts off the blood supply to the hemorrhoid and the band may fall off as soon as 48 hours after the banding (the band may occasionally be seen in the toilet bowl following a bowel movement). You may notice a temporary feeling of fullness in the rectum which should respond adequately to plain Tylenol or Motrin.  Following the banding, avoid strenuous exercise that evening and resume full activity the next day.  A sitz bath (soaking in a warm tub) or bidet is soothing, and can be useful for cleansing the area after bowel movements.     To avoid constipation, take two tablespoons of natural wheat bran, natural oat bran, flax, Benefiber or any over the counter fiber supplement and increase your water intake to 7-8 glasses daily.    Unless you  have been prescribed anorectal medication, do not put anything inside your rectum for two weeks: No suppositories, enemas, fingers, etc.  Occasionally, you may have more bleeding than usual after the banding procedure.  This is often from the untreated hemorrhoids rather than the treated one.  Don't be concerned if there is a tablespoon or so of blood.  If there is more blood than this, lie flat with your bottom higher than your head and apply an ice pack to the area. If the bleeding does not stop within a half an hour or if you feel faint, call our office at [phone] or go to the emergency room.  Problems are not common; however, if there is a substantial amount of bleeding, severe pain, chills, fever or difficulty passing urine (very rare) or other problems, you should call us at [phone] or report to the nearest emergency room.  Do not stay seated continuously for more than 2-3 hours for a day or two after the procedure.  Tighten your buttock muscles 10-15 times every two hours and take 10-15 deep breaths every 1-2 hours.  Do not spend more than a few minutes on the toilet if you cannot empty your bowel; instead re-visit the toilet at a later time. ___________________________________________________________  We have sent a prescription for nitroglycerin 0.125% gel to [facility]. You should apply a pea size amount to your rectum twice a day as needed.  [facility]'s information is below: Address: [address] [place], [place], [place] [address]  Phone:([phone]) [phone]  *  Please DO NOT go directly from our office to pick up this medication! Give the pharmacy 1 day to process the prescription as this is compounded and takes time to make. ___________________________________________________________  Dennis Bast are scheduled for your 3rd and final banding on Wednesday, 11-30 at 4:00 pm.  Thank you for entrusting me with your care and for choosing [facility], Dr. Spencer Cellar

## 2021-01-31 NOTE — Progress Notes (Signed)
40 year old female here for a follow up for hemorrhoid banding.  She has had longstanding hemorrhoids, grade 3 prolapse, that bleed intermittently and cause irritation.  She has had a colonoscopy in October 2021 confirming internal hemorrhoids as a cause for her symptoms.  She does have some chronic constipation for which she takes Linzess as needed.   After discussion of options we banded RP hemorrhoid on 01/14/21.  She tolerated it well at the time however she had some discomfort for a few days after the banding managed with sitz bath's.  She did not have any bleeding problems from it.  She states when she recovered from the banding her symptoms have generally improved and her hemorrhoids are not as bad.  She is happy with the progress and wishes to continue with another banding.  She does continue to have some grade 3 prolapse.  She does endorse a latex sensitivity but not a true latex allergy, I was not aware of this on her last banding.  We discussed options and she wants to proceed with banding using a latex free band today.   Colonoscopy 01/06/2020: The digital rectal exam findings include hemorrhoids. Pertinent negatives include no palpable rectal lesions. Findings: - Normal mucosa was found in the entire colon. - Non-bleeding non-thrombosed external and internal hemorrhoids were found during retroflexion, during perianal exam and during digital exam. The hemorrhoids were Grade II (internal hemorrhoids that prolapse but reduce spontaneously).   PROCEDURE NOTE: The patient presents with symptomatic grade III  hemorrhoids, requesting rubber band ligation of his/her hemorrhoidal disease.  All risks, benefits and alternative forms of therapy were described and informed consent was obtained.   The anorectum was pre-medicated with 0.125% nitroglycerin The decision was made to band the LL internal hemorrhoid, and the [facility] O'Regan System was used to perform band ligation without complication. LATEX  FREE BAND USED. Digital anorectal examination was then performed to assure proper positioning of the band, and to adjust the banded tissue as required.  The patient was discharged home without pain or other issues.  Dietary and behavioral recommendations were given and along with follow-up instructions.     The following adjunctive treatments were recommended: Continue linzess, prevent constipation/straining   The patient will return in 2-4 weeks for  follow-up and possible additional banding as required. No complications were encountered and the patient tolerated the procedure well.   Harlin Rain, MD [facility]

## 2021-02-14 ENCOUNTER — Encounter: Payer: Medicaid Other | Admitting: Gastroenterology

## 2021-02-15 ENCOUNTER — Other Ambulatory Visit: Payer: Self-pay | Admitting: Gastroenterology

## 2021-02-16 ENCOUNTER — Encounter: Payer: Self-pay | Admitting: Gastroenterology

## 2021-02-16 ENCOUNTER — Ambulatory Visit: Payer: Medicaid Other | Admitting: Gastroenterology

## 2021-02-16 VITALS — BP 136/88 | HR 105 | Ht 67.25 in | Wt 245.0 lb

## 2021-02-16 DIAGNOSIS — K642 Third degree hemorrhoids: Secondary | ICD-10-CM

## 2021-02-16 MED ORDER — LINACLOTIDE 290 MCG PO CAPS: 290.0000 ug | ORAL_CAPSULE | Freq: Every day | ORAL | 1 refills | Status: DC

## 2021-02-16 NOTE — Progress Notes (Signed)
39 year old female here for a follow up for hemorrhoid banding.  She has had longstanding hemorrhoids, grade 3 prolapse, that bleed intermittently and cause irritation.  She has had a colonoscopy in October 2021 confirming internal hemorrhoids as a cause for her symptoms.  She does have some chronic constipation for which she takes Linzess as needed.   After discussion of options we banded RP hemorrhoid on 01/14/21.  She does endorse a latex sensitivity but not a true latex allergy, I was not aware of this on her first banding.  Second banding was of LL hemorrhoid done on 11/14.  She states she tolerated this much better than the first band and not much discomfort.  She did have some bleeding about 3 to 4 days after the band was placed but this abated on its own.  She does have occasional prolapse still but thinks her symptoms in general have improved following the 2 bands and wishes to proceed with final banding today after discussion of options.  She is needing to take 2 Linzess per day, total 290 mcg/day to treat her constipation.  She asked for refills of that today to keep stools soft and prevent constipation.   Colonoscopy 01/06/2020: The digital rectal exam findings include hemorrhoids. Pertinent negatives include no palpable rectal lesions. Findings: - Normal mucosa was found in the entire colon. - Non-bleeding non-thrombosed external and internal hemorrhoids were found during retroflexion, during perianal exam and during digital exam. The hemorrhoids were Grade II (internal hemorrhoids that prolapse but reduce spontaneously).    PROCEDURE NOTE: The patient presents with symptomatic grade III  hemorrhoids, requesting rubber band ligation of his/her hemorrhoidal disease.  All risks, benefits and alternative forms of therapy were described and informed consent was obtained.  The anorectum was pre-medicated with 0.125% The decision was made to band the RA internal hemorrhoid, and the [facility]  O'Regan System was used to perform band ligation without complication.  Digital anorectal examination was then performed to assure proper positioning of the band, and to adjust the banded tissue as required.  The patient was discharged home without pain or other issues.  Dietary and behavioral recommendations were given and along with follow-up instructions.     The following adjunctive treatments were recommended: Increase Linzess to / day  The patient will return in 6 weeks  for  follow-up and assessment of her course after completing the banding series.  We discussed that goal of banding is to minimize symptoms and hopefully avoiding surgery.  She is definitely better after the first 2 banding's but unclear if this will completely eliminate her symptoms or not.  We will discuss at her next visit whether or not she will need surgical evaluation or if she has responded appropriately enough to the banding at that time.. No complications were encountered and the patient tolerated the procedure well.   Harlin Rain, MD [facility]

## 2021-02-16 NOTE — Patient Instructions (Addendum)
If you are age 41 or older, your body mass index should be between 23-30. Your Body mass index is 38.09 kg/m. If this is out of the aforementioned range listed, please consider follow up with your Primary Care Provider.  If you are age 59 or younger, your body mass index should be between 19-25. Your Body mass index is 38.09 kg/m. If this is out of the aformentioned range listed, please consider follow up with your Primary Care Provider.   ________________________________________________________  The [place] [facility] providers would like to encourage you to use [facility] to communicate with providers for non-urgent requests or questions.  Due to long hold times on the telephone, sending your provider a message by [facility] may be a faster and more efficient way to get a response.  Please allow 48 business hours for a response.  Please remember that this is for non-urgent requests.  _______________________________________________________  Angie [place] PROCEDURE    FOLLOW-UP CARE   The procedure you have had should have been relatively painless since the banding of the area involved does not have nerve endings and there is no pain sensation.  The rubber band cuts off the blood supply to the hemorrhoid and the band may fall off as soon as 48 hours after the banding (the band may occasionally be seen in the toilet bowl following a bowel movement). You may notice a temporary feeling of fullness in the rectum which should respond adequately to plain Tylenol or Motrin.  Following the banding, avoid strenuous exercise that evening and resume full activity the next day.  A sitz bath (soaking in a warm tub) or bidet is soothing, and can be useful for cleansing the area after bowel movements.     To avoid constipation, take two tablespoons of natural wheat bran, natural oat bran, flax, Benefiber or any over the counter fiber supplement and increase your water intake to 7-8 glasses daily.    Unless you  have been prescribed anorectal medication, do not put anything inside your rectum for two weeks: No suppositories, enemas, fingers, etc.  Occasionally, you may have more bleeding than usual after the banding procedure.  This is often from the untreated hemorrhoids rather than the treated one.  Don't be concerned if there is a tablespoon or so of blood.  If there is more blood than this, lie flat with your bottom higher than your head and apply an ice pack to the area. If the bleeding does not stop within a half an hour or if you feel faint, call our office at [phone] or go to the emergency room.  Problems are not common; however, if there is a substantial amount of bleeding, severe pain, chills, fever or difficulty passing urine (very rare) or other problems, you should call us at [phone] or report to the nearest emergency room.  Do not stay seated continuously for more than 2-3 hours for a day or two after the procedure.  Tighten your buttock muscles 10-15 times every two hours and take 10-15 deep breaths every 1-2 hours.  Do not spend more than a few minutes on the toilet if you cannot empty your bowel; instead re-visit the toilet at a later time.   We have sent the following medications to your pharmacy for you to pick up at your convenience: Linzess 290 mcg: Take once daily before breakfast  We have scheduled you for a follow up appointment on Tuesday, 04-05-21 at 10:30 am.   Thank you for entrusting  me with your care and for choosing [facility], Dr. Ileene Brown

## 2021-02-18 ENCOUNTER — Telehealth: Payer: Self-pay | Admitting: Gastroenterology

## 2021-02-18 NOTE — Telephone Encounter (Signed)
Patient called and states that her Nitroglycerine ointment is not working and is seeking advice. Please advise.

## 2021-02-21 NOTE — Telephone Encounter (Signed)
LM for patient to call back.

## 2021-03-01 ENCOUNTER — Other Ambulatory Visit: Payer: Self-pay | Admitting: Gastroenterology

## 2021-04-04 ENCOUNTER — Telehealth: Payer: Self-pay | Admitting: Gastroenterology

## 2021-04-04 NOTE — Telephone Encounter (Signed)
Good Morning Dr. Havery Moros,  Patient called to cancel appointment for tomorrow at 10:30 due to having the flu.   Patient was rescheduled for 2/21 at 1:40.

## 2021-04-05 ENCOUNTER — Ambulatory Visit: Payer: Medicaid Other | Admitting: Gastroenterology

## 2021-04-05 ENCOUNTER — Ambulatory Visit: Payer: Medicaid Other

## 2021-04-08 ENCOUNTER — Other Ambulatory Visit: Payer: Self-pay | Admitting: Gastroenterology

## 2021-04-16 ENCOUNTER — Telehealth: Payer: Medicaid Other | Admitting: Emergency Medicine

## 2021-04-16 DIAGNOSIS — Z91199 Patient's noncompliance with other medical treatment and regimen due to unspecified reason: Secondary | ICD-10-CM

## 2021-04-16 NOTE — Progress Notes (Signed)
The patient no-showed for appointment despite this provider sending direct link, reaching out via phone with no response and waiting for at least 10 minutes from appointment time for patient to join. They will be marked as a NS for this appointment/time.  

## 2021-05-09 ENCOUNTER — Other Ambulatory Visit: Payer: Self-pay | Admitting: Gastroenterology

## 2021-05-10 ENCOUNTER — Ambulatory Visit: Payer: Medicaid Other | Admitting: Gastroenterology

## 2021-05-20 DIAGNOSIS — Z0000 Encounter for general adult medical examination without abnormal findings: Secondary | ICD-10-CM | POA: Insufficient documentation

## 2021-05-25 ENCOUNTER — Other Ambulatory Visit: Payer: Self-pay | Admitting: Gastroenterology

## 2021-05-26 ENCOUNTER — Telehealth: Payer: Self-pay

## 2021-05-26 ENCOUNTER — Telehealth: Payer: Self-pay | Admitting: Gastroenterology

## 2021-05-26 NOTE — Telephone Encounter (Signed)
Patient called today stating that her recent bloodwork is elevated - liver enzymes, gluocse, and thyroid.  She also had some tenderness around the liver upon examination.  She has an appointment scheduled for 4/11 with Dr. Meridee Score.  Please let her know if she will need to do more labs prior to that appointment.  Thank you. ?

## 2021-05-26 NOTE — Telephone Encounter (Signed)
Patient returned call with complaints of tenderness close to her liver. She has a follow up soon with Dr. Rush Landmark, and would like to know if she needs to come in for further lab work prior to her appt on 06/28/21. ? ?Dr. Rush Landmark, please advise. Thank you!  ?

## 2021-05-26 NOTE — Telephone Encounter (Signed)
Left message for patient to call back  

## 2021-05-27 ENCOUNTER — Other Ambulatory Visit: Payer: Self-pay

## 2021-05-27 DIAGNOSIS — R7989 Other specified abnormal findings of blood chemistry: Secondary | ICD-10-CM

## 2021-05-27 DIAGNOSIS — K760 Fatty (change of) liver, not elsewhere classified: Secondary | ICD-10-CM

## 2021-05-27 NOTE — Telephone Encounter (Signed)
Patient notified via MyChart per patient's request. Labs have been ordered.  ?

## 2021-05-27 NOTE — Telephone Encounter (Signed)
Two active telephone messages, so closing this one and going to write recommendations on the other. ?GM ?

## 2021-05-27 NOTE — Telephone Encounter (Signed)
If she can get labs a week before her clinic visit to help guide discussions at clinic that would be helpful. ?CMP/GGT/INR/CPK. ?With her workup having been completed, mostly in the last few years with recurrence of abnormal LFTs and pattern on MRI/MRCP of steatosis, this will likely end up being likely fatty liver disease, however, to be absolutely sure we will discuss a liver biopsy in the future. ?Thanks. ?GM ?

## 2021-06-03 ENCOUNTER — Other Ambulatory Visit: Payer: Self-pay | Admitting: Gastroenterology

## 2021-06-10 ENCOUNTER — Telehealth: Payer: Self-pay

## 2021-06-10 NOTE — Telephone Encounter (Signed)
Notification received that patient has not viewed message regarding the need for lab work 1 week prior to her OV. Called to remind patient, left detailed message on voicemail.  ?

## 2021-06-13 ENCOUNTER — Other Ambulatory Visit: Payer: Self-pay | Admitting: Gastroenterology

## 2021-06-15 ENCOUNTER — Ambulatory Visit: Payer: Medicaid Other | Admitting: Allergy

## 2021-06-15 NOTE — Progress Notes (Deleted)
? ?Follow Up Note ? ?RE: [name] MRN: [id number] DOB: 11-Feb-1981 ?Date of Office Visit: 06/15/2021 ? ?Referring provider: Laqueta Due., MD ?Primary care provider: Laqueta Due., MD ? ?Chief Complaint: No chief complaint on file. ? ?History of Present Illness: ?I had the pleasure of seeing Angie Brown for a follow up visit at the [facility] on 06/15/2021. She is a 41 y.o. female, who is being followed for adverse food reaction, reactive airway disease, chronic rhinitis and hymenoptera allergy. Her previous allergy office visit was on 12/16/2020 with Dr. Selena Batten. Today is a regular follow up visit. ? ?Adverse food reaction ?Patient follows with GI and concerned whether she has alpha gal allergy. Does not recall being bitten by ticks recently. No immediate symptoms after eating red meat. Eggs cause indigestion. ?Check alpha-gal panel for alpha-gal allergy. Given clinical history low suspicion of alpha-gal allergy. ?Today's skin testing was negative to common foods but positive control was only borderline reactive.  ?Continue to avoid red meat until bloodwork results are back.  ?Continue diet recommended by GI. ?  ?Reactive airway disease without complication ?Uses albuterol in the spring and fall due to chest tightness and coughing. ?Today's spirometry was normal. ?May use albuterol rescue inhaler 2 puffs every 4 to 6 hours as needed for shortness of breath, chest tightness, coughing, and wheezing. Monitor frequency of use.  ?  ?Chronic rhinitis ?Symptoms in the spring and fall. Takes hydroxyzine, Singulair and Flonase prn with good benefit. Intolerant of Claritin. ?Today's skin testing showed: Negative to indoor/outdoor allergens but positive control was not very reactive. ?Get bloodwork for environmental allergy panel.  ?Use over the counter antihistamines such as Zyrtec (cetirizine), Allegra (fexofenadine), or Xyzal (levocetirizine) daily as needed.  May switch antihistamines every few  months. ?Continue Singulair (montelukast) 10mg  daily at night. ?Use Flonase (fluticasone) nasal spray 1 spray per nostril twice a day as needed for nasal congestion.  ?Nasal saline spray (i.e., Simply Saline) or nasal saline lavage (i.e., NeilMed) is recommended as needed and prior to medicated nasal sprays. ?  ?Hymenoptera allergy ?Patient hospitalized in 2020 after stung by a wasp leading to pulselessness and needing multiple rounds of epi with epi drip. She started venom immunotherapy but was stopped due to facial swelling on the lowest dilution.  ?Continue to avoid. ?Get bloodwork for hymenoptera panel.  ?If positive, will discuss starting venom immunotherapy. ?If negative, recommend undergoing skin testing in our [facility] office next due to strong clinical history.  ?I have prescribed epinephrine injectable device and demonstrated proper use. For mild symptoms you can take over the counter antihistamines such as Benadryl and monitor symptoms closely. If symptoms worsen or if you have severe symptoms including breathing issues, throat closure, significant swelling, whole body hives, severe diarrhea and vomiting, lightheadedness then inject epinephrine and seek immediate medical care afterwards. ?Emergency action plan given. ?  ?Return in about 6 months (around 06/15/2021). ?  ? ?Assessment and Plan: ?Angie Brown is a 41 y.o. female with: ?No problem-specific Assessment & Plan notes found for this encounter. ? ?No follow-ups on file. ? ?No orders of the defined types were placed in this encounter. ? ?Lab Orders  ?No laboratory test(s) ordered today  ? ? ?Diagnostics: ?Spirometry:  ?Tracings reviewed. Her effort: {Blank single:19197::"Good reproducible efforts.","It was hard to get consistent efforts and there is a question as to whether this reflects a maximal maneuver.","Poor effort, data can not be interpreted."} ?FVC: ***L ?FEV1: ***L, ***% predicted ?FEV1/FVC ratio: ***% ?  Interpretation: {Blank  single:19197::"Spirometry consistent with mild obstructive disease","Spirometry consistent with moderate obstructive disease","Spirometry consistent with severe obstructive disease","Spirometry consistent with possible restrictive disease","Spirometry consistent with mixed obstructive and restrictive disease","Spirometry uninterpretable due to technique","Spirometry consistent with normal pattern","No overt abnormalities noted given today's efforts"}.  ?Please see scanned spirometry results for details. ? ?Skin Testing: {Blank single:19197::"Select foods","Environmental allergy panel","Environmental allergy panel and select foods","Food allergy panel","None","Deferred due to recent antihistamines use"}. ?*** ?Results discussed with patient/family. ? ? ?Medication List:  ?Current Outpatient Medications  ?Medication Sig Dispense Refill  ?? albuterol (PROVENTIL) (2.5 MG/3ML) 0.083% nebulizer solution Take 2.5 mg by nebulization every 6 (six) hours as needed for wheezing or shortness of breath.     ?? albuterol (VENTOLIN HFA) 108 (90 Base) MCG/ACT inhaler Inhale 1-2 puffs into the lungs every 6 (six) hours as needed for wheezing or shortness of breath. 18 g 0  ?? AMBULATORY NON FORMULARY MEDICATION Medication Name: Nitroglycerine ointment 0.125 %  ?Apply a pea sized amount internally twice a dayas needed ?Dispense 30 GM zero refill 30 g 0  ?? clonazePAM (KLONOPIN) 1 MG tablet Take 1 mg by mouth 2 (two) times daily.    ?? Dexlansoprazole 30 MG capsule DR TAKE 1 CAPSULE (30 MG TOTAL) BY MOUTH DAILY. 30 capsule 3  ?? dicyclomine (BENTYL) 10 MG capsule Take 1 capsule (10 mg total) by mouth 4 (four) times daily -  before meals and at bedtime. 90 capsule 3  ?? diphenhydrAMINE (BENADRYL) 25 mg capsule Take 25 mg by mouth at bedtime as needed for allergies.     ?? DULoxetine (CYMBALTA) 60 MG capsule Take 60 mg by mouth daily.    ?? EPINEPHrine 0.3 mg/0.3 mL IJ SOAJ injection Inject 0.3 mg into the muscle as needed for  anaphylaxis. 1 each 2  ?? fluticasone (FLONASE) 50 MCG/ACT nasal spray Place 1 spray into both nostrils 2 (two) times daily as needed (nasal congestion). 16 g 5  ?? folic acid (FOLVITE) 1 MG tablet Take 1 mg by mouth daily.    ?? hydrOXYzine (VISTARIL) 25 MG capsule Take 25 mg by mouth 3 (three) times daily as needed for anxiety.    ?? levocetirizine (XYZAL) 5 MG tablet Take 1 tablet (5 mg total) by mouth every evening. 30 tablet 5  ?? linaclotide (LINZESS) 290 MCG CAPS capsule Take 1 capsule (290 mcg total) by mouth daily before breakfast. 90 capsule 1  ?? montelukast (SINGULAIR) 10 MG tablet Take 1 tablet (10 mg total) by mouth at bedtime. 30 tablet 5  ?? promethazine (PHENERGAN) 25 MG tablet TAKE 1 TABLET BY MOUTH EVERY 6 HOURS AS NEEDED. 30 tablet 0  ?? rizatriptan (MAXALT-MLT) 10 MG disintegrating tablet DISSOLVE 1 TABLET ON THE TONGUE AS NEEDED FOR MIGRAINE. MAY REPEAT IN 2 HOURS IF NEEDED.    ?? sucralfate (CARAFATE) 1 GM/10ML suspension Take 10 mLs (1 g total) by mouth 2 (two) times daily. 420 mL 1  ?? TRANSDERM-SCOP, 1.5 MG, 1 MG/3DAYS Place 1 patch (1.5 mg total) onto the skin every 3 (three) days. 10 patch 12  ?? traZODone (DESYREL) 100 MG tablet Take 100-200 mg by mouth at bedtime.    ?? Vitamin D, Ergocalciferol, (DRISDOL) 1.25 MG (50000 UNIT) CAPS capsule Take 50,000 Units by mouth once a week.    ? ?No current facility-administered medications for this visit.  ? ?Allergies: ?Allergies  ?Allergen Reactions  ?? Loratadine Shortness Of Breath, Swelling and Palpitations  ?  hypoventilation   ?? Wasp Venom Anaphylaxis  ?? Latex  Itching  ?? Sumatriptan Other (See Comments)  ?  sinus pain, sinus on fire and face hurts  ?? Tape Other (See Comments)  ?  unknown  ?? Doxycycline Nausea And Vomiting  ?? Ibuprofen Nausea And Vomiting  ?  GI upset  ?? Methocarbamol Rash  ? ?I reviewed her past medical history, social history, family history, and environmental history and no significant changes have been reported  from her previous visit. ? ?Review of Systems  ?Constitutional:  Negative for appetite change, chills, fever and unexpected weight change.  ?HENT:  Positive for congestion, postnasal drip and sinus pressure. Negative for rhinorrhea.   ?Ey

## 2021-06-23 ENCOUNTER — Other Ambulatory Visit (INDEPENDENT_AMBULATORY_CARE_PROVIDER_SITE_OTHER): Payer: Medicaid Other

## 2021-06-23 DIAGNOSIS — R110 Nausea: Secondary | ICD-10-CM

## 2021-06-23 DIAGNOSIS — R7989 Other specified abnormal findings of blood chemistry: Secondary | ICD-10-CM

## 2021-06-23 DIAGNOSIS — K760 Fatty (change of) liver, not elsewhere classified: Secondary | ICD-10-CM

## 2021-06-23 LAB — CBC WITH DIFFERENTIAL/PLATELET
Basophils Absolute: 0.1 10*3/uL (ref 0.0–0.1)
Basophils Relative: 1.2 % (ref 0.0–3.0)
Eosinophils Absolute: 0.3 10*3/uL (ref 0.0–0.7)
Eosinophils Relative: 3 % (ref 0.0–5.0)
HCT: 42 % (ref 36.0–46.0)
Hemoglobin: 14.1 g/dL (ref 12.0–15.0)
Lymphocytes Relative: 24.6 % (ref 12.0–46.0)
Lymphs Abs: 2.1 10*3/uL (ref 0.7–4.0)
MCHC: 33.7 g/dL (ref 30.0–36.0)
MCV: 89.6 fl (ref 78.0–100.0)
Monocytes Absolute: 0.5 10*3/uL (ref 0.1–1.0)
Monocytes Relative: 5.3 % (ref 3.0–12.0)
Neutro Abs: 5.7 10*3/uL (ref 1.4–7.7)
Neutrophils Relative %: 65.9 % (ref 43.0–77.0)
Platelets: 334 10*3/uL (ref 150.0–400.0)
RBC: 4.69 Mil/uL (ref 3.87–5.11)
RDW: 13.7 % (ref 11.5–15.5)
WBC: 8.7 10*3/uL (ref 4.0–10.5)

## 2021-06-23 LAB — COMPREHENSIVE METABOLIC PANEL
ALT: 83 U/L — ABNORMAL HIGH (ref 0–35)
AST: 135 U/L — ABNORMAL HIGH (ref 0–37)
Albumin: 4.2 g/dL (ref 3.5–5.2)
Alkaline Phosphatase: 114 U/L (ref 39–117)
BUN: 7 mg/dL (ref 6–23)
CO2: 30 mEq/L (ref 19–32)
Calcium: 9.5 mg/dL (ref 8.4–10.5)
Chloride: 98 mEq/L (ref 96–112)
Creatinine, Ser: 0.83 mg/dL (ref 0.40–1.20)
GFR: 87.19 mL/min (ref >60.00)
Glucose, Bld: 87 mg/dL (ref 70–99)
Potassium: 4.4 mEq/L (ref 3.5–5.1)
Sodium: 137 mEq/L (ref 135–145)
Total Bilirubin: 0.5 mg/dL (ref 0.2–1.2)
Total Protein: 7.7 g/dL (ref 6.0–8.3)

## 2021-06-23 LAB — CK: Total CK: 73 U/L (ref 7–177)

## 2021-06-23 LAB — GAMMA GT: GGT: 81 U/L — ABNORMAL HIGH (ref 7–51)

## 2021-06-23 LAB — PROTIME-INR
INR: 1 ratio (ref 0.8–1.0)
Prothrombin Time: 11 s (ref 9.6–13.1)

## 2021-06-28 ENCOUNTER — Encounter: Payer: Self-pay | Admitting: Gastroenterology

## 2021-06-28 ENCOUNTER — Ambulatory Visit: Payer: Medicaid Other | Admitting: Gastroenterology

## 2021-06-28 ENCOUNTER — Other Ambulatory Visit (INDEPENDENT_AMBULATORY_CARE_PROVIDER_SITE_OTHER): Payer: Medicaid Other

## 2021-06-28 VITALS — BP 136/78 | HR 84 | Ht 67.0 in | Wt 265.0 lb

## 2021-06-28 DIAGNOSIS — R109 Unspecified abdominal pain: Secondary | ICD-10-CM

## 2021-06-28 DIAGNOSIS — R110 Nausea: Secondary | ICD-10-CM | POA: Diagnosis not present

## 2021-06-28 DIAGNOSIS — R5383 Other fatigue: Secondary | ICD-10-CM | POA: Diagnosis not present

## 2021-06-28 DIAGNOSIS — R1011 Right upper quadrant pain: Secondary | ICD-10-CM

## 2021-06-28 DIAGNOSIS — R21 Rash and other nonspecific skin eruption: Secondary | ICD-10-CM

## 2021-06-28 DIAGNOSIS — R7989 Other specified abnormal findings of blood chemistry: Secondary | ICD-10-CM | POA: Diagnosis not present

## 2021-06-28 DIAGNOSIS — R1114 Bilious vomiting: Secondary | ICD-10-CM | POA: Diagnosis not present

## 2021-06-28 DIAGNOSIS — F489 Nonpsychotic mental disorder, unspecified: Secondary | ICD-10-CM

## 2021-06-28 DIAGNOSIS — K625 Hemorrhage of anus and rectum: Secondary | ICD-10-CM

## 2021-06-28 DIAGNOSIS — R635 Abnormal weight gain: Secondary | ICD-10-CM

## 2021-06-28 LAB — FOLATE: Folate: 9.5 ng/mL (ref >5.9)

## 2021-06-28 LAB — CORTISOL: Cortisol, Plasma: 10.7 ug/dL

## 2021-06-28 LAB — VITAMIN B12: Vitamin B-12: 194 pg/mL — ABNORMAL LOW (ref 211–911)

## 2021-06-28 MED ORDER — CLONAZEPAM 1 MG PO TABS: ORAL_TABLET | ORAL | 0 refills | Status: DC

## 2021-06-28 NOTE — Patient Instructions (Addendum)
We have sent the following medications to your pharmacy for you to pick up at your convenience: ?Klonopin ( take prior to having liver bx as discussed with Dr.Mansouraty   ? ?You will be contacted by [facility] in the next 2 days to arrange a Liver Biopsy.  The number on your caller ID will be [phone], please answer when they call.  If you have not heard from them in 2 days please call [phone] to schedule.   ? ?Your provider has requested that you go to the basement level for lab work before leaving today. Press "B" on the elevator. The lab is located at the first door on the left as you exit the elevator. ? ? ?If you are age 42 or younger, your body mass index should be between 19-25. Your Body mass index is 41.5 kg/m?Marland Kitchen If this is out of the aformentioned range listed, please consider follow up with your Primary Care Provider.  ? ?________________________________________________________ ? ?The [place] providers would like to encourage you to use [facility] to communicate with providers for non-urgent requests or questions.  Due to long hold times on the telephone, sending your provider a message by [facility] may be a faster and more efficient way to get a response.  Please allow 48 business hours for a response.  Please remember that this is for non-urgent requests.  ?_______________________________________________________ ? ?Thank you for choosing me and [facility]. ? ?Dr. Rush Landmark ? ? ? ?

## 2021-07-01 ENCOUNTER — Encounter: Payer: Self-pay | Admitting: Gastroenterology

## 2021-07-01 DIAGNOSIS — R21 Rash and other nonspecific skin eruption: Secondary | ICD-10-CM | POA: Insufficient documentation

## 2021-07-01 DIAGNOSIS — R5383 Other fatigue: Secondary | ICD-10-CM | POA: Insufficient documentation

## 2021-07-01 DIAGNOSIS — R635 Abnormal weight gain: Secondary | ICD-10-CM | POA: Insufficient documentation

## 2021-07-01 DIAGNOSIS — F489 Nonpsychotic mental disorder, unspecified: Secondary | ICD-10-CM | POA: Insufficient documentation

## 2021-07-01 NOTE — Progress Notes (Signed)
? ?GASTROENTEROLOGY OUTPATIENT CLINIC VISIT  ? ?Primary Care Provider ?Karleen Hampshire., MD ?[phone] ?[place] [address] ?[phone] ? ?Patient Profile: ?Angie Brown is a 41 y.o. 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  The patient presents to the [facility] for an evaluation and management of problem(s) noted below: ? ?Problem List ?1. Elevated LFTs   ?2. Fatigue, unspecified type   ?3. Right upper quadrant abdominal pain   ?4. Bilious vomiting with nausea   ?5. Mental health problem   ?6. Rash and nonspecific skin eruption   ?7. BRBPR (bright red blood per rectum)   ?8. Weight gain finding   ? ? ?History of Present Illness ?Please see prior notes for full details of HPI. ? ?Interval History ?The patient returns for follow-up and she is accompanied by her family member.  Since my last visit, the patient has undergone hemorrhoidal banding with Dr. Havery Moros and this has improved her bright red blood per rectum from prior.  She still has some episodes but significantly less than prior.  Patient is still experiencing issues of recurrent right upper quadrant abdominal pain with associated nausea and vomiting at times.  She has been wondering whether her current antidepressant may be causing some of her symptoms.  She wants to work with her psychiatrist to see if she can transition to a different medication but she is not sure what that medication may be.  She had her liver biochemical tests performed through her PCP and these were then sent to [place] and we repeated them and they remain elevated when they had previously normalized.  She had undergone a significant work-up previously.  The patient remains on PPI therapy.  She remains on  antiemetic therapy.  She continues to have weight gain.  She is interested in trying to go through the healthy weight clinic through [facility] but is trying to get enough funds to begin that process.   The patient denies any issues with jaundice, scleral icterus, generalized pruritus, darkened/amber urine, clay-colored stools, LE edema, hematemesis, coffee-ground emesis, confusion.  She just wants to feel better.  She is having significant fatigue at this time.  She is staying in her house for hours at a time.  From an eating habit perspective she is just eating towards the end of the day.  She recently had a TSH checked by her PCP and has been initiated on low-dose levothyroxine to see if that will make any difference for her. ? ?GI Review of Systems ?Positive as above as well as continued bloating distention at times  ?Negative for odynophagia, dysphagia  ? ?Review of Systems ?General: Denies fevers/chills/unintentional weight loss ?Cardiovascular: Denies chest pain ?Pulmonary: Denies shortness of breath ?Gastroenterological: See HPI ?Genitourinary: Denies darkened urine ?Hematological: Denies easy bruising/bleeding ?Dermatological: Denies jaundice ?Psychological: Mood is down/depressed ? ? ?Medications ?Current Outpatient Medications  ?Medication Sig Dispense Refill  ? albuterol (PROVENTIL) (2.5 MG/3ML) 0.083% nebulizer solution Take 2.5 mg by nebulization every 6 (six) hours as needed for wheezing or shortness of breath.     ? albuterol (VENTOLIN HFA) 108 (90 Base) MCG/ACT inhaler Inhale 1-2 puffs into the lungs every 6 (six) hours as needed for wheezing or shortness of breath. 18 g 0  ? clonazePAM (KLONOPIN) 1 MG tablet Take 1 mg by mouth 2 (two) times daily.    ?  clonazePAM (KLONOPIN) 1 MG tablet Take 1 tablet by mouth prior to having liver biopsy. 2 tablet 0  ? Dexlansoprazole 30 MG capsule DR TAKE 1 CAPSULE (30 MG TOTAL) BY MOUTH DAILY. 30 capsule 3  ? dicyclomine (BENTYL) 10 MG capsule Take 1 capsule (10 mg  total) by mouth 4 (four) times daily -  before meals and at bedtime. 90 capsule 3  ? diphenhydrAMINE (BENADRYL) 25 mg capsule Take 25 mg by mouth at bedtime as needed for allergies.     ? DULoxetine (CYMBALTA) 60 MG capsule Take 60 mg by mouth daily.    ? EPINEPHrine 0.3 mg/0.3 mL IJ SOAJ injection Inject 0.3 mg into the muscle as needed for anaphylaxis. 1 each 2  ? fluticasone (FLONASE) 50 MCG/ACT nasal spray Place 1 spray into both nostrils 2 (two) times daily as needed (nasal congestion). 16 g 5  ? folic acid (FOLVITE) 1 MG tablet Take 1 mg by mouth daily.    ? hydrOXYzine (VISTARIL) 25 MG capsule Take 25 mg by mouth 3 (three) times daily as needed for anxiety.    ? levocetirizine (XYZAL) 5 MG tablet Take 1 tablet (5 mg total) by mouth every evening. 30 tablet 5  ? levothyroxine (SYNTHROID) 50 MCG tablet Take 50 mcg by mouth daily before breakfast.    ? linaclotide (LINZESS) 290 MCG CAPS capsule Take 1 capsule (290 mcg total) by mouth daily before breakfast. 90 capsule 1  ? montelukast (SINGULAIR) 10 MG tablet Take 1 tablet (10 mg total) by mouth at bedtime. 30 tablet 5  ? promethazine (PHENERGAN) 25 MG tablet TAKE 1 TABLET BY MOUTH EVERY 6 HOURS AS NEEDED. 30 tablet 0  ? rizatriptan (MAXALT-MLT) 10 MG disintegrating tablet DISSOLVE 1 TABLET ON THE TONGUE AS NEEDED FOR MIGRAINE. MAY REPEAT IN 2 HOURS IF NEEDED.    ? sucralfate (CARAFATE) 1 GM/10ML suspension Take 10 mLs (1 g total) by mouth 2 (two) times daily. 420 mL 1  ? TRANSDERM-SCOP, 1.5 MG, 1 MG/3DAYS Place 1 patch (1.5 mg total) onto the skin every 3 (three) days. 10 patch 12  ? Vitamin D, Ergocalciferol, (DRISDOL) 1.25 MG (50000 UNIT) CAPS capsule Take 50,000 Units by mouth once a week.    ? ?No current facility-administered medications for this visit.  ? ? ?Allergies ?Allergies  ?Allergen Reactions  ? Loratadine Shortness Of Breath, Swelling and Palpitations  ?  hypoventilation   ? Wasp Venom Anaphylaxis  ? Latex Itching  ? Sumatriptan Other (See  Comments)  ?  sinus pain, sinus on fire and face hurts  ? Tape Other (See Comments)  ?  unknown  ? Doxycycline Nausea And Vomiting  ? Ibuprofen Nausea And Vomiting  ?  GI upset  ? Methocarbamol Rash  ? ? ?Histories ?Past Medical History:  ?Diagnosis Date  ? Allergy   ? Wasps  ? Asthma   ? Bronchitis   ? Depression   ? Dysrhythmia   ? PVCs- stress related  ? Eczema   ? Family history of adverse reaction to anesthesia   ? Hypoglycemia   ? Hypothyroidism   ? PONV (postoperative nausea and vomiting)   ? pt prefers scop patch  ? Renal disorder   ? 2008  ? ?Past Surgical History:  ?Procedure Laterality Date  ? ABDOMINAL HYSTERECTOMY    ? ADENOIDECTOMY    ? BACK SURGERY  07/2009  ? spinal fusion L 5-S1  ? BIOPSY  12/07/2018  ? Procedure: BIOPSY;  Surgeon: [name], MD;  Location: [facility];  Service: Gastroenterology;;  ? BIOPSY  10/25/2020  ? Procedure: BIOPSY;  Surgeon: [name], MD;  Location: [facility];  Service: Gastroenterology;;  ? BLADDER SUSPENSION N/A 03/11/2014  ? Procedure: TRANSVAGINAL TAPE (TVT) PROCEDURE;  Surgeon: Cheri Fowler, MD;  Location: [facility];  Service: Gynecology;  Laterality: N/A;  ? CHOLECYSTECTOMY    ? ESOPHAGOGASTRODUODENOSCOPY (EGD) WITH PROPOFOL N/A 12/07/2018  ? Procedure: ESOPHAGOGASTRODUODENOSCOPY (EGD) WITH PROPOFOL;  Surgeon: Rush Landmark Telford Nab., MD;  Location: [facility];  Service: Gastroenterology;  Laterality: N/A;  ? ESOPHAGOGASTRODUODENOSCOPY (EGD) WITH PROPOFOL N/A 10/25/2020  ? Procedure: ESOPHAGOGASTRODUODENOSCOPY (EGD) WITH PROPOFOL;  Surgeon: Rush Landmark Telford Nab., MD;  Location: [facility];  Service: Gastroenterology;  Laterality: N/A;  ? TONSILLECTOMY AND ADENOIDECTOMY    ? TYMPANOSTOMY TUBE PLACEMENT    ? UPPER ESOPHAGEAL ENDOSCOPIC ULTRASOUND (EUS) N/A 10/25/2020  ? Procedure: UPPER ESOPHAGEAL ENDOSCOPIC ULTRASOUND (EUS);  Surgeon: [name], MD;  Location: [facility];  Service: Gastroenterology;  Laterality:  N/A;  ? ?Social History  ? ?Socioeconomic History  ? Marital status: Divorced  ?  Spouse name: Not on file  ? Number of children: 3  ? Years of education: Not on file  ? Highest education level: Not on file  ?Occupatio

## 2021-07-06 ENCOUNTER — Other Ambulatory Visit: Payer: Self-pay | Admitting: Gastroenterology

## 2021-07-06 ENCOUNTER — Other Ambulatory Visit: Payer: Self-pay | Admitting: Student

## 2021-07-07 ENCOUNTER — Ambulatory Visit (HOSPITAL_COMMUNITY)
Admission: RE | Admit: 2021-07-07 | Discharge: 2021-07-07 | Disposition: A | Discharge status: Home / Self Care | Payer: Medicaid Other | Source: Ambulatory Visit | Attending: Gastroenterology | Admitting: Gastroenterology

## 2021-07-07 ENCOUNTER — Encounter (HOSPITAL_COMMUNITY): Payer: Self-pay

## 2021-07-07 DIAGNOSIS — Z87891 Personal history of nicotine dependence: Secondary | ICD-10-CM | POA: Diagnosis not present

## 2021-07-07 DIAGNOSIS — K7581 Nonalcoholic steatohepatitis (NASH): Secondary | ICD-10-CM | POA: Insufficient documentation

## 2021-07-07 DIAGNOSIS — R7989 Other specified abnormal findings of blood chemistry: Secondary | ICD-10-CM | POA: Diagnosis present

## 2021-07-07 DIAGNOSIS — E785 Hyperlipidemia, unspecified: Secondary | ICD-10-CM | POA: Insufficient documentation

## 2021-07-07 DIAGNOSIS — E039 Hypothyroidism, unspecified: Secondary | ICD-10-CM | POA: Insufficient documentation

## 2021-07-07 DIAGNOSIS — R1011 Right upper quadrant pain: Secondary | ICD-10-CM

## 2021-07-07 DIAGNOSIS — R5383 Other fatigue: Secondary | ICD-10-CM

## 2021-07-07 DIAGNOSIS — R1114 Bilious vomiting: Secondary | ICD-10-CM

## 2021-07-07 LAB — CBC
HCT: 39.8 % (ref 36.0–46.0)
Hemoglobin: 14.3 g/dL (ref 12.0–15.0)
MCH: 31.6 pg (ref 26.0–34.0)
MCHC: 35.9 g/dL (ref 30.0–36.0)
MCV: 87.9 fL (ref 80.0–100.0)
Platelets: 343 10*3/uL (ref 150–400)
RBC: 4.53 MIL/uL (ref 3.87–5.11)
RDW: 13 % (ref 11.5–15.5)
WBC: 7.3 10*3/uL (ref 4.0–10.5)
nRBC: 0 % (ref 0.0–0.2)

## 2021-07-07 LAB — PROTIME-INR
INR: 1 (ref 0.8–1.2)
Prothrombin Time: 13 seconds (ref 11.4–15.2)

## 2021-07-07 MED ORDER — MIDAZOLAM HCL 2 MG/2ML IJ SOLN
INTRAMUSCULAR | Status: AC | PRN
  Administered 2021-07-07 (×2): 1 mg via INTRAVENOUS
  Administered 2021-07-07 (×2): 2 mg via INTRAVENOUS

## 2021-07-07 MED ORDER — FENTANYL CITRATE (PF) 100 MCG/2ML IJ SOLN
INTRAMUSCULAR | Status: AC | PRN
  Administered 2021-07-07 (×4): 50 ug via INTRAVENOUS

## 2021-07-07 MED ORDER — MIDAZOLAM HCL 2 MG/2ML IJ SOLN
INTRAMUSCULAR | Status: AC
  Filled 2021-07-07: qty 2

## 2021-07-07 MED ORDER — PROMETHAZINE HCL 25 MG PO TABS
25.0000 mg | ORAL_TABLET | Freq: Four times a day (QID) | ORAL | Status: DC | PRN
  Administered 2021-07-07: 25 mg via ORAL
  Filled 2021-07-07 (×2): qty 1

## 2021-07-07 MED ORDER — HYDROCODONE-ACETAMINOPHEN 5-325 MG PO TABS
1.0000 | ORAL_TABLET | ORAL | Status: DC | PRN
  Administered 2021-07-07: 1 via ORAL
  Filled 2021-07-07: qty 1

## 2021-07-07 MED ORDER — FENTANYL CITRATE (PF) 100 MCG/2ML IJ SOLN
INTRAMUSCULAR | Status: AC
  Filled 2021-07-07: qty 2

## 2021-07-07 MED ORDER — SODIUM CHLORIDE 0.9 % IV SOLN: INTRAVENOUS | Status: DC

## 2021-07-07 MED ORDER — GELATIN ABSORBABLE 12-7 MM EX MISC
CUTANEOUS | Status: AC
  Administered 2021-07-07: 1
  Filled 2021-07-07: qty 1

## 2021-07-07 MED ORDER — MIDAZOLAM HCL 2 MG/2ML IJ SOLN
INTRAMUSCULAR | Status: AC
  Filled 2021-07-07: qty 4

## 2021-07-07 MED ORDER — LIDOCAINE HCL 1 % IJ SOLN
INTRAMUSCULAR | Status: AC
  Administered 2021-07-07: 20 mL
  Filled 2021-07-07: qty 20

## 2021-07-07 MED ORDER — LIDOCAINE HCL (PF) 1 % IJ SOLN
INTRAMUSCULAR | Status: AC | PRN
  Administered 2021-07-07: 10 mL
  Administered 2021-07-07: 10 mg

## 2021-07-07 NOTE — Procedures (Signed)
Interventional Radiology Procedure: ? ? ?Indications: Elevated LFTs ? ?Procedure: US guided liver biopsy ? ?Findings: 3 cores from left hepatic lobe. ? ?Complications: No immediate complications noted. ?    ?EBL: Minimal ? ?Plan: Bedrest 3 hours ? ? ?Angie Brown R. Anselm Pancoast, MD  ?Pager: [phone] ? ? ? ?  ?

## 2021-07-07 NOTE — Consult Note (Addendum)
? ?Chief Complaint: ?Patient was seen in consultation today for  image guided random liver biopsy ? ?Referring Physician(s): ?Mansouraty,Gabriel Jr. ? ?Supervising Physician: Markus Daft ? ?Patient Status: [place] ? ?History of Present Illness: ?Angie Brown is a 41 y.o. female with past medical history of asthma /bronchitis, anxiety/depression, hypothyroidism, eczema, hyperlipidemia, prior cholecystectomy.  She presents now with fatigue, right upper quadrant abdominal discomfort, cyclical nausea vomiting, elevated LFTs and fatty liver by imaging.  She  is scheduled today for image guided random core liver biopsy for further evaluation. ? ?Past Medical History:  ?Diagnosis Date  ? Allergy   ? Wasps  ? Asthma   ? Bronchitis   ? Depression   ? Dysrhythmia   ? PVCs- stress related  ? Eczema   ? Family history of adverse reaction to anesthesia   ? Hypoglycemia   ? Hypothyroidism   ? PONV (postoperative nausea and vomiting)   ? pt prefers scop patch  ? Renal disorder   ? 2008  ? ? ?Past Surgical History:  ?Procedure Laterality Date  ? ABDOMINAL HYSTERECTOMY    ? ADENOIDECTOMY    ? BACK SURGERY  07/2009  ? spinal fusion L 5-S1  ? BIOPSY  12/07/2018  ? Procedure: BIOPSY;  Surgeon: [name], MD;  Location: [facility];  Service: Gastroenterology;;  ? BIOPSY  10/25/2020  ? Procedure: BIOPSY;  Surgeon: [name], MD;  Location: [facility];  Service: Gastroenterology;;  ? BLADDER SUSPENSION N/A 03/11/2014  ? Procedure: TRANSVAGINAL TAPE (TVT) PROCEDURE;  Surgeon: Cheri Fowler, MD;  Location: [facility];  Service: Gynecology;  Laterality: N/A;  ? CHOLECYSTECTOMY    ? ESOPHAGOGASTRODUODENOSCOPY (EGD) WITH PROPOFOL N/A 12/07/2018  ? Procedure: ESOPHAGOGASTRODUODENOSCOPY (EGD) WITH PROPOFOL;  Surgeon: Rush Landmark Telford Nab., MD;  Location: [facility];  Service: Gastroenterology;  Laterality: N/A;  ? ESOPHAGOGASTRODUODENOSCOPY (EGD) WITH PROPOFOL N/A 10/25/2020  ? Procedure:  ESOPHAGOGASTRODUODENOSCOPY (EGD) WITH PROPOFOL;  Surgeon: Rush Landmark Telford Nab., MD;  Location: [facility];  Service: Gastroenterology;  Laterality: N/A;  ? TONSILLECTOMY AND ADENOIDECTOMY    ? TYMPANOSTOMY TUBE PLACEMENT    ? UPPER ESOPHAGEAL ENDOSCOPIC ULTRASOUND (EUS) N/A 10/25/2020  ? Procedure: UPPER ESOPHAGEAL ENDOSCOPIC ULTRASOUND (EUS);  Surgeon: [name], MD;  Location: [facility];  Service: Gastroenterology;  Laterality: N/A;  ? ? ?Allergies: ?Loratadine, Wasp venom, Latex, Sumatriptan, Tape, Doxycycline, Ibuprofen, and Methocarbamol ? ?Medications: ?Prior to Admission medications   ?Medication Sig Start Date End Date Taking? Authorizing Provider  ?clonazePAM (KLONOPIN) 1 MG tablet Take 1 mg by mouth 2 (two) times daily. 06/18/18  Yes [provider]  ?Dexlansoprazole 30 MG capsule DR TAKE 1 CAPSULE (30 MG TOTAL) BY MOUTH DAILY. 02/15/21  Yes Mansouraty, Telford Nab., MD  ?DULoxetine (CYMBALTA) 60 MG capsule Take 60 mg by mouth daily.   Yes [provider]  ?folic acid (FOLVITE) 1 MG tablet Take 1 mg by mouth daily. 09/09/19  Yes [provider]  ?hydrOXYzine (VISTARIL) 25 MG capsule Take 25 mg by mouth 3 (three) times daily as needed for anxiety.   Yes [provider]  ?levocetirizine (XYZAL) 5 MG tablet Take 1 tablet (5 mg total) by mouth every evening. 12/24/20  Yes Garnet Sierras, DO  ?levothyroxine (SYNTHROID) 50 MCG tablet Take 50 mcg by mouth daily before breakfast.   Yes [provider]  ?linaclotide (LINZESS) 290 MCG CAPS capsule Take 1 capsule (290 mcg total) by mouth daily before breakfast. 02/16/21  Yes Armbruster, Carlota Raspberry, MD  ?Melatonin  10 MG CAPS Take 1 capsule by mouth as needed.   Yes [provider]  ?montelukast (SINGULAIR) 10 MG tablet Take 1 tablet (10 mg total) by mouth at bedtime. 12/16/20  Yes Garnet Sierras, DO  ?promethazine (PHENERGAN) 25 MG tablet TAKE 1 TABLET BY MOUTH EVERY 6 HOURS AS NEEDED. 06/14/21  Yes  Mansouraty, Telford Nab., MD  ?rizatriptan (MAXALT-MLT) 10 MG disintegrating tablet DISSOLVE 1 TABLET ON THE TONGUE AS NEEDED FOR MIGRAINE. MAY REPEAT IN 2 HOURS IF NEEDED. 01/14/21  Yes [provider]  ?Vitamin D, Ergocalciferol, (DRISDOL) 1.25 MG (50000 UNIT) CAPS capsule Take 50,000 Units by mouth once a week. 09/09/19  Yes [provider]  ?albuterol (PROVENTIL) (2.5 MG/3ML) 0.083% nebulizer solution Take 2.5 mg by nebulization every 6 (six) hours as needed for wheezing or shortness of breath.  04/03/19   [provider]  ?albuterol (VENTOLIN HFA) 108 (90 Base) MCG/ACT inhaler Inhale 1-2 puffs into the lungs every 6 (six) hours as needed for wheezing or shortness of breath. 04/01/19   Hall-Potvin, [place], PA-C  ?clonazePAM (KLONOPIN) 1 MG tablet Take 1 tablet by mouth prior to having liver biopsy. 06/28/21   Mansouraty, Telford Nab., MD  ?dicyclomine (BENTYL) 10 MG capsule Take 1 capsule (10 mg total) by mouth 4 (four) times daily -  before meals and at bedtime. 01/08/20   Mansouraty, Telford Nab., MD  ?diphenhydrAMINE (BENADRYL) 25 mg capsule Take 25 mg by mouth at bedtime as needed for allergies.     [provider]  ?EPINEPHrine 0.3 mg/0.3 mL IJ SOAJ injection Inject 0.3 mg into the muscle as needed for anaphylaxis. 12/16/20   Garnet Sierras, DO  ?fluticasone (FLONASE) 50 MCG/ACT nasal spray Place 1 spray into both nostrils 2 (two) times daily as needed (nasal congestion). 12/16/20   Garnet Sierras, DO  ?sucralfate (CARAFATE) 1 GM/10ML suspension Take 10 mLs (1 g total) by mouth 2 (two) times daily. 10/25/20 10/25/21  Mansouraty, Telford Nab., MD  ?TRANSDERM-SCOP, 1.5 MG, 1 MG/3DAYS Place 1 patch (1.5 mg total) onto the skin every 3 (three) days. 10/14/20   Mansouraty, Telford Nab., MD  ?  ? ?Family History  ?Problem Relation Age of Onset  ? Asthma Mother   ? Allergic rhinitis Mother   ? Liver disease Mother   ?     Fatty liver, NASH  ? GI Bleed Mother   ? Hypothyroidism Mother   ? Allergic  rhinitis Father   ? Hypothyroidism Sister   ? Liver disease Sister   ?     fatty liver  ? Asthma Sister   ? Allergic rhinitis Sister   ? Hypothyroidism Sister   ? Liver disease Sister   ?     fatty liver  ? Thyroid cancer Sister   ? Liver cancer Maternal Grandfather   ? Liver disease Maternal Grandfather   ?     fatty liver  ? Colon cancer Neg Hx   ? Esophageal cancer Neg Hx   ? Rectal cancer Neg Hx   ? Inflammatory bowel disease Neg Hx   ? Pancreatic cancer Neg Hx   ? Stomach cancer Neg Hx   ? ? ?Social History  ? ?Socioeconomic History  ? Marital status: Divorced  ?  Spouse name: Not on file  ? Number of children: 3  ? Years of education: Not on file  ? Highest education level: Not on file  ?Occupational History  ? Occupation: housewife  ?Tobacco Use  ? Smoking status:  Former  ?  Packs/day: 0.50  ?  Types: Cigarettes  ? Smokeless tobacco: Never  ?Vaping Use  ? Vaping Use: Some days  ? Substances: Nicotine, Flavoring  ?Substance and Sexual Activity  ? Alcohol use: Not Currently  ? Drug use: No  ? Sexual activity: Yes  ?  Partners: Male  ?  Birth control/protection: Surgical  ?Other Topics Concern  ? Not on file  ?Social History Narrative  ? Not on file  ? ?Social Determinants of Health  ? ?Financial Resource Strain: Not on file  ?Food Insecurity: Not on file  ?Transportation Needs: Not on file  ?Physical Activity: Not on file  ?Stress: Not on file  ?Social Connections: Not on file  ? ? ? ? ?Review of Systems see above; currently denies fever, headache, chest pain, dyspnea, cough, back pain, or bleeding. She is anxious.  ? ?Vital Signs: ?BP (!) 158/110   Pulse (!) 107   Temp 98.5 ?F (36.9 ?C) (Oral)   Resp 18   SpO2 98%  ? ?Physical Exam awake, alert.  Chest clear to auscultation bilaterally.  Heart with slightly tachycardic but regular rhythm.  Abdomen soft, obese, positive bowel sounds, some mild right upper quadrant tenderness to palpation.  No lower extremity edema. ? ?Imaging: ?No results  found. ? ?Labs: ? ?CBC: ?Recent Labs  ?  07/30/20 ?1749 06/23/21 ?1616 07/07/21 ?1202  ?WBC 11.3* 8.7 7.3  ?HGB 14.1 14.1 14.3  ?HCT 43.4 42.0 39.8  ?PLT 295 334.0 343  ? ? ?COAGS: ?Recent Labs  ?  06/23/21 ?1616  ?INR 1.0  ? ? ?BMP: ?Recent Lab

## 2021-07-07 NOTE — Progress Notes (Signed)
Corrected time 1645 ?

## 2021-07-08 LAB — SURGICAL PATHOLOGY

## 2021-07-11 ENCOUNTER — Encounter: Payer: Self-pay | Admitting: Gastroenterology

## 2021-07-13 ENCOUNTER — Other Ambulatory Visit: Payer: Self-pay | Admitting: Gastroenterology

## 2021-07-16 ENCOUNTER — Other Ambulatory Visit: Payer: Self-pay | Admitting: Allergy

## 2021-07-25 ENCOUNTER — Other Ambulatory Visit: Payer: Self-pay | Admitting: Allergy

## 2021-08-05 ENCOUNTER — Other Ambulatory Visit: Payer: Self-pay | Admitting: Gastroenterology

## 2021-08-06 ENCOUNTER — Other Ambulatory Visit: Payer: Self-pay | Admitting: Gastroenterology

## 2021-08-08 ENCOUNTER — Other Ambulatory Visit: Payer: Self-pay | Admitting: Gastroenterology

## 2021-08-08 ENCOUNTER — Telehealth: Payer: Self-pay | Admitting: Gastroenterology

## 2021-08-08 ENCOUNTER — Other Ambulatory Visit: Payer: Self-pay

## 2021-08-08 MED ORDER — PROMETHAZINE HCL 25 MG PO TABS
25.0000 mg | ORAL_TABLET | Freq: Four times a day (QID) | ORAL | 0 refills | Status: DC | PRN
Start: 2021-08-08 — End: 2021-08-12

## 2021-08-08 NOTE — Telephone Encounter (Signed)
1 refill will be sent, but patient has to keep her following up appt this week, otherwise she will not get any further refills.

## 2021-08-08 NOTE — Progress Notes (Signed)
20 tablets sent to pharmacy.Pt needs to keep follow up appt this week with Dr. Rush Landmark.

## 2021-08-08 NOTE — Telephone Encounter (Signed)
Inbound call from patient stating she needs a refill for Phenergan. Please advise.  ?

## 2021-08-12 ENCOUNTER — Ambulatory Visit (INDEPENDENT_AMBULATORY_CARE_PROVIDER_SITE_OTHER): Payer: Medicaid Other | Admitting: Gastroenterology

## 2021-08-12 ENCOUNTER — Encounter: Payer: Self-pay | Admitting: Gastroenterology

## 2021-08-12 ENCOUNTER — Other Ambulatory Visit (INDEPENDENT_AMBULATORY_CARE_PROVIDER_SITE_OTHER): Payer: Medicaid Other

## 2021-08-12 VITALS — BP 110/60 | HR 68 | Ht 67.0 in | Wt 267.0 lb

## 2021-08-12 DIAGNOSIS — K769 Liver disease, unspecified: Secondary | ICD-10-CM

## 2021-08-12 DIAGNOSIS — R112 Nausea with vomiting, unspecified: Secondary | ICD-10-CM

## 2021-08-12 DIAGNOSIS — Z23 Encounter for immunization: Secondary | ICD-10-CM | POA: Diagnosis not present

## 2021-08-12 DIAGNOSIS — K7581 Nonalcoholic steatohepatitis (NASH): Secondary | ICD-10-CM

## 2021-08-12 LAB — CBC
HCT: 40.8 % (ref 36.0–46.0)
Hemoglobin: 13.7 g/dL (ref 12.0–15.0)
MCHC: 33.6 g/dL (ref 30.0–36.0)
MCV: 90.7 fl (ref 78.0–100.0)
Platelets: 379 10*3/uL (ref 150.0–400.0)
RBC: 4.51 Mil/uL (ref 3.87–5.11)
RDW: 13.3 % (ref 11.5–15.5)
WBC: 11 10*3/uL — ABNORMAL HIGH (ref 4.0–10.5)

## 2021-08-12 LAB — COMPREHENSIVE METABOLIC PANEL
ALT: 37 U/L — ABNORMAL HIGH (ref 0–35)
AST: 38 U/L — ABNORMAL HIGH (ref 0–37)
Albumin: 4.3 g/dL (ref 3.5–5.2)
Alkaline Phosphatase: 111 U/L (ref 39–117)
BUN: 11 mg/dL (ref 6–23)
CO2: 29 mEq/L (ref 19–32)
Calcium: 9.6 mg/dL (ref 8.4–10.5)
Chloride: 99 mEq/L (ref 96–112)
Creatinine, Ser: 1.8 mg/dL — ABNORMAL HIGH (ref 0.40–1.20)
GFR: 34.41 mL/min — ABNORMAL LOW (ref >60.00)
Glucose, Bld: 138 mg/dL — ABNORMAL HIGH (ref 70–99)
Potassium: 3.6 mEq/L (ref 3.5–5.1)
Sodium: 138 mEq/L (ref 135–145)
Total Bilirubin: 0.6 mg/dL (ref 0.2–1.2)
Total Protein: 8 g/dL (ref 6.0–8.3)

## 2021-08-12 LAB — PROTIME-INR
INR: 1.1 ratio — ABNORMAL HIGH (ref 0.8–1.0)
Prothrombin Time: 11.6 s (ref 9.6–13.1)

## 2021-08-12 MED ORDER — PROMETHAZINE HCL 25 MG PO TABS: 25.0000 mg | ORAL_TABLET | Freq: Four times a day (QID) | ORAL | 6 refills | Status: DC | PRN

## 2021-08-12 MED ORDER — DRONABINOL 2.5 MG PO CAPS: 2.5000 mg | ORAL_CAPSULE | Freq: Two times a day (BID) | ORAL | 1 refills | Status: DC

## 2021-08-12 NOTE — Progress Notes (Unsigned)
[place] VISIT   Primary Care Provider Karleen Hampshire., MD [address] [place] [address] [phone]  Patient Profile: Angie Brown is a 41 y.o. 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  The patient presents to the [facility] for an evaluation and management of problem(s) noted below:  Problem List 1. NASH (nonalcoholic steatohepatitis)   2. Chronic liver disease   3. Nausea and vomiting, unspecified vomiting type     History of Present Illness Please see prior notes for full details of HPI.  Interval History The patient returns for follow-up and she is accompanied by her family member again.  She underwent liver biopsy via ultrasound guidance with results as below.  However she has continued to experience issues of nausea as has been a chronic issue for her.  Scopolamine patches were utilized but not clear that they made a difference for her and more recently had a little bit of dry skin that occurred at most recent site and so she is not sure if she wants to continue taking that if its not been helpful.  She was able to read through and do some research online in regards to the findings of her liver biopsy and she has become more concerned about this.  She experienced issues while she was trying to decrease some of her mental health medications and went through a partial withdrawal that caused her to have more issues.  She is continuing to gain weight.  The patient denies any issues with jaundice, scleral icterus, generalized pruritus, darkened/amber urine, clay-colored stools, LE edema, hematemesis, coffee-ground emesis, confusion.   GI Review of  Systems Positive as above including episodes of bloating and distention, and anorexia Negative for dysphagia, odynophagia  Review of Systems General: Denies fevers/chills/unintentional weight loss Cardiovascular: Denies chest pain Pulmonary: Denies shortness of breath Gastroenterological: See HPI Genitourinary: Denies darkened urine Hematological: Denies easy bruising/bleeding Dermatological: Denies jaundice Psychological: Mood remains low and depressed/down   Medications Current Outpatient Medications  Medication Sig Dispense Refill   albuterol (PROVENTIL) (2.5 MG/3ML) 0.083% nebulizer solution Take 2.5 mg by nebulization every 6 (six) hours as needed for wheezing or shortness of breath.      albuterol (VENTOLIN HFA) 108 (90 Base) MCG/ACT inhaler Inhale 1-2 puffs into the lungs every 6 (six) hours as needed for wheezing or shortness of breath. 18 g 0   clonazePAM (KLONOPIN) 1 MG tablet Take 1 mg by mouth 2 (two) times daily.     Dexlansoprazole 30 MG capsule DR TAKE 1 CAPSULE (30 MG TOTAL) BY MOUTH DAILY. 30 capsule 3   dicyclomine (BENTYL) 10 MG capsule Take 1 capsule (10 mg total) by mouth 4 (four) times daily -  before meals and at bedtime. 90 capsule 3   diphenhydrAMINE (BENADRYL) 25 mg capsule Take 25 mg by mouth at bedtime as needed for allergies.      dronabinol (MARINOL) 2.5 MG capsule Take 1 capsule (2.5 mg total) by mouth 2 (two) times daily before a meal. 60 capsule 1   DULoxetine (CYMBALTA) 60 MG capsule Take 60 mg by mouth daily.     EPINEPHrine 0.3 mg/0.3 mL IJ SOAJ injection Inject 0.3 mg into the muscle as needed for anaphylaxis. 1 each 2   fluticasone (FLONASE) 50 MCG/ACT nasal spray Place  1 spray into both nostrils 2 (two) times daily as needed (nasal congestion). 16 g 5   folic acid (FOLVITE) 1 MG tablet Take 1 mg by mouth daily.     hydrOXYzine (VISTARIL) 25 MG capsule Take 25 mg by mouth 3 (three) times daily as needed for anxiety.     levocetirizine (XYZAL) 5 MG  tablet TAKE 1 TABLET BY MOUTH EVERY EVENING. 30 tablet 5   levothyroxine (SYNTHROID) 50 MCG tablet Take 50 mcg by mouth daily before breakfast.     linaclotide (LINZESS) 290 MCG CAPS capsule Take 1 capsule (290 mcg total) by mouth daily before breakfast. 90 capsule 1   Melatonin 10 MG CAPS Take 1 capsule by mouth as needed.     montelukast (SINGULAIR) 10 MG tablet Take 1 tablet (10 mg total) by mouth at bedtime. 30 tablet 5   rizatriptan (MAXALT-MLT) 10 MG disintegrating tablet DISSOLVE 1 TABLET ON THE TONGUE AS NEEDED FOR MIGRAINE. MAY REPEAT IN 2 HOURS IF NEEDED.     sucralfate (CARAFATE) 1 GM/10ML suspension Take 10 mLs (1 g total) by mouth 2 (two) times daily. 420 mL 1   Vitamin D, Ergocalciferol, (DRISDOL) 1.25 MG (50000 UNIT) CAPS capsule Take 50,000 Units by mouth once a week.     promethazine (PHENERGAN) 25 MG tablet Take 1 tablet (25 mg total) by mouth every 6 (six) hours as needed. 30 tablet 6   No current facility-administered medications for this visit.    Allergies Allergies  Allergen Reactions   Loratadine Shortness Of Breath, Swelling and Palpitations    hypoventilation    Wasp Venom Anaphylaxis   Latex Itching   Sumatriptan Other (See Comments)    sinus pain, sinus on fire and face hurts   Tape Other (See Comments)    unknown   Doxycycline Nausea And Vomiting   Ibuprofen Nausea And Vomiting    GI upset   Methocarbamol Rash    Histories Past Medical History:  Diagnosis Date   Allergy    Wasps   Asthma    Bronchitis    Depression    Dysrhythmia    PVCs- stress related   Eczema    Family history of adverse reaction to anesthesia    Hypoglycemia    Hypothyroidism    PONV (postoperative nausea and vomiting)    pt prefers scop patch   Renal disorder    2008   Past Surgical History:  Procedure Laterality Date   ABDOMINAL HYSTERECTOMY     ADENOIDECTOMY     BACK SURGERY  07/2009   spinal fusion L 5-S1   BIOPSY  12/07/2018   Procedure: BIOPSY;  Surgeon:  [name], MD;  Location: [facility];  Service: Gastroenterology;;   BIOPSY  10/25/2020   Procedure: BIOPSY;  Surgeon: [name], MD;  Location: [facility];  Service: Gastroenterology;;   BLADDER SUSPENSION N/A 03/11/2014   Procedure: TRANSVAGINAL TAPE (TVT) PROCEDURE;  Surgeon: Cheri Fowler, MD;  Location: [facility];  Service: Gynecology;  Laterality: N/A;   CHOLECYSTECTOMY     ESOPHAGOGASTRODUODENOSCOPY (EGD) WITH PROPOFOL N/A 12/07/2018   Procedure: ESOPHAGOGASTRODUODENOSCOPY (EGD) WITH PROPOFOL;  Surgeon: Rush Landmark Telford Nab., MD;  Location: [facility];  Service: Gastroenterology;  Laterality: N/A;   ESOPHAGOGASTRODUODENOSCOPY (EGD) WITH PROPOFOL N/A 10/25/2020   Procedure: ESOPHAGOGASTRODUODENOSCOPY (EGD) WITH PROPOFOL;  Surgeon: Rush Landmark Telford Nab., MD;  Location: [facility];  Service: Gastroenterology;  Laterality: N/A;   TONSILLECTOMY AND ADENOIDECTOMY     TYMPANOSTOMY TUBE PLACEMENT     UPPER  ESOPHAGEAL ENDOSCOPIC ULTRASOUND (EUS) N/A 10/25/2020   Procedure: UPPER ESOPHAGEAL ENDOSCOPIC ULTRASOUND (EUS);  Surgeon: [name], MD;  Location: [facility];  Service: Gastroenterology;  Laterality: N/A;   Social History   Socioeconomic History   Marital status: Divorced    Spouse name: Not on file   Number of children: 3   Years of education: Not on file   Highest education level: Not on file  Occupational History   Occupation: housewife  Tobacco Use   Smoking status: Former    Packs/day: 0.50    Types: Cigarettes   Smokeless tobacco: Never  Vaping Use   Vaping Use: Former   Substances: Nicotine, Flavoring  Substance and Sexual Activity   Alcohol use: Not Currently   Drug use: No   Sexual activity: Yes    Partners: Male    Birth control/protection: Surgical  Other Topics Concern   Not on file  Social History Narrative   Not on file   Social Determinants of Health   Financial Resource Strain: Not on file  Food  Insecurity: Not on file  Transportation Needs: Not on file  Physical Activity: Not on file  Stress: Not on file  Social Connections: Not on file  Intimate Partner Violence: Not on file   Family History  Problem Relation Age of Onset   Asthma Mother    Allergic rhinitis Mother    Liver disease Mother        Fatty liver, NASH   GI Bleed Mother    Hypothyroidism Mother    Allergic rhinitis Father    Hypothyroidism Sister    Liver disease Sister        fatty liver   Asthma Sister    Allergic rhinitis Sister    Hypothyroidism Sister    Liver disease Sister        fatty liver   Thyroid cancer Sister    Liver cancer Maternal Grandfather    Liver disease Maternal Grandfather        fatty liver   Colon cancer Neg Hx    Esophageal cancer Neg Hx    Rectal cancer Neg Hx    Inflammatory bowel disease Neg Hx    Pancreatic cancer Neg Hx    Stomach cancer Neg Hx    I have reviewed her medical, social, and family history in detail and updated the electronic medical record as necessary.    PHYSICAL EXAMINATION  BP 110/60   Pulse 68   Ht $R'5\' 7"'dC$  (1.702 m)   Wt 267 lb (121.1 kg)   BMI 41.82 kg/m  Wt Readings from Last 3 Encounters:  08/12/21 267 lb (121.1 kg)  06/28/21 265 lb (120.2 kg)  02/16/21 245 lb (111.1 kg)  GEN: NAD, appears stated age, doesn't appear chronically ill but looks fatigued, accompanied by family member PSYCH: Cooperative, without pressured speech EYE: Conjunctivae pink, sclerae anicteric ENT: MMM CV: Nontachycardic RESP: No audible wheezing GI: NABS, soft, rounded, obese, no rebound MSK/EXT: Bilateral pedal edema present SKIN: No jaundice, no spider angiomata NEURO:  Alert & Oriented x 3, no focal deficits   REVIEW OF DATA  I reviewed the following data at the time of this encounter:  GI Procedures and Studies  Liver biopsy FINAL MICROSCOPIC DIAGNOSIS:  A. LIVER, LEFT LOBE, BIOPSY:  - Moderately active steatohepatitis (grade 2 of 3)  - Bridging  fibrous septa (stage 3 of 4)  - See comment  COMMENT:  The biopsy is adequate for review and consists of several  cores of liver with a distorted architecture.  Hepatic nodules show severe macrovesicular steatosis (about 75%) with moderate ballooning degeneration with a few poorly formed Mallory hyalines. Foci of lobular inflammation with some showing neutrophilic satellitosis are present.  Portal tracts and fibrous septa show minimal inflammation with preserved biliary and vascular structures.  Trichrome and reticulin stains highlight the presence of multiple bridging fibrous septa.  Focal nodule formation cannot be ruled out.  Iron stain shows no abnormal iron accumulation. PASD stain shows no globular inclusions within hepatocytes. The findings are that of moderately active steatohepatitis, alcoholic and/or non-alcoholic, with multiple bridging fibrous septa.   Laboratory Studies  Reviewed those in epic  Imaging Studies  October 2022 MRI/MRCP IMPRESSION: 1. Diffuse hepatic steatosis. 2. Normal appearance of the adrenal glands and pancreas.   ASSESSMENT  Ms. Milanese is a 41 y.o. 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  The patient is seen today for evaluation and management of:  1. NASH (nonalcoholic steatohepatitis)   2. Chronic liver disease   3. Nausea and vomiting, unspecified vomiting type    The patient is hemodynamically stable.  Liver biopsy confirms metabolic associated fatty liver disease and is also showing evidence of relatively high fibrosis (stage 3 out of 4).  Thus, we now can see that she has active chronic liver disease and we need to monitor her very closely.  Her most recent hemoglobin A1c per her report  is 6.4 thus I cannot use vitamin D since she is nearly diabetic.  Weight loss is going to be the most important thing for her and I think if she can lose 10% of her weight she will have a dramatic improvement in the active hepatitis and potentially have some regeneration.  This is harder for me to ask her in the setting of her chronic issues of depressed mood because I think this causes her inability to function to the level that she would want.  As well, her chronic nausea vomiting continue to cause her issues.  I am going to stop the scopolamine patches.  We will trial Marinol in an effort of seeing if that can be effective for her but otherwise no longer have any other medications that I can help her with, in regards to trying to help her chronic nausea and vomiting.  She may need a quaternary evaluation GIST for that evaluation.  However in the setting of her progressive steatohepatitis and fibrosis, she needs a hepatologist.  We are going to refer her to [facility] so that she may be able to meet her hepatology team because of her young age.  Not sure if she will be a candidate for any clinical trials but trying to help her as much as we can so that she does not progress further into cirrhosis.  She is going to try her best to work on weight loss.  Unfortunately as a result of her financial issues she cannot really get into the healthy weight to help her.  She will continue to work with her PCP.  All patient questions were answered to the best of my ability, and the patient agrees to the aforementioned plan of action with follow-up as indicated.   PLAN  Referral to [facility] hepatology in setting of chronic liver disease, metabolic associated fatty liver disease, advancing fibrosis If patient  able healthy weight/wellness could be of benefit to patient Try to lose 10% of weight over the course of next 3 to 4 months may have significant impact for her Appreciate behavioral health continuing to work with patient  in regards to her mood Query possible role of TCA in future or BuSpar that may be effective from a GI standpoint Initiation of Marinol 2.5 mg twice daily for nausea Stop scopolamine Continue Linzess Continue Phenergan as needed  Volume -Obtain daily standing weight -2000 mg Na diet Infection -No evidence of ascites Bleeding -Varices Encephalopathy -None Screening -We will update by getting ultrasound Transplant -Not necessary currently Vaccination -Begin HAV/HBV immunization -PCP should consider Influenza, Pneumococcal   Orders Placed This Encounter  Procedures   US Abdomen Limited RUQ (LIVER/GB)   Heplisav-B (HepB-CPG) Vaccine   Hepatitis A vaccine adult IM   CBC   Comp Met (CMET)   INR/PT   Amb Referral to Hepatology    New Prescriptions   DRONABINOL (MARINOL) 2.5 MG CAPSULE    Take 1 capsule (2.5 mg total) by mouth 2 (two) times daily before a meal.   Modified Medications   Modified Medication Previous Medication   PROMETHAZINE (PHENERGAN) 25 MG TABLET promethazine (PHENERGAN) 25 MG tablet      Take 1 tablet (25 mg total) by mouth every 6 (six) hours as needed.    Take 1 tablet (25 mg total) by mouth every 6 (six) hours as needed.    Planned Follow Up No follow-ups on file.   Total Time in Face-to-Face and in Coordination of Care for patient including independent/personal interpretation/review of prior testing, medical history, examination, medication adjustment, communicating results with the patient directly, and documentation with the EHR is 30 minutes.   Justice Britain, MD [facility] Office # [id number]

## 2021-08-12 NOTE — Patient Instructions (Addendum)
You have been scheduled for an abdominal ultrasound at [facility] (1st floor of hospital) on 08/22/21 at 8:00am. Please arrive 15 minutes prior to your appointment for registration. Make certain not to have anything to eat or drink 6 hours prior to your appointment. Should you need to reschedule your appointment, please contact radiology at [phone]. This test typically takes about 30 minutes to perform.   Your provider has requested that you go to the basement level for lab work before leaving today. Press "B" on the elevator. The lab is located at the first door on the left as you exit the elevator.  You have been referred to to [place] NP- Her office will contact you to schedule an appointment. If you have not heard from her office in 1-2 weeks, please contact them at [address], [place], [place] [address] Phone: [phone]  Stop Scopolamine patches.   Start Marinol 2.5mg  -Take 1 tablet twice daily for 1 week, then decrease down to once daily dosing.   Start Vitamin B12 2000mg  -take once daily. ( Over the counter)   We have sent the following medications to your pharmacy for you to pick up at your convenience:Promethazine, Marinol   Your next Hep B vaccine will be on : 09/12/24 at 3:00pm   Thank you for choosing me and [facility].  Dr. Rush Landmark

## 2021-08-14 ENCOUNTER — Encounter: Payer: Self-pay | Admitting: Gastroenterology

## 2021-08-14 DIAGNOSIS — K769 Liver disease, unspecified: Secondary | ICD-10-CM | POA: Insufficient documentation

## 2021-08-16 ENCOUNTER — Other Ambulatory Visit (HOSPITAL_COMMUNITY): Payer: Self-pay

## 2021-08-16 ENCOUNTER — Telehealth: Payer: Self-pay | Admitting: Pharmacy Technician

## 2021-08-16 NOTE — Telephone Encounter (Signed)
Received notification from [facility] regarding a prior authorization for DRONABINOL 2.5MG . Authorization has been APPROVED from 5.30.23 to UNTIL FURTHER NOTICE.   Per test claim, copay for 90 days supply is $0  Authorization # PA Case ID: [id number]

## 2021-08-16 NOTE — Telephone Encounter (Signed)
Patient Advocate Encounter  Received notification from [employer] that prior authorization for DRONABINOL 2.5MG  is required.   PA submitted on 5.30.23 Key [id number] Status is pending   [facility] will continue to follow  Ricke Hey, CPhT Patient Advocate Phone: [phone]

## 2021-08-22 ENCOUNTER — Ambulatory Visit (HOSPITAL_COMMUNITY)
Admission: RE | Admit: 2021-08-22 | Discharge: 2021-08-22 | Disposition: A | Discharge status: Home / Self Care | Payer: Medicaid Other | Source: Ambulatory Visit | Attending: Gastroenterology | Admitting: Gastroenterology

## 2021-08-22 DIAGNOSIS — K7581 Nonalcoholic steatohepatitis (NASH): Secondary | ICD-10-CM

## 2021-08-22 DIAGNOSIS — R112 Nausea with vomiting, unspecified: Secondary | ICD-10-CM | POA: Diagnosis present

## 2021-08-22 DIAGNOSIS — K769 Liver disease, unspecified: Secondary | ICD-10-CM

## 2021-08-24 ENCOUNTER — Telehealth: Payer: Self-pay

## 2021-08-24 ENCOUNTER — Other Ambulatory Visit: Payer: Self-pay

## 2021-08-24 DIAGNOSIS — K7581 Nonalcoholic steatohepatitis (NASH): Secondary | ICD-10-CM

## 2021-08-24 DIAGNOSIS — K769 Liver disease, unspecified: Secondary | ICD-10-CM

## 2021-08-24 NOTE — Telephone Encounter (Signed)
Patient has been notified of [place] results. She did want Dr. Meridee Score to be aware that she has lost 10lbs since her last office visit & that the marinol is working well.

## 2021-08-25 NOTE — Telephone Encounter (Signed)
Thank you for the update.  I am glad to hear that he is helping and she is losing weight, and hopefully it will be long-lasting.  Thanks. GM

## 2021-09-01 ENCOUNTER — Other Ambulatory Visit: Payer: Self-pay | Admitting: Gastroenterology

## 2021-09-19 ENCOUNTER — Ambulatory Visit (INDEPENDENT_AMBULATORY_CARE_PROVIDER_SITE_OTHER): Payer: Medicaid Other | Admitting: Gastroenterology

## 2021-09-19 DIAGNOSIS — Z23 Encounter for immunization: Secondary | ICD-10-CM | POA: Diagnosis not present

## 2021-09-21 DIAGNOSIS — K7402 Hepatic fibrosis, advanced fibrosis: Secondary | ICD-10-CM | POA: Insufficient documentation

## 2021-10-04 DIAGNOSIS — E1165 Type 2 diabetes mellitus with hyperglycemia: Secondary | ICD-10-CM | POA: Insufficient documentation

## 2021-10-26 ENCOUNTER — Encounter (INDEPENDENT_AMBULATORY_CARE_PROVIDER_SITE_OTHER): Payer: Self-pay

## 2021-11-01 ENCOUNTER — Other Ambulatory Visit: Payer: Self-pay | Admitting: Gastroenterology

## 2021-11-24 ENCOUNTER — Other Ambulatory Visit: Payer: Self-pay | Admitting: Gastroenterology

## 2022-01-10 ENCOUNTER — Telehealth: Payer: Self-pay | Admitting: Gastroenterology

## 2022-01-10 NOTE — Telephone Encounter (Signed)
Patient called requesting to speak with a nurse regarding her next injection not sure if she needs to schedule one before or after her [place] appt.

## 2022-01-10 NOTE — Telephone Encounter (Signed)
The pt has been scheduled for a follow up of her constipation.  She called to report that she has an [place] in December and would like to have an appt made in case she needs it.  When asked about what injection she was speaking of she said "that was not my question"  She was scheduled and had nothing further

## 2022-01-10 NOTE — Telephone Encounter (Signed)
Left message on machine to call back  

## 2022-01-10 NOTE — Telephone Encounter (Signed)
Patient returning your call.

## 2022-01-25 ENCOUNTER — Other Ambulatory Visit: Payer: Self-pay | Admitting: Gastroenterology

## 2022-01-25 ENCOUNTER — Other Ambulatory Visit: Payer: Self-pay | Admitting: Allergy

## 2022-02-23 ENCOUNTER — Ambulatory Visit (HOSPITAL_COMMUNITY)
Admission: RE | Admit: 2022-02-23 | Discharge: 2022-02-23 | Disposition: A | Discharge status: Home / Self Care | Payer: Medicaid Other | Source: Ambulatory Visit | Attending: Gastroenterology | Admitting: Gastroenterology

## 2022-02-23 DIAGNOSIS — K769 Liver disease, unspecified: Secondary | ICD-10-CM | POA: Insufficient documentation

## 2022-02-23 DIAGNOSIS — K7581 Nonalcoholic steatohepatitis (NASH): Secondary | ICD-10-CM | POA: Insufficient documentation

## 2022-02-24 ENCOUNTER — Other Ambulatory Visit: Payer: Self-pay | Admitting: Gastroenterology

## 2022-03-08 ENCOUNTER — Other Ambulatory Visit (INDEPENDENT_AMBULATORY_CARE_PROVIDER_SITE_OTHER): Payer: Medicaid Other

## 2022-03-08 ENCOUNTER — Encounter: Payer: Self-pay | Admitting: Gastroenterology

## 2022-03-08 ENCOUNTER — Ambulatory Visit (INDEPENDENT_AMBULATORY_CARE_PROVIDER_SITE_OTHER): Payer: Medicaid Other | Admitting: Gastroenterology

## 2022-03-08 VITALS — BP 140/90 | HR 126 | Ht 67.0 in | Wt 226.1 lb

## 2022-03-08 DIAGNOSIS — K5900 Constipation, unspecified: Secondary | ICD-10-CM

## 2022-03-08 DIAGNOSIS — R110 Nausea: Secondary | ICD-10-CM

## 2022-03-08 DIAGNOSIS — Z23 Encounter for immunization: Secondary | ICD-10-CM

## 2022-03-08 DIAGNOSIS — K602 Anal fissure, unspecified: Secondary | ICD-10-CM

## 2022-03-08 DIAGNOSIS — K5909 Other constipation: Secondary | ICD-10-CM

## 2022-03-08 DIAGNOSIS — R1110 Vomiting, unspecified: Secondary | ICD-10-CM

## 2022-03-08 DIAGNOSIS — R109 Unspecified abdominal pain: Secondary | ICD-10-CM

## 2022-03-08 DIAGNOSIS — K760 Fatty (change of) liver, not elsewhere classified: Secondary | ICD-10-CM | POA: Diagnosis not present

## 2022-03-08 LAB — CBC
HCT: 40.6 % (ref 36.0–46.0)
Hemoglobin: 13.8 g/dL (ref 12.0–15.0)
MCHC: 33.9 g/dL (ref 30.0–36.0)
MCV: 88.2 fl (ref 78.0–100.0)
Platelets: 448 10*3/uL — ABNORMAL HIGH (ref 150.0–400.0)
RBC: 4.6 Mil/uL (ref 3.87–5.11)
RDW: 13.1 % (ref 11.5–15.5)
WBC: 12 10*3/uL — ABNORMAL HIGH (ref 4.0–10.5)

## 2022-03-08 LAB — HEPATIC FUNCTION PANEL
ALT: 17 U/L (ref 0–35)
AST: 25 U/L (ref 0–37)
Albumin: 2.9 g/dL — ABNORMAL LOW (ref 3.5–5.2)
Alkaline Phosphatase: 79 U/L (ref 39–117)
Bilirubin, Direct: 0.2 mg/dL (ref 0.0–0.3)
Total Bilirubin: 0.7 mg/dL (ref 0.2–1.2)
Total Protein: 8.4 g/dL — ABNORMAL HIGH (ref 6.0–8.3)

## 2022-03-08 LAB — PROTIME-INR
INR: 1.1 ratio — ABNORMAL HIGH (ref 0.8–1.0)
Prothrombin Time: 12.3 s (ref 9.6–13.1)

## 2022-03-08 MED ORDER — LINACLOTIDE 290 MCG PO CAPS: ORAL_CAPSULE | ORAL | 11 refills | Status: DC

## 2022-03-08 MED ORDER — AMBULATORY NON FORMULARY MEDICATION: 0 refills | Status: AC

## 2022-03-08 MED ORDER — DRONABINOL 2.5 MG PO CAPS: 2.5000 mg | ORAL_CAPSULE | Freq: Three times a day (TID) | ORAL | 0 refills | Status: DC

## 2022-03-08 MED ORDER — DICYCLOMINE HCL 10 MG PO CAPS: ORAL_CAPSULE | ORAL | 1 refills | Status: DC

## 2022-03-08 NOTE — Progress Notes (Signed)
[facility] VISIT   Primary Care Provider Karleen Hampshire., MD [address] [place] [address] [phone]  Patient Profile: Angie Brown is a 41 y.o. 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  The patient presents to the [facility] for an evaluation and management of problem(s) noted below:  Problem List 1. Functional nausea   2. Chronic vomiting   3. Nonalcoholic fatty liver disease   4. Abdominal cramping   5. Chronic constipation   6. Anal fissure     History of Present Illness Please see prior notes for full details of HPI.  Interval History Patient returns for follow up.  Patient has been on Ozempic.  She has lost nearly 40 pounds.  She still is experiencing the same Nausea/Vomiting issues, even before Ozempic was initiated and does not believe that it has effected this more so.  She was upset personally due to the recent Liver U/S still showing steatosis.  The patient denies any issues with jaundice, scleral icterus, generalized pruritus, darkened/amber urine, clay-colored stools, LE edema, hematemesis, coffee-ground emesis, abdominal distention, confusion.  If she strains she will have some BRB on the toilet paper  GI Review of Systems Positive as above including bloating at times Negative for dysphagia, odynophagia, melena  Review of Systems General: Denies fevers/chills/unintentional weight loss Cardiovascular: Denies chest pain Pulmonary: Denies shortness of breath Gastroenterological: See HPI Genitourinary: Denies darkened urine Hematological: Denies easy bruising/bleeding Dermatological: Denies jaundice Psychological: Mood is  stable   Medications Current Outpatient Medications  Medication Sig Dispense Refill   albuterol (PROVENTIL) (2.5 MG/3ML) 0.083% nebulizer solution Take 2.5 mg by nebulization every 6 (six) hours as needed for wheezing or shortness of breath.      albuterol (VENTOLIN HFA) 108 (90 Base) MCG/ACT inhaler Inhale 1-2 puffs into the lungs every 6 (six) hours as needed for wheezing or shortness of breath. 18 g 0   AMBULATORY NON FORMULARY MEDICATION Medication Name: Diltiazem 2%/ Lidocaine 5% Apply pea size amount 1/2 to 1 inch inside rectum 3-4 times daily 30 g 0   Dexlansoprazole 30 MG capsule DR TAKE 1 CAPSULE (30 MG TOTAL) BY MOUTH DAILY. 30 capsule 3   diphenhydrAMINE (BENADRYL) 25 mg capsule Take 25 mg by mouth at bedtime as needed for allergies.      EPINEPHrine 0.3 mg/0.3 mL IJ SOAJ injection Inject 0.3 mg into the muscle as needed for anaphylaxis. 1 each 2   fluticasone (FLONASE) 50 MCG/ACT nasal spray Place 1 spray into both nostrils 2 (two) times daily as needed (nasal congestion). 16 g 5   folic acid (FOLVITE) 1 MG tablet Take 1 mg by mouth daily.     hydrOXYzine (VISTARIL) 25 MG capsule Take 25 mg by mouth 3 (three) times daily as needed for anxiety.     levocetirizine (XYZAL) 5 MG tablet TAKE 1 TABLET BY MOUTH EVERY EVENING. 30 tablet 5   levothyroxine (SYNTHROID) 50 MCG tablet Take 50 mcg by mouth daily before breakfast.     Melatonin 10 MG CAPS Take 1 capsule by mouth as needed.     montelukast (SINGULAIR) 10 MG tablet Take 1 tablet (10 mg total) by mouth at bedtime. 30 tablet 5   OZEMPIC, 1 MG/DOSE, 4 MG/3ML SOPN Inject 1 mg into the skin once a week.  promethazine (PHENERGAN) 25 MG tablet TAKE 1 TABLET BY MOUTH EVERY 6 HOURS AS NEEDED. 30 tablet 6   rizatriptan (MAXALT-MLT) 10 MG disintegrating tablet DISSOLVE 1 TABLET ON THE TONGUE AS NEEDED FOR MIGRAINE. MAY REPEAT IN 2 HOURS IF NEEDED.     Vitamin D, Ergocalciferol, (DRISDOL) 1.25 MG (50000 UNIT) CAPS capsule Take 50,000 Units  by mouth once a week.     dicyclomine (BENTYL) 10 MG capsule TAKE 1 CAPSULE BY MOUTH 4 TIMES DAILY, BEFORE MEALS AND AT BEDTIME. 90 capsule 1   dronabinol (MARINOL) 2.5 MG capsule Take 1 capsule (2.5 mg total) by mouth 3 (three) times daily. 90 capsule 0   linaclotide (LINZESS) 290 MCG CAPS capsule TAKE 1 CAPSULE (290 MCG TOTAL) BY MOUTH DAILY BEFORE BREAKFAST. 30 capsule 11   sucralfate (CARAFATE) 1 GM/10ML suspension Take 10 mLs (1 g total) by mouth 2 (two) times daily. 420 mL 1   No current facility-administered medications for this visit.    Allergies Allergies  Allergen Reactions   Loratadine Shortness Of Breath, Swelling and Palpitations    hypoventilation    Wasp Venom Anaphylaxis   Latex Itching   Sumatriptan Other (See Comments)    sinus pain, sinus on fire and face hurts   Hepatitis A Vaccine     Pt stated it left a large ring around her arm for 2-3 days.    Tape Other (See Comments)    unknown   Doxycycline Nausea And Vomiting   Ibuprofen Nausea And Vomiting    GI upset   Methocarbamol Rash    Histories Past Medical History:  Diagnosis Date   Allergy    Wasps   Asthma    Bronchitis    Depression    Dysrhythmia    PVCs- stress related   Eczema    Family history of adverse reaction to anesthesia    Hypoglycemia    Hypothyroidism    PONV (postoperative nausea and vomiting)    pt prefers scop patch   Renal disorder    2008   Past Surgical History:  Procedure Laterality Date   ABDOMINAL HYSTERECTOMY     ADENOIDECTOMY     BACK SURGERY  07/2009   spinal fusion L 5-S1   BIOPSY  12/07/2018   Procedure: BIOPSY;  Surgeon: [name], MD;  Location: [facility];  Service: Gastroenterology;;   BIOPSY  10/25/2020   Procedure: BIOPSY;  Surgeon: [name], MD;  Location: [facility];  Service: Gastroenterology;;   BLADDER SUSPENSION N/A 03/11/2014   Procedure: TRANSVAGINAL TAPE (TVT) PROCEDURE;  Surgeon: Cheri Fowler, MD;  Location:  [facility];  Service: Gynecology;  Laterality: N/A;   CHOLECYSTECTOMY     ESOPHAGOGASTRODUODENOSCOPY (EGD) WITH PROPOFOL N/A 12/07/2018   Procedure: ESOPHAGOGASTRODUODENOSCOPY (EGD) WITH PROPOFOL;  Surgeon: Rush Landmark Telford Nab., MD;  Location: [facility];  Service: Gastroenterology;  Laterality: N/A;   ESOPHAGOGASTRODUODENOSCOPY (EGD) WITH PROPOFOL N/A 10/25/2020   Procedure: ESOPHAGOGASTRODUODENOSCOPY (EGD) WITH PROPOFOL;  Surgeon: Rush Landmark Telford Nab., MD;  Location: [facility];  Service: Gastroenterology;  Laterality: N/A;   TONSILLECTOMY AND ADENOIDECTOMY     TYMPANOSTOMY TUBE PLACEMENT     UPPER ESOPHAGEAL ENDOSCOPIC ULTRASOUND (EUS) N/A 10/25/2020   Procedure: UPPER ESOPHAGEAL ENDOSCOPIC ULTRASOUND (EUS);  Surgeon: [name], MD;  Location: [facility];  Service: Gastroenterology;  Laterality: N/A;   Social History   Socioeconomic History   Marital status: Divorced    Spouse name: Not on file   Number of children: 3   Years of  education: Not on file   Highest education level: Not on file  Occupational History   Occupation: housewife  Tobacco Use   Smoking status: Former    Packs/day: 0.50    Types: Cigarettes    Quit date: 08/2021    Years since quitting: 0.5   Smokeless tobacco: Never  Vaping Use   Vaping Use: Former   Substances: Nicotine, Flavoring  Substance and Sexual Activity   Alcohol use: Not Currently   Drug use: No   Sexual activity: Yes    Partners: Male    Birth control/protection: Surgical  Other Topics Concern   Not on file  Social History Narrative   Not on file   Social Determinants of Health   Financial Resource Strain: Not on file  Food Insecurity: Not on file  Transportation Needs: Not on file  Physical Activity: Not on file  Stress: Not on file  Social Connections: Not on file  Intimate Partner Violence: Not on file   Family History  Problem Relation Age of Onset   Asthma Mother    Allergic rhinitis Mother    Liver  disease Mother        Fatty liver, NASH   GI Bleed Mother    Hypothyroidism Mother    Allergic rhinitis Father    Hypothyroidism Sister    Liver disease Sister        fatty liver   Asthma Sister    Allergic rhinitis Sister    Hypothyroidism Sister    Liver disease Sister        fatty liver   Thyroid cancer Sister    Liver cancer Maternal Grandfather    Liver disease Maternal Grandfather        fatty liver   Colon cancer Neg Hx    Esophageal cancer Neg Hx    Rectal cancer Neg Hx    Inflammatory bowel disease Neg Hx    Pancreatic cancer Neg Hx    Stomach cancer Neg Hx    I have reviewed her medical, social, and family history in detail and updated the electronic medical record as necessary.    PHYSICAL EXAMINATION  BP (!) 140/90 (BP Location: Left Arm, Patient Position: Sitting)   Pulse (!) 126   Ht 5' 7"  (1.702 m)   Wt 226 lb 2 oz (102.6 kg)   SpO2 97%   BMI 35.42 kg/m  Wt Readings from Last 3 Encounters:  03/08/22 226 lb 2 oz (102.6 kg)  08/12/21 267 lb (121.1 kg)  06/28/21 265 lb (120.2 kg)  GEN: NAD, appears stated age, seems more cheerful today, non-toxic, accompanied by family member PSYCH: Cooperative, without pressured speech EYE: Conjunctivae pink, sclerae anicteric ENT: MMM CV: Nontachycardic RESP: No audible wheezing GI: NABS, soft, rounded, obese, no rebound MSK/EXT: Bilateral pedal edema present SKIN: No jaundice, no spider angiomata NEURO:  Alert & Oriented x 3, no focal deficits   REVIEW OF DATA  I reviewed the following data at the time of this encounter:  GI Procedures and Studies  No new studies to review  Laboratory Studies  Reviewed those in epic  Imaging Studies  12/23 Liver U/S IMPRESSION: 1. Prior cholecystectomy. 2. Diffusely increased hepatic parenchymal echogenicity suggestive of steatosis. No focal lesion.   ASSESSMENT  Ms. Reddy is a 41 y.o. 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  The patient is seen today for evaluation and management of:  1. Functional nausea   2. Chronic vomiting   3. Nonalcoholic fatty liver disease   4. Abdominal cramping   5. Chronic constipation   6. Anal fissure    The patient is stable hemodynamically.  I believe that she has functional Nausea/Vomiting, but at this point I would like a second opinion to make sure nothing else is being missed.  She is on Ozempic currently, and although this an exacerbate symptoms, she does not feel it has changed anything, other than helping her lose weight.  I will be OK for her to continue her Ozempic at current dosing.  Will refer to [facility] to see if there is anything else that we could be missing in her exhaustive workup.  [facility] for evaluating patient with high-grade fibrosis.  She is doing the right things with weight loss and this will have impacts to her long-term health.  Will plan to see where things stand in the coming months.  All patient questions were answered to the best of my ability, and the patient agrees to the aforementioned plan of action with follow-up as indicated.   PLAN  Appreciate [facility] monitoring Will plan a 36-monthfollowup Liver U/S and if stable then plan for yearly U/S thereafter pending how her labs look Appreciate behavioral health continuing to work with patient in regards to her mood -Will allow them to consider additional use of medications such as TCA or BuSpar that could be helpful for her GI issues as well Marinol 2-4 times daily as tolerating Continue Phenergan as needed Continue Linzess QD-QOD Second opinion referral to [facility] to see if anything else is being missed in regards to her N/V symptoms (appreciate  any further advices)  Volume -Obtain daily standing weight -2000 mg Na diet Infection -No evidence of ascites Bleeding -Varices Encephalopathy -None Screening -Repeat by 5/24 Transplant -Not necessary currently Vaccination -Complete Immunization series today and get follow up serologies at next visit -PCP should consider Influenza, Pneumococcal   Orders Placed This Encounter  Procedures   CBC   Hepatic function panel   INR/PT    New Prescriptions   AMBULATORY NON FORMULARY MEDICATION    Medication Name: Diltiazem 2%/ Lidocaine 5% Apply pea size amount 1/2 to 1 inch inside rectum 3-4 times daily   Modified Medications   Modified Medication Previous Medication   DICYCLOMINE (BENTYL) 10 MG CAPSULE dicyclomine (BENTYL) 10 MG capsule      TAKE 1 CAPSULE BY MOUTH 4 TIMES DAILY, BEFORE MEALS AND AT BEDTIME.    TAKE 1 CAPSULE BY MOUTH 4 TIMES DAILY, BEFORE MEALS AND AT BEDTIME.   DRONABINOL (MARINOL) 2.5 MG CAPSULE dronabinol (MARINOL) 2.5 MG capsule      Take 1 capsule (2.5 mg total) by mouth 3 (three) times daily.    TAKE 1 CAPSULE BY MOUTH 2 TIMES DAILY BEFORE A MEAL   LINACLOTIDE (LINZESS) 290 MCG CAPS CAPSULE LINZESS 290 MCG CAPS capsule      TAKE 1 CAPSULE (290 MCG TOTAL) BY MOUTH DAILY BEFORE BREAKFAST.    TAKE 1 CAPSULE (290 MCG TOTAL) BY MOUTH DAILY BEFORE BREAKFAST.    Planned Follow Up No follow-ups on file.   Total Time in Face-to-Face and in Coordination of Care for patient including independent/personal interpretation/review of prior testing, medical history, examination, medication adjustment, communicating results with the patient directly, and documentation with the EHR is 25 minutes.  Justice Britain, MD [facility] Advanced [facility]oscopy Office # [phone]

## 2022-03-08 NOTE — Patient Instructions (Addendum)
Diltiazem 2%/ Lidocaine 5% Apply pea size amount 1/2 to 1 inch inside rectum 3-4 times daily.    We have sent a prescription for Diltiazem/ Lidocaine gel to [facility]. Apply a pea size amount 1/2 to 1 inch inside rectum 3-4 times daily.   [facility]'s information is below: Address: [address] [place], [place] [address]  [phone]  *Please DO NOT go directly from our office to pick up this medication! Give the pharmacy 1 day to process the prescription as this is compounded and takes time to make.  We have sent the following medications to your pharmacy for you to pick up at your convenience: Bentyl, Linzess, and Marinol   Your provider has requested that you go to the basement level for lab work before leaving today. Press "B" on the elevator. The lab is located at the first door on the left as you exit the elevator.   Referral for 2nd opinion has been sent to [facility]. They will contact you for an appointment. If you have not heard from them in 1-2 weeks, please let us know.   Due to recent changes in healthcare laws, you may see the results of your imaging and laboratory studies on MyChart before your provider has had a chance to review them.  We understand that in some cases there may be results that are confusing or concerning to you. Not all laboratory results come back in the same time frame and the provider may be waiting for multiple results in order to interpret others.  Please give [place] 48 hours in order for your provider to thoroughly review all the results before contacting the office for clarification of your results.   Thank you for choosing me and [facility].  Dr. Rush Landmark

## 2022-03-30 ENCOUNTER — Other Ambulatory Visit: Payer: Self-pay | Admitting: Gastroenterology

## 2022-04-07 ENCOUNTER — Other Ambulatory Visit (HOSPITAL_COMMUNITY): Payer: Self-pay

## 2022-04-12 ENCOUNTER — Telehealth: Payer: Self-pay | Admitting: Gastroenterology

## 2022-04-12 NOTE — Telephone Encounter (Signed)
Inbound call from patient stating she needs a prior auth for Dexalant. Please advise.

## 2022-04-13 ENCOUNTER — Telehealth: Payer: Self-pay | Admitting: Pharmacy Technician

## 2022-04-13 ENCOUNTER — Other Ambulatory Visit (HOSPITAL_COMMUNITY): Payer: Self-pay

## 2022-04-13 NOTE — Telephone Encounter (Signed)
Patient Advocate Encounter  Received notification from [facility] that prior authorization for DEXLANSOPRAZOLE 30MG  is required.   PA submitted on 1.25.24 Key [id number] Status is pending

## 2022-04-13 NOTE — Telephone Encounter (Signed)
PA has been submitted, and telephone encounter has been created. 

## 2022-04-14 ENCOUNTER — Other Ambulatory Visit (HOSPITAL_COMMUNITY): Payer: Self-pay

## 2022-04-14 NOTE — Telephone Encounter (Signed)
Patient Advocate Encounter  Prior Authorization for DEXLANSOPRAZOLE 30MG  has been approved.    PA# [id number] Effective dates: 1.25.24 through 12.31.24

## 2022-05-11 ENCOUNTER — Other Ambulatory Visit: Payer: Self-pay | Admitting: Gastroenterology

## 2022-05-24 ENCOUNTER — Encounter: Payer: Self-pay | Admitting: Gastroenterology

## 2022-05-24 NOTE — Progress Notes (Signed)
Staff message to call pt in May for [facility] screening

## 2022-05-24 NOTE — Progress Notes (Signed)
Patient was able to be evaluated by [facility] which I greatly appreciate Based on the recommendations the patient with her F3 fibrosis is suggested to have ultrasounds of her liver every 6 months for the remainder of her life screen for liver cancer. She will be following up with [facility] every 6 months with her next one being due in May 2024. We will make sure that she has [place] screening set up for follow-up.   Justice Britain, MD [facility] Advanced [facility]oscopy Office # [phone]

## 2022-05-30 DIAGNOSIS — F330 Major depressive disorder, recurrent, mild: Secondary | ICD-10-CM | POA: Insufficient documentation

## 2022-06-06 ENCOUNTER — Ambulatory Visit (INDEPENDENT_AMBULATORY_CARE_PROVIDER_SITE_OTHER): Payer: Medicaid Other | Admitting: Behavioral Health

## 2022-06-06 DIAGNOSIS — F411 Generalized anxiety disorder: Secondary | ICD-10-CM | POA: Diagnosis not present
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
## 2022-06-06 NOTE — Addendum Note (Signed)
Addended by: Donnetta Hutching on: 06/06/2022 02:39 PM   Modules accepted: Level of Service

## 2022-06-06 NOTE — Progress Notes (Signed)
                Emelly Wurtz L Zebbie Ace, LMFT 

## 2022-06-15 ENCOUNTER — Other Ambulatory Visit: Payer: Self-pay | Admitting: Gastroenterology

## 2022-06-29 ENCOUNTER — Ambulatory Visit (INDEPENDENT_AMBULATORY_CARE_PROVIDER_SITE_OTHER): Payer: Medicaid Other | Admitting: Behavioral Health

## 2022-06-29 DIAGNOSIS — F411 Generalized anxiety disorder: Secondary | ICD-10-CM

## 2022-06-29 NOTE — Progress Notes (Signed)
                Johnwilliam Shepperson L Nichollas Perusse, LMFT 

## 2022-06-29 NOTE — Progress Notes (Signed)
[facility] Counselor/Therapist Progress Note  Patient ID: Angie Brown, MRN: [id number],    Date: 06/29/2022  Time Spent: 55 min Caregility video; Pt is home in private & Provider is working remotely from [employer]   Treatment Type: Individual Therapy  Reported Symptoms: Elevated anx/dep & stress  Mental Status Exam: Appearance:  Casual     Behavior: Appropriate and Sharing  Motor: Normal  Speech/Language:  Clear and Coherent  Affect: Appropriate  Mood: normal  Thought process: normal  Thought content:   WNL  Sensory/Perceptual disturbances:   WNL  Orientation: oriented to person, place, and time/date  Attention: Good  Concentration: Good  Memory: WNL  Fund of knowledge:  Good  Insight:   Good  Judgment:  Good  Impulse Control: Good   Risk Assessment: Danger to Self:  No Self-injurious Behavior: No Danger to Others: No Duty to Warn:no Physical Aggression / Violence:No  Access to Firearms a concern: No  Gang Involvement:No   Subjective: Pt is stressed due to her health status changes.   Interventions: Family Systems  Diagnosis:Generalized anxiety disorder  Plan: Angie Brown is dealing w/multiple health issues that are causing her problems w/her adjustment to living. She is unable to work & the other 2 Young Adults in the home are doing well. She has been confined to the home since Aug 2023, although she tries to "talk myself up" to get out of the home. Angie Brown is reviewing her relationship w/her Jeri Modena & [name]. She will cont to write about this in her Notebook btwn sessions.   Target Date: 08/02/2022  Progress: 4  Frequency: Once every 3-4 wks  Modality: [name], LMFT

## 2022-07-10 ENCOUNTER — Other Ambulatory Visit: Payer: Self-pay | Admitting: Gastroenterology

## 2022-07-10 MED ORDER — DRONABINOL 2.5 MG PO CAPS: 2.5000 mg | ORAL_CAPSULE | Freq: Three times a day (TID) | ORAL | 0 refills | Status: DC

## 2022-07-19 ENCOUNTER — Other Ambulatory Visit (HOSPITAL_COMMUNITY): Payer: Self-pay

## 2022-07-19 ENCOUNTER — Other Ambulatory Visit (HOSPITAL_BASED_OUTPATIENT_CLINIC_OR_DEPARTMENT_OTHER): Payer: Self-pay

## 2022-07-19 ENCOUNTER — Telehealth: Payer: Self-pay

## 2022-07-19 DIAGNOSIS — K760 Fatty (change of) liver, not elsewhere classified: Secondary | ICD-10-CM

## 2022-07-19 NOTE — Telephone Encounter (Signed)
Patient called to return phone call, requesting a call back. Please advise, thank you.

## 2022-07-19 NOTE — Telephone Encounter (Signed)
Spoke to patient. Patient aware schedulers will contact her to schedule ultrasound.

## 2022-07-19 NOTE — Telephone Encounter (Signed)
-----   Message from Loretha Stapler, RN sent at 05/24/2022 11:18 AM EST ----- [facility] screening every 6 months next due in May 2024 for this patient. Thanks.

## 2022-07-19 NOTE — Telephone Encounter (Signed)
Order entered and sent to schedulers. Left message for patient to call back.

## 2022-07-20 ENCOUNTER — Ambulatory Visit (INDEPENDENT_AMBULATORY_CARE_PROVIDER_SITE_OTHER): Payer: Medicaid Other | Admitting: Behavioral Health

## 2022-07-20 DIAGNOSIS — F411 Generalized anxiety disorder: Secondary | ICD-10-CM | POA: Diagnosis not present

## 2022-07-20 NOTE — Progress Notes (Signed)
                Nekeisha Aure L Gennifer Potenza, LMFT 

## 2022-07-20 NOTE — Progress Notes (Signed)
[facility] Counselor/Therapist Progress Note  Patient ID: SHAYLAN TUTTON, MRN: [id number],    Date: 07/20/2022  Time Spent: 55 min Caregility video; Pt is home in private & Provider working remotely from [employer]   Treatment Type: Individual Therapy  Reported Symptoms: Elevated anx/dep  Mental Status Exam: Appearance:  Casual     Behavior: Appropriate and Sharing  Motor: Normal  Speech/Language:  Clear and Coherent  Affect: Appropriate  Mood: normal  Thought process: normal  Thought content:   WNL  Sensory/Perceptual disturbances:   WNL  Orientation: oriented to person, place, and time/date  Attention: Good  Concentration: Good  Memory: WNL  Fund of knowledge:  Good  Insight:   Fair  Judgment:  Good  Impulse Control: Good   Risk Assessment: Danger to Self:  No Self-injurious Behavior: No Danger to Others: No Duty to Warn:no Physical Aggression / Violence:No  Access to Firearms a concern: No  Gang Involvement:No   Subjective: Pt is reviewing her life decisions & feels regretful due to the estrangement of her 2 Sons. She has many regrets about growing up, being hateful to others, & being impulsive. She does not hold this image of herself & resists the suggestion of others to be her, "old self".   Pt is holding upset over the Family dynamics that impact her in neg ways. She is learning from her 43yo Dtr, who lives w/her, about her own upbringing.  Interventions: Family Systems  Diagnosis:Generalized anxiety disorder  Plan: Tosha is elevated today & expressing her upset about her children who are estranged & her Dtr who lives w/her & they are getting along. She does not need input from other ppl about her Parenting Style or how she has brought up her children. Joselle will try to use her Notebook to process her thoughts & feelings btwn sessions.   Target Date: 09/02/2022  Progress: 4  Frequency: Once every 3-4 wks  Modality: [name],  LMFT

## 2022-07-26 ENCOUNTER — Other Ambulatory Visit: Payer: Self-pay | Admitting: Gastroenterology

## 2022-07-26 DIAGNOSIS — Z6830 Body mass index (BMI) 30.0-30.9, adult: Secondary | ICD-10-CM | POA: Insufficient documentation

## 2022-07-28 ENCOUNTER — Other Ambulatory Visit (HOSPITAL_COMMUNITY): Payer: Self-pay

## 2022-08-01 ENCOUNTER — Telehealth: Payer: Self-pay | Admitting: Gastroenterology

## 2022-08-01 ENCOUNTER — Other Ambulatory Visit: Payer: Self-pay | Admitting: Gastroenterology

## 2022-08-01 MED ORDER — DRONABINOL 2.5 MG PO CAPS: 2.5000 mg | ORAL_CAPSULE | Freq: Three times a day (TID) | ORAL | 0 refills | Status: AC

## 2022-08-01 NOTE — Telephone Encounter (Signed)
Patients sister called to advise the Dexliant medication requires PA.

## 2022-08-03 ENCOUNTER — Other Ambulatory Visit (HOSPITAL_COMMUNITY): Payer: Self-pay

## 2022-08-03 NOTE — Telephone Encounter (Signed)
PA has already been approved. Must fill for [facility] name Dexilant. Test billing results return a $4 copay. Will call pharmacy to have them process.

## 2022-08-10 ENCOUNTER — Ambulatory Visit (HOSPITAL_COMMUNITY)
Admission: RE | Admit: 2022-08-10 | Discharge: 2022-08-10 | Disposition: A | Discharge status: Home / Self Care | Payer: Medicaid Other | Source: Ambulatory Visit | Attending: Gastroenterology | Admitting: Gastroenterology

## 2022-08-10 DIAGNOSIS — K760 Fatty (change of) liver, not elsewhere classified: Secondary | ICD-10-CM | POA: Diagnosis present

## 2022-08-11 ENCOUNTER — Ambulatory Visit (INDEPENDENT_AMBULATORY_CARE_PROVIDER_SITE_OTHER): Payer: Medicaid Other | Admitting: Behavioral Health

## 2022-08-11 DIAGNOSIS — F411 Generalized anxiety disorder: Secondary | ICD-10-CM | POA: Diagnosis not present

## 2022-08-11 NOTE — Progress Notes (Signed)
                Jonathan Corpus L Angelino Rumery, LMFT 

## 2022-08-11 NOTE — Progress Notes (Signed)
[facility] Counselor/Therapist Progress Note  Patient ID: Angie Brown, MRN: [id number],    Date: 08/11/2022  Time Spent: 55 min Caregility video; Pt is home in private & Provider is working remote from [employer]   Treatment Type: Individual Therapy  Reported Symptoms: Elevated anx/dep & stress due to estrangement from Sons Angie Brown & Angie Brown  Mental Status Exam: Appearance:  Casual     Behavior: Appropriate and Sharing  Motor: Normal  Speech/Language:  Clear and Coherent  Affect: Appropriate  Mood: Tearful & sad for Family dynamics  Thought process: normal  Thought content:   WNL  Sensory/Perceptual disturbances:   WNL  Orientation: oriented to person, place, and time/date  Attention: Good  Concentration: Good  Memory: WNL  Fund of knowledge:  Good  Insight:   Good  Judgment:  Good  Impulse Control: Good   Risk Assessment: Danger to Self:  No Self-injurious Behavior: No Danger to Others: No Duty to Warn:no Physical Aggression / Violence:No  Access to Firearms a concern: No  Gang Involvement:No   Subjective: Angie Brown is clear she wants psycho-pharmacologicals today.  She sees her Psychiatrist on 08/23/2022 [name], MD). Pt feels it will make her life situation better. It is noted today that Pt would benefit from a mood stabilizer that assists her to function, cope, & accept her health status changes to live her full life.  Interventions: Family Systems  Diagnosis:Generalized anxiety disorder  Plan: Angie Brown will cont to record in her Notebook btwn sessions. It helps her to process events that happen w/her children; Angie Brown, & Angie Brown. She will strive to be the best Mother possible. Angie Brown will limit her exposure to other Family members who are toxic. She will cont to build her relationship w/her Strs.  Target Date: 09/17/2022  Progress: 3  Frequency: Once every 3-4 wks  Modality: [name], LMFT

## 2022-09-07 ENCOUNTER — Ambulatory Visit (INDEPENDENT_AMBULATORY_CARE_PROVIDER_SITE_OTHER): Payer: Medicaid Other | Admitting: Behavioral Health

## 2022-09-07 DIAGNOSIS — F411 Generalized anxiety disorder: Secondary | ICD-10-CM

## 2022-09-07 NOTE — Progress Notes (Addendum)
[facility] Counselor/Therapist Progress Note  Patient ID: Angie Brown, MRN: [id number],    Date: 09/07/2022  Time Spent: 55 min Caregility video; Pt is home in private & Provider working remotely from [employer]. Pt consents to Tx today & is aware of the risks/limitations of the telehealth platform.  Treatment Type: Individual Therapy  Reported Symptoms: Elevated anx/dep & stress due to Son [employer] to [place] via the [employer]. Son is in a Combat Zone.  Mental Status Exam: Appearance:  Casual     Behavior: Appropriate and Sharing  Motor: Normal  Speech/Language:  Clear and Coherent  Affect: Appropriate  Mood: normal  Thought process: normal  Thought content:   WNL  Sensory/Perceptual disturbances:   WNL  Orientation: oriented to person, place, and time/date  Attention: Good  Concentration: Good  Memory: WNL  Fund of knowledge:  Good  Insight:   Good  Judgment:  Good  Impulse Control: Good   Risk Assessment: Danger to Self:  No Self-injurious Behavior: No Danger to Others: No Duty to Warn:no Physical Aggression / Violence:No  Access to Firearms a concern: No  Gang Involvement:No   Subjective: Pt is upset over Son [employer] to [place] for the past month. She is beside herself due to the stress of his decisions. She is trying to place some of the past Hx w/the children's Parents on the boys' Dad.    Pt is realizing she is not the only Parent in the process. She will accept the fact that her children may need the time necessary to become their own person.  Interventions: Family Systems  Diagnosis:Generalized anxiety disorder  Plan: Dameisha will cont to support her 3 children in all situations as best she can. She will accept the validation of her Husb's Ex-Wife in her comments about the Family. She will work to feel better about herself & her former marriage to her Ex-Husb. She is re-visiting her teenhood & all the messages she listened  to as a Teen. This is helping her to revise her opinion of herself.  Target Date: 10/17/2022  Progress: 5  Frequency: Once every 3-4 wks  Modality: [name], LMFT

## 2022-09-07 NOTE — Progress Notes (Signed)
                Angie Harpham L Cayne Yom, LMFT 

## 2022-09-29 ENCOUNTER — Ambulatory Visit (INDEPENDENT_AMBULATORY_CARE_PROVIDER_SITE_OTHER): Payer: Medicaid Other | Admitting: Behavioral Health

## 2022-09-29 DIAGNOSIS — F411 Generalized anxiety disorder: Secondary | ICD-10-CM

## 2022-09-29 NOTE — Progress Notes (Signed)
[facility] Counselor/Therapist Progress Note  Patient ID: Angie Brown, MRN: [id number],    Date: 09/29/2022  Time Spent: 55 min Caregility video; Pt is home in private & Provider is working remote from [employer]. Pt is aware of risks/limitations of telehealth & consents to therapy today.  Time In: 1:00pm Time Out: 1:55pm    Treatment Type: Individual Therapy  Reported Symptoms: Elevated anx/dep & stress  Mental Status Exam: Appearance:  Casual     Behavior: Appropriate and Sharing  Motor: Normal  Speech/Language:  Clear and Coherent  Affect: Appropriate  Mood: anxious & angry  Thought process: normal  Thought content:   WNL  Sensory/Perceptual disturbances:   WNL  Orientation: oriented to person, place, time/date, and situation  Attention: Good  Concentration: Good  Memory: WNL  Fund of knowledge:  Good  Insight:   Good  Judgment:  Fair  Impulse Control: Fair   Risk Assessment: Danger to Self:  No Self-injurious Behavior: No Danger to Others: No Duty to Warn:no Physical Aggression / Violence:No  Access to Firearms a concern: No  Gang Involvement:No   Subjective: Pt is upset over the new indiv she has met from the [employer] site. She does not want to entertain anyone. She is interested in him. She is grieving her lost loved-ones.   The past week has been improved bc her Nephew's friend Angie Brown has lft the home.    Interventions: Family Systems  Diagnosis:Generalized anxiety disorder  Plan: Angie Brown is trying to adjust to her Dtr's dvpmt as a Young Adult. She cannot secure a new computer for her [employer] job w/friend Angie Brown. Her Str will assist her w/the loan of a new laptop. She will move forward making decisions that benefit herself first. She will initiate self-care steps that promote her to a better lifestyle & strengths.  Target Date: 11/17/2022  Progress: 2  Frequency: Once every 3-4 wks  Modality: Angie Fraise,  LMFT

## 2022-09-29 NOTE — Progress Notes (Signed)
                Rodriguez Aguinaldo L Kaisen Ackers, LMFT 

## 2022-09-30 ENCOUNTER — Emergency Department (HOSPITAL_COMMUNITY): Payer: Medicaid Other

## 2022-09-30 ENCOUNTER — Emergency Department (HOSPITAL_COMMUNITY)
Admission: EM | Admit: 2022-09-30 | Discharge: 2022-09-30 | Disposition: A | Discharge status: Home / Self Care | Payer: Medicaid Other | Attending: Emergency Medicine | Admitting: Emergency Medicine

## 2022-09-30 ENCOUNTER — Telehealth (HOSPITAL_COMMUNITY): Payer: Self-pay | Admitting: Emergency Medicine

## 2022-09-30 DIAGNOSIS — G43109 Migraine with aura, not intractable, without status migrainosus: Secondary | ICD-10-CM

## 2022-09-30 DIAGNOSIS — R112 Nausea with vomiting, unspecified: Secondary | ICD-10-CM | POA: Diagnosis not present

## 2022-09-30 DIAGNOSIS — R519 Headache, unspecified: Secondary | ICD-10-CM | POA: Diagnosis present

## 2022-09-30 DIAGNOSIS — N390 Urinary tract infection, site not specified: Secondary | ICD-10-CM | POA: Insufficient documentation

## 2022-09-30 DIAGNOSIS — Z91040 Latex allergy status: Secondary | ICD-10-CM | POA: Diagnosis not present

## 2022-09-30 DIAGNOSIS — G43909 Migraine, unspecified, not intractable, without status migrainosus: Secondary | ICD-10-CM | POA: Diagnosis not present

## 2022-09-30 LAB — COMPREHENSIVE METABOLIC PANEL
ALT: 7 U/L (ref 0–44)
AST: 14 U/L — ABNORMAL LOW (ref 15–41)
Albumin: 3 g/dL — ABNORMAL LOW (ref 3.5–5.0)
Alkaline Phosphatase: 56 U/L (ref 38–126)
Anion gap: 8 (ref 5–15)
BUN: 6 mg/dL (ref 6–20)
CO2: 25 mmol/L (ref 22–32)
Calcium: 8.4 mg/dL — ABNORMAL LOW (ref 8.9–10.3)
Chloride: 103 mmol/L (ref 98–111)
Creatinine, Ser: 0.93 mg/dL (ref 0.44–1.00)
GFR, Estimated: >60 mL/min (ref >60)
Glucose, Bld: 82 mg/dL (ref 70–99)
Potassium: 3.7 mmol/L (ref 3.5–5.1)
Sodium: 136 mmol/L (ref 135–145)
Total Bilirubin: 0.7 mg/dL (ref 0.3–1.2)
Total Protein: 6 g/dL — ABNORMAL LOW (ref 6.5–8.1)

## 2022-09-30 LAB — CBC WITH DIFFERENTIAL/PLATELET
Abs Immature Granulocytes: 0.07 10*3/uL (ref 0.00–0.07)
Basophils Absolute: 0 10*3/uL (ref 0.0–0.1)
Basophils Relative: 1 %
Eosinophils Absolute: 0.1 10*3/uL (ref 0.0–0.5)
Eosinophils Relative: 1 %
HCT: 30.3 % — ABNORMAL LOW (ref 36.0–46.0)
Hemoglobin: 10.5 g/dL — ABNORMAL LOW (ref 12.0–15.0)
Immature Granulocytes: 1 %
Lymphocytes Relative: 26 %
Lymphs Abs: 2 10*3/uL (ref 0.7–4.0)
MCH: 31.5 pg (ref 26.0–34.0)
MCHC: 34.7 g/dL (ref 30.0–36.0)
MCV: 91 fL (ref 80.0–100.0)
Monocytes Absolute: 0.4 10*3/uL (ref 0.1–1.0)
Monocytes Relative: 5 %
Neutro Abs: 5.2 10*3/uL (ref 1.7–7.7)
Neutrophils Relative %: 66 %
Platelets: 226 10*3/uL (ref 150–400)
RBC: 3.33 MIL/uL — ABNORMAL LOW (ref 3.87–5.11)
RDW: 12.4 % (ref 11.5–15.5)
WBC: 7.8 10*3/uL (ref 4.0–10.5)
nRBC: 0 % (ref 0.0–0.2)

## 2022-09-30 LAB — RAPID URINE DRUG SCREEN, HOSP PERFORMED
Amphetamines: NOT DETECTED
Barbiturates: NOT DETECTED
Benzodiazepines: NOT DETECTED
Cocaine: NOT DETECTED
Opiates: NOT DETECTED
Tetrahydrocannabinol: POSITIVE — AB

## 2022-09-30 LAB — URINALYSIS, W/ REFLEX TO CULTURE (INFECTION SUSPECTED)
Bilirubin Urine: NEGATIVE
Glucose, UA: NEGATIVE mg/dL
Hgb urine dipstick: NEGATIVE
Ketones, ur: 5 mg/dL — AB
Nitrite: POSITIVE — AB
Protein, ur: NEGATIVE mg/dL
Specific Gravity, Urine: 1.009 (ref 1.005–1.030)
WBC, UA: >50 WBC/hpf (ref 0–5)
pH: 6 (ref 5.0–8.0)

## 2022-09-30 LAB — AMMONIA: Ammonia: 15 umol/L (ref 9–35)

## 2022-09-30 LAB — ETHANOL: Alcohol, Ethyl (B): <10 mg/dL (ref <10)

## 2022-09-30 LAB — PROTIME-INR
INR: 1.1 (ref 0.8–1.2)
Prothrombin Time: 14.8 seconds (ref 11.4–15.2)

## 2022-09-30 LAB — HCG, SERUM, QUALITATIVE: Preg, Serum: NEGATIVE

## 2022-09-30 MED ORDER — CEPHALEXIN 500 MG PO CAPS: 500.0000 mg | ORAL_CAPSULE | Freq: Two times a day (BID) | ORAL | 0 refills | Status: DC

## 2022-09-30 MED ORDER — IOHEXOL 350 MG/ML SOLN
75.0000 mL | Freq: Once | INTRAVENOUS | Status: AC | PRN
  Administered 2022-09-30: 75 mL via INTRAVENOUS

## 2022-09-30 MED ORDER — MAGNESIUM SULFATE 2 GM/50ML IV SOLN
2.0000 g | Freq: Once | INTRAVENOUS | Status: AC
  Administered 2022-09-30: 2 g via INTRAVENOUS
  Filled 2022-09-30: qty 50

## 2022-09-30 MED ORDER — CEPHALEXIN 250 MG PO CAPS
500.0000 mg | ORAL_CAPSULE | Freq: Once | ORAL | Status: AC
  Administered 2022-09-30: 500 mg via ORAL
  Filled 2022-09-30: qty 2

## 2022-09-30 MED ORDER — SODIUM CHLORIDE 0.9 % IV BOLUS
500.0000 mL | Freq: Once | INTRAVENOUS | Status: AC
  Administered 2022-09-30: 500 mL via INTRAVENOUS

## 2022-09-30 MED ORDER — METOCLOPRAMIDE HCL 5 MG/ML IJ SOLN
10.0000 mg | Freq: Once | INTRAMUSCULAR | Status: AC
  Administered 2022-09-30: 10 mg via INTRAVENOUS
  Filled 2022-09-30: qty 2

## 2022-09-30 MED ORDER — LORAZEPAM 1 MG PO TABS
0.5000 mg | ORAL_TABLET | Freq: Once | ORAL | Status: AC
  Administered 2022-09-30: 0.5 mg via ORAL
  Filled 2022-09-30: qty 1

## 2022-09-30 MED ORDER — GADOBUTROL 1 MMOL/ML IV SOLN
10.0000 mL | Freq: Once | INTRAVENOUS | Status: AC | PRN
  Administered 2022-09-30: 8 mL via INTRAVENOUS

## 2022-09-30 NOTE — ED Notes (Addendum)
Pt is a&ox4, pwd. Pt says that she is feeling much better, a slight head pressure but nothing at all like when she first came in. Pt is attached to monitor/vitals. Side rails up x 2. Call light within reach.

## 2022-09-30 NOTE — ED Notes (Signed)
Patient states she is very claustrophobic and may need some medicine beforehand. Provider has been notified.

## 2022-09-30 NOTE — ED Triage Notes (Signed)
Pt via EMS from home with complaint of migraine onset 2 days ago with hx of migraines. Reporting slurred speech, right sided weakness, and photophobia X 2 days. Also having right shoulder pain. Pt having unwell general appearance. Onset sx 2 days.   CBG 88  BP 112/86 HR 90 20G LAC IV Given 4 mg Zofran and 150 cc fluid en route 12-lead clear en route

## 2022-09-30 NOTE — ED Provider Notes (Signed)
[place] EMERGENCY DEPARTMENT AT [facility] Provider Note   CSN: [id number] Arrival date & time: 09/30/22  0008     History  Chief Complaint  Patient presents with   Migraine   Weakness    Angie Brown is a 42 y.o. female.  The history is provided by the patient, the EMS personnel and medical records.  Migraine  Weakness Angie Brown is a 42 y.o. female who presents to the Emergency Department complaining of headache.  Presents to the emergency department by EMS for evaluation of severe occipital headache, right-sided weakness that started on Wednesday.  She has associated nausea, vomiting.  No reported fevers.  She has photophobia.  She does have a history of migraine headaches but this is not typical for her migraines.  Usually her migraines are frontal.  No reported injuries.  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  Has dysuria.     Home Medications Prior to Admission medications   Medication Sig Start Date End Date Taking? Authorizing Provider  cephALEXin (KEFLEX) 500 MG capsule Take 1 capsule (500 mg total) by mouth 2 (two) times daily. 09/30/22  Yes Tilden Fossa, MD  albuterol (PROVENTIL) (2.5 MG/3ML) 0.083% nebulizer solution Take 2.5 mg by nebulization every 6 (six) hours as needed for wheezing or shortness of breath.  04/03/19   [provider]  albuterol (VENTOLIN HFA) 108 (90 Base) MCG/ACT inhaler Inhale 1-2 puffs into the lungs every 6 (six) hours as needed for wheezing or shortness of breath. 04/01/19   Hall-Potvin, [place], PA-C  AMBULATORY NON FORMULARY MEDICATION Medication Name: Diltiazem 2%/ Lidocaine 5% Apply pea size amount 1/2 to 1 inch inside rectum 3-4 times daily 03/08/22   Mansouraty, Netty Starring., MD  Dexlansoprazole 30 MG capsule DR TAKE 1 CAPSULE (30 MG TOTAL) BY MOUTH DAILY. 07/27/22   Mansouraty, Netty Starring., MD  dicyclomine (BENTYL) 10 MG capsule TAKE 1 CAPSULE BY MOUTH 4 TIMES DAILY, BEFORE MEALS AND AT  BEDTIME. 03/08/22   Mansouraty, Netty Starring., MD  diphenhydrAMINE (BENADRYL) 25 mg capsule Take 25 mg by mouth at bedtime as needed for allergies.     [provider]  dronabinol (MARINOL) 2.5 MG capsule Take 1 capsule (2.5 mg total) by mouth 3 (three) times daily. 08/01/22   Mansouraty, Netty Starring., MD  EPINEPHrine 0.3 mg/0.3 mL IJ SOAJ injection Inject 0.3 mg into the muscle as needed for anaphylaxis. 12/16/20   Ellamae Sia, DO  fluticasone (FLONASE) 50 MCG/ACT nasal spray Place 1 spray into both nostrils 2 (two) times daily as needed (nasal congestion). 12/16/20   Ellamae Sia, DO  folic acid (FOLVITE) 1 MG tablet Take 1 mg by mouth daily. 09/09/19   [provider]  hydrOXYzine (VISTARIL) 25 MG capsule Take 25 mg by mouth 3 (three) times daily as needed for anxiety.    [provider]  levocetirizine (XYZAL) 5 MG tablet TAKE 1 TABLET BY MOUTH EVERY EVENING. 07/25/21   Ellamae Sia, DO  levothyroxine (SYNTHROID) 50 MCG tablet Take 50 mcg by mouth daily before breakfast.    [provider]  linaclotide (LINZESS) 290 MCG CAPS capsule TAKE 1 CAPSULE (290 MCG TOTAL) BY MOUTH DAILY BEFORE BREAKFAST. 03/08/22   Mansouraty, Netty Starring., MD  Melatonin 10 MG CAPS Take 1 capsule by mouth as needed.    [provider]  montelukast (SINGULAIR) 10 MG tablet Take 1 tablet (10 mg total) by mouth at bedtime. 12/16/20  Ellamae Sia, DO  OZEMPIC, 1 MG/DOSE, 4 MG/3ML SOPN Inject 1 mg into the skin once a week.    [provider]  promethazine (PHENERGAN) 25 MG tablet TAKE 1 TABLET BY MOUTH EVERY 6 HOURS AS NEEDED. 06/15/22   Mansouraty, Netty Starring., MD  rizatriptan (MAXALT-MLT) 10 MG disintegrating tablet DISSOLVE 1 TABLET ON THE TONGUE AS NEEDED FOR MIGRAINE. MAY REPEAT IN 2 HOURS IF NEEDED. 01/14/21   [provider]  sucralfate (CARAFATE) 1 GM/10ML suspension Take 10 mLs (1 g total) by mouth 2 (two) times daily. 10/25/20 10/25/21  Mansouraty, Netty Starring., MD   Vitamin D, Ergocalciferol, (DRISDOL) 1.25 MG (50000 UNIT) CAPS capsule Take 50,000 Units by mouth once a week. 09/09/19   [provider]      Allergies    Loratadine, Wasp venom, Latex, Sumatriptan, Hepatitis a vaccine, Tape, Doxycycline, Ibuprofen, and Methocarbamol    Review of Systems   Review of Systems  Neurological:  Positive for weakness.  All other systems reviewed and are negative.   Physical Exam Updated Vital Signs BP (!) 120/92   Pulse 94   Temp (!) 97.5 F (36.4 C) (Oral)   Resp 18   Ht 5\' 7"  (1.702 m)   Wt 81.6 kg   SpO2 98%   BMI 28.19 kg/m  Physical Exam Vitals and nursing note reviewed.  Constitutional:      General: She is in acute distress.     Appearance: She is well-developed.  HENT:     Head: Normocephalic and atraumatic.  Cardiovascular:     Rate and Rhythm: Normal rate and regular rhythm.     Heart sounds: No murmur heard. Pulmonary:     Effort: Pulmonary effort is normal. No respiratory distress.     Breath sounds: Normal breath sounds.  Abdominal:     Palpations: Abdomen is soft.     Tenderness: There is no abdominal tenderness. There is no guarding or rebound.  Musculoskeletal:        General: No tenderness.  Skin:    General: Skin is warm and dry.     Coloration: Skin is pale.  Neurological:     Mental Status: She is alert and oriented to person, place, and time.  Psychiatric:     Comments: Significant photophobia.  EOMI.  Visual fields grossly intact.  There is 4 out of 5 right upper extremity, right lower extremity strength.  Slightly decreased sensation to light touch over the right lower extremity.  No facial droop.     ED Results / Procedures / Treatments   Labs (all labs ordered are listed, but only abnormal results are displayed) Labs Reviewed  COMPREHENSIVE METABOLIC PANEL - Abnormal; Notable for the following components:      Result Value   Calcium 8.4 (*)    Total Protein 6.0 (*)    Albumin 3.0 (*)    AST 14  (*)    All other components within normal limits  CBC WITH DIFFERENTIAL/PLATELET - Abnormal; Notable for the following components:   RBC 3.33 (*)    Hemoglobin 10.5 (*)    HCT 30.3 (*)    All other components within normal limits  URINALYSIS, W/ REFLEX TO CULTURE (INFECTION SUSPECTED) - Abnormal; Notable for the following components:   APPearance HAZY (*)    Ketones, ur 5 (*)    Nitrite POSITIVE (*)    Leukocytes,Ua LARGE (*)    Bacteria, UA RARE (*)    All other components within normal limits  RAPID URINE DRUG SCREEN, HOSP PERFORMED - Abnormal; Notable for the following components:   Tetrahydrocannabinol POSITIVE (*)    All other components within normal limits  URINE CULTURE  ETHANOL  AMMONIA  HCG, SERUM, QUALITATIVE  PROTIME-INR    EKG EKG Interpretation Date/Time:  Saturday September 30 2022 00:11:56 EDT Ventricular Rate:  82 PR Interval:  135 QRS Duration:  88 QT Interval:  383 QTC Calculation: 448 R Axis:   16  Text Interpretation: Sinus rhythm Low voltage, precordial leads Confirmed by Tilden Fossa [phone]) on 09/30/2022 12:47:29 AM  Radiology MR BRAIN W WO CONTRAST  Result Date: 09/30/2022 CLINICAL DATA:  42 year old female with slurred speech, headache, photophobia, right side weakness. EXAM: MRI HEAD WITHOUT AND WITH CONTRAST TECHNIQUE: Multiplanar, multiecho pulse sequences of the brain and surrounding structures were obtained without and with intravenous contrast. CONTRAST:  8mL GADAVIST GADOBUTROL 1 MMOL/ML IV SOLN COMPARISON:  CT head, CTA head and neck 0042 hours today. Previous brain MRI 05/08/2009. FINDINGS: Brain: Normal cerebral volume. No restricted diffusion to suggest acute infarction. No midline shift, mass effect, evidence of mass lesion, ventriculomegaly, extra-axial collection or acute intracranial hemorrhage. Cervicomedullary junction and pituitary are within normal limits. Wallace Cullens and white matter signal is within normal limits throughout the brain. No  encephalomalacia. No chronic cerebral blood products. No abnormal enhancement identified. No dural thickening. Vascular: Major intracranial vascular flow voids are stable since 2011. Major dural venous sinuses are enhancing and appear to be patent. Skull and upper cervical spine: Negative visible cervical spine. Visualized bone marrow signal is within normal limits. Sinuses/Orbits: Negative. Other: Paranasal sinuses are clear. Visible internal auditory structures appear normal. Negative visible scalp and face. IMPRESSION: Normal MRI appearance of the Brain. Electronically Signed   By: Odessa Fleming M.D.   On: 09/30/2022 07:09   DG Chest Port 1 View  Result Date: 09/30/2022 CLINICAL DATA:  Headache and right-sided weakness. EXAM: PORTABLE CHEST 1 VIEW COMPARISON:  PA and lateral 05/14/2011 FINDINGS: The heart size and mediastinal contours are within normal limits. Both lungs are clear with mild chronic elevation of the right diaphragm. The visualized skeletal structures are unremarkable. Multiple overlying monitor wires. IMPRESSION: No active disease. Electronically Signed   By: Almira Bar M.D.   On: 09/30/2022 01:31   CT Angio Head Neck W WO CM  Result Date: 09/30/2022 CLINICAL DATA:  Migraine, slurred speech, right-sided weakness, photophobia EXAM: CT ANGIOGRAPHY HEAD AND NECK WITH AND WITHOUT CONTRAST TECHNIQUE: Multidetector CT imaging of the head and neck was performed using the standard protocol during bolus administration of intravenous contrast. Multiplanar CT image reconstructions and MIPs were obtained to evaluate the vascular anatomy. Carotid stenosis measurements (when applicable) are obtained utilizing NASCET criteria, using the distal internal carotid diameter as the denominator. RADIATION DOSE REDUCTION: This exam was performed according to the departmental dose-optimization program which includes automated exposure control, adjustment of the mA and/or kV according to patient size and/or use of  iterative reconstruction technique. CONTRAST:  75mL OMNIPAQUE IOHEXOL 350 MG/ML SOLN COMPARISON:  No prior CTA available, correlation is made with 05/20/2020 CT head FINDINGS: CT HEAD FINDINGS Brain: No evidence of acute infarct, hemorrhage, mass, mass effect, or midline shift. No hydrocephalus or extra-axial fluid collection. Vascular: No hyperdense vessel. Skull: Negative for fracture or focal lesion. Sinuses/Orbits: No acute finding. Other: The mastoid air cells are well aerated. CTA NECK FINDINGS Aortic arch: Standard branching. Imaged portion shows no evidence of aneurysm or dissection. No significant stenosis of the major arch vessel  origins. Right carotid system: No evidence of stenosis, dissection, or occlusion. Left carotid system: No evidence of stenosis, dissection, or occlusion. Vertebral arteries: No evidence of stenosis, dissection, or occlusion. Skeleton: No acute osseous abnormality. Other neck: No acute finding. Upper chest: No focal pulmonary opacity or pleural effusion. Review of the MIP images confirms the above findings CTA HEAD FINDINGS Anterior circulation: Both internal carotid arteries are patent to the termini, without significant stenosis. A1 segments patent. Normal anterior communicating artery. Anterior cerebral arteries are patent to their distal aspects without significant stenosis. No M1 stenosis or occlusion. MCA branches perfused to their distal aspects without significant stenosis. Posterior circulation: Vertebral arteries patent to the vertebrobasilar junction without significant stenosis. Basilar patent to its distal aspect without significant stenosis. Superior cerebellar arteries patent proximally. Patent P1 segments. PCAs perfused to their distal aspects without significant stenosis. The bilateral posterior communicating arteries are patent. Venous sinuses: Well opacified, patent. Anatomic variants: None significant. Review of the MIP images confirms the above findings  IMPRESSION: 1. No acute intracranial process. 2. No intracranial large vessel occlusion or significant stenosis. 3. No hemodynamically significant stenosis in the neck. Electronically Signed   By: Wiliam Ke M.D.   On: 09/30/2022 00:57    Procedures Procedures   CRITICAL CARE Performed by: Tilden Fossa   Total critical care time: 40 minutes  Critical care time was exclusive of separately billable procedures and treating other patients.  Critical care was necessary to treat or prevent imminent or life-threatening deterioration.  Critical care was time spent personally by me on the following activities: development of treatment plan with patient and/or surrogate as well as nursing, discussions with consultants, evaluation of patient's response to treatment, examination of patient, obtaining history from patient or surrogate, ordering and performing treatments and interventions, ordering and review of laboratory studies, ordering and review of radiographic studies, pulse oximetry and re-evaluation of patient's condition.  Medications Ordered in ED Medications  metoCLOPramide (REGLAN) injection 10 mg (10 mg Intravenous Given 09/30/22 0050)  iohexol (OMNIPAQUE) 350 MG/ML injection 75 mL (75 mLs Intravenous Contrast Given 09/30/22 0046)  magnesium sulfate IVPB 2 g 50 mL (0 g Intravenous Stopped 09/30/22 0258)  cephALEXin (KEFLEX) capsule 500 mg (500 mg Oral Given 09/30/22 0354)  sodium chloride 0.9 % bolus 500 mL (0 mLs Intravenous Stopped 09/30/22 0437)  LORazepam (ATIVAN) tablet 0.5 mg (0.5 mg Oral Given 09/30/22 0440)  gadobutrol (GADAVIST) 1 MMOL/ML injection 10 mL (8 mLs Intravenous Contrast Given 09/30/22 1610)    ED Course/ Medical Decision Making/ A&P                             Medical Decision Making Amount and/or Complexity of Data Reviewed Labs: ordered. Radiology: ordered.  Risk Prescription drug management.   Patient with history of migraines, liver disease here for  evaluation of headache, right-sided weakness.  Patient ill-appearing on evaluation with severe photophobia, difficulty providing history with focal right-sided weakness with last known well 2 days ago.  Due to concern for acute CVA out of the tPA window a code medical was activated to expedite workup.  CTA is negative for acute large vessel occlusion.  She was treated with medications for migraine headache with significant improvement in her symptoms.  Discussed with on-call neurologist-recommend to obtain MRI to rule out CVA versus MS.  MRI was obtained, which is negative for acute process.  Patient continues to feel improved on recheck.  UA is consistent  with UTI.  Patient does dysuria but no additional symptoms.  Will treat with antibiotics and send culture.  Labs significant for new anemia-no reported bleeding.  Discussed this finding with patient and need for outpatient follow-up.  Plan to discharge with PCP follow-up and return precautions for complicated migraine, UTI, anemia.       Final Clinical Impression(s) / ED Diagnoses Final diagnoses:  Acute UTI  Complicated migraine    Rx / DC Orders ED Discharge Orders          Ordered    cephALEXin (KEFLEX) 500 MG capsule  2 times daily        09/30/22 0319              Tilden Fossa, MD 09/30/22 [phone]

## 2022-09-30 NOTE — Telephone Encounter (Signed)
Pharmacy changed

## 2022-09-30 NOTE — ED Notes (Signed)
Pt ambulatory to the restroom, states feels better. Reports pain down to 2/10 at this time. Noted ambulatory without assist.

## 2022-09-30 NOTE — ED Notes (Signed)
MRI reports will send for patient shortly.

## 2022-10-02 LAB — URINE CULTURE: Culture: >=100000 — AB

## 2022-10-03 ENCOUNTER — Telehealth (HOSPITAL_BASED_OUTPATIENT_CLINIC_OR_DEPARTMENT_OTHER): Payer: Self-pay | Admitting: *Deleted

## 2022-10-03 NOTE — Telephone Encounter (Signed)
Post ED Visit - Positive Culture Follow-up  Culture report reviewed by antimicrobial stewardship pharmacist: [facility] [x]  Daylene Posey, Pharm.D. []  Celedonio Miyamoto, [address].D., BCPS AQ-ID []  Garvin Fila, Pharm.D., BCPS []  Georgina Pillion, Pharm.D., BCPS []  [place], [place].D., BCPS, AAHIVP []  Estella Husk, Pharm.D., BCPS, AAHIVP []  Lysle Pearl, PharmD, BCPS []  Phillips Climes, PharmD, BCPS []  Agapito Games, PharmD, BCPS []  Verlan Friends, PharmD []  Mervyn Gay, PharmD, BCPS []  Vinnie Level, PharmD  [facility] []  Len Childs, PharmD []  Greer Pickerel, PharmD []  Adalberto Cole, PharmD []  Perlie Gold, Rph []  Lonell Face) Jean Rosenthal, PharmD []  Earl Many, PharmD []  Junita Push, PharmD []  Dorna Leitz, PharmD []  Terrilee Files, PharmD []  Lynann Beaver, PharmD []  Keturah Barre, PharmD []  Loralee Pacas, PharmD []  Bernadene Person, PharmD   Positive urine culture Treated with Cephalexin, organism sensitive to the same and no further patient follow-up is required at this time.  [facility] 10/03/2022, 9:04 AM

## 2022-10-13 ENCOUNTER — Ambulatory Visit: Payer: Medicaid Other | Admitting: Behavioral Health

## 2022-10-13 DIAGNOSIS — F411 Generalized anxiety disorder: Secondary | ICD-10-CM
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
## 2022-10-13 NOTE — Progress Notes (Signed)
                Victoria L Winstead, LMFT 

## 2022-10-27 ENCOUNTER — Ambulatory Visit (INDEPENDENT_AMBULATORY_CARE_PROVIDER_SITE_OTHER): Payer: Medicaid Other | Admitting: Behavioral Health

## 2022-10-27 DIAGNOSIS — F411 Generalized anxiety disorder: Secondary | ICD-10-CM | POA: Diagnosis not present

## 2022-10-27 NOTE — Progress Notes (Signed)
                Angie L Winstead, LMFT 

## 2022-10-27 NOTE — Progress Notes (Signed)
[facility] Counselor/Therapist Progress Note  Patient ID: Angie Brown, MRN: [id number],    Date: 10/27/2022  Time Spent: 55 min Caregility video; Pt is home in private & Provider is working remotely from [employer]. Pt is aware of the risks/limitations of telehealth & consents to Tx today.  Time In: 1:00pm Time Out: 1:55pm   Treatment Type: Individual Therapy  Reported Symptoms: Elevated anx/dep & stress due to caregiving her 11yo Niece Angie Brown  Mental Status Exam: Appearance:  Casual     Behavior: Appropriate and Sharing  Motor: Normal  Speech/Language:  Clear and Coherent  Affect: Appropriate  Mood: normal  Thought process: normal  Thought content:   WNL  Sensory/Perceptual disturbances:   WNL  Orientation: oriented to person, place, time/date, and situation  Attention: Good  Concentration: Good  Memory: WNL  Fund of knowledge:  Good  Insight:   Good  Judgment:  Good  Impulse Control: Good   Risk Assessment: Danger to Self:  No Self-injurious Behavior: No Danger to Others: No Duty to Warn:no Physical Aggression / Violence:No  Access to Firearms a concern: No  Gang Involvement:No   Subjective: Pt is upbeat today & discussing her Family Hx. Her Nephew Angie Brown is living in her home. She is managing her Dtr Angie Brown, the 11yo & her Nephew. She is trying to help the Adults in the home be responsible. 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  Her Labs w/[facility] were very healthy.   Relationship w/new man named Angie Brown is going naturally. Small things are bothering her about him.   Interventions: Family Systems  Diagnosis:Generalized anxiety disorder  Plan: Angie Brown will cont to be realistic w/her current female interest. She is allowing small things to bother her. She will accept that this new relationship will take time & she is good enough for anyone! She needs time to trust &  time to explore who this man is & if she wants to be w/him on a LT basis.  Target Date: 11/17/2022  Progress: 4  Frequency: Once every 3-4 wks  Modality: Angie Brown She will explore a means to secure affirmations daily.   Target Date: 11/17/2022  Progress: 0  Frequency: Once every 3-4 wks  Modality: Angie Fraise, LMFT

## 2022-11-03 ENCOUNTER — Other Ambulatory Visit: Payer: Self-pay | Admitting: Gastroenterology

## 2022-11-09 ENCOUNTER — Ambulatory Visit (INDEPENDENT_AMBULATORY_CARE_PROVIDER_SITE_OTHER): Payer: Medicaid Other | Admitting: Behavioral Health

## 2022-11-09 DIAGNOSIS — F411 Generalized anxiety disorder: Secondary | ICD-10-CM

## 2022-11-09 NOTE — Progress Notes (Signed)
                Victoria L Winstead, LMFT 

## 2022-11-09 NOTE — Progress Notes (Signed)
[facility] Counselor/Therapist Progress Note  Patient ID: Angie Brown, MRN: [id number],    Date: 11/09/2022  Time Spent: 55 min Caregility video; Pt is home in private & Provider working remotely from [employer]. Pt is aware of the risks/limitations of telehealth & consents to Tx today.  Time In: 3:00pm Time Out: 3:55pm    Treatment Type: Individual Therapy  Reported Symptoms: Elevated anx/dep & tired for past few days. She is nauseous.  Mental Status Exam: Appearance:  Casual     Behavior: Appropriate and Sharing  Motor: Normal  Speech/Language:  Clear and Coherent  Affect: Appropriate  Mood: normal  Thought process: normal  Thought content:   WNL  Sensory/Perceptual disturbances:   WNL  Orientation: oriented to person, place, time/date, and situation  Attention: Good  Concentration: WNL  Memory: WNL  Fund of knowledge:  Good  Insight:   Fair  Judgment:  Good  Impulse Control: Good   Risk Assessment: Danger to Self:  No Self-injurious Behavior: No Danger to Others: No Duty to Warn:no Physical Aggression / Violence:No  Access to Firearms a concern: No  Gang Involvement:No   Subjective: Pt is excited for the new medication she is going to initiate soon via Dr. Chiquita Loth, MD w/[facility].    Interventions: Solution-Oriented/Positive Psychology and Family Systems  Diagnosis:Generalized anxiety disorder  Plan: Angie Brown is dealing w/multiple health status changes that are creating anxiety. Her visit to the Dentist yesterday lft her in a panic attack. She will be aware of this going into future appts w/the Dentist. She will advocate for herself w/future visits.  Target Date: 12/03/2022  Progress: 4  Frequency: Once every 2-3 wks  Modality: [name], LMFT

## 2022-12-01 ENCOUNTER — Ambulatory Visit
Admission: EM | Admit: 2022-12-01 | Discharge: 2022-12-01 | Disposition: A | Discharge status: Home / Self Care | Payer: Medicaid Other

## 2022-12-01 ENCOUNTER — Emergency Department
Admission: EM | Admit: 2022-12-01 | Discharge: 2022-12-01 | Disposition: A | Discharge status: Home / Self Care | Payer: Medicaid Other | Attending: Emergency Medicine | Admitting: Emergency Medicine

## 2022-12-01 ENCOUNTER — Emergency Department: Payer: Medicaid Other

## 2022-12-01 ENCOUNTER — Other Ambulatory Visit: Payer: Self-pay

## 2022-12-01 DIAGNOSIS — E119 Type 2 diabetes mellitus without complications: Secondary | ICD-10-CM | POA: Insufficient documentation

## 2022-12-01 DIAGNOSIS — I10 Essential (primary) hypertension: Secondary | ICD-10-CM | POA: Diagnosis not present

## 2022-12-01 DIAGNOSIS — E039 Hypothyroidism, unspecified: Secondary | ICD-10-CM | POA: Insufficient documentation

## 2022-12-01 DIAGNOSIS — M25511 Pain in right shoulder: Secondary | ICD-10-CM | POA: Diagnosis not present

## 2022-12-01 DIAGNOSIS — X501XXA Overexertion from prolonged static or awkward postures, initial encounter: Secondary | ICD-10-CM | POA: Diagnosis not present

## 2022-12-01 MED ORDER — OXYCODONE-ACETAMINOPHEN 5-325 MG PO TABS: 1.0000 | ORAL_TABLET | ORAL | Status: DC | PRN

## 2022-12-01 MED ORDER — TIZANIDINE HCL 2 MG PO TABS
2.0000 mg | ORAL_TABLET | Freq: Once | ORAL | Status: AC
  Administered 2022-12-01: 2 mg via ORAL
  Filled 2022-12-01: qty 1

## 2022-12-01 MED ORDER — KETOROLAC TROMETHAMINE 10 MG PO TABS
10.0000 mg | ORAL_TABLET | Freq: Three times a day (TID) | ORAL | 0 refills | Status: DC
Start: 2022-12-01 — End: 2024-01-29

## 2022-12-01 MED ORDER — KETOROLAC TROMETHAMINE 30 MG/ML IJ SOLN: 30.0000 mg | Freq: Once | INTRAMUSCULAR | Status: DC

## 2022-12-01 MED ORDER — KETOROLAC TROMETHAMINE 30 MG/ML IJ SOLN
30.0000 mg | Freq: Once | INTRAMUSCULAR | Status: AC
  Administered 2022-12-01: 30 mg via INTRAMUSCULAR
  Filled 2022-12-01: qty 1

## 2022-12-01 NOTE — Discharge Instructions (Addendum)
Your exam and x-ray did not show any signs of an acute fracture, dislocation, or rotator cuff tear.  He may be experiencing some tendinitis in the shoulder.  Take the prescribed Toradol daily as usual.  Take your muscle relaxant up to 3 times a day as needed.  Follow-up with [facility] as discussed.

## 2022-12-01 NOTE — ED Notes (Signed)
Pt verbalizes understanding of discharge instructions.

## 2022-12-01 NOTE — ED Provider Notes (Signed)
[facility] Emergency Department Provider Note     Event Date/Time   First MD Initiated Contact with Patient 12/01/22 2234     (approximate)   History   Arm Pain   HPI  Angie Brown is a 42 y.o. female with a history of hypothyroidism, depression, diabetes, hypertension, obesity, presents to the ED for acute on chronic right shoulder pain.  Patient would endorse 3 months of right arm pain at the shoulder, but reports symptoms drastically worsened today without provocation, injury, trauma, fall.  Patient initially presented to a local urgent care, and was advised to report to ED for further evaluation testing.  Patient would endorse limited range of motion.  No history of chronic or ongoing shoulder pain being followed by any providers.  She did endorse a "pop" to the shoulder about a month prior.  She currently takes Toradol daily for chronic back pain.  She presents to the ED for evaluation of her symptoms.     Physical Exam   Triage Vital Signs: ED Triage Vitals  Encounter Vitals Group     BP 12/01/22 2040 (!) 113/96     Systolic BP Percentile --      Diastolic BP Percentile --      Pulse Rate 12/01/22 2040 (!) 104     Resp 12/01/22 2040 (!) 21     Temp 12/01/22 2040 98 F (36.7 C)     Temp Source 12/01/22 2040 Oral     SpO2 12/01/22 2040 98 %     Weight 12/01/22 2038 177 lb (80.3 kg)     Height 12/01/22 2038 5\' 7"  (1.702 m)     Head Circumference --      Peak Flow --      Pain Score 12/01/22 2038 10     Pain Loc --      Pain Education --      Exclude from Growth Chart --     Most recent vital signs: Vitals:   12/01/22 2257 12/01/22 2325  BP: 127/89 (!) 110/97  Pulse: 99 89  Resp:  16  Temp:    SpO2:  100%    General Awake, no distress. NAD HEENT NCAT. PERRL. EOMI. No rhinorrhea. Mucous membranes are moist.  CV:  Good peripheral perfusion.  RESP:  Normal effort.  ABD:  No distention.  MSK:  Left shoulder without obvious  deformity, dislocation, or sulcus sign.  Patient with active but limited motion.  Normal composite fist distally.  No evidence of any rotator cuff deficit.   ED Results / Procedures / Treatments   Labs (all labs ordered are listed, but only abnormal results are displayed) Labs Reviewed - No data to display   EKG  RADIOLOGY   I personally viewed and evaluated these images as part of my medical decision making, as well as reviewing the written report by the radiologist.  ED Provider Interpretation: No acute findings  DG Shoulder Right  Result Date: 12/01/2022 CLINICAL DATA:  Right arm and shoulder pain EXAM: RIGHT SHOULDER - 2+ VIEW COMPARISON:  None Available. FINDINGS: There is no evidence of fracture or dislocation. Mild glenohumeral and AC joint arthritis. Soft tissues are unremarkable. IMPRESSION: No acute fracture or dislocation. Electronically Signed   By: Minerva Fester M.D.   On: 12/01/2022 22:11     PROCEDURES:  Critical Care performed: No  Procedures   MEDICATIONS ORDERED IN ED: Medications  ketorolac (TORADOL) 30 MG/ML injection 30 mg (30 mg Intramuscular  Given 12/01/22 2321)  tiZANidine (ZANAFLEX) tablet 2 mg (2 mg Oral Given 12/01/22 2321)     IMPRESSION / MDM / ASSESSMENT AND PLAN / ED COURSE  I reviewed the triage vital signs and the nursing notes.                              Differential diagnosis includes, but is not limited to, shoulder strain, shoulder tendinitis, shoulder fracture, shoulder dislocation  Patient's presentation is most consistent with acute complicated illness / injury requiring diagnostic workup.  Patient's diagnosis is consistent with acute shoulder pain of unclear etiology. Patient will be discharged home with prescriptions for Toradol. Patient is to follow up with [facility] as discussed as needed or otherwise directed. Patient is given ED precautions to return to the ED for any worsening or new symptoms.     FINAL CLINICAL  IMPRESSION(S) / ED DIAGNOSES   Final diagnoses:  Acute pain of right shoulder     Rx / DC Orders   ED Discharge Orders          Ordered    ketorolac (TORADOL) 10 MG tablet  Every 8 hours       Note to Pharmacy: IV/IM DOSE GIVEN IN THE ED   12/01/22 2316             Note:  This document was prepared using Dragon voice recognition software and may include unintentional dictation errors.    Lissa Hoard, PA-C 12/03/22 4098    Phineas Semen, MD 12/03/22 [phone]

## 2022-12-01 NOTE — ED Notes (Signed)
Patient remains in room contacting family members/friends to "find ride" to ED as instructed.  B. Roten CMA

## 2022-12-01 NOTE — ED Notes (Signed)
No Toradol, Per provider. B. Roten CMA

## 2022-12-01 NOTE — ED Notes (Signed)
Patient talking to provider regarding "ride" to ED as advised.

## 2022-12-01 NOTE — ED Notes (Signed)
Patient is being discharged from the Urgent Care and sent to the Emergency Department via Private Vehicle (Family) . Per Provider Guy Sandifer), patient is in need of higher level of care due to Complexity of Care. Patient is aware and verbalizes understanding of plan of care.  Vitals:   12/01/22 1816 12/01/22 1819  BP: 98/70   Pulse: (!) 108 (!) 104  Resp: 18   Temp: 98 F (36.7 C)   SpO2: 97%

## 2022-12-01 NOTE — ED Triage Notes (Signed)
"  I am here for Right shoulder pain". "I am stubborn, this started about 3 mos ago, changed the mattress thinking that is the problem. I take Toradol for my back anyway, but now I am starting to feel break through pain on it in this right shoulder again". Nothing known about Right Shoulder "no fracture, no bursitis known". No known RA. No injury to site/shoulder.

## 2022-12-01 NOTE — ED Triage Notes (Signed)
R arm pain. Ongoing for 3 months but states drastically worse today. Pt reports was seen at UC earlier today and was told to come to ED for "higher level of care and testing." Very limited ROM noted. Radial pulse palpable. Pt denies known trauma or injury but states that she did feel a "pop" 1 month ago. Pt has chronic back pain/problems. Pt ambulatory to triage. Alert and oriented. Visibly uncomfortable and tearful. Pt speaking in full sentences. Symmetric chest rise and fall noted. No imaging done at [facility].

## 2022-12-07 ENCOUNTER — Ambulatory Visit (INDEPENDENT_AMBULATORY_CARE_PROVIDER_SITE_OTHER): Payer: Medicaid Other | Admitting: Behavioral Health

## 2022-12-07 DIAGNOSIS — F411 Generalized anxiety disorder: Secondary | ICD-10-CM | POA: Diagnosis not present

## 2022-12-07 NOTE — ED Provider Notes (Addendum)
[facility]    CSN: [id number] Arrival date & time: 12/01/22  1800      History   Chief Complaint Chief Complaint  Patient presents with   Shoulder Pain    HPI Angie Brown is a 42 y.o. female.   Patient here today for evaluation of right shoulder pain that started about 3 months ago.  She thought initially it was due to his mattress but states that this has not improved symptoms.  She reports pain is severe.  She denies any known injury.  She states she does take Toradol for her back pain but this has not completely cleared shoulder symptoms- she is having breakthrough pain.  The history is provided by the patient.  Shoulder Pain Associated symptoms: no fever     Past Medical History:  Diagnosis Date   Allergy    Wasps   Asthma    Bronchitis    Depression    Dysrhythmia    PVCs- stress related   Eczema    Family history of adverse reaction to anesthesia    Hypoglycemia    Hypothyroidism    PONV (postoperative nausea and vomiting)    pt prefers scop patch   Renal disorder    2008    Patient Active Problem List   Diagnosis Date Noted   BMI 30.0-30.9,adult 07/26/2022   Major depressive disorder, recurrent, mild (HCC) 05/30/2022   Nonalcoholic fatty liver disease 03/08/2022   Anal fissure 03/08/2022   Chronic vomiting 03/08/2022   Abdominal cramping 03/08/2022   Functional nausea 03/08/2022   Type 2 diabetes mellitus with hyperglycemia, without long-term current use of insulin (HCC) 10/04/2021   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   Chronic liver disease 08/14/2021   Fatigue 07/01/2021   Mental health problem 07/01/2021   Rash and nonspecific skin eruption 07/01/2021   Weight gain finding 07/01/2021   Routine physical examination 05/20/2021   Adverse food reaction 12/16/2020   Reactive airway disease without complication 12/16/2020   Hymenoptera allergy 12/16/2020   Chronic rhinitis 12/16/2020   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    Constipation 08/20/2020   Unintentional weight loss 08/20/2020   RUQ pain 08/20/2020   Bright red blood per rectum 08/20/2020   External hemorrhoids 08/20/2020   Moderate episode of recurrent major depressive disorder (HCC) 05/14/2020   BRBPR (bright red blood per rectum) 12/22/2019   Nausea without vomiting 12/22/2019   Hemorrhoids 12/22/2019   Folate deficiency 09/09/2019   Chronic fatigue 09/09/2019   Pain disorder with related psychological factors 02/05/2019   Change in bowel function 01/12/2019   Fatty liver 01/12/2019   Gastritis and gastroduodenitis 01/12/2019   Elevated LFTs 01/12/2019   Abdominal pain, right upper quadrant 01/12/2019   Nausea and vomiting    NASH (nonalcoholic steatohepatitis)    Hepatitis 12/03/2018   Transaminitis 12/03/2018   Anaphylactic reaction to wasp sting 10/12/2018   Displacement of lumbar intervertebral disc without myelopathy 01/14/2018   Essential hypertension 10/24/2017   Mixed hyperlipidemia 07/16/2017   Abdominal pain 06/08/2017   Chronic constipation 02/14/2017   Anaphylaxis 09/29/2016   Elevated glucose 09/29/2016   Chronic pain 06/29/2016   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   Voiding dysfunction 03/08/2016   Other insomnia 03/01/2016   Elevated blood pressure reading 01/05/2016   GAD (generalized anxiety disorder) 01/05/2016   Gastroesophageal reflux disease without esophagitis 01/05/2016   Recurrent major depressive disorder (HCC) 01/05/2016   Generalized anxiety disorder 11/02/2014   Stress incontinence in female 03/11/2014   Disorder  of sacroiliac joint 07/23/2013   HYPOTENSION 03/18/2010   ARM PAIN 04/28/2009   ABNORMAL INVOLUNTARY MOVEMENTS 04/28/2009   FACIAL PARESTHESIA 04/28/2009   HEADACHE 04/26/2009   WHEEZING 03/23/2009   DIARRHEA, ACUTE, CHRONIC 02/19/2009   HYDRONEPHROSIS, RIGHT 02/18/2009   PYELONEPHRITIS, HX OF 02/18/2009   MIGRAINES, HX OF 02/18/2009   LUMBAR RADICULOPATHY, RIGHT  12/23/2008   Acute cystitis without hematuria 01/22/2008   BACK PAIN 01/22/2008   ELEVATED BLOOD PRESSURE WITHOUT DIAGNOSIS OF HYPERTENSION 01/22/2008   OBESITY, MORBID 05/29/2007   INSOMNIA-SLEEP DISORDER-UNSPEC 05/29/2007   HYPERSOMNIA 05/29/2007   ANEMIA-NOS 01/01/2007   ANXIETY 01/01/2007   DEPRESSION 01/01/2007   COMMON MIGRAINE 01/01/2007   ASTHMA 01/01/2007   SYNCOPE 01/01/2007   Asthma 01/01/2007   Migraine without aura and without status migrainosus, not intractable 01/01/2007    Past Surgical History:  Procedure Laterality Date   ABDOMINAL HYSTERECTOMY     ADENOIDECTOMY     BACK SURGERY  07/2009   spinal fusion L 5-S1   BIOPSY  12/07/2018   Procedure: BIOPSY;  Surgeon: Lemar Lofty., MD;  Location: [facility];  Service: Gastroenterology;;   BIOPSY  10/25/2020   Procedure: BIOPSY;  Surgeon: Lemar Lofty., MD;  Location: [facility];  Service: Gastroenterology;;   BLADDER SUSPENSION N/A 03/11/2014   Procedure: TRANSVAGINAL TAPE (TVT) PROCEDURE;  Surgeon: Lavina Hamman, MD;  Location: [facility];  Service: Gynecology;  Laterality: N/A;   CHOLECYSTECTOMY     ESOPHAGOGASTRODUODENOSCOPY (EGD) WITH PROPOFOL N/A 12/07/2018   Procedure: ESOPHAGOGASTRODUODENOSCOPY (EGD) WITH PROPOFOL;  Surgeon: Meridee Score Netty Starring., MD;  Location: [facility];  Service: Gastroenterology;  Laterality: N/A;   ESOPHAGOGASTRODUODENOSCOPY (EGD) WITH PROPOFOL N/A 10/25/2020   Procedure: ESOPHAGOGASTRODUODENOSCOPY (EGD) WITH PROPOFOL;  Surgeon: Meridee Score Netty Starring., MD;  Location: [facility];  Service: Gastroenterology;  Laterality: N/A;   TONSILLECTOMY AND ADENOIDECTOMY     TYMPANOSTOMY TUBE PLACEMENT     UPPER ESOPHAGEAL ENDOSCOPIC ULTRASOUND (EUS) N/A 10/25/2020   Procedure: UPPER ESOPHAGEAL ENDOSCOPIC ULTRASOUND (EUS);  Surgeon: Lemar Lofty., MD;  Location: [facility];  Service: Gastroenterology;  Laterality: N/A;    OB History   No obstetric history on  file.      Home Medications    Prior to Admission medications   Medication Sig Start Date End Date Taking? Authorizing Provider  busPIRone (BUSPAR) 10 MG tablet Take 10 mg by mouth as directed. 07/11/22  Yes [provider]  cyclobenzaprine (FLEXERIL) 5 MG tablet Take 5 mg by mouth every 8 (eight) hours as needed. 10/31/22  Yes [provider]  Dexlansoprazole 30 MG capsule DR Take 30 mg by mouth as needed.   Yes [provider]  dicyclomine (BENTYL) 10 MG capsule Take 10 mg by mouth as needed for spasms. 09/01/21  Yes [provider]  dronabinol (MARINOL) 2.5 MG capsule Take 1 capsule (2.5 mg total) by mouth 3 (three) times daily. 08/01/22  Yes Mansouraty, Netty Starring., MD  dronabinol (MARINOL) 2.5 MG capsule Take 2.5 mg by mouth as needed. 08/12/21  Yes [provider]  escitalopram (LEXAPRO) 10 MG tablet Take 10 mg by mouth daily.   Yes [provider]  Eszopiclone 3 MG TABS Take 3 mg by mouth at bedtime. 03/07/22  Yes [provider]  ketorolac (TORADOL) 10 MG tablet Take 10 mg by mouth daily as needed.   Yes [provider]  levocetirizine (XYZAL) 5 MG tablet Take 1 tablet by mouth every evening. 02/01/22  Yes [provider]  losartan (COZAAR)  100 MG tablet Take 100 mg by mouth daily.   Yes [provider]  moxifloxacin (VIGAMOX) 0.5 % ophthalmic solution Place 1 drop into the right eye as directed. 10/20/22  Yes [provider]  mupirocin ointment (BACTROBAN) 2 % Apply 1 Application topically 2 (two) times daily. 04/04/22  Yes [provider]  nirmatrelvir & ritonavir (PAXLOVID) 20 x 150 MG & 10 x 100MG  TBPK Take 1 tablet by mouth as directed. Take 300 mg nirmatrelvir (two 150 mg tablets) with 100 mg ritonavir (one 100 mg tablet) by mouth twice daily for 5 days. 11/28/21  Yes [provider]  OZEMPIC, 1 MG/DOSE, 4 MG/3ML SOPN Inject 1 mg into the skin once a week.   Yes [provider]  promethazine (PHENERGAN) 25 MG tablet TAKE 1 TABLET BY MOUTH EVERY 6 HOURS AS NEEDED. 11/03/22  Yes Mansouraty, Netty Starring., MD  REZDIFFRA 80 MG TABS Take 1 tablet by mouth daily.   Yes [provider]  rizatriptan (MAXALT-MLT) 10 MG disintegrating tablet Take 10 mg by mouth as needed for migraine. 10/25/22  Yes [provider]  rosuvastatin (CRESTOR) 10 MG tablet Take 10 mg by mouth daily. 11/09/22  Yes [provider]  rosuvastatin (CRESTOR) 10 MG tablet Take 10 mg by mouth daily.   Yes [provider]  Semaglutide, 1 MG/DOSE, (OZEMPIC, 1 MG/DOSE,) 4 MG/3ML SOPN Inject 1 mg as directed every 7 days. 07/26/22  Yes [provider]  Vilazodone HCl (VIIBRYD) 10 MG TABS Take 10 mg by mouth daily. 03/07/22  Yes [provider]  Vitamin D, Ergocalciferol, (DRISDOL) 1.25 MG (50000 UNIT) CAPS capsule Take 50,000 Units by mouth once a week. 09/09/19  Yes [provider]  Vitamin D, Ergocalciferol, (DRISDOL) 1.25 MG (50000 UNIT) CAPS capsule Take 1 capsule by mouth once a week. 10/14/21 10/30/23 Yes [provider]  albuterol (PROVENTIL) (2.5 MG/3ML) 0.083% nebulizer solution Take 2.5 mg by nebulization every 6 (six) hours as needed for wheezing or shortness of breath.  04/03/19   [provider]  albuterol (VENTOLIN HFA) 108 (90 Base) MCG/ACT inhaler Inhale 1-2 puffs into the lungs every 6 (six) hours as needed for wheezing or shortness of breath. 04/01/19   Hall-Potvin, [place], PA-C  AMBULATORY NON FORMULARY MEDICATION Medication Name: Diltiazem 2%/ Lidocaine 5% Apply pea size amount 1/2 to 1 inch inside rectum 3-4 times daily 03/08/22   Mansouraty, Netty Starring., MD  Dexlansoprazole 30 MG capsule DR TAKE 1 CAPSULE (30 MG TOTAL) BY MOUTH DAILY. 07/27/22   Mansouraty, Netty Starring., MD  dicyclomine (BENTYL) 10 MG capsule TAKE 1 CAPSULE BY MOUTH 4 TIMES DAILY, BEFORE MEALS AND AT BEDTIME. 03/08/22   Mansouraty, Netty Starring., MD   diphenhydrAMINE (BENADRYL) 25 mg capsule Take 25 mg by mouth at bedtime as needed for allergies.     [provider]  EPINEPHrine 0.3 mg/0.3 mL IJ SOAJ injection Inject 0.3 mg into the muscle as needed for anaphylaxis. 12/16/20   Ellamae Sia, DO  EPINEPHrine 0.3 mg/0.3 mL IJ SOAJ injection Inject 0.3 mg into the muscle as needed for anaphylaxis. 08/25/21   [provider]  fluticasone (FLONASE) 50 MCG/ACT nasal spray Place 1 spray into both nostrils 2 (two) times daily as needed (nasal congestion). 12/16/20   Ellamae Sia, DO  folic acid (FOLVITE) 1 MG tablet Take 1 mg by mouth daily. 09/09/19   [provider]  hydrOXYzine (VISTARIL) 25 MG capsule Take 25 mg by mouth 3 (three) times  daily as needed for anxiety.    [provider]  ketorolac (TORADOL) 10 MG tablet Take 1 tablet (10 mg total) by mouth every 8 (eight) hours. 12/01/22   Menshew, Charlesetta Ivory, PA-C  levocetirizine (XYZAL) 5 MG tablet TAKE 1 TABLET BY MOUTH EVERY EVENING. 07/25/21   Ellamae Sia, DO  levothyroxine (SYNTHROID) 50 MCG tablet Take 50 mcg by mouth daily before breakfast.    [provider]  linaclotide (LINZESS) 290 MCG CAPS capsule TAKE 1 CAPSULE (290 MCG TOTAL) BY MOUTH DAILY BEFORE BREAKFAST. 03/08/22   Mansouraty, Netty Starring., MD  linaclotide (LINZESS) 290 MCG CAPS capsule Take 290 mcg by mouth daily before breakfast.    [provider]  Melatonin 10 MG CAPS Take 1 capsule by mouth as needed.    [provider]  montelukast (SINGULAIR) 10 MG tablet Take 1 tablet (10 mg total) by mouth at bedtime. 12/16/20   Ellamae Sia, DO  rizatriptan (MAXALT-MLT) 10 MG disintegrating tablet DISSOLVE 1 TABLET ON THE TONGUE AS NEEDED FOR MIGRAINE. MAY REPEAT IN 2 HOURS IF NEEDED. 01/14/21   [provider]  sucralfate (CARAFATE) 1 GM/10ML suspension Take 10 mLs (1 g total) by mouth 2 (two) times daily. 10/25/20 10/25/21  Mansouraty, Netty Starring., MD    Family History Family  History  Problem Relation Age of Onset   Asthma Mother    Allergic rhinitis Mother    Liver disease Mother        Fatty liver, NASH   GI Bleed Mother    Hypothyroidism Mother    Allergic rhinitis Father    Hypothyroidism Sister    Liver disease Sister        fatty liver   Asthma Sister    Allergic rhinitis Sister    Hypothyroidism Sister    Liver disease Sister        fatty liver   Thyroid cancer Sister    Liver cancer Maternal Grandfather    Liver disease Maternal Grandfather        fatty liver   Colon cancer Neg Hx    Esophageal cancer Neg Hx    Rectal cancer Neg Hx    Inflammatory bowel disease Neg Hx    Pancreatic cancer Neg Hx    Stomach cancer Neg Hx     Social History Social History   Tobacco Use   Smoking status: Former    Current packs/day: 0.00    Types: Cigarettes    Quit date: 08/2021    Years since quitting: 1.3   Smokeless tobacco: Never  Vaping Use   Vaping status: Former   Substances: Nicotine, Flavoring  Substance Use Topics   Alcohol use: Not Currently   Drug use: No     Allergies   Loratadine, Wasp venom, Ibuprofen, Latex, Sumatriptan, Hepatitis a vaccine, Doxycycline, Fentanyl, Methocarbamol, Tape, and Wound dressing adhesive   Review of Systems Review of Systems  Constitutional:  Negative for chills and fever.  Eyes:  Negative for discharge and redness.  Respiratory:  Negative for shortness of breath.   Gastrointestinal:  Negative for nausea and vomiting.  Musculoskeletal:  Positive for arthralgias.  Neurological:  Negative for numbness.     Physical Exam Triage Vital Signs ED Triage Vitals  Encounter Vitals Group     BP 12/01/22 1816 98/70     Systolic BP Percentile --      Diastolic BP Percentile --      Pulse Rate 12/01/22 1816 (!) 108  Resp 12/01/22 1816 18     Temp 12/01/22 1816 98 F (36.7 C)     Temp Source 12/01/22 1816 Oral     SpO2 12/01/22 1816 97 %     Weight 12/01/22 1811 177 lb (80.3 kg)     Height  12/01/22 1811 5\' 7"  (1.702 m)     Head Circumference --      Peak Flow --      Pain Score 12/01/22 1808 10     Pain Loc --      Pain Education --      Exclude from Growth Chart --    No data found.  Updated Vital Signs BP 98/70 (BP Location: Left Arm)   Pulse (!) 104   Temp 98 F (36.7 C) (Oral)   Resp 18   Ht 5\' 7"  (1.702 m)   Wt 177 lb (80.3 kg)   SpO2 97%   BMI 27.72 kg/m      Physical Exam Vitals and nursing note reviewed.  Constitutional:      General: She is in acute distress (patient appears to be uncomfortable- holding right arm/ shoulder).     Appearance: She is not ill-appearing.  HENT:     Head: Normocephalic and atraumatic.  Eyes:     Conjunctiva/sclera: Conjunctivae normal.  Cardiovascular:     Rate and Rhythm: Normal rate.  Pulmonary:     Effort: Pulmonary effort is normal. No respiratory distress.  Musculoskeletal:     Comments: Pain out of portion to exam.  Patient unable to raise right arm above shoulder height and with any movement grabs her right arm and bends over shrieking in significant pain.  Neurological:     Mental Status: She is alert.  Psychiatric:        Mood and Affect: Mood normal.        Behavior: Behavior normal.        Thought Content: Thought content normal.      UC Treatments / Results  Labs (all labs ordered are listed, but only abnormal results are displayed) Labs Reviewed - No data to display  EKG   Radiology No results found.  Procedures Procedures (including critical care time)  Medications Ordered in UC Medications - No data to display  Initial Impression / Assessment and Plan / UC Course  I have reviewed the triage vital signs and the nursing notes.  Pertinent labs & imaging results that were available during my care of the patient were reviewed by me and considered in my medical decision making (see chart for details).    Initially had suggested Toradol injection however this given significance of pain  and pain obviously out of portion to exam --recommended further evaluation in the emergency room.  Patient expresses understanding and will have family member transport her from urgent care to ED.  Final Clinical Impressions(s) / UC Diagnoses   Final diagnoses:  Acute pain of right shoulder   Discharge Instructions   None    ED Prescriptions   None    PDMP not reviewed this encounter.   Tomi Bamberger, PA-C 12/07/22 2036    Tomi Bamberger, PA-C 12/07/22 2038

## 2022-12-07 NOTE — Progress Notes (Signed)
                Anastasya Jewell L Farryn Linares, LMFT 

## 2022-12-07 NOTE — Progress Notes (Signed)
[facility] Counselor/Therapist Progress Note  Patient ID: Angie Brown, MRN: [id number],    Date: 12/07/2022  Time Spent: 55 min Caregility video; Pt is home in private & Provider working remotely from [employer]. Pt is aware of the risks/limitations of telehealth & consents to Tx today.  Time In: 4:00pm Time Out:4:55pm  Treatment Type: Individual Therapy  Reported Symptoms: Elevated anx/dep & stressors due to health status changes  Mental Status Exam: Appearance:  Casual     Behavior: Appropriate and Sharing  Motor: Normal  Speech/Language:  Clear and Coherent  Affect: Appropriate  Mood: normal  Thought process: normal  Thought content:   WNL  Sensory/Perceptual disturbances:   WNL  Orientation: oriented to person, place, time/date, and situation  Attention: Good  Concentration: Good  Memory: WNL  Fund of knowledge:  Good  Insight:   Good  Judgment:  Good  Impulse Control: Good   Risk Assessment: Danger to Self:  No Self-injurious Behavior: No Danger to Others: No Duty to Warn:no Physical Aggression / Violence:No  Access to Firearms a concern: No  Gang Involvement:No   Subjective: Pt has recently exp'd frozen shoulder & it was difficult to get Tx @ an UC. She ended up getting a Toridol inj, a musc relaxer, & an Ortho Referral. It is better now.  Dtr Angie Brown will join session later today. She is back on Ritalin & has to eat when she is hungry.  Angie Brown has lost a second job & this has gotten on her nerves. He is visiting less. Angie Brown is totally out of the picture now. She has resolved the issues w/him that have kept her tied to him.   She rec'd a phone call from a Step Str of Angie Brown reporting his odd beh & was texting her 43yo to request a sleepover. Now Pt is worried for her own Dtr a/or Sons; if they have ever been approached by this Individual in the past. Pt's Dtr denied any inappropriate touching or events w/this man.    She has initiated Rezdiffra for her  Liver. This new med is just off Clinical Trial. She started this in early Sept. She is exp'g s/e profile: nausea, vomiting, diarrhea, & weight loss. The first week was most intense & has improved since. The goal is to prevent Transplant Listing. Her plate of concerns for her health has inc'd.  Interventions: Family Systems  Diagnosis:Generalized anxiety disorder  Plan: Angie Brown is trying to deal w/her Liver issues & now a frozen shoulder. It is hard to dress, & this has been frustrating. She is managing her relationships & keeping solid boundaries. She is putting her health & her personal issues first. 15 min w/Dtr Angie Brown today to ease into a full session next visit to discuss Caregiving.  Target Date: 01/02/2023  Progress: 5  Frequency: Once every 2-3 wks  Modality: [name], LMFT

## 2022-12-21 ENCOUNTER — Ambulatory Visit (INDEPENDENT_AMBULATORY_CARE_PROVIDER_SITE_OTHER): Payer: Medicaid Other | Admitting: Behavioral Health

## 2022-12-21 DIAGNOSIS — F411 Generalized anxiety disorder: Secondary | ICD-10-CM | POA: Diagnosis not present

## 2022-12-21 NOTE — Progress Notes (Signed)
                Anastasya Jewell L Farryn Linares, LMFT 

## 2022-12-21 NOTE — Progress Notes (Signed)
[facility] Counselor/Therapist Progress Note  Patient ID: Angie Brown, MRN: [id number],    Date: 12/21/2022  Time Spent: 55 min Caregility video; Pt is home in private & Provider is working remotely from [employer]. Pt is aware of the risks/limitations of telehealth & consents to Tx today. Time In: 4:00pm Time Out: 4:55pm   Treatment Type: Individual Therapy  Reported Symptoms: Elevated anx/dep & stress due to health issues, being basically home-bound, & trying to cope w/in her Px limitations.  Mental Status Exam: Appearance:  Casual     Behavior: Appropriate and Sharing  Motor: Normal  Speech/Language:  Clear and Coherent  Affect: Full Range  Mood: labile  Thought process: normal  Thought content:   WNL  Sensory/Perceptual disturbances:   WNL  Orientation: oriented to person, place, time/date, and situation  Attention: Good  Concentration: Good  Memory: WNL  Fund of knowledge:  Good  Insight:   Fair  Judgment:  Good  Impulse Control: Good   Risk Assessment: Danger to Self:  No Self-injurious Behavior: No Danger to Others: No Duty to Warn:no Physical Aggression / Violence:No  Access to Firearms a concern: No  Gang Involvement:No   Subjective: Pt has recently rec'd a joint inj that has not relieved her discomfort. Her Tx has been delayed due to her Ins with [facility]. She is frustrated. 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    Pt has exp'd a nightmare re: her Son serving in [place]. She dreamt about blood coming from his head-this was distressing.   Pt is having distress over the loss of use of her L arm; it is requiring she need assistance from best friend Missy & her Str for ADL. Her pain has been present since the end of June & she can do little about it until the end of Nov.   Pt describes living w/her Dtr [name] & how the communication btwn them has suffered due to Dtr  needing to take on some of the Caregiver Role for Pt.   Interventions: Family Systems  Diagnosis:Generalized anxiety disorder  Plan: Next visit, Pt requests further discussion w/her Dtr in the session so they can improve their communication further.   Target Date: 01/17/2023  Progress: 5  Frequency: Once every 2-3 wks  Modality: [name], LMFT

## 2023-01-03 ENCOUNTER — Ambulatory Visit: Payer: Medicaid Other | Admitting: Behavioral Health

## 2023-01-03 DIAGNOSIS — F411 Generalized anxiety disorder: Secondary | ICD-10-CM | POA: Diagnosis not present

## 2023-01-03 NOTE — Progress Notes (Signed)
                Anastasya Jewell L Farryn Linares, LMFT 

## 2023-01-03 NOTE — Progress Notes (Signed)
[facility] Counselor/Therapist Progress Note  Patient ID: Angie Brown, MRN: [id number],    Date: 01/03/2023  Time Spent: 55 min Caregility video; Pt is home in privated & Provider remote from [facility] Time In: 4:00pm Time Out: 4:55pm   Treatment Type: Individual Therapy  Reported Symptoms: Pt is off steroids & is limiting her use of ingestible gummies & THC smoking. Pt is back to her baseline beh.   Mental Status Exam: Appearance:  Casual     Behavior: Appropriate and Sharing  Motor: Normal  Speech/Language:  Clear and Coherent  Affect: Appropriate  Mood: normal  Thought process: normal  Thought content:   WNL  Sensory/Perceptual disturbances:   WNL  Orientation: oriented to person, place, time/date, and situation  Attention: Good  Concentration: Good  Memory: WNL  Fund of knowledge:  Good  Insight:   Good  Judgment:  Good  Impulse Control: Good   Risk Assessment: Danger to Self:  No Self-injurious Behavior: No Danger to Others: No Duty to Warn:no Physical Aggression / Violence:No  Access to Firearms a concern: No  Gang Involvement:No   Subjective: Pt is off her steroids now & has stopped her use of THC to smoke or ingest as gummies. Pt is trying to prevent her use of any medication that is not absolutely necessary. She is maintaining her attn on her Kidney health.   Pt saw her Str & she washes her hair wkly. This is giving her time away from her home & getting out w/some Family social time.   Interventions: Solution-Oriented/Positive Psychology and Family Systems  Diagnosis:Generalized anxiety disorder  Plan: Nathalie is being prudent w/all her medications. She is concerned for her health changes & wants to feel her best. She is finding things funny again & joking w/her Str Danielle & Dtr [name]. She is feeling more freedom lately from past Hx of BF whom she wants no connection. She is finding positive ways to have steady boundaries & this is  helping her feel more stability.  Target Date: 02/02/2023  Progress: 6  Frequency: Once every 2-3 wks  Modality: [name], LMFT

## 2023-01-18 ENCOUNTER — Ambulatory Visit: Payer: Medicaid Other | Admitting: Behavioral Health

## 2023-01-18 DIAGNOSIS — F411 Generalized anxiety disorder: Secondary | ICD-10-CM

## 2023-01-18 NOTE — Progress Notes (Signed)
[facility] Counselor/Therapist Progress Note  Patient ID: Angie Brown, MRN: [id number],    Date: 01/18/2023  Time Spent: 55 min Caregility video; Pt is home in private & Provider is working from [employer]. Pt is aware of the risks/limitations of telehealth today & consent so Tx today. Time In: 2:00pm Time Out: 2:55pm   Treatment Type: Individual Therapy  Reported Symptoms: Elevated anx/dep & stress due to Px cond in the present moment  Mental Status Exam: Appearance:  Casual     Behavior: Appropriate and Sharing  Motor: Normal  Speech/Language:  Clear and Coherent  Affect: Appropriate  Mood: normal  Thought process: normal  Thought content:   WNLs  Sensory/Perceptual disturbances:   WNL  Orientation: oriented to person, place, time/date, and situation  Attention: Good  Concentration: Good  Memory: WNL  Fund of knowledge:  Good  Insight:   Fair  Judgment:  Fair  Impulse Control: Fair   Risk Assessment: Danger to Self:  No Self-injurious Behavior: No Danger to Others: No Duty to Warn:no Physical Aggression / Violence:No  Access to Firearms a concern: No  Gang Involvement:No   Subjective: Pt is exp'g shoulder pain & is gaining no result from her Orthopedic Surgeons & Specialists.    Interventions: Family Systems  Diagnosis:Generalized anxiety disorder  Plan: Pt will f/u with her PCP about a SW Referral for her chronic pain.  Target Date: 02/17/2023  Progress:5  Frequency: Once every 2-3 wks  Modality: [name], LMFT

## 2023-01-18 NOTE — Progress Notes (Signed)
                Anastasya Jewell L Farryn Linares, LMFT 

## 2023-02-08 ENCOUNTER — Ambulatory Visit: Payer: Medicaid Other | Admitting: Behavioral Health

## 2023-02-08 DIAGNOSIS — F411 Generalized anxiety disorder: Secondary | ICD-10-CM

## 2023-02-08 NOTE — Progress Notes (Signed)
[facility] Counselor/Therapist Progress Note  Patient ID: KYMM SOCK, MRN: [id number],    Date: 02/08/2023  Time Spent: 55 min Caregility video; Pt is home in private & Provider working remotely from [employer]. Pt is aware of the risks/limitations of telehealth & consents to Tx today.  Time In: 2:00pm Time Out: 2:55pm   Treatment Type: Individual Therapy  Reported Symptoms: Elevated anx/dep & stress due to recent issues w/a new female friend who has entered her life.   Mental Status Exam: Appearance:  Casual     Behavior: Appropriate and Sharing  Motor: Normal  Speech/Language:  Clear and Coherent  Affect: Appropriate  Mood: normal  Thought process: normal  Thought content:   WNL  Sensory/Perceptual disturbances:   WNL  Orientation: oriented to person, place, time/date, and situation  Attention: Good  Concentration: Good  Memory: WNL  Fund of knowledge:  Good  Insight:   Fair  Judgment:  Good  Impulse Control: Fair   Risk Assessment: Danger to Self:  No Self-injurious Behavior: No Danger to Others: No Duty to Warn:no Physical Aggression / Violence:No  Access to Firearms a concern: No  Gang Involvement:No   Subjective: Pt has initiated a relationship w/a female she met online & they have dated twice, once @ his home for dinner. He has a 43yo Dtr named [employer]. They are on the same page about children & dating. This man is connected to his family by acquaintance.    Interventions: Family Systems  Diagnosis:Generalized anxiety disorder  Plan: Elham has connected w/a female friend who is truly real. She met him online & he has been respectful & caring for her. She is questioning this relationship although it has been going well. We discussed the aspects of a solid relationship that are essential for [facility]. She will take notes on this until our next session & share these to process.  Target Date: 03/19/2023  Progress: 5  Frequency: Once every 2-3  wks  Modality: [name], LMFT

## 2023-02-08 NOTE — Progress Notes (Signed)
                Anastasya Jewell L Farryn Linares, LMFT 

## 2023-02-12 ENCOUNTER — Telehealth: Payer: Self-pay

## 2023-02-12 DIAGNOSIS — K760 Fatty (change of) liver, not elsewhere classified: Secondary | ICD-10-CM

## 2023-02-12 NOTE — Telephone Encounter (Signed)
-----   Message from Nurse Tayler Lassen P sent at 08/11/2022 11:19 AM EDT ----- 6 month U/S with elastography.

## 2023-02-12 NOTE — Telephone Encounter (Signed)
[place] elastography order has been entered  Left message on machine to call back

## 2023-02-13 NOTE — Telephone Encounter (Signed)
Attempted to reach pt and voice mail not set up. Will try again later

## 2023-02-13 NOTE — Telephone Encounter (Signed)
PT returning call. Please advise.

## 2023-02-14 NOTE — Telephone Encounter (Signed)
Left message on machine to call back  

## 2023-02-19 NOTE — Telephone Encounter (Signed)
The pt has been advised and agrees to the [place] order.  I have sent to the schedulers

## 2023-02-22 ENCOUNTER — Ambulatory Visit (INDEPENDENT_AMBULATORY_CARE_PROVIDER_SITE_OTHER): Payer: Medicaid Other | Admitting: Behavioral Health

## 2023-02-22 DIAGNOSIS — F411 Generalized anxiety disorder: Secondary | ICD-10-CM | POA: Diagnosis not present

## 2023-02-22 NOTE — Progress Notes (Signed)
[facility] Counselor/Therapist Progress Note  Patient ID: Angie Brown, MRN: [id number],    Date: 03/05/2023  Time Spent: 55 min Caregility video; Pt is home in private & Provider working remotely from [employer]. Pt is aware of the risks/limitations of telehealth & consents to Tx today.  Time In: 4:00pm Time Out: 4:55pm   Treatment Type: Individual Therapy  Reported Symptoms: Elevated anx/dep & stress due to the Holidays & Family dynamics.  Mental Status Exam: Appearance:  Casual     Behavior: Appropriate and Sharing  Motor: Normal  Speech/Language:  Clear and Coherent  Affect: Appropriate  Mood: anxious  Thought process: normal  Thought content:   WNL  Sensory/Perceptual disturbances:   WNL  Orientation: oriented to person, place, time/date, and situation  Attention: Good  Concentration: Good  Memory: WNL  Fund of knowledge:  Good  Insight:   Fair  Judgment:  Fair  Impulse Control: Fair   Risk Assessment: Danger to Self:  No Self-injurious Behavior: No Danger to Others: No Duty to Warn:no Physical Aggression / Violence:No  Access to Firearms a concern: No  Gang Involvement:No   Subjective: Pt is upbeat today & trying to process the Thanksgiving Holiday w/her Family & the dynamics she witnesses. One of her Sons has texted her & this is the first communication in months. She is excited for this having a more long term meaning w/addt'l contact. She is keeping her expectations realistic.She feels Thanksgiving was somewhat "ruined" bc she was impulsive in talking to her children & this made for difficulties.   Pt is dealing w/a fair amount of pain from her shoulder that is making sleep difficult. She is taking Gabapentin before bed & this helps somewhat. She is consulting w/a pain Specialist in the near future to manage her pain med needs.   Interventions: Family Systems  Diagnosis:Generalized anxiety disorder  Plan: Angie Brown has mixed emotions today  about the Holiday Season. She is missing her Sons & does not speak to them much. Her Dtr Angie Brown lives w/her & is very helpful & kind. When she speaks to a Pain Med Specialist, she wants to detox off her Klonipin. This will be a process. She will keep in mind all the suggestions offered today invl'g detox & prevent any harm to herself by fllw'g the protocol offered when she has a Consult.  Target Date: 03/19/2023 Progress: 6  Frequency: Once every 2-3 wks   Modality: [name], LMFT

## 2023-02-22 NOTE — Progress Notes (Signed)
                Anastasya Jewell L Farryn Linares, LMFT 

## 2023-03-01 ENCOUNTER — Ambulatory Visit (HOSPITAL_COMMUNITY)
Admission: RE | Admit: 2023-03-01 | Discharge: 2023-03-01 | Disposition: A | Discharge status: Home / Self Care | Payer: Medicaid Other | Source: Ambulatory Visit | Attending: Gastroenterology | Admitting: Gastroenterology

## 2023-03-01 DIAGNOSIS — K760 Fatty (change of) liver, not elsewhere classified: Secondary | ICD-10-CM | POA: Diagnosis present

## 2023-03-05 NOTE — Addendum Note (Signed)
Addended by: Deneise Lever on: 03/05/2023 07:05 AM   Modules accepted: Level of Service

## 2023-03-13 ENCOUNTER — Ambulatory Visit: Payer: Medicaid Other | Admitting: Behavioral Health

## 2023-03-13 DIAGNOSIS — F411 Generalized anxiety disorder: Secondary | ICD-10-CM

## 2023-03-13 NOTE — Progress Notes (Signed)
[facility] Counselor/Therapist Progress Note  Patient ID: Angie Brown, MRN: [id number],    Date: 03/13/2023  Time Spent: 55 min Caregility video; Pt @ home in private & Provider working remotely from [employer]. Pt is aware of the risks/limitations of telehealth & consents to Tx today. Time In: 1:00pm Time Out: 1:55pm   Treatment Type: Individual Therapy  Reported Symptoms: Reduction in anx/dep & stress  Mental Status Exam: Appearance:  Casual     Behavior: Appropriate and Sharing  Motor: Normal  Speech/Language:  Clear and Coherent  Affect: Appropriate  Mood: normal  Thought process: normal  Thought content:   WNL  Sensory/Perceptual disturbances:   WNL  Orientation: oriented to person, place, time/date, and situation  Attention: Good  Concentration: Good  Memory: WNL  Fund of knowledge:  Good  Insight:   Good  Judgment:  Good  Impulse Control: Good   Risk Assessment: Danger to Self:  No Self-injurious Behavior: No Danger to Others: No Duty to Warn:no Physical Aggression / Violence:No  Access to Firearms a concern: No  Gang Involvement:No   Subjective: Pt has come to realizations about her 3 Children. Youngest Son [name] called her & he came to the [facility] Christmas Gathering. He came w/his (43yo) GF Layla. They had a wonderful Christmas tgthr. Her Dtr was also @ the Gathering. Oldest Son [name] [phone]) is still in [place] until Feb 2025. 43yo Dtr Sebastian Ache is working @ [employer]. She has come to a new understanding about her kids & all she gave them. A weight has lifted from her heart.   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 She is reassured by some decisions she has made today. She is being accountable for her own actions/decisions in the past. She is examining her Rx & ETOH use in the past & sees the contribution this has made in her health She realizes & will  cont to explore her past to make more determinations about her future health.   Target Date: 04/19/2023  Progress: 7  Frequency: Once every 2-3 wks  Modality: [name], LMFT

## 2023-03-13 NOTE — Progress Notes (Signed)
                Angie Brown Farryn Linares, LMFT 

## 2023-03-26 ENCOUNTER — Other Ambulatory Visit: Payer: Self-pay | Admitting: Gastroenterology

## 2023-03-27 MED ORDER — PROMETHAZINE HCL 25 MG PO TABS: 25.0000 mg | ORAL_TABLET | Freq: Four times a day (QID) | ORAL | 0 refills | Status: DC | PRN

## 2023-04-05 ENCOUNTER — Ambulatory Visit: Payer: Medicaid Other | Admitting: Behavioral Health

## 2023-04-05 DIAGNOSIS — F411 Generalized anxiety disorder: Secondary | ICD-10-CM

## 2023-04-05 NOTE — Progress Notes (Signed)
                Anastasya Jewell L Farryn Linares, LMFT 

## 2023-04-05 NOTE — Progress Notes (Signed)
 [facility] Counselor/Therapist Progress Note  Patient ID: Angie Brown, MRN: [id number],    Date: 04/05/2023  Time Spent: 55 min Caregility video; Pt is home in private & Provider is working remotely from [employer]. Pt is aware of the risks/limitations of telehealth & consents to Tx today. Time In: 3:00pm Time Out: 3:55pm   Treatment Type: Individual Therapy  Reported Symptoms: Reduction in anx/dep & stress since reconnection w/Son Angie Brown & trying to determine her role w/him.   Mental Status Exam: Appearance:  Casual     Behavior: Appropriate and Sharing  Motor: Normal  Speech/Language:  Clear and Coherent  Affect: Appropriate  Mood: normal  Thought process: normal  Thought content:   WNL  Sensory/Perceptual disturbances:   WNL  Orientation: oriented to person, place, time/date, and situation  Attention: Good  Concentration: Good  Memory: WNL  Fund of knowledge:  Good  Insight:   Good  Judgment:  Good  Impulse Control: Good   Risk Assessment: Danger to Self:  No Self-injurious Behavior: No Danger to Others: No Duty to Warn:no Physical Aggression / Violence:No  Access to Firearms a concern: No  Gang Involvement:No   Subjective: Pt is trying to adjust to her Children returning into her life & needing her in different ways as Adult Children. She wants to play a part in their lives & knows she made mistakes in the past. Her Son Angie Brown is giving her the grace of a mature young man. She is examining her past being realistic. She wants to be a Parent, but a wiser one.   Her Dtr Angie Brown is 44yo & acting like a Teen. It is hard to live w/her & not direct her constantly.   Pt feels more confident about her Parenting Style as her Son questions his Father's Parenting Style. She has learned to deal w/her anxiety more adeptly.   Her Mom died 10 yrs ago. She has operated in fight/flight mode ever since. Now, she is recognizing things more clearly & trying to function  differently to cope better.   Interventions: Family Systems  Diagnosis:Generalized anxiety disorder  Plan: Angie Brown is looking back & feeling differently about raising her children. She made wise decisions for her children & sacrificed her own desires to raise them herself. She has made many realizations about raising her children. It has brought validation for her efforts over the past 8+ yrs. She will cont to explore & advance her understanding of her Px health & mental health to function @ optimal levels.   Target Date: 04/21/2023  Progress: 7  Frequency: Once every 2-3 wks  Modality: Angie Fraise, LMFT

## 2023-04-19 ENCOUNTER — Ambulatory Visit: Payer: Medicaid Other | Admitting: Behavioral Health

## 2023-04-19 DIAGNOSIS — F411 Generalized anxiety disorder: Secondary | ICD-10-CM

## 2023-04-19 NOTE — Progress Notes (Signed)
Deneise Lever, LMFT

## 2023-04-19 NOTE — Progress Notes (Signed)
[facility] Counselor/Therapist Progress Note  Patient ID: Angie Brown, MRN: [id number],    Date: 04/19/2023  Time Spent: 55 min Caregility video; Pt is home in private & Provider working remotely in [employer]. Pt is aware of the risks/limitations of telehealth & consents to Tx today.  Time In: 3:00pm  Time Out: 3:55pm  Treatment Type: Individual Therapy  Reported Symptoms: Elevated anx/dep & stress  Mental Status Exam: Appearance:  Casual     Behavior: Appropriate and Sharing  Motor: Normal  Speech/Language:  Clear and Coherent  Affect: Appropriate  Mood: normal  Thought process: normal  Thought content:   WNL  Sensory/Perceptual disturbances:   WNL  Orientation: oriented to person, place, time/date, and situation  Attention: Good  Concentration: Good  Memory: WNL  Fund of knowledge:  Good  Insight:   Good  Judgment:  Good  Impulse Control: Good   Risk Assessment: Danger to Self:  No Self-injurious Behavior: No Danger to Others: No Duty to Warn:no Physical Aggression / Violence:No  Access to Firearms a concern: No  Gang Involvement:No   Subjective: Pt is having an exchange w/her Dtr Angie Brown. She is trying to help a friend w/a new home. Her Dtr has been @ home today & w/her BF. He is being inappropriate by wearing no shirt & in his underwear. Pt's Dtr has BF named Angie Brown & they have been dating a year. He does not have responsibilities.  Pt is reviewing her past w/her Ex-Husb who was always a consistent Provider for the Family. She has noticed the difference in the generations. This BF is lax & doesn't care properly for her Dtr. Dtr & BF went out to celebrate her Bday & got drunk. This displeased her highly.   Pt is trying to manage her sugars, watch her T2DM, & is using a Glucose Monitor to assist her in control of the diabetes. It alarms for her to eat.    Interventions: Family Systems  Diagnosis:Generalized anxiety disorder  Plan: Angie Brown will  monitor more closely to manage her T2DM. She needs complex carbs to keep her levels up through the night. She is trying to get more strict. 70 is low & 60 is critical. She eats a good brkfst daily & this jump starts her system. She will do more research on her sugar levels & uses the Log on the [employer].   Target Date: 2/30/2025  Progress: 6  Frequency: Once every 2-3 wks  Modality: [name], LMFT

## 2023-04-22 ENCOUNTER — Other Ambulatory Visit: Payer: Self-pay | Admitting: Gastroenterology

## 2023-04-23 ENCOUNTER — Other Ambulatory Visit: Payer: Self-pay | Admitting: Gastroenterology

## 2023-04-23 MED ORDER — PROMETHAZINE HCL 25 MG PO TABS: 25.0000 mg | ORAL_TABLET | Freq: Four times a day (QID) | ORAL | 0 refills | Status: DC | PRN

## 2023-04-25 IMAGING — CT CT ABD-PELV W/O CM
2 of 4 series · 16 of 46 positions shown, 18 images · non-contrast
Comparison: Ultrasound abdomen 07/30/2020, CT abdomen pelvis
09/15/2019

CLINICAL DATA: Nonlocalized acute abdominal pain. Right upper
quadrant pain and tenderness.

EXAM:
CT ABDOMEN AND PELVIS WITHOUT CONTRAST
TECHNIQUE: Multidetector CT imaging of the abdomen and pelvis was performed
following the standard protocol without IV contrast.

[Series 2: axial st · axial · 0.91mm/px · z∈[-555,-115]mm · 13 of 100 slices shown, 15 images]
[im 6/100  soft-tissue]
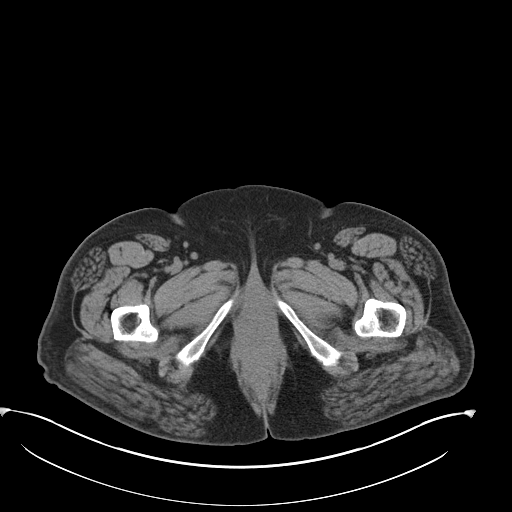
[im 6/100  bone]
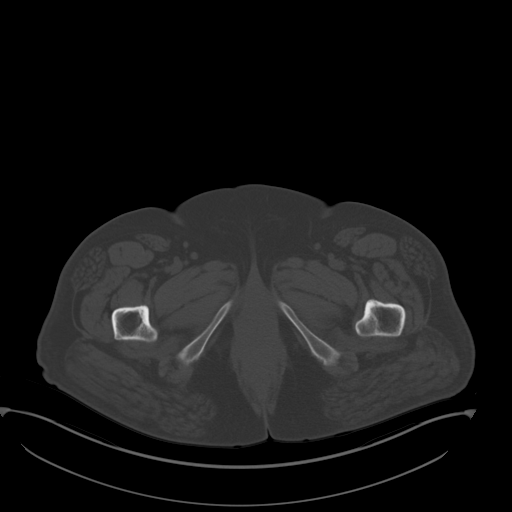
[im 12/100  soft-tissue]
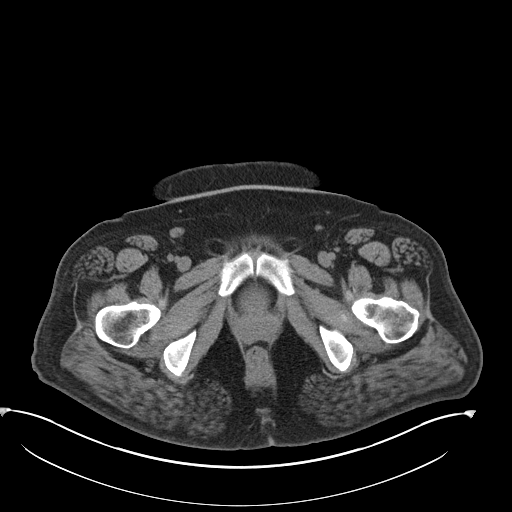
[im 23/100  soft-tissue]
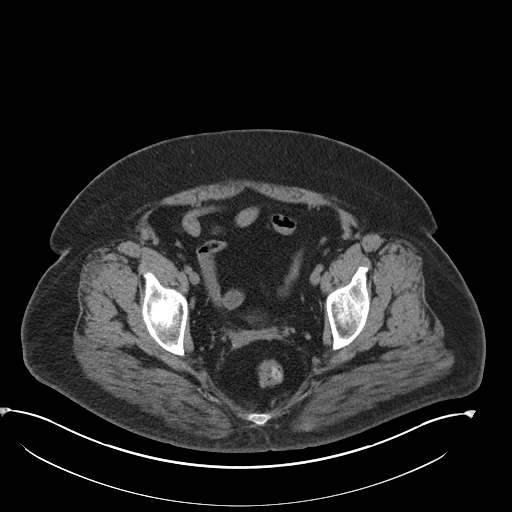
[im 28/100  soft-tissue]
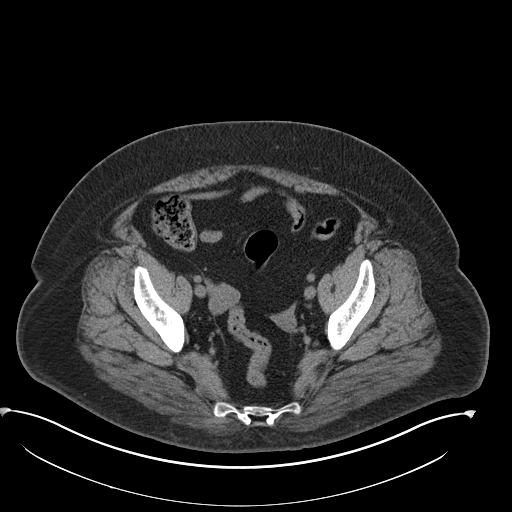
[im 34/100  soft-tissue]
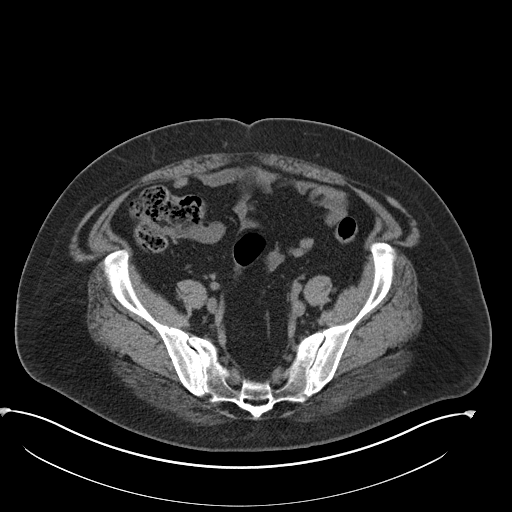
[im 45/100  soft-tissue]
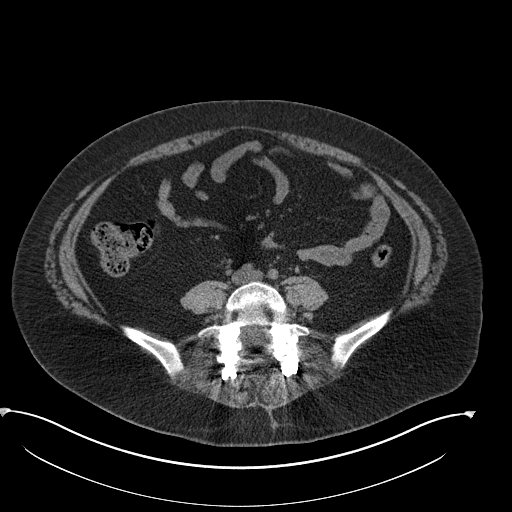
[im 50/100  soft-tissue]
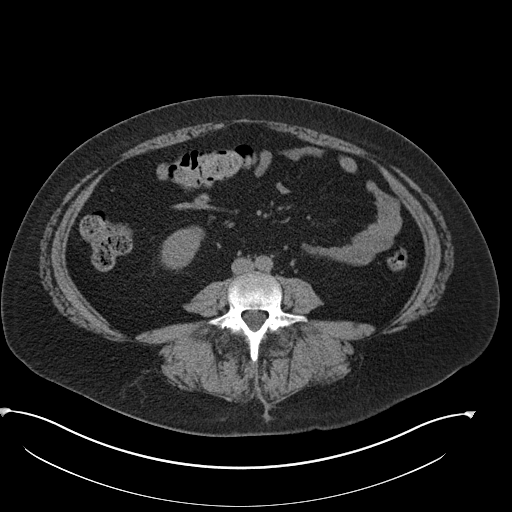
[im 56/100  soft-tissue]
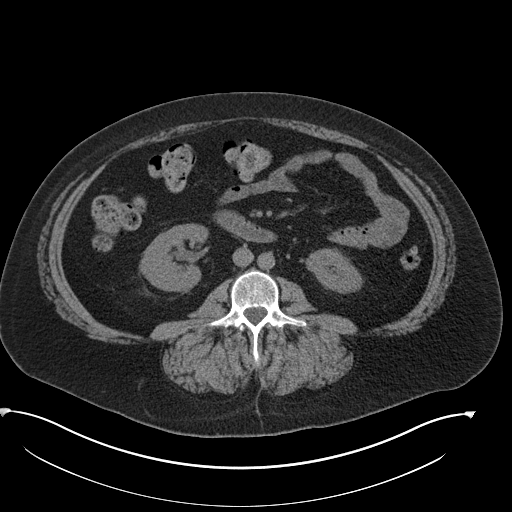
[im 67/100  soft-tissue]
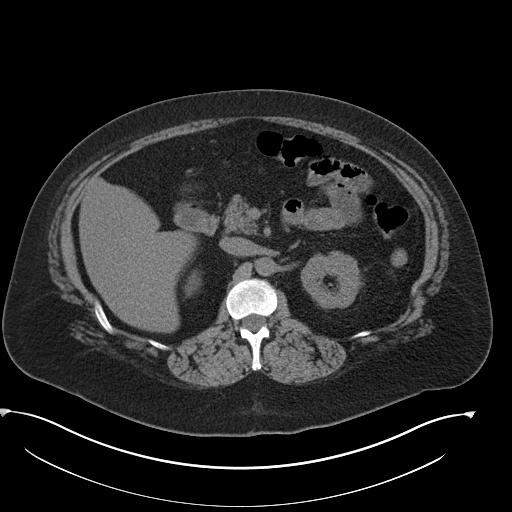
[im 67/100  bone]
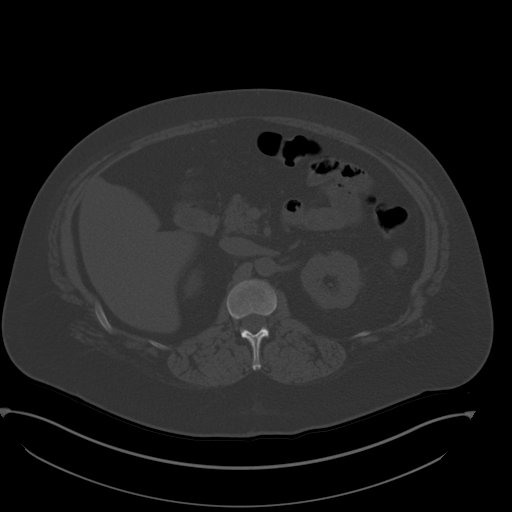
[im 72/100  soft-tissue]
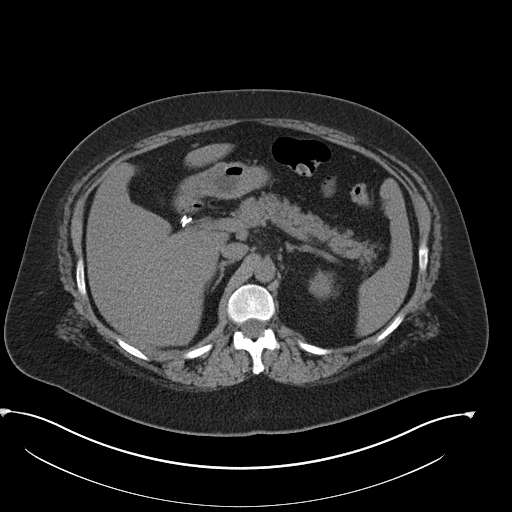
[im 78/100  soft-tissue]
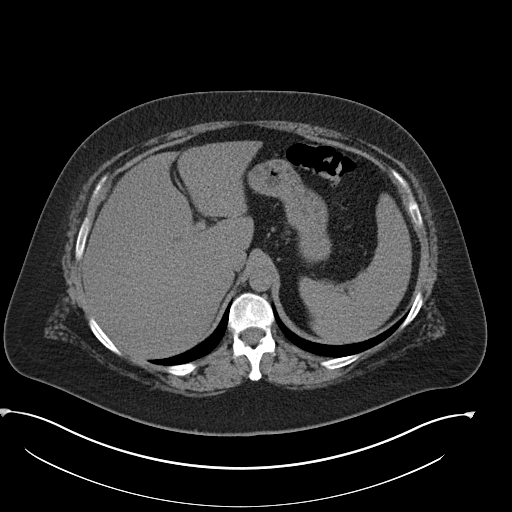
[im 89/100  soft-tissue]
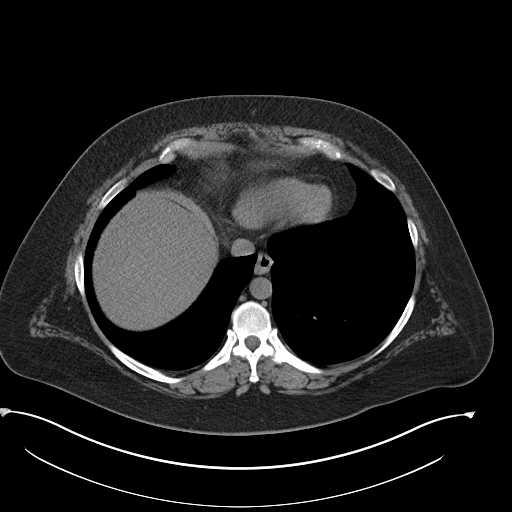
[im 94/100  soft-tissue]
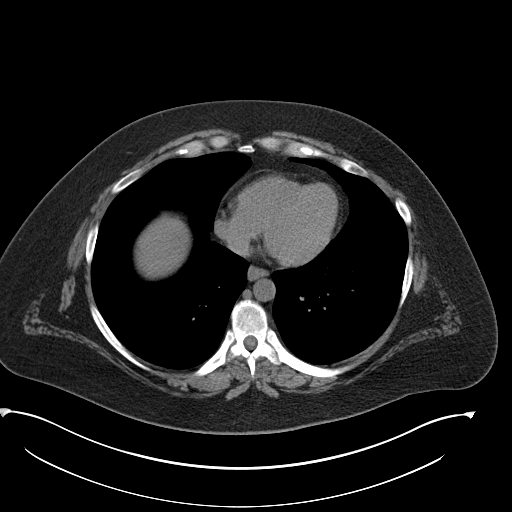

[Series 5: coronal · coronal · 0.90mm/px · 3 of 166 slices shown]
[im 56/166  soft-tissue]
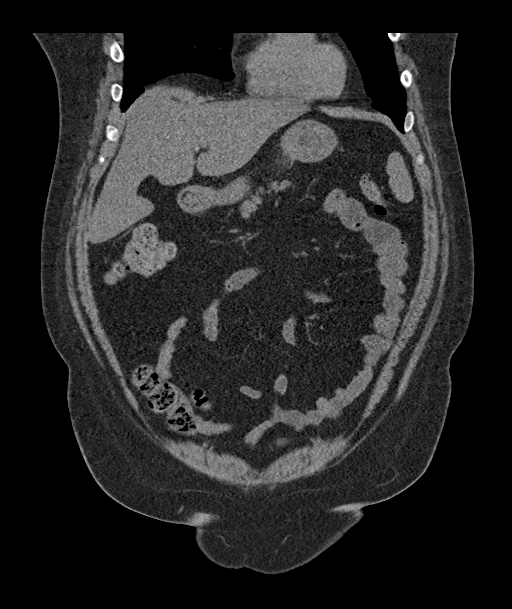
[im 74/166  soft-tissue]
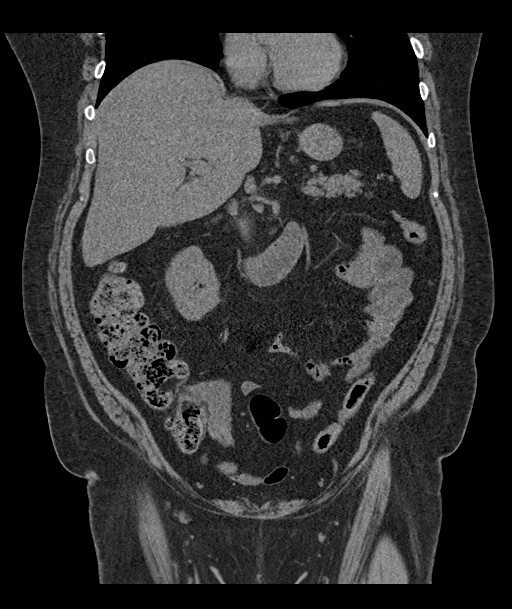
[im 92/166  soft-tissue]
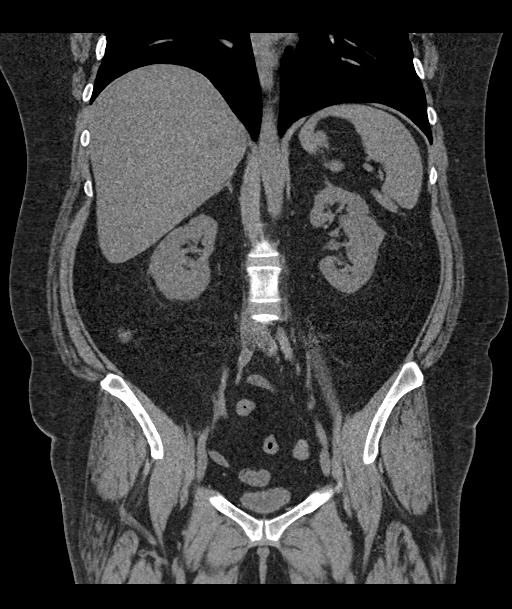

[16 of 46 positions shown; findings below may reference images not displayed]

FINDINGS: Lower chest: Ultrasound abdomen 07/30/2020

Hepatobiliary: The liver is enlarged measuring up to 19 cm. No focal
liver abnormality is seen. Status post cholecystectomy. No biliary
dilatation.

Pancreas: No focal lesion. Normal pancreatic contour. No surrounding
inflammatory changes. No main pancreatic ductal dilatation.

Spleen: Normal in size without focal abnormality. A splenule is
noted ([DATE]).

Adrenals/Urinary Tract:

No adrenal nodule bilaterally.

Left renal cortical scarring. Subcentimeter hypodensity too small to
characterize. No nephrolithiasis, no hydronephrosis, and no
contour-deforming renal mass. No ureterolithiasis or hydroureter.

The urinary bladder is unremarkable.

Stomach/Bowel: Stomach is within normal limits. No evidence of bowel
wall thickening or dilatation. Few scattered colonic diverticula.
Appendix appears normal.

Vascular/Lymphatic: No abdominal aorta or iliac aneurysm. Mild
atherosclerotic plaque of the aorta and its branches. No abdominal,
pelvic, or inguinal lymphadenopathy.

Reproductive: Status post hysterectomy. No adnexal masses. Bilateral
ovaries are grossly unremarkable.

Other: No intraperitoneal free fluid. No intraperitoneal free gas.
No organized fluid collection.

Musculoskeletal:

No abdominal wall hernia or abnormality.

No suspicious lytic or blastic osseous lesions. No acute displaced
fracture. Status post L5-S1 laminectomy and posterolateral fusion
surgical hardware.
IMPRESSION: 1. Hepatomegaly.
2. Few scattered colonic diverticula with no acute diverticulitis.
3. Status post cholecystectomy and hysterectomy.

## 2023-05-04 ENCOUNTER — Ambulatory Visit: Payer: Medicaid Other | Admitting: Behavioral Health

## 2023-05-04 DIAGNOSIS — F411 Generalized anxiety disorder: Secondary | ICD-10-CM

## 2023-05-04 NOTE — Progress Notes (Signed)
[facility] Counselor/Therapist Progress Note  Patient ID: Angie Brown, MRN: [id number],    Date: 05/04/2023  Time Spent: 55 min Caregility video; Pt is @ her Str's home in private & Provider is working remotely from [employer]. Pt is aware of the risks/limitations of telehealth & consents to Tx today. Time In: 11:00am Time Out: 11:55am   Treatment Type: Individual Therapy  Reported Symptoms: Elevated anx/dep & stress due to discrepant glucose readings overnight that are prompting sleep loss.   Mental Status Exam: Appearance:  Casual     Behavior: Appropriate and Sharing  Motor: Normal  Speech/Language:  Clear and Coherent  Affect: Appropriate  Mood: normal  Thought process: normal  Thought content:   WNL  Sensory/Perceptual disturbances:   WNL  Orientation: oriented to person, place, time/date, and situation  Attention: Good  Concentration: Good  Memory: WNL  Fund of knowledge:  Good  Insight:   Good  Judgment:  Good  Impulse Control: Good   Risk Assessment: Danger to Self:  No Self-injurious Behavior: No Danger to Others: No Duty to Warn:no Physical Aggression / Violence:No  Access to Firearms a concern: No  Gang Involvement:No   Subjective: Pt is worried for her Bl sugar levels; she is navigating her readings & trying to moderate her levels. It has been confusing & she has not rec'd the Edu she needs to meet her understanding. She is exp'g fatigue, she has missed PT this week, & she does not feel safe to drive. She is being careful around others & does not go out in public a great deal.   Pt is staying w/her Willeen Niece so they can monitor the situation tgthr.   Son has let Pt know his Dad has given him the "birds & the bees talk" & threw condoms @ him. [name] thought it was funny. He called his Mom & told her. Pt is examining this today.   Interventions: Solution-Oriented/Positive Psychology and  Psycho-education/Bibliotherapy  Diagnosis:Generalized anxiety disorder  Plan: Drina will call her PCP's Office on Monday to address the need for DMT2 Edu. She is speaking w/her Rosamaria Lints, & managing her contact w/Jon. She cont's to text them. She sees her Dtr often. Her Son [name] has trust in her & is speaking w/her whenever he needs guidance. He speaks less w/his Dad whose relationships have been frequent (married 4 times). Her 44yo is getting his Driver's License soon & the Ins will be elevated for a new driver. She will cont w/her current plan to keep to herself safe through the cold season.   Target Date: 06/02/2023  Progress: 7  Frequency: Once every 2-3 wks  Modality: [name], LMFT

## 2023-05-04 NOTE — Progress Notes (Signed)
Deneise Lever, LMFT

## 2023-05-25 ENCOUNTER — Ambulatory Visit: Payer: Medicaid Other | Admitting: Behavioral Health

## 2023-05-25 DIAGNOSIS — F411 Generalized anxiety disorder: Secondary | ICD-10-CM

## 2023-05-25 NOTE — Progress Notes (Signed)
   Deneise Lever, LMFT

## 2023-05-25 NOTE — Progress Notes (Signed)
 [facility] Counselor/Therapist Progress Note  Patient ID: Angie Brown, MRN: [id number],    Date: 05/25/2023  Time Spent: 30 min Caregility video; Pt is home in private & Provider is working remotely from [employer]. Pt is aware of the risks/limitations of telehealth & consents to Tx today. Time In: 12:00pm Time Out: 12:30pm   Treatment Type: Individual Therapy  Reported Symptoms: Pt is exp'g a fractured tooth on the L side of her mouth. She is on Clindamycin QID. She is also taking 81mg  Baby Aspirin.   Mental Status Exam: Appearance:  Casual     Behavior: Appropriate and Sharing  Motor: Normal  Speech/Language:  Clear and Coherent  Affect: Appropriate  Mood: normal  Thought process: normal  Thought content:   WNL  Sensory/Perceptual disturbances:   WNL  Orientation: oriented to person, place, time/date, and situation  Attention: Good  Concentration: Good  Memory: WNL  Fund of knowledge:  Good  Insight:   Good  Judgment:  Good  Impulse Control: Good   Risk Assessment: Danger to Self:  No Self-injurious Behavior: No Danger to Others: No Duty to Warn:no Physical Aggression / Violence:No  Access to Firearms a concern: No  Gang Involvement:No   Subjective: Pt is dealing w/an infection in her mouth where a fractured tooth has caused her pain. She is having it pulled next Thursday. She is on an antibiotic until that time. Today she is resting from the fatigue of constipation & trying to deal w/the state of the house & its un-tidyness.   Interventions: Ego-Supportive and Interpersonal  Diagnosis:Generalized anxiety disorder  Plan: Analyn will cont to rest today & keep herself as healthy as possible until her tooth is pulled. We will cont our work next session.   Target Date: 06/19/2023  Progress: 5  Frequency: Once every 2-3 wks  Modality: [name], LMFT

## 2023-06-12 ENCOUNTER — Ambulatory Visit (INDEPENDENT_AMBULATORY_CARE_PROVIDER_SITE_OTHER): Admitting: Behavioral Health

## 2023-06-12 ENCOUNTER — Other Ambulatory Visit: Payer: Self-pay | Admitting: Gastroenterology

## 2023-06-12 DIAGNOSIS — F411 Generalized anxiety disorder: Secondary | ICD-10-CM | POA: Diagnosis not present

## 2023-06-12 NOTE — Progress Notes (Signed)
 [facility] Counselor/Therapist Progress Note  Patient ID: Angie Brown, MRN: [id number],    Date: 06/12/2023  Time Spent: 55 min Caregility video; Pt is home in private & Provider working remotely from [employer]. Pt is aware of the risks/limitations of telehealth & consents to Tx today.  Time In: 11:00am Time Out: 11:55am   Treatment Type: Individual Therapy  Reported Symptoms: Elevated anx/dep & stress due to conversations w/Son graduating [employer] soon. [name] during his formative years with his Dad.  Mental Status Exam: Appearance:  Casual     Behavior: Appropriate and Sharing  Motor: Normal  Speech/Language:  Clear and Coherent  Affect: Appropriate  Mood: normal  Thought process: normal  Thought content:   WNL  Sensory/Perceptual disturbances:   WNL  Orientation: oriented to person, place, time/date, and situation  Attention: Good  Concentration: Good  Memory: WNL  Fund of knowledge:  Good  Insight:   Good  Judgment:  Good  Impulse Control: Good   Risk Assessment: Danger to Self:  No Self-injurious Behavior: No Danger to Others: No Duty to Warn:no Physical Aggression / Violence:No  Access to Firearms a concern: No  Gang Involvement:No   Subjective: [name] are noticing the past relationship w/their Mom & Dad. Her 2 Sons; Angie Brown & Angie Brown & Dtr Angie Brown have all made reooncilatory steps towards Mom. She is wanting to promote the best for her 3 children. Angie Brown & Angie Brown are much like Mom, dating one person @ a time. Pt does not know Angie Brown as well, but texts him once wkly. She is turning a corner & trying to be attentive to her kids.   Pt is trying not to pay attn to her Ex-Husb's bad beh, even currently. He is on his 4th marriage. She is not allowed on his property.  Since Pt has been sick this past year, she has been more vigilant about the relationships w/her kids.   Interventions: Family Systems  Diagnosis:Generalized anxiety  disorder  Plan: Angie Brown will cont to take note of her beh w/her children. She is celebrating her Bday on Friday w/her Family & the kids. She is looking forward to this time w/Family. Her Father, Step Mother, Sisters, & children will be @ the dinner. She places more value in these relationships than ever before.   Target Date: 07/03/2023  Progress: 7  Frequency: Once every 2-3 wks  Modality: [name], LMFT

## 2023-06-12 NOTE — Progress Notes (Signed)
   Deneise Lever, LMFT

## 2023-06-28 ENCOUNTER — Ambulatory Visit: Admitting: Behavioral Health

## 2023-06-28 DIAGNOSIS — F411 Generalized anxiety disorder: Secondary | ICD-10-CM

## 2023-06-28 NOTE — Progress Notes (Signed)
 [facility] Counselor/Therapist Progress Note  Patient ID: Angie Brown, MRN: [id number],    Date: 06/28/2023  Time Spent: 45 min Caregility video; Pt is home in private & Provider working remotely from [employer]. Pt is aware of the risks/limitations of telehealth & consents to Tx today. Time In: 3:00pm Time Out: 3:45pm   Treatment Type: Individual Therapy  Reported Symptoms: Elevated anx/dep & stress due to Sons & their reconciliation w/her   Mental Status Exam: Appearance:  Casual     Behavior: Appropriate and Sharing  Motor: Normal  Speech/Language:  Clear and Coherent  Affect: Appropriate  Mood: normal  Thought process: normal  Thought content:   WNL  Sensory/Perceptual disturbances:   WNL  Orientation: oriented to person, place, and time/date & situation  Attention: Good  Concentration: Good  Memory: WNL  Fund of knowledge:  Good  Insight:   Good  Judgment:  Good  Impulse Control: Good   Risk Assessment: Danger to Self:  No Self-injurious Behavior: No Danger to Others: No Duty to Warn:no Physical Aggression / Violence:No  Access to Firearms a concern: No  Gang Involvement:No   Subjective: Pt is nauseous today & has an episode of vertigo. Her Son [name] who lives w/his Fr Barbara Cower, wants to move home w/Mom. She is hesitant about this move. Her Son feels reduced privacy & lack of having a life. His Father is highly controlling & making him miserable. [name] has spoken w/his Str [name].  Pt has exp'd a sense of peace for the past 6 yrs. She is having self-doubt about Son [name] eturning to her home. Her children's Father has claimed she refused to sign ppw for [employer] [place] to join [employer] & this constituted abandonment. He has created a story to tell his Son this narrative. Their Father has told the Sons false stories to create negativity against Pt as their Mother.   Interventions: Solution-Oriented/Positive Psychology and Family Systems  Diagnosis:Generalized  anxiety disorder  Plan: Felissa will keep her calm about the current situation. She will not feed into the drama her Str has been speaking. The uncertainty of her Ex-Husb's behavior scares her. She will keep her perspective & not approach her Ex-Husb. She has accepted where the Family situation has settled & she has listened to her Son's reasoning. He wants to move back to [employer] house & make amends.   Target Date: 08/02/2023  Progress: 6  Frequency: Once every 2-3 wks  Modality: [name], LMFT

## 2023-06-28 NOTE — Progress Notes (Signed)
   Angie Lever, LMFT

## 2023-07-17 ENCOUNTER — Other Ambulatory Visit: Payer: Self-pay | Admitting: Gastroenterology

## 2023-07-19 ENCOUNTER — Ambulatory Visit: Admitting: Behavioral Health

## 2023-07-19 DIAGNOSIS — F411 Generalized anxiety disorder: Secondary | ICD-10-CM

## 2023-07-19 NOTE — Progress Notes (Signed)
   Deneise Lever, LMFT

## 2023-07-19 NOTE — Progress Notes (Addendum)
 [facility] Counselor/Therapist Progress Note  Patient ID: MARINA DELCARLO, MRN: [id number],    Date: 07/19/2023  Time Spent: 50 min Caregility video; Pt is home in private & Provider working remotely from [employer]. Pt is aware of the risks/limitations of telehealth & consents to Tx today.  Time In: 2:00pm Time Out: 2:45pm   Treatment Type: Individual Therapy  Reported Symptoms: Elevated anx/dep & stress due to life situation  Mental Status Exam: Appearance:  Casual     Behavior: Appropriate and Sharing  Motor: Normal  Speech/Language:  Clear and Coherent  Affect: Congruent  Mood: anxious  Thought process: normal  Thought content:   WNL  Sensory/Perceptual disturbances:   WNL  Orientation: oriented to person, place, time/date, and situation  Attention: Good  Concentration: Good  Memory: WNL  Fund of knowledge:  Good  Insight:   Good  Judgment:  Good  Impulse Control: Good   Risk Assessment: Danger to Self:  No Self-injurious Behavior: No Danger to Others: No Duty to Warn:no Physical Aggression / Violence:No  Access to Firearms a concern: No  Gang Involvement:No   Subjective: Pt expresses stress today over the past week. Her Son has presented knowledge to her recently that Mom has changed & is different now-outside the marriage. His Father has told her Son Derwood Flor stories about his Mom that are untrue. This has disturbed Pt highly.   Interventions: Insight-Oriented  Diagnosis:Generalized anxiety disorder  Plan: Dashaun will cont to remind herself about the past Hx she has w/her 3 children's Father who has been intent on creating a bad reputation for her. She is trying to deal w/this neg aspect of her former [employer]. She will keep her distance & communication levels non-existence w/her former Husb. She will not let him deter her to give him attn.   Target Date: 08/17/2023  Progress: 6  Frequency: Once 2-3 wks  Modality: Deborrah Fam,  LMFT

## 2023-08-09 ENCOUNTER — Ambulatory Visit (INDEPENDENT_AMBULATORY_CARE_PROVIDER_SITE_OTHER): Admitting: Behavioral Health

## 2023-08-09 DIAGNOSIS — F411 Generalized anxiety disorder: Secondary | ICD-10-CM | POA: Diagnosis not present

## 2023-08-09 NOTE — Progress Notes (Signed)
 [facility] Counselor/Therapist Progress Note  Patient ID: Angie Brown, MRN: [id number],    Date: 08/09/2023  Time Spent: 50 min Caregility video; Pt is home in private & Provider is working remotely from [employer]. Pt is aware of the risks/limitations of telehealth & consents to Tx today. Time In: 2:00pm Time Out: 2:50pm   Treatment Type: Individual Therapy  Reported Symptoms: Pt is fatigued today after not going to sleep until 7:00am.  Mental Status Exam: Appearance:  Casual     Behavior: Appropriate and Sharing  Motor: Normal  Speech/Language:  Clear and Coherent  Affect: Appropriate  Mood: normal  Thought process: normal  Thought content:   WNL  Sensory/Perceptual disturbances:   WNL  Orientation: oriented to person, place, time/date, and situation  Attention: Good  Concentration: Good  Memory: WNL  Fund of knowledge:  Good  Insight:   Good  Judgment:  Good  Impulse Control: Good   Risk Assessment: Danger to Self:  No Self-injurious Behavior: No Danger to Others: No Duty to Warn:no Physical Aggression / Violence:No  Access to Firearms a concern: No  Gang Involvement:No   Subjective: Pt is upset & exhausted today. Her anxiety is getting the best of her since the knowledge her 67yo Son is moving back in w/his Mother this Sunday. She has been cleaning her home & putting it back tgthr since our last visit. This constancy is helping her cope.   [name] has been exceptionally helpful. She is worried for her Son's adjustment & excitement she will be dealing with since her Son gave her the news & they have talked. Pt is scared for her former Husb's beh due to his Hx of 'False Reporting' to DSS. Pt will try to keep her sleep regulated.   Interventions: Solution-Oriented/Positive Psychology and Psycho-education/Bibliotherapy  Diagnosis:Generalized anxiety disorder  Plan: Angie Brown will mind her health, stay on her medications, & be alert & oriented  during the process of moving back into her home. She will keep her expectations realistic & not dramatic. Angie Brown will explore the website of [employer] on Nurse, children's.  Target Date: 09/17/2023  Progress: 6  Frequency: Once every 2-3 wks  Modality: Deborrah Fam, LMFT

## 2023-08-09 NOTE — Progress Notes (Signed)
   Deneise Lever, LMFT

## 2023-08-24 ENCOUNTER — Other Ambulatory Visit: Payer: Self-pay | Admitting: Gastroenterology

## 2023-08-31 ENCOUNTER — Ambulatory Visit: Admitting: Behavioral Health

## 2023-09-03 ENCOUNTER — Ambulatory Visit (INDEPENDENT_AMBULATORY_CARE_PROVIDER_SITE_OTHER): Admitting: Behavioral Health

## 2023-09-03 DIAGNOSIS — F411 Generalized anxiety disorder: Secondary | ICD-10-CM

## 2023-09-03 NOTE — Progress Notes (Unsigned)
   Deneise Lever, LMFT

## 2023-09-03 NOTE — Progress Notes (Unsigned)
 [facility] Counselor/Therapist Progress Note  Patient ID: AOI KOUNS, MRN: [id number],    Date: 09/03/2023  Time Spent: 45 min Caregility video; Pt is home in private & Provider working remotely from [employer]. Pt is aware of the risks/limitations of telehealth & consents to Tx today.  Time In: 3:00pm Time Out: 3:45pm  Treatment Type: Individual Therapy  Reported Symptoms: Elevated anx/dep & stress due to feeling ill  Mental Status Exam: Appearance:  Casual     Behavior: Appropriate and Sharing  Motor: Normal  Speech/Language:  Clear and Coherent  Affect: Appropriate  Mood: normal  Thought process: normal  Thought content:   WNL  Sensory/Perceptual disturbances:   WNL  Orientation: oriented to person, place, time/date, and situation  Attention: Good  Concentration: Good  Memory: WNL  Fund of knowledge:  Good  Insight:   Good  Judgment:  Good  Impulse Control: Good   Risk Assessment: Danger to Self:  No Self-injurious Behavior: No Danger to Others: No Duty to Warn:no Physical Aggression / Violence:No  Access to Firearms a concern: No  Gang Involvement:No   Subjective: Pt is reviewing her health Hx & the changes in the Healthcare Syst. She is concerned her Son Angie Brown is not moving back into her home from his Fr's residence as planned, but understands the mental health issues in her Ex-Husb's life. She fears he may retalliate in some way, but the situation is neg for any DV/IPV.  Interventions: Family Systems  Diagnosis:Generalized anxiety disorder  Plan: Angie Brown is being realistic about her Son coming back to live w/her. She is apprehensive about her Ex-Husb's Tx of her & what he might do legally to estrange her Son from her. She will re-engage w/her Notebook btwn our sessions to record the feelings & thoughts that are happening for her in the interim. She will cont to communicate w/her Son as this situation has improved greatly in the past year or so.  She will welcome talks w/Angie Brown as he is understanding of what happened in the marital relationship to an extent & wants to have a relationship w/her Son. Angie Brown is 16yo presently & hoping to strengthen the relationship w/his Mother as his understanding of his Parent's divorce is improved at this time.  Target Date: 09/18/2023  Progress: 6  Frequency: Once every 2-3 wks  Modality: Deborrah Fam, LMFT

## 2023-09-05 ENCOUNTER — Ambulatory Visit
Admission: EM | Admit: 2023-09-05 | Discharge: 2023-09-05 | Disposition: A | Discharge status: Home / Self Care | Attending: Internal Medicine | Admitting: Internal Medicine

## 2023-09-05 DIAGNOSIS — J4521 Mild intermittent asthma with (acute) exacerbation: Secondary | ICD-10-CM

## 2023-09-05 MED ORDER — ALBUTEROL SULFATE (2.5 MG/3ML) 0.083% IN NEBU: 2.5000 mg | INHALATION_SOLUTION | Freq: Four times a day (QID) | RESPIRATORY_TRACT | 1 refills | Status: AC | PRN

## 2023-09-05 MED ORDER — AZITHROMYCIN 250 MG PO TABS: ORAL_TABLET | ORAL | 0 refills | Status: AC

## 2023-09-05 MED ORDER — FLUCONAZOLE 150 MG PO TABS: 150.0000 mg | ORAL_TABLET | Freq: Every day | ORAL | 0 refills | Status: AC

## 2023-09-05 MED ORDER — IPRATROPIUM-ALBUTEROL 0.5-2.5 (3) MG/3ML IN SOLN
3.0000 mL | Freq: Once | RESPIRATORY_TRACT | Status: AC
  Administered 2023-09-05: 3 mL via RESPIRATORY_TRACT

## 2023-09-05 MED ORDER — METHYLPREDNISOLONE ACETATE 80 MG/ML IJ SUSP
80.0000 mg | Freq: Once | INTRAMUSCULAR | Status: AC
  Administered 2023-09-05: 80 mg via INTRAMUSCULAR

## 2023-09-05 MED ORDER — PREDNISONE 20 MG PO TABS: 40.0000 mg | ORAL_TABLET | Freq: Every day | ORAL | 0 refills | Status: AC

## 2023-09-05 NOTE — ED Provider Notes (Signed)
 [facility]    CSN: [id number] Arrival date & time: 09/05/23  1929      History   Chief Complaint Chief Complaint  Patient presents with   Shortness of Breath    HPI Angie Brown is a 43 y.o. female.   43 year old female who presents urgent care with complaints of shortness of breath.  This started on Friday.  She reports that she was taking care of a friend's son who did have strep but she denies any sore throat, fever, nausea, vomiting, abdominal pain.  She does have asthma and has been using her albuterol  inhaler.  She has a nebulizer at home but her albuterol  is out of date.  She feels like she cannot take a deep breath in.  She has a lot of wheezing.   Shortness of Breath Associated symptoms: wheezing   Associated symptoms: no abdominal pain, no chest pain, no cough, no ear pain, no fever, no rash, no sore throat and no vomiting     Past Medical History:  Diagnosis Date   Allergy    Wasps   Asthma    Bronchitis    Depression    Dysrhythmia    PVCs- stress related   Eczema    Family history of adverse reaction to anesthesia    Hypoglycemia    Hypothyroidism    PONV (postoperative nausea and vomiting)    pt prefers scop patch   Renal disorder    2008    Patient Active Problem List   Diagnosis Date Noted   BMI 30.0-30.9,adult 07/26/2022   Major depressive disorder, recurrent, mild (HCC) 05/30/2022   Nonalcoholic fatty liver disease 03/08/2022   Anal fissure 03/08/2022   Chronic vomiting 03/08/2022   Abdominal cramping 03/08/2022   Functional nausea 03/08/2022   Type 2 diabetes mellitus with hyperglycemia, without long-term current use of insulin (HCC) 10/04/2021   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   Chronic liver disease 08/14/2021   Fatigue 07/01/2021   Mental health problem 07/01/2021   Rash and nonspecific skin eruption 07/01/2021   Weight gain finding 07/01/2021   Routine physical examination 05/20/2021   Adverse food reaction  12/16/2020   Reactive airway disease without complication 12/16/2020   Hymenoptera allergy 12/16/2020   Chronic rhinitis 12/16/2020   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   Constipation 08/20/2020   Unintentional weight loss 08/20/2020   RUQ pain 08/20/2020   Bright red blood per rectum 08/20/2020   External hemorrhoids 08/20/2020   Moderate episode of recurrent major depressive disorder (HCC) 05/14/2020   BRBPR (bright red blood per rectum) 12/22/2019   Nausea without vomiting 12/22/2019   Hemorrhoids 12/22/2019   Folate deficiency 09/09/2019   Chronic fatigue 09/09/2019   Pain disorder with related psychological factors 02/05/2019   Change in bowel function 01/12/2019   Fatty liver 01/12/2019   Gastritis and gastroduodenitis 01/12/2019   Elevated LFTs 01/12/2019   Abdominal pain, right upper quadrant 01/12/2019   Nausea and vomiting    NASH (nonalcoholic steatohepatitis)    Hepatitis 12/03/2018   Transaminitis 12/03/2018   Anaphylactic reaction to wasp sting 10/12/2018   Displacement of lumbar intervertebral disc without myelopathy 01/14/2018   Essential hypertension 10/24/2017   Mixed hyperlipidemia 07/16/2017   Abdominal pain 06/08/2017   Chronic constipation 02/14/2017   Anaphylaxis 09/29/2016   Elevated glucose 09/29/2016   Chronic pain 06/29/2016   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   Voiding dysfunction 03/08/2016   Other insomnia 03/01/2016   Elevated blood pressure reading 01/05/2016  GAD (generalized anxiety disorder) 01/05/2016   Gastroesophageal reflux disease without esophagitis 01/05/2016   Recurrent major depressive disorder (HCC) 01/05/2016   Generalized anxiety disorder 11/02/2014   Stress incontinence in female 03/11/2014   Disorder of sacroiliac joint 07/23/2013   HYPOTENSION 03/18/2010   ARM PAIN 04/28/2009   ABNORMAL INVOLUNTARY MOVEMENTS 04/28/2009   FACIAL PARESTHESIA 04/28/2009   HEADACHE 04/26/2009   WHEEZING 03/23/2009    DIARRHEA, ACUTE, CHRONIC 02/19/2009   HYDRONEPHROSIS, RIGHT 02/18/2009   PYELONEPHRITIS, HX OF 02/18/2009   MIGRAINES, HX OF 02/18/2009   LUMBAR RADICULOPATHY, RIGHT 12/23/2008   Acute cystitis without hematuria 01/22/2008   BACK PAIN 01/22/2008   ELEVATED BLOOD PRESSURE WITHOUT DIAGNOSIS OF HYPERTENSION 01/22/2008   OBESITY, MORBID 05/29/2007   INSOMNIA-SLEEP DISORDER-UNSPEC 05/29/2007   HYPERSOMNIA 05/29/2007   ANEMIA-NOS 01/01/2007   ANXIETY 01/01/2007   DEPRESSION 01/01/2007   COMMON MIGRAINE 01/01/2007   ASTHMA 01/01/2007   SYNCOPE 01/01/2007   Asthma 01/01/2007   Migraine without aura and without status migrainosus, not intractable 01/01/2007    Past Surgical History:  Procedure Laterality Date   ABDOMINAL HYSTERECTOMY     ADENOIDECTOMY     BACK SURGERY  07/2009   spinal fusion L 5-S1   BIOPSY  12/07/2018   Procedure: BIOPSY;  Surgeon: Normie Becton., MD;  Location: [facility];  Service: Gastroenterology;;   BIOPSY  10/25/2020   Procedure: BIOPSY;  Surgeon: Normie Becton., MD;  Location: [facility];  Service: Gastroenterology;;   BLADDER SUSPENSION N/A 03/11/2014   Procedure: TRANSVAGINAL TAPE (TVT) PROCEDURE;  Surgeon: Cyd Dowse, MD;  Location: [facility];  Service: Gynecology;  Laterality: N/A;   CHOLECYSTECTOMY     ESOPHAGOGASTRODUODENOSCOPY (EGD) WITH PROPOFOL  N/A 12/07/2018   Procedure: ESOPHAGOGASTRODUODENOSCOPY (EGD) WITH PROPOFOL ;  Surgeon: Brice Campi Albino Alu., MD;  Location: [facility];  Service: Gastroenterology;  Laterality: N/A;   ESOPHAGOGASTRODUODENOSCOPY (EGD) WITH PROPOFOL  N/A 10/25/2020   Procedure: ESOPHAGOGASTRODUODENOSCOPY (EGD) WITH PROPOFOL ;  Surgeon: Brice Campi Albino Alu., MD;  Location: [facility];  Service: Gastroenterology;  Laterality: N/A;   TONSILLECTOMY AND ADENOIDECTOMY     TYMPANOSTOMY TUBE PLACEMENT     UPPER ESOPHAGEAL ENDOSCOPIC ULTRASOUND (EUS) N/A 10/25/2020   Procedure: UPPER ESOPHAGEAL ENDOSCOPIC  ULTRASOUND (EUS);  Surgeon: Normie Becton., MD;  Location: [facility];  Service: Gastroenterology;  Laterality: N/A;    OB History   No obstetric history on file.      Home Medications    Prior to Admission medications   Medication Sig Start Date End Date Taking? Authorizing Provider  azithromycin  (ZITHROMAX ) 250 MG tablet Take first 2 tablets together, then 1 every day until finished. 09/05/23  Yes Juliahna Wiswell A, PA-C  fluconazole  (DIFLUCAN ) 150 MG tablet Take 1 tablet (150 mg total) by mouth daily for 2 days. take 1 tablet on Day 2 of antibiotics and then repeat in 3 days 09/05/23 09/07/23 Yes Kreg Pesa, PA-C  predniSONE  (DELTASONE ) 20 MG tablet Take 2 tablets (40 mg total) by mouth daily with breakfast for 5 days. 09/05/23 09/10/23 Yes Lucresha Dismuke A, PA-C  albuterol  (PROVENTIL ) (2.5 MG/3ML) 0.083% nebulizer solution Take 3 mLs (2.5 mg total) by nebulization every 6 (six) hours as needed for wheezing or shortness of breath. 09/05/23   Ray Gervasi, Marjory Signs, PA-C  albuterol  (VENTOLIN  HFA) 108 (90 Base) MCG/ACT inhaler Inhale 1-2 puffs into the lungs every 6 (six) hours as needed for wheezing or shortness of breath. 04/01/19   Hall-Potvin, [place], PA-C  AMBULATORY NON FORMULARY MEDICATION Medication Name: Diltiazem 2%/  Lidocaine  5% Apply pea size amount 1/2 to 1 inch inside rectum 3-4 times daily 03/08/22   Mansouraty, Gabriel Jr., MD  busPIRone (BUSPAR) 10 MG tablet Take 10 mg by mouth as directed. 07/11/22   [provider]  cyclobenzaprine  (FLEXERIL ) 5 MG tablet Take 5 mg by mouth every 8 (eight) hours as needed. 10/31/22   [provider]  Dexlansoprazole  30 MG capsule DR TAKE 1 CAPSULE (30 MG TOTAL) BY MOUTH DAILY. 07/27/22   Mansouraty, Albino Alu., MD  Dexlansoprazole  30 MG capsule DR Take 30 mg by mouth as needed.    [provider]  dicyclomine  (BENTYL ) 10 MG capsule TAKE 1 CAPSULE BY MOUTH 4 TIMES DAILY, BEFORE MEALS AND AT BEDTIME.  03/08/22   Mansouraty, Albino Alu., MD  dicyclomine  (BENTYL ) 10 MG capsule Take 10 mg by mouth as needed for spasms. 09/01/21   [provider]  diphenhydrAMINE  (BENADRYL ) 25 mg capsule Take 25 mg by mouth at bedtime as needed for allergies.     [provider]  dronabinol  (MARINOL ) 2.5 MG capsule Take 1 capsule (2.5 mg total) by mouth 3 (three) times daily. 08/01/22   Mansouraty, Albino Alu., MD  dronabinol  (MARINOL ) 2.5 MG capsule Take 2.5 mg by mouth as needed. 08/12/21   [provider]  EPINEPHrine  0.3 mg/0.3 mL IJ SOAJ injection Inject 0.3 mg into the muscle as needed for anaphylaxis. 12/16/20   Trudy Fusi, DO  EPINEPHrine  0.3 mg/0.3 mL IJ SOAJ injection Inject 0.3 mg into the muscle as needed for anaphylaxis. 08/25/21   [provider]  escitalopram  (LEXAPRO ) 10 MG tablet Take 10 mg by mouth daily.    [provider]  Eszopiclone 3 MG TABS Take 3 mg by mouth at bedtime. 03/07/22   [provider]  fluticasone  (FLONASE ) 50 MCG/ACT nasal spray Place 1 spray into both nostrils 2 (two) times daily as needed (nasal congestion). 12/16/20   Trudy Fusi, DO  folic acid  (FOLVITE ) 1 MG tablet Take 1 mg by mouth daily. 09/09/19   [provider]  hydrOXYzine (VISTARIL) 25 MG capsule Take 25 mg by mouth 3 (three) times daily as needed for anxiety.    [provider]  ketorolac  (TORADOL ) 10 MG tablet Take 10 mg by mouth daily as needed.    [provider]  ketorolac  (TORADOL ) 10 MG tablet Take 1 tablet (10 mg total) by mouth every 8 (eight) hours. 12/01/22   Menshew, Raye Cai, PA-C  levocetirizine (XYZAL ) 5 MG tablet TAKE 1 TABLET BY MOUTH EVERY EVENING. 07/25/21   Trudy Fusi, DO  levocetirizine (XYZAL ) 5 MG tablet Take 1 tablet by mouth every evening. 02/01/22   [provider]  levothyroxine (SYNTHROID) 50 MCG tablet Take 50 mcg by mouth daily before breakfast.    [provider]  linaclotide  (LINZESS ) 290  MCG CAPS capsule TAKE 1 CAPSULE (290 MCG TOTAL) BY MOUTH DAILY BEFORE BREAKFAST. 03/08/22   Mansouraty, Albino Alu., MD  linaclotide  (LINZESS ) 290 MCG CAPS capsule Take 290 mcg by mouth daily before breakfast.    [provider]  losartan (COZAAR) 100 MG tablet Take 100 mg by mouth daily.    [provider]  Melatonin 10 MG CAPS Take 1 capsule by mouth as needed.    [provider]  montelukast  (SINGULAIR ) 10 MG tablet Take 1 tablet (10 mg total) by mouth at bedtime. 12/16/20   Trudy Fusi, DO  moxifloxacin (VIGAMOX) 0.5 % ophthalmic solution Place 1 drop into  the right eye as directed. 10/20/22   [provider]  mupirocin  ointment (BACTROBAN ) 2 % Apply 1 Application topically 2 (two) times daily. 04/04/22   [provider]  nirmatrelvir & ritonavir (PAXLOVID) 20 x 150 MG & 10 x 100MG  TBPK Take 1 tablet by mouth as directed. Take 300 mg nirmatrelvir (two 150 mg tablets) with 100 mg ritonavir (one 100 mg tablet) by mouth twice daily for 5 days. 11/28/21   [provider]  OZEMPIC, 1 MG/DOSE, 4 MG/3ML SOPN Inject 1 mg into the skin once a week.    [provider]  promethazine  (PHENERGAN ) 25 MG tablet TAKE 1 TABLET BY MOUTH EVERY 6 HOURS AS NEEDED. 07/17/23   Mansouraty, Albino Alu., MD  REZDIFFRA 80 MG TABS Take 1 tablet by mouth daily.    [provider]  rizatriptan (MAXALT-MLT) 10 MG disintegrating tablet DISSOLVE 1 TABLET ON THE TONGUE AS NEEDED FOR MIGRAINE. MAY REPEAT IN 2 HOURS IF NEEDED. 01/14/21   [provider]  rizatriptan (MAXALT-MLT) 10 MG disintegrating tablet Take 10 mg by mouth as needed for migraine. 10/25/22   [provider]  rosuvastatin (CRESTOR) 10 MG tablet Take 10 mg by mouth daily. 11/09/22   [provider]  rosuvastatin (CRESTOR) 10 MG tablet Take 10 mg by mouth daily.    [provider]  Semaglutide, 1 MG/DOSE, (OZEMPIC, 1 MG/DOSE,) 4 MG/3ML SOPN Inject 1 mg as directed every  7 days. 07/26/22   [provider]  sucralfate  (CARAFATE ) 1 GM/10ML suspension Take 10 mLs (1 g total) by mouth 2 (two) times daily. 10/25/20 10/25/21  Mansouraty, Albino Alu., MD  Vilazodone HCl (VIIBRYD) 10 MG TABS Take 10 mg by mouth daily. 03/07/22   [provider]  Vitamin D, Ergocalciferol, (DRISDOL) 1.25 MG (50000 UNIT) CAPS capsule Take 50,000 Units by mouth once a week. 09/09/19   [provider]  Vitamin D, Ergocalciferol, (DRISDOL) 1.25 MG (50000 UNIT) CAPS capsule Take 1 capsule by mouth once a week. 10/14/21 10/30/23  [provider]    Family History Family History  Problem Relation Age of Onset   Asthma Mother    Allergic rhinitis Mother    Liver disease Mother        Fatty liver, NASH   GI Bleed Mother    Hypothyroidism Mother    Allergic rhinitis Father    Hypothyroidism Sister    Liver disease Sister        fatty liver   Asthma Sister    Allergic rhinitis Sister    Hypothyroidism Sister    Liver disease Sister        fatty liver   Thyroid  cancer Sister    Liver cancer Maternal Grandfather    Liver disease Maternal Grandfather        fatty liver   Colon cancer Neg Hx    Esophageal cancer Neg Hx    Rectal cancer Neg Hx    Inflammatory bowel disease Neg Hx    Pancreatic cancer Neg Hx    Stomach cancer Neg Hx     Social History Social History   Tobacco Use   Smoking status: Former    Current packs/day: 0.00    Types: Cigarettes    Quit date: 08/2021    Years since quitting: 2.0   Smokeless tobacco: Never  Vaping Use   Vaping status: Former   Substances: Nicotine, Flavoring  Substance Use Topics   Alcohol  use: Not Currently   Drug use: No  Allergies   Loratadine , Wasp venom, Ibuprofen, Latex, Sumatriptan, Hepatitis a vaccine, Doxycycline, Fentanyl , Methocarbamol, Tape, and Wound dressing adhesive   Review of Systems Review of Systems  Constitutional:  Negative for chills and fever.  HENT:  Negative for ear pain  and sore throat.   Eyes:  Negative for pain and visual disturbance.  Respiratory:  Positive for shortness of breath and wheezing. Negative for cough.   Cardiovascular:  Negative for chest pain and palpitations.  Gastrointestinal:  Negative for abdominal pain and vomiting.  Genitourinary:  Negative for dysuria and hematuria.  Musculoskeletal:  Negative for arthralgias and back pain.  Skin:  Negative for color change and rash.  Neurological:  Negative for seizures and syncope.  All other systems reviewed and are negative.    Physical Exam Triage Vital Signs ED Triage Vitals  Encounter Vitals Group     BP 09/05/23 1938 107/79     Girls Systolic BP Percentile --      Girls Diastolic BP Percentile --      Boys Systolic BP Percentile --      Boys Diastolic BP Percentile --      Pulse Rate 09/05/23 1937 82     Resp 09/05/23 1937 (!) 21     Temp 09/05/23 1937 98.6 F (37 C)     Temp Source 09/05/23 1937 Oral     SpO2 09/05/23 1937 98 %     Weight --      Height --      Head Circumference --      Peak Flow --      Pain Score --      Pain Loc --      Pain Education --      Exclude from Growth Chart --    No data found.  Updated Vital Signs BP 107/79 (BP Location: Right Arm)   Pulse 82   Temp 98.6 F (37 C) (Oral)   Resp (!) 21   SpO2 98%   Visual Acuity Right Eye Distance:   Left Eye Distance:   Bilateral Distance:    Right Eye Near:   Left Eye Near:    Bilateral Near:     Physical Exam Vitals and nursing note reviewed.  Constitutional:      General: She is not in acute distress.    Appearance: She is well-developed.  HENT:     Head: Normocephalic and atraumatic.   Eyes:     Conjunctiva/sclera: Conjunctivae normal.    Cardiovascular:     Rate and Rhythm: Normal rate and regular rhythm.     Heart sounds: No murmur heard. Pulmonary:     Effort: Pulmonary effort is normal. Tachypnea present. No respiratory distress.     Breath sounds: Examination of the  right-upper field reveals wheezing. Examination of the left-upper field reveals wheezing. Examination of the right-middle field reveals wheezing. Examination of the left-middle field reveals wheezing. Examination of the right-lower field reveals wheezing. Examination of the left-lower field reveals wheezing. Wheezing present. No decreased breath sounds or rhonchi.     Comments: After DuoNeb administration, the patient's breathing is more normalized and there is less wheezing throughout all lung sounds but there is still some present Abdominal:     Palpations: Abdomen is soft.     Tenderness: There is no abdominal tenderness.   Musculoskeletal:        General: No swelling.     Cervical back: Neck supple.   Skin:    General: Skin is  warm and dry.     Capillary Refill: Capillary refill takes less than 2 seconds.   Neurological:     Mental Status: She is alert.   Psychiatric:        Mood and Affect: Mood normal.      UC Treatments / Results  Labs (all labs ordered are listed, but only abnormal results are displayed) Labs Reviewed - No data to display  EKG   Radiology No results found.  Procedures Procedures (including critical care time)  Medications Ordered in UC Medications  ipratropium-albuterol  (DUONEB) 0.5-2.5 (3) MG/3ML nebulizer solution 3 mL (3 mLs Nebulization Given 09/05/23 1948)  methylPREDNISolone  acetate (DEPO-MEDROL ) injection 80 mg (80 mg Intramuscular Given 09/05/23 1948)    Initial Impression / Assessment and Plan / UC Course  I have reviewed the triage vital signs and the nursing notes.  Pertinent labs & imaging results that were available during my care of the patient were reviewed by me and considered in my medical decision making (see chart for details).     Mild intermittent asthma with acute exacerbation   The patient presented with symptoms of an acute asthma exacerbation.  Her vital signs were within normal limits however she was visibly having  trouble breathing.  DuoNeb nebulizing treatment and Medrol  injection were given and the patient reported significant improvement in her symptoms.  She is able to breathe normally.  Her vital signs remained stable.  We discharged home in stable condition with strict return precautions if her symptoms worsen.  We treated her with the following: DuoNeb nebulizing treatment given here in the office today with improvement in lung sounds and symptoms Medrol  injection given today.  This is a steroid to help with inflammation in the airway Azithromycin  250mg  Take 2 tablets today and the 1 tablet daily for 4 more days. Prednisone  40 mg (2 tablets) once daily for 5 days. Take this in the morning.  This is a steroid to help with inflammation in the airway Albuterol  nebulizing treatment every 4-6 hours as needed for wheezing/shortness of breath.  Diflucan  150 mg take 1 tablet on day 2 of the antibiotics then repeat in 3 days. If symptoms worsen then recommend going to an emergency department where more advanced treatment can be obtained May return to urgent care as needed.  Final Clinical Impressions(s) / UC Diagnoses   Final diagnoses:  Mild intermittent asthma with acute exacerbation     Discharge Instructions      Acute asthma exacerbation.  We have treated this today with nebulizing treatments as well as a steroid injection.  There has been improvement in symptoms however the lung sounds are not completely clear.  We have done the following for continued treatment: DuoNeb nebulizing treatment given here in the office today with improvement in lung sounds and symptoms Medrol  injection given today.  This is a steroid to help with inflammation in the airway Azithromycin  250mg  Take 2 tablets today and the 1 tablet daily for 4 more days. Prednisone  40 mg (2 tablets) once daily for 5 days. Take this in the morning.  This is a steroid to help with inflammation in the airway Albuterol  nebulizing treatment  every 4-6 hours as needed for wheezing/shortness of breath.  Diflucan  150 mg take 1 tablet on day 2 of the antibiotics then repeat in 3 days. If symptoms worsen then recommend going to an emergency department where more advanced treatment can be obtained May return to urgent care as needed.    ED Prescriptions  Medication Sig Dispense Auth. Provider   albuterol  (PROVENTIL ) (2.5 MG/3ML) 0.083% nebulizer solution Take 3 mLs (2.5 mg total) by nebulization every 6 (six) hours as needed for wheezing or shortness of breath. 75 mL Marleen Moret A, PA-C   predniSONE  (DELTASONE ) 20 MG tablet Take 2 tablets (40 mg total) by mouth daily with breakfast for 5 days. 10 tablet Lorenzo Romberg A, PA-C   azithromycin  (ZITHROMAX ) 250 MG tablet Take first 2 tablets together, then 1 every day until finished. 6 tablet Kreg Pesa, PA-C   fluconazole  (DIFLUCAN ) 150 MG tablet Take 1 tablet (150 mg total) by mouth daily for 2 days. take 1 tablet on Day 2 of antibiotics and then repeat in 3 days 2 tablet Kreg Pesa, PA-C      PDMP not reviewed this encounter.   Acie Acosta 09/05/23 2008

## 2023-09-05 NOTE — ED Triage Notes (Signed)
 Pt present with SOB. States she has been using her inhaler since Friday. Pt c/o not being able to take a deep breath. Has pain on the rt lower side of her chest when attempting deep breaths.

## 2023-09-05 NOTE — Discharge Instructions (Addendum)
 Acute asthma exacerbation.  We have treated this today with nebulizing treatments as well as a steroid injection.  There has been improvement in symptoms however the lung sounds are not completely clear.  We have done the following for continued treatment: DuoNeb nebulizing treatment given here in the office today with improvement in lung sounds and symptoms Medrol  injection given today.  This is a steroid to help with inflammation in the airway Azithromycin  250mg  Take 2 tablets today and the 1 tablet daily for 4 more days. Prednisone  40 mg (2 tablets) once daily for 5 days. Take this in the morning.  This is a steroid to help with inflammation in the airway Albuterol  nebulizing treatment every 4-6 hours as needed for wheezing/shortness of breath.  Diflucan  150 mg take 1 tablet on day 2 of the antibiotics then repeat in 3 days. If symptoms worsen then recommend going to an emergency department where more advanced treatment can be obtained May return to urgent care as needed.

## 2023-09-12 ENCOUNTER — Other Ambulatory Visit: Payer: Self-pay | Admitting: Gastroenterology

## 2023-09-13 ENCOUNTER — Telehealth: Payer: Self-pay | Admitting: Gastroenterology

## 2023-09-13 MED ORDER — DICYCLOMINE HCL 10 MG PO CAPS
ORAL_CAPSULE | ORAL | 0 refills | Status: DC
Start: 2023-09-13 — End: 2024-01-29

## 2023-09-13 MED ORDER — PROMETHAZINE HCL 25 MG PO TABS: 25.0000 mg | ORAL_TABLET | Freq: Four times a day (QID) | ORAL | 0 refills | Status: DC | PRN

## 2023-09-13 NOTE — Telephone Encounter (Signed)
 Patient requesting medication refill for phenegran and bentyle.   Please send into [facility].

## 2023-09-13 NOTE — Telephone Encounter (Signed)
 30 supply of Bentyl  and Promethazine  sent to pharmacy. Patient needs follow -up appointment. Patient can be scheduled with APP's.

## 2023-09-18 ENCOUNTER — Other Ambulatory Visit (HOSPITAL_COMMUNITY): Payer: Self-pay

## 2023-09-18 ENCOUNTER — Telehealth: Payer: Self-pay

## 2023-09-18 ENCOUNTER — Other Ambulatory Visit: Payer: Self-pay | Admitting: Gastroenterology

## 2023-09-18 NOTE — Telephone Encounter (Signed)
 Pharmacy Patient Advocate Encounter   Received notification from CoverMyMeds that prior authorization for Dexlansoprazole  30MG  dr capsules is required/requested.   Insurance verification completed.   The patient is insured through [facility] [place] .   Per test claim: The current 30 day co-pay is, $4.00.  No PA needed at this time. This test claim was processed through [facility]- copay amounts may vary at other pharmacies due to pharmacy/plan contracts, or as the patient moves through the different stages of their insurance plan.     Must be processed as BRAND NAME DEXILANT  for submission.

## 2023-09-27 ENCOUNTER — Ambulatory Visit: Admitting: Behavioral Health

## 2023-10-01 ENCOUNTER — Ambulatory Visit (INDEPENDENT_AMBULATORY_CARE_PROVIDER_SITE_OTHER): Admitting: Behavioral Health

## 2023-10-01 DIAGNOSIS — F411 Generalized anxiety disorder: Secondary | ICD-10-CM | POA: Diagnosis not present
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
## 2023-10-01 NOTE — Progress Notes (Deleted)
 [facility] Counselor/Therapist Progress Note  Patient ID: Angie Brown, MRN: [id number]    Date: 10/01/23  Time Spent: ***  {LBBHAMPM:26719} - *** {LBBHAMPM:26719} : *** Minutes  Treatment Type: Individual Therapy.  Reported Symptoms: ***  Mental Status Exam: Appearance:  {PSY:22683}     Behavior: {PSY:21022743}  Motor: {PSY:22302}  Speech/Language:  {PSY:22685}  Affect: {PSY:22687}  Mood: {PSY:31886}  Thought process: {PSY:31888}  Thought content:   {[phone]}  Sensory/Perceptual disturbances:   {PSY:647-085-9966}  Orientation: {PSY:30297}  Attention: {PSY:22877}  Concentration: {PSY:716 204 9751}  Memory: {PSY:252-810-8351}  Fund of knowledge:  {PSY:716 204 9751}  Insight:   {PSY:716 204 9751}  Judgment:  {PSY:716 204 9751}  Impulse Control: {PSY:716 204 9751}   Risk Assessment: Danger to Self:  {PSY:22692} Self-injurious Behavior: {PSY:22692} Danger to Others: {PSY:22692} Duty to Warn:{PSY:311194} Physical Aggression / Violence:{PSY:21197} Access to Firearms a concern: {PSY:21197} Gang Involvement:{PSY:21197}  Subjective:   Angie Brown participated in the session, in person in the office with the therapist, and consented to treatment.   ***   Interventions: {PSY:[phone]}  Diagnosis:  No diagnosis found.   Plan: ***Patient is to use CBT, mindfulness and coping skills to help manage decrease symptoms associated with their diagnosis.   Long-term goal:   ***Reduce overall level, frequency, and intensity of the feelings of depression, anxiety and panic evidenced by       decreased irritability, negative self talk, and helpless feelings from 6 to 7 days/week to 0 to 1 days/week per client report for at least 3 consecutive months.  Short-term goal:  ***Verbally express understanding of the relationship between feelings of depression, anxiety and their impact on thinking patterns and behaviors. Verbalize an understanding of the role that distorted  thinking plays in creating fears, excessive worry, and ruminations.  Angie LITTIE Ling, LMFT

## 2023-10-10 ENCOUNTER — Telehealth: Payer: Self-pay | Admitting: Gastroenterology

## 2023-10-10 MED ORDER — PROMETHAZINE HCL 25 MG PO TABS: 25.0000 mg | ORAL_TABLET | Freq: Four times a day (QID) | ORAL | 0 refills | Status: DC | PRN

## 2023-10-10 NOTE — Telephone Encounter (Signed)
 Patient needs to scheduled for follow-up. Appt can be with one of the APPS in [place]. It has been a while since she has been seen. I sent 1 refill to her pharmacy, until she can be seen in the office.

## 2023-10-10 NOTE — Telephone Encounter (Signed)
 PT is calling to have a refill for phenergan  sent to [facility]. Please advise

## 2023-10-18 ENCOUNTER — Ambulatory Visit: Admitting: Behavioral Health

## 2023-10-18 DIAGNOSIS — F411 Generalized anxiety disorder: Secondary | ICD-10-CM | POA: Diagnosis not present
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
## 2023-10-24 ENCOUNTER — Other Ambulatory Visit: Payer: Self-pay | Admitting: Gastroenterology

## 2023-10-24 NOTE — Telephone Encounter (Signed)
 PATIENT NEEDS TO SCHEDULE FOLLOW UP APPT

## 2023-11-02 ENCOUNTER — Ambulatory Visit (INDEPENDENT_AMBULATORY_CARE_PROVIDER_SITE_OTHER): Admitting: Behavioral Health

## 2023-11-02 DIAGNOSIS — F411 Generalized anxiety disorder: Secondary | ICD-10-CM | POA: Diagnosis not present
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
## 2023-11-09 NOTE — Telephone Encounter (Signed)
 Left vm for patient to call and schedule FU

## 2023-11-16 ENCOUNTER — Ambulatory Visit (INDEPENDENT_AMBULATORY_CARE_PROVIDER_SITE_OTHER): Admitting: Behavioral Health

## 2023-11-16 DIAGNOSIS — F411 Generalized anxiety disorder: Secondary | ICD-10-CM | POA: Diagnosis not present

## 2023-11-16 NOTE — Progress Notes (Signed)
 [facility] Counselor/Therapist Progress Note  Patient ID: Angie Brown, MRN: [id number],    Date: 11/16/2023  Time Spent: 45 min Caregility video; Pt is home in private & Provider working remotely from [employer]. Pt is aware of the risks/limitations of telehealth & consents to Tx today. Time In: 12:00pm Time Out: 12:45pm  Treatment Type: Individual Therapy  Reported Symptoms: Elevated anx/dep & improvements in sleep the past few wks  Mental Status Exam: Appearance:  Casual     Behavior: Appropriate and Sharing  Motor: Normal  Speech/Language:  Clear and Coherent  Affect: Appropriate  Mood: normal  Thought process: normal  Thought content:   WNL  Sensory/Perceptual disturbances:   WNL  Orientation: oriented to person, place, time/date, and situation  Attention: Good  Concentration: Good  Memory: WNL  Fund of knowledge:  Good  Insight:   Good  Judgment:  Good  Impulse Control: Good   Risk Assessment: Danger to Self:  No Self-injurious Behavior: No Danger to Others: No Duty to Warn:no Physical Aggression / Violence:No  Access to Firearms a concern: No  Gang Involvement:No   Subjective: Pt has discovered a man on TikTok feed who is a long Secondary school teacher. They have spoken several times while he is on the road. He drives very long distances. They have not met in person yet. He lives @ [facility] & they might not meet soon.   She is feeling some remission from Sx of dep/anx lately. This has gotten her in a better mood. In general, she is feeling improved health outcomes. She is generally seeing things get better & her mood is also more positive.  Pt is reviewing her Hx of psychotherapy & her journey through the last 9 yrs.    Interventions: Family Systems and Interpersonal  Diagnosis:Generalized anxiety disorder  Plan: Laurna will cont to be cautious w/her new female interest. They are interested in ea other & taking it slow. She is thinking in  historical terms since they have been renovating her Grandparents home since the [place] lft in the past month. She has been looking @ pictures of the last few days of her Mother & Gmother's lives. This has brought many good memories. She has grown in the past 9 yrs.   Target Date: 12/18/2023  Progress: 7  Frequency: Once every 2-3 wks  Modality: [name], LMFT

## 2023-11-20 ENCOUNTER — Telehealth: Payer: Self-pay | Admitting: Gastroenterology

## 2023-11-20 NOTE — Telephone Encounter (Signed)
 The pt cancelled appt for 9/3 and has been rescheduled to November.  She would like to have another refill of promethazine . Do you want to refill?

## 2023-11-20 NOTE — Telephone Encounter (Signed)
 Refills OK for this patient until her November clinic date, due to availability.   Thanks. GM

## 2023-11-20 NOTE — Telephone Encounter (Signed)
 Inbound call from patient informing us  that she is needing to reschedule her appointment she had on September the 3 rd. Patient was reschedule for November the 11. Patient is requesting to have a refill for the medication Promethazine  25 MG tablets until she is able to be seen until on the 11th of November. Please advise.

## 2023-11-21 ENCOUNTER — Ambulatory Visit: Admitting: Gastroenterology

## 2023-11-21 ENCOUNTER — Other Ambulatory Visit: Payer: Self-pay

## 2023-11-21 MED ORDER — PROMETHAZINE HCL 25 MG PO TABS: 25.0000 mg | ORAL_TABLET | Freq: Four times a day (QID) | ORAL | 2 refills | Status: DC | PRN

## 2023-11-21 NOTE — Telephone Encounter (Signed)
 The pt has been advised and refill has been sent. She is aware that she needs appt for further refills.  She currently has COVID and had to cancel appt for this week

## 2023-11-21 NOTE — Telephone Encounter (Signed)
 Left message on machine to call back

## 2023-12-06 ENCOUNTER — Ambulatory Visit: Admitting: Behavioral Health

## 2023-12-06 DIAGNOSIS — F411 Generalized anxiety disorder: Secondary | ICD-10-CM | POA: Diagnosis not present

## 2023-12-06 NOTE — Progress Notes (Signed)
 [facility] Counselor/Therapist Progress Note  Patient ID: Angie Brown, MRN: [id number],    Date: 12/06/2023  Time Spent: 30 min Caregility video; Pt is home in private & Provider working remotely from [employer]. Pt is aware of the risks/limitations of telehealth & consents to Tx today. Time In: 2:00pm Time Out: 2:30pm  Treatment Type: Individual Therapy  Reported Symptoms: Elevated anx/dep & stress due to Family dynamics  Mental Status Exam: Appearance:  Casual     Behavior: Appropriate and Sharing  Motor: Normal  Speech/Language:  Clear and Coherent  Affect: Appropriate  Mood: normal  Thought process: normal  Thought content:   WNL  Sensory/Perceptual disturbances:   WNL  Orientation: oriented to person, place, time/date, and situation  Attention: Good  Concentration: Good  Memory: WNL  Fund of knowledge:  Good  Insight:   Good  Judgment:  Good  Impulse Control: Good   Risk Assessment: Danger to Self:  No Self-injurious Behavior: No Danger to Others: No Duty to Warn:no Physical Aggression / Violence:No  Access to Firearms a concern: No  Gang Involvement:No   Subjective: Pt is tired from [employer]. Pt has been trying to sleep & rest after her Family vacation to [facility] w/12 Adults & 4 children.   Pt's Step Mother opted out of the [place] Trip due to circumstances around her job. Pt is skeptical about the reasons for her Step Mother's reasoning.   Pt is keeping the focus on her children. Her Son Lang talks w/her a lot & trusts her. Her Dtr [name] also trusts her.    Interventions: Insight-Oriented and Family Systems  Diagnosis:Generalized anxiety disorder  Plan: Hanalei will cont to examine the recent [facility] Trip & her relationship w/her Step Mother & we will discuss this further.  Target Date: 01/02/2024  Progress:6  Frequency: Once every 3 wks  Modality: [name], LMFT

## 2023-12-27 ENCOUNTER — Ambulatory Visit (INDEPENDENT_AMBULATORY_CARE_PROVIDER_SITE_OTHER): Admitting: Behavioral Health

## 2023-12-27 DIAGNOSIS — F411 Generalized anxiety disorder: Secondary | ICD-10-CM | POA: Diagnosis not present

## 2023-12-27 NOTE — Progress Notes (Signed)
 [facility] Counselor/Therapist Progress Note  Patient ID: Angie Brown, MRN: [id number],    Date: 12/27/2023  Time Spent: 50 min Caregility video; Pt is home in private & Provider working remotely from [employer]. Pt is aware of the risks/limitations of telehealth & consents to Tx today. Time In:2:00pm Time Out: 2:50pm  Treatment Type: Individual Therapy  Reported Symptoms: Elevated anx/dep & stress w/health status changes & the Family dynamics of her Son Angie Brown [phone]) who is stationed @ [employer] in [place]  Mental Status Exam: Appearance:  Casual     Behavior: Appropriate and Sharing  Motor: Normal  Speech/Language:  Clear and Coherent  Affect: Appropriate  Mood: anxious  Thought process: normal  Thought content:   WNL  Sensory/Perceptual disturbances:   WNL  Orientation: oriented to person, place, time/date, and situation  Attention: Good  Concentration: Good  Memory: WNL  Fund of knowledge:  Good  Insight:   Good  Judgment:  Good  Impulse Control: Good   Risk Assessment: Danger to Self:  No Self-injurious Behavior: No Danger to Others: No Duty to Warn:no Physical Aggression / Violence:No  Access to Firearms a concern: No  Gang Involvement:No   Subjective: Pt is upset due to Son's decision to un-invite her from [name]. Pt has promoted the relationship w/their Dad. The children consider her the safe Parent. She is navigating a visit from [name] this week. She has been privy to knowing about her past Hx of BF 'BJ'. A Emergency planning/management officer has come searching for him. Pt is avoiding any man who approaches her.   Pt is struggling w/food the past few wks; she has gained 20# in those wks. Pt is upset due to the inc'd anxiety caused by this s/e.SABRAHer brain, cannot settle.  Pt is trying to determine how her lack of emot'l regulation has impacted her life. Today she is looking @ last year's Thanksgiving celebration.   Interventions: Family  Systems  Diagnosis:Generalized anxiety disorder  Plan: [name] will explore the s/e of Ozempic more fully w/Dr. Deane. [name] is trying to piece her Family Hx tgthr.   Target Date: 01/17/2024  Progress: 6  Frequency: Once every 2-3 wks  Modality: [name], LMFT

## 2024-01-09 ENCOUNTER — Ambulatory Visit: Admitting: Behavioral Health

## 2024-01-09 DIAGNOSIS — F411 Generalized anxiety disorder: Secondary | ICD-10-CM | POA: Diagnosis not present
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
## 2024-01-16 ENCOUNTER — Ambulatory Visit: Admitting: Behavioral Health

## 2024-01-23 ENCOUNTER — Ambulatory Visit: Admitting: Behavioral Health

## 2024-01-24 ENCOUNTER — Ambulatory Visit: Admitting: Behavioral Health

## 2024-01-24 DIAGNOSIS — F4329 Adjustment disorder with other symptoms: Secondary | ICD-10-CM | POA: Diagnosis not present

## 2024-01-24 DIAGNOSIS — F411 Generalized anxiety disorder: Secondary | ICD-10-CM | POA: Diagnosis not present

## 2024-01-24 NOTE — Progress Notes (Addendum)
 [facility] Counselor/Therapist Progress Note  Patient ID: Angie Brown, MRN: [id number],    Date: 01/24/2024  Time Spent:  50 min Caregility video; Pt is home in private & Provider working remotely from [employer]. Pt is aware of the risks/limitations of telehealth & consents to Tx today.  Time In: 2:00pm Time Out: 2:50pm  Treatment Type: Individual Therapy  Reported Symptoms: Elevated anx/dep & stress over relationship  Mental Status Exam: Appearance:  Casual     Behavior: Appropriate and Sharing  Motor: Normal  Speech/Language:  Clear and Coherent  Affect: Appropriate  Mood: depressed  Thought process: normal  Thought content:   WNL  Sensory/Perceptual disturbances:   WNL  Orientation: oriented to person, place, time/date, and situation  Attention: Good  Concentration: Good  Memory: WNL  Fund of knowledge:  Fair  Insight:   Fair  Judgment:  Good  Impulse Control: Good   Risk Assessment: Danger to Self:  No Self-injurious Behavior: No Danger to Others: No Duty to Warn:no Physical Aggression / Violence:No  Access to Firearms a concern: No  Gang Involvement:No   Subjective: Son Angie Brown is grounded until 02/28/2024. He has been sleeping through his first period @ [employer]. Dad Angie Brown is punishing him. Pt feels punished by this turn of events.   New female friend has asked her on a Dinner Date. She decided not to go bc she had a tooth pulled. She feels the circumstances were not positive. She feels she meets people at the most inopportune times. She is grieving for her Mother's death 9 yrs ago on 01/30/24. Her relationship w/her Mother has been diluted & minimized. Pt & her 2 Sisters have tried to honor her in the past. Dad has been uninvolved in these efforts.    Interventions: Family Systems  Diagnosis:Generalized anxiety disorder  Grief reaction with prolonged bereavement  Plan: Angie Brown will cont to explore her Dad's rxn to her Mother's death. She is  looking back & reviewing her Hx w/her family's exp of grief. She will make a few dishes & go to her Str Angie Brown's home. Now, she will need to acclimate to the new schedule for Thanksgiving.   Target Date: 02/18/2024  Progress: 4  Frequency: Once every 2-3 wks  Modality: Kennis  Next visit: Discuss Estrangement in Families  Amiere Cawley L Quiana Cobaugh, LMFT

## 2024-01-29 ENCOUNTER — Ambulatory Visit: Admitting: Gastroenterology

## 2024-01-29 ENCOUNTER — Encounter: Payer: Self-pay | Admitting: Gastroenterology

## 2024-01-29 VITALS — BP 122/74 | HR 80 | Ht 67.0 in | Wt 196.5 lb

## 2024-01-29 DIAGNOSIS — R635 Abnormal weight gain: Secondary | ICD-10-CM

## 2024-01-29 DIAGNOSIS — R110 Nausea: Secondary | ICD-10-CM

## 2024-01-29 DIAGNOSIS — K760 Fatty (change of) liver, not elsewhere classified: Secondary | ICD-10-CM

## 2024-01-29 DIAGNOSIS — K7581 Nonalcoholic steatohepatitis (NASH): Secondary | ICD-10-CM | POA: Diagnosis not present

## 2024-01-29 MED ORDER — DEXLANSOPRAZOLE 30 MG PO CPDR: 30.0000 mg | DELAYED_RELEASE_CAPSULE | Freq: Every day | ORAL | 2 refills | Status: AC

## 2024-01-29 MED ORDER — PROMETHAZINE HCL 25 MG PO TABS: 25.0000 mg | ORAL_TABLET | Freq: Four times a day (QID) | ORAL | 2 refills | Status: DC | PRN

## 2024-01-29 NOTE — Patient Instructions (Signed)
 We have sent the following medications to your pharmacy for you to pick up at your convenience: Dexilant , Phenergan    Follow-up in 1 year, sooner if needed.   _______________________________________________________  If your blood pressure at your visit was 140/90 or greater, please contact your primary care physician to follow up on this.  _______________________________________________________  If you are age 44 or older, your body mass index should be between 23-30. Your Body mass index is 30.78 kg/m. If this is out of the aforementioned range listed, please consider follow up with your Primary Care Provider.  If you are age 17 or younger, your body mass index should be between 19-25. Your Body mass index is 30.78 kg/m. If this is out of the aformentioned range listed, please consider follow up with your Primary Care Provider.   ________________________________________________________  The [facility] providers would like to encourage you to use MYCHART to communicate with providers for non-urgent requests or questions.  Due to long hold times on the telephone, sending your provider a message by [facility] may be a faster and more efficient way to get a response.  Please allow 48 business hours for a response.  Please remember that this is for non-urgent requests.  _______________________________________________________  [facility] is using a team-based approach to care.  Your team is made up of your doctor and two to three APPS. Our APPS (Nurse Practitioners and Physician Assistants) work with your physician to ensure care continuity for you. They are fully qualified to address your health concerns and develop a treatment plan. They communicate directly with your gastroenterologist to care for you. Seeing the Advanced Practice Practitioners on your physician's team can help you by facilitating care more promptly, often allowing for earlier appointments, access to diagnostic  testing, procedures, and other specialty referrals.   Thank you for choosing me and [facility].  Dr. Wilhelmenia

## 2024-01-29 NOTE — Progress Notes (Unsigned)
 GASTROENTEROLOGY OUTPATIENT CLINIC VISIT   Primary Care Provider Deane Camie HERO., MD [address] [place] [address] [phone]  Patient Profile: Angie Brown is a 43 y.o. 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  The patient presents to the [facility] for an evaluation and management of problem(s) noted below:  Problem List No diagnosis found.   History of Present Illness Please see prior notes for full details of HPI.  Interval History Patient returns for follow up.  Patient has been on Ozempic.  She has lost nearly 40 pounds.  She still is experiencing the same Nausea/Vomiting issues, even before Ozempic was initiated and does not believe that it has effected this more so.  She was upset personally due to the recent Liver U/S still showing steatosis.  The patient denies any issues with jaundice, scleral icterus, generalized pruritus, darkened/amber urine, clay-colored stools, LE edema, hematemesis, coffee-ground emesis, abdominal distention, confusion.  If she strains she will have some BRB on the toilet paper  GI Review of Systems Positive as above including bloating at times Negative for dysphagia, odynophagia, melena  Review of Systems General: Denies fevers/chills/unintentional weight loss Cardiovascular: Denies chest pain Pulmonary: Denies shortness of breath Gastroenterological: See HPI Genitourinary: Denies darkened urine Hematological: Denies easy bruising/bleeding Dermatological: Denies jaundice Psychological: Mood is stable   Medications Current Outpatient Medications  Medication Sig Dispense Refill   albuterol  (PROVENTIL ) (2.5 MG/3ML) 0.083% nebulizer  solution Take 3 mLs (2.5 mg total) by nebulization every 6 (six) hours as needed for wheezing or shortness of breath. 75 mL 1   albuterol  (VENTOLIN  HFA) 108 (90 Base) MCG/ACT inhaler Inhale 1-2 puffs into the lungs every 6 (six) hours as needed for wheezing or shortness of breath. 18 g 0   AMBULATORY NON FORMULARY MEDICATION Medication Name: Diltiazem 2%/ Lidocaine  5% Apply pea size amount 1/2 to 1 inch inside rectum 3-4 times daily 30 g 0   azithromycin  (ZITHROMAX ) 250 MG tablet Take first 2 tablets together, then 1 every day until finished. 6 tablet 0   busPIRone (BUSPAR) 10 MG tablet Take 10 mg by mouth as directed.     cyclobenzaprine  (FLEXERIL ) 5 MG tablet Take 5 mg by mouth every 8 (eight) hours as needed.     Dexlansoprazole  30 MG capsule DR Take 30 mg by mouth as needed.     Dexlansoprazole  30 MG capsule DR TAKE 1 CAPSULE (30 MG TOTAL) BY MOUTH DAILY. 30 capsule 1   dicyclomine  (BENTYL ) 10 MG capsule Take 10 mg by mouth as needed for spasms.     dicyclomine  (BENTYL ) 10 MG capsule TAKE 1 CAPSULE BY MOUTH 4 TIMES DAILY, BEFORE MEALS AND AT BEDTIME. 60 capsule 0   diphenhydrAMINE  (BENADRYL ) 25 mg capsule Take 25 mg by mouth at bedtime as needed for allergies.      dronabinol  (MARINOL ) 2.5 MG capsule Take 1 capsule (2.5 mg total) by mouth 3 (three) times daily. 90 capsule 0   dronabinol  (MARINOL ) 2.5 MG capsule Take 2.5 mg by mouth as needed.     EPINEPHrine  0.3 mg/0.3 mL IJ SOAJ injection Inject 0.3 mg into the muscle as needed for anaphylaxis. 1 each 2   EPINEPHrine  0.3 mg/0.3 mL IJ SOAJ injection Inject 0.3 mg into the muscle as needed for anaphylaxis.  escitalopram  (LEXAPRO ) 10 MG tablet Take 10 mg by mouth daily.     Eszopiclone 3 MG TABS Take 3 mg by mouth at bedtime.     fluticasone  (FLONASE ) 50 MCG/ACT nasal spray Place 1 spray into both nostrils 2 (two) times daily as needed (nasal congestion). 16 g 5   folic acid  (FOLVITE ) 1 MG tablet Take 1 mg by mouth daily.     hydrOXYzine  (VISTARIL) 25 MG capsule Take 25 mg by mouth 3 (three) times daily as needed for anxiety.     ketorolac  (TORADOL ) 10 MG tablet Take 10 mg by mouth daily as needed.     ketorolac  (TORADOL ) 10 MG tablet Take 1 tablet (10 mg total) by mouth every 8 (eight) hours. 15 tablet 0   levocetirizine (XYZAL ) 5 MG tablet TAKE 1 TABLET BY MOUTH EVERY EVENING. 30 tablet 5   levocetirizine (XYZAL ) 5 MG tablet Take 1 tablet by mouth every evening.     levothyroxine (SYNTHROID) 50 MCG tablet Take 50 mcg by mouth daily before breakfast.     linaclotide  (LINZESS ) 290 MCG CAPS capsule TAKE 1 CAPSULE (290 MCG TOTAL) BY MOUTH DAILY BEFORE BREAKFAST. 30 capsule 11   linaclotide  (LINZESS ) 290 MCG CAPS capsule Take 290 mcg by mouth daily before breakfast.     losartan (COZAAR) 100 MG tablet Take 100 mg by mouth daily.     Melatonin 10 MG CAPS Take 1 capsule by mouth as needed.     montelukast  (SINGULAIR ) 10 MG tablet Take 1 tablet (10 mg total) by mouth at bedtime. 30 tablet 5   moxifloxacin (VIGAMOX) 0.5 % ophthalmic solution Place 1 drop into the right eye as directed.     mupirocin  ointment (BACTROBAN ) 2 % Apply 1 Application topically 2 (two) times daily.     nirmatrelvir & ritonavir (PAXLOVID) 20 x 150 MG & 10 x 100MG  TBPK Take 1 tablet by mouth as directed. Take 300 mg nirmatrelvir (two 150 mg tablets) with 100 mg ritonavir (one 100 mg tablet) by mouth twice daily for 5 days.     OZEMPIC, 1 MG/DOSE, 4 MG/3ML SOPN Inject 1 mg into the skin once a week.     promethazine  (PHENERGAN ) 25 MG tablet Take 1 tablet (25 mg total) by mouth every 6 (six) hours as needed. 30 tablet 2   REZDIFFRA 80 MG TABS Take 1 tablet by mouth daily.     rizatriptan (MAXALT-MLT) 10 MG disintegrating tablet DISSOLVE 1 TABLET ON THE TONGUE AS NEEDED FOR MIGRAINE. MAY REPEAT IN 2 HOURS IF NEEDED.     rizatriptan (MAXALT-MLT) 10 MG disintegrating tablet Take 10 mg by mouth as needed for migraine.     rosuvastatin (CRESTOR) 10 MG tablet Take 10 mg  by mouth daily.     rosuvastatin (CRESTOR) 10 MG tablet Take 10 mg by mouth daily.     Semaglutide, 1 MG/DOSE, (OZEMPIC, 1 MG/DOSE,) 4 MG/3ML SOPN Inject 1 mg as directed every 7 days.     sucralfate  (CARAFATE ) 1 GM/10ML suspension Take 10 mLs (1 g total) by mouth 2 (two) times daily. 420 mL 1   Vilazodone HCl (VIIBRYD) 10 MG TABS Take 10 mg by mouth daily.     Vitamin D, Ergocalciferol, (DRISDOL) 1.25 MG (50000 UNIT) CAPS capsule Take 50,000 Units by mouth once a week.     No current facility-administered medications for this visit.    Allergies Allergies  Allergen Reactions   Loratadine  Palpitations, Shortness Of Breath and Swelling    hypoventilation  hypoventilation     hypoventilation  hypoventilation   Wasp Venom Anaphylaxis   Ibuprofen Nausea And Vomiting    GI upset  GI upset    GI upset GI upset   Latex Itching   Sumatriptan Other (See Comments)    sinus pain, sinus on fire and face hurts  Other Reaction(s): Other (See Comments)  sinus pain, sinus on fire and face hurts    REACTION: sinus pain, sinus on fire and face hurts REACTION: sinus pain, sinus on fire and face hurts   Hepatitis A Vaccine     Pt stated it left a large ring around her arm for 2-3 days.    Doxycycline Nausea And Vomiting   Fentanyl  Itching    Allscripts Description: FentaNYL  PT72   Methocarbamol Rash   Tape Other (See Comments)    unknown   Wound Dressing Adhesive     Other Reaction(s): Other (See Comments)  unknown    Histories Past Medical History:  Diagnosis Date   Allergy    Wasps   Asthma    Bronchitis    Depression    Dysrhythmia    PVCs- stress related   Eczema    Family history of adverse reaction to anesthesia    Hypoglycemia    Hypothyroidism    PONV (postoperative nausea and vomiting)    pt prefers scop patch   Renal disorder    2008   Past Surgical History:  Procedure Laterality Date   ABDOMINAL HYSTERECTOMY     ADENOIDECTOMY     BACK SURGERY  07/2009    spinal fusion L 5-S1   BIOPSY  12/07/2018   Procedure: BIOPSY;  Surgeon: Wilhelmenia Aloha Raddle., MD;  Location: [facility];  Service: Gastroenterology;;   BIOPSY  10/25/2020   Procedure: BIOPSY;  Surgeon: Wilhelmenia Aloha Raddle., MD;  Location: [facility];  Service: Gastroenterology;;   BLADDER SUSPENSION N/A 03/11/2014   Procedure: TRANSVAGINAL TAPE (TVT) PROCEDURE;  Surgeon: Krystal Deaner, MD;  Location: [facility];  Service: Gynecology;  Laterality: N/A;   CHOLECYSTECTOMY     ESOPHAGOGASTRODUODENOSCOPY (EGD) WITH PROPOFOL  N/A 12/07/2018   Procedure: ESOPHAGOGASTRODUODENOSCOPY (EGD) WITH PROPOFOL ;  Surgeon: Wilhelmenia Aloha Raddle., MD;  Location: [facility];  Service: Gastroenterology;  Laterality: N/A;   ESOPHAGOGASTRODUODENOSCOPY (EGD) WITH PROPOFOL  N/A 10/25/2020   Procedure: ESOPHAGOGASTRODUODENOSCOPY (EGD) WITH PROPOFOL ;  Surgeon: Wilhelmenia Aloha Raddle., MD;  Location: [facility];  Service: Gastroenterology;  Laterality: N/A;   TONSILLECTOMY AND ADENOIDECTOMY     TYMPANOSTOMY TUBE PLACEMENT     UPPER ESOPHAGEAL ENDOSCOPIC ULTRASOUND (EUS) N/A 10/25/2020   Procedure: UPPER ESOPHAGEAL ENDOSCOPIC ULTRASOUND (EUS);  Surgeon: Wilhelmenia Aloha Raddle., MD;  Location: [facility];  Service: Gastroenterology;  Laterality: N/A;   Social History   Socioeconomic History   Marital status: Divorced    Spouse name: Not on file   Number of children: 3   Years of education: Not on file   Highest education level: Not on file  Occupational History   Occupation: housewife  Tobacco Use   Smoking status: Former    Current packs/day: 0.00    Types: Cigarettes    Quit date: 08/2021    Years since quitting: 2.4   Smokeless tobacco: Never  Vaping Use   Vaping status: Former   Substances: Nicotine, Flavoring  Substance and Sexual Activity   Alcohol  use: Not Currently   Drug use: No   Sexual activity: Yes    Partners: Male    Birth control/protection: Surgical  Other Topics Concern  Not on file  Social History Narrative   Not on file   Social Drivers of Health   Financial Resource Strain: Not on file  Food Insecurity: High Risk (05/03/2022)   Received from [facility]   Hunger Vital Sign    Within the past 12 months, you worried that your food would run out before you got money to buy more: Often true    Within the past 12 months, the food you bought just didn't last and you didn't have money to get more. : Never true  Transportation Needs: Not on file (05/03/2022)  Physical Activity: Not on file  Stress: Not on file  Social Connections: Not on file  Intimate Partner Violence: Not on file   Family History  Problem Relation Age of Onset   Asthma Mother    Allergic rhinitis Mother    Liver disease Mother        Fatty liver, NASH   GI Bleed Mother    Hypothyroidism Mother    Allergic rhinitis Father    Hypothyroidism Sister    Liver disease Sister        fatty liver   Asthma Sister    Allergic rhinitis Sister    Hypothyroidism Sister    Liver disease Sister        fatty liver   Thyroid  cancer Sister    Liver cancer Maternal Grandfather    Liver disease Maternal Grandfather        fatty liver   Colon cancer Neg Hx    Esophageal cancer Neg Hx    Rectal cancer Neg Hx    Inflammatory bowel disease Neg Hx    Pancreatic cancer Neg Hx    Stomach cancer Neg Hx    I have reviewed her medical, social, and family history in detail and updated the electronic medical record as necessary.    PHYSICAL EXAMINATION  There were no vitals taken for this visit. Wt Readings from Last 3 Encounters:  12/01/22 177 lb (80.3 kg)  12/01/22 177 lb (80.3 kg)  09/30/22 180 lb (81.6 kg)  GEN: NAD, appears stated age, seems more cheerful today, non-toxic, accompanied by family member PSYCH: Cooperative, without pressured speech EYE: Conjunctivae pink, sclerae anicteric ENT: MMM CV: Nontachycardic RESP: No audible wheezing GI: NABS, soft, rounded, obese, no  rebound MSK/EXT: Bilateral pedal edema present SKIN: No jaundice, no spider angiomata NEURO:  Alert & Oriented x 3, no focal deficits   REVIEW OF DATA  I reviewed the following data at the time of this encounter:  GI Procedures and Studies  No new studies to review  Laboratory Studies  Reviewed those in epic  Imaging Studies  12/23 Liver U/S IMPRESSION: 1. Prior cholecystectomy. 2. Diffusely increased hepatic parenchymal echogenicity suggestive of steatosis. No focal lesion.   ASSESSMENT  Ms. Audino is a 43 y.o. 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  The patient is seen today for evaluation and management of:  No diagnosis found.  The patient is stable hemodynamically.  I believe that she has functional Nausea/Vomiting, but at this point I would like a second opinion to make sure nothing else is being missed.  She is on Ozempic currently, and although this an exacerbate symptoms, she does not feel it has changed anything, other than helping her lose weight.  I  will be OK for her to continue her Ozempic at current dosing.  Will refer to [facility] to see if there is anything else that we could be missing in her exhaustive workup.  Appreciate [facility] for evaluating patient with high-grade fibrosis.  She is doing the right things with weight loss and this will have impacts to her long-term health.  Will plan to see where things stand in the coming months.  All patient questions were answered to the best of my ability, and the patient agrees to the aforementioned plan of action with follow-up as indicated.   PLAN  Appreciate [facility] monitoring Will plan a 45-month followup Liver U/S and if stable  then plan for yearly U/S thereafter pending how her labs look Appreciate behavioral health continuing to work with patient in regards to her mood -Will allow them to consider additional use of medications such as TCA or BuSpar that could be helpful for her GI issues as well Marinol  2-4 times daily as tolerating Continue Phenergan  as needed Continue Linzess  QD-QOD Second opinion referral to [facility] to see if anything else is being missed in regards to her N/V symptoms (appreciate any further advices)  Volume -Obtain daily standing weight -2000 mg Na diet Infection -No evidence of ascites Bleeding -Varices Encephalopathy -None Screening -Repeat by 5/24 Transplant -Not necessary currently Vaccination -Complete Immunization series today and get follow up serologies at next visit -PCP should consider Influenza, Pneumococcal   No orders of the defined types were placed in this encounter.   New Prescriptions   No medications on file   Modified Medications   No medications on file    Planned Follow Up No follow-ups on file.   Total Time in Face-to-Face and in Coordination of Care for patient including independent/personal interpretation/review of prior testing, medical history, examination, medication adjustment, communicating results with the patient directly, and documentation with the EHR is 30 minutes.   Aloha Finner, MD [facility] Office # [phone]

## 2024-01-30 ENCOUNTER — Telehealth: Payer: Self-pay

## 2024-01-30 ENCOUNTER — Encounter: Payer: Self-pay | Admitting: Gastroenterology

## 2024-01-30 ENCOUNTER — Other Ambulatory Visit (HOSPITAL_COMMUNITY): Payer: Self-pay

## 2024-01-30 DIAGNOSIS — K7581 Nonalcoholic steatohepatitis (NASH): Secondary | ICD-10-CM | POA: Insufficient documentation

## 2024-01-30 DIAGNOSIS — R110 Nausea: Secondary | ICD-10-CM | POA: Insufficient documentation

## 2024-01-30 NOTE — Telephone Encounter (Signed)
 Pharmacy Patient Advocate Encounter  Received notification from [facility] MEDICAID that Prior Authorization for Dexlansoprazole  30MG  dr capsules has been APPROVED from 01-30-2024 to 01-29-2025. Ran test claim, Copay is $4.00. This test claim was processed through [facility]- copay amounts may vary at other pharmacies due to pharmacy/plan contracts, or as the patient moves through the different stages of their insurance plan.   PA #/Case ID/Reference #: [id number]

## 2024-01-30 NOTE — Telephone Encounter (Signed)
 Pharmacy Patient Advocate Encounter   Received notification from CoverMyMeds that prior authorization for Dexilant  30MG  dr capsules is required/requested.   Insurance verification completed.   The patient is insured through [facility] MEDICAID.   Per test claim: PA required; PA submitted to above mentioned insurance via Latent Key/confirmation #/EOC [id number] Status is pending

## 2024-01-30 NOTE — Telephone Encounter (Signed)
 Noted pt. advised

## 2024-02-05 ENCOUNTER — Telehealth: Payer: Self-pay | Admitting: Gastroenterology

## 2024-02-05 NOTE — Telephone Encounter (Signed)
 Tried returning call to patient to see if she was referring to Scopolamine  Transderm Patches. Not sure what  strep patch she is asking for, however we have never prescribed strep patches for her. She may need to contact her PCP office. Phone just rings and not able to leave message.

## 2024-02-05 NOTE — Telephone Encounter (Signed)
 Inbound call from patient states she is going out of town . She is requesting a medication refill for a 'strep patch. Please advise.

## 2024-02-06 NOTE — Telephone Encounter (Signed)
 Patient returning call Requesting a call back  Please advise  Thank you

## 2024-02-07 MED ORDER — OMEPRAZOLE 40 MG PO CPDR: 40.0000 mg | DELAYED_RELEASE_CAPSULE | Freq: Every day | ORAL | 6 refills | Status: AC

## 2024-02-07 MED ORDER — SCOPOLAMINE 1 MG/3DAYS TD PT72: 1.0000 | MEDICATED_PATCH | TRANSDERMAL | 0 refills | Status: AC

## 2024-02-07 NOTE — Telephone Encounter (Signed)
 I am OK with both of these requests. Omeprazole  40 mg every day (30/6) for now. Scopolamine  patches change every 72 hours (10/0) Thanks. GM

## 2024-02-07 NOTE — Telephone Encounter (Signed)
 Patient called states that her Dexilant  needs PA before she can pick it up. Patient would like for Omeprazole  40 mg once daily to be sent until we can get approval for Dexilant . Also patient is requesting refill on Scopolamine - transderm-scop patches as she is going out of town this weekend and would like to have for car ride if possible. Please advise if this okay?

## 2024-02-11 ENCOUNTER — Ambulatory Visit (INDEPENDENT_AMBULATORY_CARE_PROVIDER_SITE_OTHER): Admitting: Behavioral Health

## 2024-02-11 DIAGNOSIS — F411 Generalized anxiety disorder: Secondary | ICD-10-CM

## 2024-02-11 NOTE — Progress Notes (Signed)
 [facility] Counselor/Therapist Progress Note  Patient ID: Angie Brown, MRN: [id number],    Date: 02/11/2024  Time Spent: 50 min Caregility video; Pt is home in private & Provider working remotely from [employer]. Pt is aware of the risks/limitations of telehealth & consents to Tx today. Time In: 2:00pm Time Out: 2:50pm   Treatment Type: Individual Therapy  Reported Symptoms: Elevated anx/dep & stress due to lack of friendships & contacts in the Community  Mental Status Exam: Appearance:  Casual     Behavior: Appropriate and Sharing  Motor: Normal  Speech/Language:  Clear and Coherent  Affect: Appropriate  Mood: angry and anxious  Thought process: normal  Thought content:   WNL  Sensory/Perceptual disturbances:   WNL  Orientation: oriented to person, place, time/date, and situation  Attention: Good  Concentration: Good and Fair  Memory: WNL  Fund of knowledge:  Good  Insight:   Good  Judgment:  Good  Impulse Control: Good   Risk Assessment: Danger to Self:  No Self-injurious Behavior: No Danger to Others: No Duty to Warn:no Physical Aggression / Violence:No  Access to Firearms a concern: No  Gang Involvement:No   Subjective: Pt's Dtr has been in [place] over the rohm and haas she has been lft to watch the dog. The dog is high energy & Dtr will not provide her w/enough exercise.   She has been feeling lft out of activities & interactions w/her children. Ex-Husb is also part of the problem.   Her weight is bothering her since gaining 30#. She is exp'g several health status changes. She has pared down her medications to those essential.   Interventions: Family Systems  Diagnosis:Generalized anxiety disorder  Plan: Angie Brown lacking anyone she can relate to in the World. She is lonely. She spends much time alone. She has no job & this makes her depressed even further. She will try to find a companion on [employer] Dating. She will try to go to a simple Painting  activity.   Target Date: 03/03/2024  Progress: 5  Frequency: Once every 2-3 wks  Modality: [name], LMFT

## 2024-02-28 ENCOUNTER — Ambulatory Visit: Admitting: Behavioral Health

## 2024-02-28 NOTE — Progress Notes (Unsigned)
 [facility] Counselor/Therapist Progress Note  Patient ID: Angie Brown, MRN: [id number],    Date: 02/28/2024  Time Spent: 45 min Caregility video; Pt is home in private & Provider working remotely from [employer]. Pt is aware of the risks/limitations of telehealth & consents to Tx today. Time In: 1:00pm Time Out: 1:45pm   Treatment Type: Individual Therapy  Reported Symptoms: ***  Mental Status Exam: Appearance:  Casual     Behavior: Appropriate and Sharing  Motor: Normal  Speech/Language:  Clear and Coherent  Affect: Appropriate  Mood: normal  Thought process: normal  Thought content:   WNL  Sensory/Perceptual disturbances:   WNL  Orientation: oriented to person, place, time/date, and situation  Attention: Good  Concentration: Good  Memory: WNL  Fund of knowledge:  Good  Insight:   Fair  Judgment:  Good  Impulse Control: Good   Risk Assessment: Danger to Self:  No Self-injurious Behavior: No Danger to Others: No Duty to Warn:no Physical Aggression / Violence:No  Access to Firearms a concern: No  Gang Involvement:No   Subjective: Pt sts she ___________________   Interventions: {[phone]}  Diagnosis:Generalized anxiety disorder  Plan: Angie Brown will __________________  Target Date:   Progress:  Frequency:  Modality:  Angie LITTIE Ling, LMFT

## 2024-03-03 ENCOUNTER — Ambulatory Visit: Admitting: Behavioral Health

## 2024-03-04 ENCOUNTER — Ambulatory Visit: Admitting: Behavioral Health

## 2024-03-04 DIAGNOSIS — F4329 Adjustment disorder with other symptoms: Secondary | ICD-10-CM

## 2024-03-04 DIAGNOSIS — F411 Generalized anxiety disorder: Secondary | ICD-10-CM | POA: Diagnosis not present

## 2024-03-04 NOTE — Progress Notes (Addendum)
 [facility] Counselor/Therapist Progress Note  Patient ID: Angie Brown, MRN: [id number],    Date: 03/04/2024  Time Spent: 50 min Caregility video; Pt is home in private & Provider working remotely from [employer]. Pt is aware of the risks/limitations of telehealth & consents to Tx today. Time In: 1:00pm Time Out: 1:50pm   Treatment Type: Individual Therapy  Reported Symptoms: Elevated anx/dep & stress  Mental Status Exam: Appearance:  Casual     Behavior: Appropriate and Sharing  Motor: Normal  Speech/Language:  Clear and Coherent  Affect: Appropriate  Mood: normal  Thought process: normal  Thought content:   WNL  Sensory/Perceptual disturbances:   WNL  Orientation: oriented to person, place, time/date, and situation  Attention: Good  Concentration: Good  Memory: WNL  Fund of knowledge:  Good  Insight:   Good  Judgment:  Good  Impulse Control: Good   Risk Assessment: Danger to Self:  No Self-injurious Behavior: No Danger to Others: No Duty to Warn:no Physical Aggression / Violence:No  Access to Firearms a concern: No  Gang Involvement:No   Subjective: Pt has had one Son visit this past wknd. She has found out her other Son has gotten married; last year in June 2024. She does not know the female Partner. Her grown Adult Children have treated her badly in this Holiday Season. She is questioning her parenting & how she is currently being treated.   Angie Brown spent time w/her Angie Brown over the wknd.    Pt has been surprised w/the decisions her children have made in the recent past. She wants to be a part of their lives.   Interventions: Family Systems and Interpersonal  Diagnosis:Generalized anxiety disorder  Grief reaction with prolonged bereavement  Plan: Angie Brown will try to approach her children more gently in the 2026 year. She feels the distance btwn them as they mature & age. We will discuss the trajectory of Family dvlpmnt in more depth in the  New Year.  Target Date: 04/03/2024  Progress: 5  Frequency: Once every 2-3 wks  Modality: Angie Richerd LITTIE Hollace, LMFT

## 2024-03-27 ENCOUNTER — Ambulatory Visit: Admitting: Behavioral Health

## 2024-04-04 ENCOUNTER — Other Ambulatory Visit: Payer: Self-pay | Admitting: Gastroenterology

## 2024-05-01 ENCOUNTER — Ambulatory Visit: Admitting: Behavioral Health
